# Patient Record
Sex: Male | Born: 1955
Health system: Southern US, Community
[De-identification: ages and names within clinical notes are randomized; demographics above are authoritative.]

## PROBLEM LIST (undated history)

## (undated) DIAGNOSIS — K219 Gastro-esophageal reflux disease without esophagitis: Secondary | ICD-10-CM

## (undated) DIAGNOSIS — I2510 Atherosclerotic heart disease of native coronary artery without angina pectoris: Secondary | ICD-10-CM

## (undated) DIAGNOSIS — E785 Hyperlipidemia, unspecified: Secondary | ICD-10-CM

## (undated) DIAGNOSIS — J329 Chronic sinusitis, unspecified: Secondary | ICD-10-CM

## (undated) DIAGNOSIS — B9689 Other specified bacterial agents as the cause of diseases classified elsewhere: Secondary | ICD-10-CM

## (undated) DIAGNOSIS — Z9889 Other specified postprocedural states: Secondary | ICD-10-CM

## (undated) DIAGNOSIS — T7840XA Allergy, unspecified, initial encounter: Secondary | ICD-10-CM

## (undated) HISTORY — DX: Allergy, unspecified, initial encounter: T78.40XA

## (undated) HISTORY — DX: Hyperlipidemia, unspecified: E78.5

## (undated) HISTORY — DX: Atherosclerotic heart disease of native coronary artery without angina pectoris: I25.10

## (undated) HISTORY — PX: SHOULDER SURGERY: SHX246

---

## 2001-07-13 ENCOUNTER — Encounter: Admission: RE | Admit: 2001-07-13 | Discharge: 2001-07-13 | Payer: Self-pay | Admitting: Internal Medicine

## 2001-07-13 ENCOUNTER — Encounter: Payer: Self-pay | Admitting: Internal Medicine

## 2001-11-09 ENCOUNTER — Ambulatory Visit (HOSPITAL_COMMUNITY): Admission: RE | Admit: 2001-11-09 | Discharge: 2001-11-09 | Payer: Self-pay | Admitting: Specialist

## 2006-10-04 ENCOUNTER — Ambulatory Visit: Payer: Self-pay | Admitting: Family Medicine

## 2006-10-04 LAB — CONVERTED CEMR LAB
ALT: 32 units/L (ref 0–40)
AST: 23 units/L (ref 0–37)
Chol/HDL Ratio, serum: 2.5
Cholesterol: 168 mg/dL (ref 0–200)
HDL: 68 mg/dL (ref >39.0)
LDL Cholesterol: 84 mg/dL (ref 0–99)
Triglyceride fasting, serum: 81 mg/dL (ref 0–149)
VLDL: 16 mg/dL (ref 0–40)

## 2007-12-17 ENCOUNTER — Ambulatory Visit: Payer: Self-pay | Admitting: Family Medicine

## 2007-12-17 DIAGNOSIS — J019 Acute sinusitis, unspecified: Secondary | ICD-10-CM | POA: Insufficient documentation

## 2007-12-21 ENCOUNTER — Ambulatory Visit: Payer: Self-pay | Admitting: Family Medicine

## 2007-12-24 ENCOUNTER — Ambulatory Visit: Payer: Self-pay | Admitting: Family Medicine

## 2007-12-24 ENCOUNTER — Encounter (INDEPENDENT_AMBULATORY_CARE_PROVIDER_SITE_OTHER): Payer: Self-pay | Admitting: *Deleted

## 2007-12-24 DIAGNOSIS — F528 Other sexual dysfunction not due to a substance or known physiological condition: Secondary | ICD-10-CM

## 2007-12-24 DIAGNOSIS — E785 Hyperlipidemia, unspecified: Secondary | ICD-10-CM | POA: Insufficient documentation

## 2007-12-24 LAB — CONVERTED CEMR LAB
ALT: 31 units/L (ref 0–53)
AST: 19 units/L (ref 0–37)
Cholesterol: 158 mg/dL (ref 0–200)
Glucose, Bld: 93 mg/dL (ref 70–99)
HDL: 52 mg/dL (ref >39.0)
LDL Cholesterol: 91 mg/dL (ref 0–99)
PSA: 0.84 ng/mL (ref 0.10–4.00)
Total CHOL/HDL Ratio: 3
Triglycerides: 77 mg/dL (ref 0–149)
VLDL: 15 mg/dL (ref 0–40)

## 2008-07-31 ENCOUNTER — Telehealth: Payer: Self-pay | Admitting: Family Medicine

## 2008-12-26 ENCOUNTER — Ambulatory Visit: Payer: Self-pay | Admitting: Family Medicine

## 2009-03-06 ENCOUNTER — Ambulatory Visit: Payer: Self-pay | Admitting: Family Medicine

## 2009-03-06 LAB — CONVERTED CEMR LAB
ALT: 41 units/L (ref 0–53)
AST: 25 units/L (ref 0–37)
Albumin: 4.1 g/dL (ref 3.5–5.2)
Alkaline Phosphatase: 61 units/L (ref 39–117)
BUN: 22 mg/dL (ref 6–23)
Basophils Absolute: 0 10*3/uL (ref 0.0–0.1)
Basophils Relative: 0.4 % (ref 0.0–3.0)
Bilirubin, Direct: 0 mg/dL (ref 0.0–0.3)
CO2: 30 meq/L (ref 19–32)
Calcium: 9.3 mg/dL (ref 8.4–10.5)
Chloride: 106 meq/L (ref 96–112)
Cholesterol: 253 mg/dL — ABNORMAL HIGH (ref 0–200)
Creatinine, Ser: 1.1 mg/dL (ref 0.4–1.5)
Direct LDL: 164.5 mg/dL
Eosinophils Absolute: 0.1 10*3/uL (ref 0.0–0.7)
Eosinophils Relative: 3.1 % (ref 0.0–5.0)
GFR calc non Af Amer: 75.12 mL/min (ref >60)
Glucose, Bld: 94 mg/dL (ref 70–99)
HCT: 43.1 % (ref 39.0–52.0)
HDL: 66 mg/dL (ref >39.00)
Hemoglobin: 14.7 g/dL (ref 13.0–17.0)
Lymphocytes Relative: 36.2 % (ref 12.0–46.0)
Lymphs Abs: 1.4 10*3/uL (ref 0.7–4.0)
MCHC: 34.1 g/dL (ref 30.0–36.0)
MCV: 90.7 fL (ref 78.0–100.0)
Monocytes Absolute: 0.4 10*3/uL (ref 0.1–1.0)
Monocytes Relative: 9.6 % (ref 3.0–12.0)
Neutro Abs: 1.9 10*3/uL (ref 1.4–7.7)
Neutrophils Relative %: 50.7 % (ref 43.0–77.0)
PSA: 0.72 ng/mL (ref 0.10–4.00)
Platelets: 231 10*3/uL (ref 150.0–400.0)
Potassium: 4.7 meq/L (ref 3.5–5.1)
RBC: 4.75 M/uL (ref 4.22–5.81)
RDW: 11.8 % (ref 11.5–14.6)
Sodium: 141 meq/L (ref 135–145)
TSH: 1.41 microintl units/mL (ref 0.35–5.50)
Total Bilirubin: 1 mg/dL (ref 0.3–1.2)
Total CHOL/HDL Ratio: 4
Total Protein: 6.9 g/dL (ref 6.0–8.3)
Triglycerides: 94 mg/dL (ref 0.0–149.0)
VLDL: 18.8 mg/dL (ref 0.0–40.0)
WBC: 3.8 10*3/uL — ABNORMAL LOW (ref 4.5–10.5)

## 2009-03-09 ENCOUNTER — Encounter (INDEPENDENT_AMBULATORY_CARE_PROVIDER_SITE_OTHER): Payer: Self-pay | Admitting: *Deleted

## 2009-03-09 ENCOUNTER — Telehealth (INDEPENDENT_AMBULATORY_CARE_PROVIDER_SITE_OTHER): Payer: Self-pay | Admitting: *Deleted

## 2009-03-23 ENCOUNTER — Ambulatory Visit: Payer: Self-pay | Admitting: Internal Medicine

## 2009-04-08 ENCOUNTER — Ambulatory Visit: Payer: Self-pay | Admitting: Internal Medicine

## 2009-04-08 HISTORY — PX: COLONOSCOPY: SHX174

## 2009-11-26 ENCOUNTER — Ambulatory Visit: Payer: Self-pay | Admitting: Family Medicine

## 2010-04-27 ENCOUNTER — Telehealth (INDEPENDENT_AMBULATORY_CARE_PROVIDER_SITE_OTHER): Payer: Self-pay | Admitting: *Deleted

## 2010-05-07 ENCOUNTER — Ambulatory Visit: Payer: Self-pay | Admitting: Family Medicine

## 2010-05-10 LAB — CONVERTED CEMR LAB
ALT: 41 units/L (ref 0–53)
AST: 25 units/L (ref 0–37)
Albumin: 4 g/dL (ref 3.5–5.2)
Alkaline Phosphatase: 58 units/L (ref 39–117)
BUN: 21 mg/dL (ref 6–23)
Basophils Absolute: 0 10*3/uL (ref 0.0–0.1)
Basophils Relative: 0.5 % (ref 0.0–3.0)
Bilirubin, Direct: 0.1 mg/dL (ref 0.0–0.3)
CO2: 32 meq/L (ref 19–32)
Calcium: 9.5 mg/dL (ref 8.4–10.5)
Chloride: 108 meq/L (ref 96–112)
Cholesterol: 225 mg/dL — ABNORMAL HIGH (ref 0–200)
Creatinine, Ser: 1.2 mg/dL (ref 0.4–1.5)
Direct LDL: 132.2 mg/dL
Eosinophils Absolute: 0.1 10*3/uL (ref 0.0–0.7)
Eosinophils Relative: 1.5 % (ref 0.0–5.0)
GFR calc non Af Amer: 71.04 mL/min (ref >60)
Glucose, Bld: 99 mg/dL (ref 70–99)
HCT: 41.9 % (ref 39.0–52.0)
HDL: 78.3 mg/dL (ref >39.00)
Hemoglobin: 14.6 g/dL (ref 13.0–17.0)
Lymphocytes Relative: 26.2 % (ref 12.0–46.0)
Lymphs Abs: 1.5 10*3/uL (ref 0.7–4.0)
MCHC: 34.8 g/dL (ref 30.0–36.0)
MCV: 90.6 fL (ref 78.0–100.0)
Monocytes Absolute: 0.3 10*3/uL (ref 0.1–1.0)
Monocytes Relative: 6.2 % (ref 3.0–12.0)
Neutro Abs: 3.7 10*3/uL (ref 1.4–7.7)
Neutrophils Relative %: 65.6 % (ref 43.0–77.0)
PSA: 0.81 ng/mL (ref 0.10–4.00)
Platelets: 245 10*3/uL (ref 150.0–400.0)
Potassium: 5.1 meq/L (ref 3.5–5.1)
RBC: 4.63 M/uL (ref 4.22–5.81)
RDW: 13.1 % (ref 11.5–14.6)
Sodium: 144 meq/L (ref 135–145)
TSH: 1.59 microintl units/mL (ref 0.35–5.50)
Total Bilirubin: 0.5 mg/dL (ref 0.3–1.2)
Total CHOL/HDL Ratio: 3
Total Protein: 6.6 g/dL (ref 6.0–8.3)
Triglycerides: 95 mg/dL (ref 0.0–149.0)
VLDL: 19 mg/dL (ref 0.0–40.0)
WBC: 5.6 10*3/uL (ref 4.5–10.5)

## 2010-05-17 ENCOUNTER — Telehealth (INDEPENDENT_AMBULATORY_CARE_PROVIDER_SITE_OTHER): Payer: Self-pay | Admitting: *Deleted

## 2010-11-30 ENCOUNTER — Telehealth (INDEPENDENT_AMBULATORY_CARE_PROVIDER_SITE_OTHER): Payer: Self-pay | Admitting: *Deleted

## 2010-12-03 ENCOUNTER — Other Ambulatory Visit: Payer: Self-pay | Admitting: Family Medicine

## 2010-12-03 ENCOUNTER — Ambulatory Visit
Admission: RE | Admit: 2010-12-03 | Discharge: 2010-12-03 | Payer: Self-pay | Source: Home / Self Care | Attending: Family Medicine | Admitting: Family Medicine

## 2010-12-03 LAB — LIPID PANEL
Cholesterol: 192 mg/dL (ref 0–200)
HDL: 69.5 mg/dL (ref >39.00)
LDL Cholesterol: 109 mg/dL — ABNORMAL HIGH (ref 0–99)
Total CHOL/HDL Ratio: 3
Triglycerides: 68 mg/dL (ref 0.0–149.0)
VLDL: 13.6 mg/dL (ref 0.0–40.0)

## 2010-12-28 NOTE — Progress Notes (Signed)
Summary: CALL TO EXPLAIN LAB NUMBERS FROM 6/10  Phone Note Call from Patient Call back at Work Phone [phone]   Caller: Patient Summary of Call: PLEASE CALL PATIENT TO GO OVER HIS LAB WORK DATED 6/10----HE DOESNT UNDERSTAND WHAT THE NUMBERS MEAN Initial call taken by: Jerolyn Shin,  May 17, 2010 10:08 AM  Follow-up for Phone Call        spoke w/ patient explained copy of labs.....Marland KitchenMarland KitchenDoristine Devoid  May 18, 2010 1:16 PM     Prescriptions: SIMVASTATIN 20 MG TABS (SIMVASTATIN) take one tablet at bedtime  #90 x 1   Entered by:   Doristine Devoid   Authorized by:   Neena Rhymes MD   Signed by:   Doristine Devoid on 05/18/2010   Method used:   Faxed to ...       [facility] (mail-order)       [address] [place], [place]  [address]       Ph: [phone]       Fax: [phone]   RxID:   [id number]

## 2010-12-28 NOTE — Assessment & Plan Note (Signed)
Summary: CHOLESTEROL RECHECK/CDJ   Vital Signs:  Patient profile:   55 year old male Height:      70.50 inches Weight:      188 pounds BMI:     26.69 Pulse rate:   78 / minute BP sitting:   130 / 80  (left arm)  Vitals Entered By: Doristine Devoid (May 07, 2010 9:24 AM) CC: roa and lab   History of Present Illness: 55 yo man here today for f/u on  1) cholesterol- taking Simvastatin w/out any N/V, abd pain, myalgias.  exercising, has lost 8 lbs since 12/10.  not following any particular diet.  overdue for CPE- will collect labs.  Current Medications (verified): 1)  Simvastatin 20 Mg Tabs (Simvastatin) .... Take One Tablet At Bedtime 2)  Adprin B 325 Mg  Tabs (Aspirin Buf(Cacarb-Mgcarb-Mgo)) .... Daily 3)  Fish Oil Concentrate 300 Mg  Caps (Omega-3 Fatty Acids) 4)  Flonase 50 Mcg/act Susp (Fluticasone Propionate) .... Use 2 Sprays in Each Nostril Daily  Allergies (verified): No Known Drug Allergies  Past History:  Social History: Last updated: 05/07/2010 Occupation: Airline pilot Married Never Smoked Alcohol use-yes Drug use-no Regular exercise-yes: plays tennis regularly  Social History: Occupation: Airline pilot Married Never Smoked Alcohol use-yes Drug use-no Regular exercise-yes: plays tennis regularly  Review of Systems      See HPI  Physical Exam  General:  Well-developed,well-nourished,in no acute distress; alert,appropriate and cooperative throughout examination Lungs:  Normal respiratory effort, chest expands symmetrically. Lungs are clear to auscultation, no crackles or wheezes. Heart:  Normal rate and regular rhythm. S1 and S2 normal without gallop, murmur, click, rub or other extra sounds. Extremities:  No clubbing, cyanosis, edema   Impression & Recommendations:  Problem # 1:  HYPERLIPIDEMIA (ICD-272.4) Assessment Unchanged due for labs.  has lost weight, exercising regularly.  adjust meds as needed. His updated medication list for this problem includes:   Simvastatin 20 Mg Tabs (Simvastatin) .Marland Kitchen... Take one tablet at bedtime  Orders: Venipuncture (16109) TLB-Lipid Panel (80061-LIPID) TLB-Hepatic/Liver Function Pnl (80076-HEPATIC)  Problem # 2:  PREVENTIVE HEALTH CARE (ICD-V70.0) Assessment: Unchanged overdue for CPE.  collect labs for upcoming appt Orders: TLB-BMP (Basic Metabolic Panel-BMET) (80048-METABOL) TLB-CBC Platelet - w/Differential (85025-CBCD) TLB-TSH (Thyroid Stimulating Hormone) (84443-TSH) TLB-PSA (Prostate Specific Antigen) (84153-PSA)  Complete Medication List: 1)  Simvastatin 20 Mg Tabs (Simvastatin) .... Take one tablet at bedtime 2)  Adprin B 325 Mg Tabs (Aspirin buf(cacarb-mgcarb-mgo)) .... Daily 3)  Fish Oil Concentrate 300 Mg Caps (Omega-3 fatty acids) 4)  Flonase 50 Mcg/act Susp (Fluticasone propionate) .... Use 2 sprays in each nostril daily  Patient Instructions: 1)  Please schedule your complete physical at your convenience- you can eat before this appt since we have your labs 2)  We'll notify you of your labs 3)  Call with any questions or concerns 4)  Enjoy your summer!

## 2010-12-28 NOTE — Progress Notes (Signed)
Summary: 2 PRESCRIPTIONS FOR CRESTOR--30 DAY AND 90 DAY  Phone Note Call from Patient Call back at CELL - [phone]   Caller: Patient Summary of Call: PATIENT BROUGHT IN [facility] FORM FOR HIS CRESTOR FOR A 90 DAY PRESCRIPTION PLUS REFILLS  PATIENT WILL TAKE HIS LAST PILL TODAY---CAN HE GET A 30 DAY PRESCRIPTION CALLED INTO THE [facility] ON [address]?  PLEASE CALL HIM ON CELL [phone] AND LEAVE MESSAGE WHEN 30 DAY LOCAL SUPPLY HAS BEEN CALLED IN  WILL TAKE FORM TO CHEMIRA IN PLASTIC SLEEVE  Initial call taken by: Jerolyn Shin,  Apr 27, 2010 10:39 AM  Follow-up for Phone Call        spoke w/ patient aware prescription to be called into pharmacy informed that he is due for f/u on cholesterol and also need fasting labs and cpx but patient would like to just schedule appt to recheck labs and come back at another date for cpx........Marland KitchenDoristine Devoid  Apr 27, 2010 2:01 PM      Appended Document: 2 PRESCRIPTIONS FOR CRESTOR--30 DAY AND 90 DAY    Clinical Lists Changes  Medications: Rx of SIMVASTATIN 20 MG TABS (SIMVASTATIN) take one tablet at bedtime;  #30 x 0;  Signed;  Entered by: Doristine Devoid;  Authorized by: Neena Rhymes MD;  Method used: Electronically to [facility] [phone]*, [address] [place], [place]  [address], Ph: [phone], Fax: [phone]    Prescriptions: SIMVASTATIN 20 MG TABS (SIMVASTATIN) take one tablet at bedtime  #30 x 0   Entered by:   Doristine Devoid   Authorized by:   Neena Rhymes MD   Signed by:   Doristine Devoid on 04/27/2010   Method used:   Electronically to        [facility] [phone]* (retail)       [address]       [place], [place]  [address]       Ph: [phone]       Fax: [phone]   RxID:   [id number]

## 2010-12-30 NOTE — Progress Notes (Signed)
Summary: Refill Request  Phone Note Refill Request Message from:  Patient on November 30, 2010 1:51 PM  Refills Requested: Medication #1:  SIMVASTATIN 20 MG TABS take one tablet at bedtime   Dosage confirmed as above?Dosage Confirmed   Supply Requested: 1 month [facility] on [address]   Next Appointment Scheduled: none Initial call taken by: Harold Barban,  November 30, 2010 1:51 PM    Prescriptions: SIMVASTATIN 20 MG TABS (SIMVASTATIN) take one tablet at bedtime  #90 x 1   Entered by:   Doristine Devoid CMA   Authorized by:   Neena Rhymes MD   Signed by:   Doristine Devoid CMA on 11/30/2010   Method used:   Electronically to        [facility] [phone]* (retail)       [address]       [place], [place]  [address]       Ph: [phone]       Fax: [phone]   RxID:   [id number]

## 2011-01-28 ENCOUNTER — Encounter (INDEPENDENT_AMBULATORY_CARE_PROVIDER_SITE_OTHER): Payer: BC Managed Care – PPO | Admitting: Family Medicine

## 2011-01-28 ENCOUNTER — Other Ambulatory Visit: Payer: Self-pay | Admitting: Family Medicine

## 2011-01-28 ENCOUNTER — Encounter: Payer: Self-pay | Admitting: Family Medicine

## 2011-01-28 DIAGNOSIS — Z0000 Encounter for general adult medical examination without abnormal findings: Secondary | ICD-10-CM

## 2011-01-28 LAB — BASIC METABOLIC PANEL
BUN: 21 mg/dL (ref 6–23)
GFR: 70.83 mL/min (ref >60.00)
Potassium: 4.9 mEq/L (ref 3.5–5.1)
Sodium: 141 mEq/L (ref 135–145)

## 2011-01-28 LAB — CBC WITH DIFFERENTIAL/PLATELET
Eosinophils Relative: 2.8 % (ref 0.0–5.0)
HCT: 41.9 % (ref 39.0–52.0)
Lymphs Abs: 1.4 10*3/uL (ref 0.7–4.0)
Monocytes Relative: 7.6 % (ref 3.0–12.0)
Platelets: 251 10*3/uL (ref 150.0–400.0)
RBC: 4.71 Mil/uL (ref 4.22–5.81)
WBC: 4.5 10*3/uL (ref 4.5–10.5)

## 2011-01-28 LAB — TSH: TSH: 1.67 u[IU]/mL (ref 0.35–5.50)

## 2011-01-28 LAB — PSA: PSA: 0.82 ng/mL (ref 0.10–4.00)

## 2011-02-01 ENCOUNTER — Encounter: Payer: Self-pay | Admitting: Family Medicine

## 2011-02-08 NOTE — Assessment & Plan Note (Signed)
Summary: cpe/kn   Vital Signs:  Patient profile:   55 year old male Height:      70.50 inches (179.07 cm) Weight:      197.50 pounds (89.77 kg) BMI:     28.04 Temp:     98.1 degrees F (36.72 degrees C) oral BP sitting:   112 / 78  (left arm) Cuff size:   regular  Vitals Entered By: Lucious Groves CMA (January 28, 2011 8:35 AM) CC: Fasting CPX./kb Is Patient Diabetic? No Pain Assessment Patient in pain? no      Comments Patient denies having any questions or concerns.   History of Present Illness: 55 yo man here today for CPE.  no concerns.  UTD on colonoscopy.  Preventive Screening-Counseling & Management  Alcohol-Tobacco     Alcohol drinks/day: <1     Smoking Status: never  Caffeine-Diet-Exercise     Does Patient Exercise: yes     Type of exercise: tennis, running      Sexual History:  currently monogamous.        Drug Use:  never.    Current Medications (verified): 1)  Simvastatin 20 Mg Tabs (Simvastatin) .... Take One Tablet At Bedtime 2)  Adprin B 325 Mg  Tabs (Aspirin Buf(Cacarb-Mgcarb-Mgo)) .... Daily 3)  Fish Oil Concentrate 300 Mg  Caps (Omega-3 Fatty Acids) 4)  Flonase 50 Mcg/act Susp (Fluticasone Propionate) .... Use 2 Sprays in Each Nostril Daily  Allergies (verified): No Known Drug Allergies  Past History:  Past medical, surgical, family and social histories (including risk factors) reviewed, and no changes noted (except as noted below).  Past Medical History: Reviewed history from 12/24/2007 and no changes required. Hyperlipidemia Urinary incontinence  Past Surgical History: Reviewed history from 12/24/2007 and no changes required. right shoulder surgery  Family History: Reviewed history from 12/24/2007 and no changes required. CAD: father  MI at 25: patient had a stress test at 37 alzheimer Colon Cancer- none Prostate Cancer- none DM- none  Social History: Reviewed history from 05/07/2010 and no changes required. 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  The patient denies anorexia, fever, weight loss, weight gain, vision loss, decreased hearing, hoarseness, chest pain, syncope, dyspnea on exertion, peripheral edema, prolonged cough, headaches, abdominal pain, melena, hematochezia, severe indigestion/heartburn, hematuria, suspicious skin lesions, depression, enlarged lymph nodes, and testicular masses.         frequent nose bleeds- has scab in L nostril  Physical Exam  General:  Well-developed,well-nourished,in no acute distress; alert,appropriate and cooperative throughout examination Head:  Normocephalic and atraumatic without obvious abnormalities. Eyes:  No corneal or conjunctival inflammation noted. EOMI. Perrla. Funduscopic exam benign, without hemorrhages, exudates or papilledema. Vision grossly normal. Ears:  External ear exam shows no significant lesions or deformities.  Otoscopic examination reveals clear canals, tympanic membranes are intact bilaterally without bulging, retraction, inflammation or discharge. Hearing is grossly normal bilaterally. Nose:  scab in anterior L nostril along medial wall Mouth:  Oral mucosa and oropharynx without lesions or exudates.  Teeth in good repair. Neck:  No deformities, masses, or tenderness noted. Lungs:  Normal respiratory effort, chest expands symmetrically. Lungs are clear to auscultation, no crackles or wheezes. Heart:  Normal rate and regular rhythm. S1 and S2 normal without gallop, murmur, click, rub or other extra sounds. Abdomen:  Bowel sounds positive,abdomen soft and non-tender without masses, organomegaly or  hernias noted. Rectal:  + external hemorrhoids. Normal sphincter tone. No rectal masses or tenderness. Genitalia:  Testes bilaterally descended without nodularity, tenderness or masses. No scrotal masses or  lesions. No penis lesions or urethral discharge. Prostate:  Prostate gland firm and smooth, no enlargement, nodularity, tenderness, mass, asymmetry or induration. Msk:  No deformity or scoliosis noted of thoracic or lumbar spine.   Pulses:  +2 carotid, radial, DP Extremities:  No clubbing, cyanosis, edema Neurologic:  No cranial nerve deficits noted. Station and gait are normal. Plantar reflexes are down-going bilaterally. DTRs are symmetrical throughout. Sensory, motor and coordinative functions appear intact. Skin:  Intact without suspicious lesions or rashes Cervical Nodes:  No lymphadenopathy noted Inguinal Nodes:  No significant adenopathy Psych:  Cognition and judgment appear intact. Alert and cooperative with normal attention span and concentration. No apparent delusions, illusions, hallucinations   Impression & Recommendations:  Problem # 1:  PREVENTIVE HEALTH CARE (ICD-V70.0) Assessment Unchanged pt's PE WNL.  UTD on health maintainence.  EKG done as baseline.  check labs.  anticipatory guidance provided. Orders: Venipuncture (52841) EKG w/ Interpretation (93000) Specimen Handling (32440) TLB-BMP (Basic Metabolic Panel-BMET) (80048-METABOL) TLB-CBC Platelet - w/Differential (85025-CBCD) TLB-TSH (Thyroid Stimulating Hormone) (84443-TSH) TLB-PSA (Prostate Specific Antigen) (84153-PSA)  Complete Medication List: 1)  Simvastatin 20 Mg Tabs (Simvastatin) .... Take one tablet at bedtime 2)  Adprin B 325 Mg Tabs (Aspirin buf(cacarb-mgcarb-mgo)) .... Daily 3)  Fish Oil Concentrate 300 Mg Caps (Omega-3 fatty acids) 4)  Flonase 50 Mcg/act Susp (Fluticasone propionate) .... Use 2 sprays in each nostril daily  Patient Instructions: 1)  Schedule a lab visit in 6 months to recheck cholesterol 2)  We'll notify you of your lab results 3)  Call with any questions or concerns 4)  Your exam looks great! 5)  Have a great weekend! Prescriptions: FLONASE 50 MCG/ACT SUSP (FLUTICASONE  PROPIONATE) Use 2 sprays in each nostril daily  #1 x 5   Entered by:   Lucious Groves CMA   Authorized by:   Neena Rhymes MD   Signed by:   Lucious Groves CMA on 01/28/2011   Method used:   Electronically to        [facility] [address] [phone]* (retail)       [address]       [place], [place]  [address]       Ph: [phone]       Fax: [phone]   RxID:   [id number]    Orders Added: 1)  Venipuncture [10932] 2)  EKG w/ Interpretation [93000] 3)  Specimen Handling [99000] 4)  TLB-BMP (Basic Metabolic Panel-BMET) [80048-METABOL] 5)  TLB-CBC Platelet - w/Differential [85025-CBCD] 6)  TLB-TSH (Thyroid Stimulating Hormone) [84443-TSH] 7)  TLB-PSA (Prostate Specific Antigen) [84153-PSA] 8)  Est. Patient 40-64 years [35573]

## 2011-04-15 NOTE — Op Note (Signed)
[facility]  Patient:    Dan Sanchez, Dan Sanchez Visit Number: [id number] MRN: [id number]          Service Type: DSU Location: DAY Attending Physician:  Erasmo Leventhal Dictated by:   Elvera Lennox Valma Cava, M.D. Proc. Date: 11/09/01 Admit Date:  11/09/2001 Discharge Date: 11/09/2001                             Operative Report  PREOPERATIVE DIAGNOSES:  Right shoulder impingement syndrome, possible rotator cuff tear, symptomatic acromioclavicular joint, possible labrum tear.  POSTOPERATIVE DIAGNOSES:  Right shoulder degenerative tear of glenoid labrum, congenital Buford complex, partial thickness rotator cuff tear with rotator cuff impingement syndrome, symptomatic degenerative acromioclavicular joint.  PROCEDURES:  Right shoulder arthroscopic debridement of labrum tear, debridement of partial rotator cuff tear, arthroscopic assisted subacromial decompression, arthroscopic distal clavicle resection Mumford procedure.  SURGEON:  R. Valma Cava, M.D.  ASSISTANT:  French Ana Shuford, P.A.-C  ANESTHESIA:  Preoperative scalene block and general.  ESTIMATED BLOOD LOSS:  Less than 10 cc.  DRAINS:  None.  COMPLICATIONS:  None.  DISPOSITION:  To the PACU stable.  OPERATIVE DETAILS:  The patient and his wife were counseled in the holding area.  An IV was started, antibiotics were given and block was administered. Patient taken to the OR and placed in the supine position under general anesthesia. Patient placed into a left lateral decubitus position, properly padded and bumped throughout. The right shoulder examined and full range of motion is stable.  Prepped with DuraPrep and draped in a sterile fashion. Standard overhead shoulder position was utilized at 30 degrees of abduction, 10 degrees of forward flexion, and 15 pounds of longitudinal traction.  A posterior portal was created and arthroscope was placed into the glenohumeral joint.  Immediately  identifiable was a Buford complex, which I felt was congenital.  There was a little bit of glenoid chondromalacia, but not bad. Shoulder was stable.  Rotator cuff was inspected and showed an undersurface tearing of the supraspinatus just posterior to the biceps tendon.  In addition, there was some degenerative labral tears superiorly and posteriorly, but the labrum was not detached and the biceps labral anchor was stable.  Anterior portal was created from the outside in technique to the rotator cuff interval.  Motorized shaver was introduced and the labral tear was debrided back to healthy tissue.  The rotator cuff was then debrided on the articular surface back to healthy tissue.  The remaining shoulder was reinspected and there were no other abnormalities noted and after copious irrigation all arthroscopic equipment was removed from the shoulder.  Arthroscope was placed in the subacromial region where very thickened, inflammed subacromial bursa was encountered. An anterolateral portal was created staying well proximal to the axillary nerve.  Motorized shaver was introduced and an extensive subacromial bursectomy was performed.  Rotator cuff was inspected on the bursal surface and found to have fraying of the supraspinatus, but felt that this was less than a 50% thickness tear. Utilizing the ArthroCare system the periosteum was released from the undersurface of the acromion.  The CA ligament was released and hemostasis was obtained.  A bur was then placed posteriorly then anterior-inferior acromioplasty was performed and removed an amount of bone based upon preoperative x-rays and calculations, converting acromion from a type 2 to a type 1 acromion morphology.  Attention was directed to the distal clavicle.  It was found to be markedly osteoarthritic with large  underlying subacromial spur formation.  A bur was then placed from anterior the [facility] joint and the lateral 1.5 cm of clavicle  was removed for the arthroscopic distal clavicle resection multiple procedures. The clavicle capsule was left intact for stability.  It was palpated and found to be stable.  A meticulous hemostasis was performed and all the debris was removed from the subacromial region.  The area was again reinspected.  There were no other abnormalities noted and after another copious irrigation, the arthroscopic equipment was removed.  Taken out of traction, normal pulses of the hand at the end of the case. Portals were closed with 4-0 nylon suture.  Another 15 cc of 1/2% Marcaine with epinephrine was injected into the portal sites of the subacromial region after getting approval from anesthesia.  He was also given another gram of Ancef intravenously at the end of the case.  A sterile dressing was applied to the shoulder and he was turned supine, awakened, and extubated.  He was taken from the operating room and taken in stable condition in a sling. There were no complications.  Sponge and needle counts were correct.  PLAN:  Stabilization in the PACU and then discharged to home when stable. Dictated by:   R. Valma Cava, M.D. Attending Physician:  Erasmo Leventhal DD:  11/09/01 TD:  11/10/01 Job: [id number]

## 2011-06-06 ENCOUNTER — Other Ambulatory Visit: Payer: Self-pay | Admitting: Family Medicine

## 2011-06-06 MED ORDER — SIMVASTATIN 40 MG PO TABS: 40.0000 mg | ORAL_TABLET | Freq: Every day | ORAL | Status: DC

## 2011-06-06 NOTE — Telephone Encounter (Signed)
Needs refill simvastatin - [facility] [address]

## 2011-06-06 NOTE — Telephone Encounter (Signed)
Patient says this prescription "started" two weeks ago--needs it to go through so he can pick it up when he gets back in town

## 2011-06-06 NOTE — Telephone Encounter (Signed)
Refill sent, pt aware. 

## 2011-10-17 ENCOUNTER — Other Ambulatory Visit: Payer: Self-pay | Admitting: Family Medicine

## 2011-10-17 DIAGNOSIS — E785 Hyperlipidemia, unspecified: Secondary | ICD-10-CM

## 2011-10-18 ENCOUNTER — Other Ambulatory Visit (INDEPENDENT_AMBULATORY_CARE_PROVIDER_SITE_OTHER): Payer: BC Managed Care – PPO

## 2011-10-18 DIAGNOSIS — E785 Hyperlipidemia, unspecified: Secondary | ICD-10-CM

## 2011-10-18 LAB — LIPID PANEL
HDL: 87.4 mg/dL (ref >39.00)
LDL Cholesterol: 88 mg/dL (ref 0–99)
Total CHOL/HDL Ratio: 2
VLDL: 24 mg/dL (ref 0.0–40.0)

## 2011-10-18 NOTE — Progress Notes (Signed)
12  

## 2011-10-24 ENCOUNTER — Encounter: Payer: Self-pay | Admitting: *Deleted

## 2011-10-28 ENCOUNTER — Encounter: Payer: Self-pay | Admitting: Internal Medicine

## 2011-10-28 ENCOUNTER — Ambulatory Visit (INDEPENDENT_AMBULATORY_CARE_PROVIDER_SITE_OTHER): Payer: BC Managed Care – PPO | Admitting: Internal Medicine

## 2011-10-28 VITALS — BP 124/80 | HR 91 | Temp 98.4°F | Ht 70.0 in | Wt 203.2 lb

## 2011-10-28 DIAGNOSIS — L732 Hidradenitis suppurativa: Secondary | ICD-10-CM

## 2011-10-28 MED ORDER — DOXYCYCLINE HYCLATE 100 MG PO TABS: 100.0000 mg | ORAL_TABLET | Freq: Two times a day (BID) | ORAL | Status: AC

## 2011-10-28 NOTE — Progress Notes (Signed)
  Subjective:    Patient ID: Dan Sanchez, male    DOB: 09-16-1956, 55 y.o.   MRN: [id number]  HPI Noticed a "knot" at R armpit 2 days ago. Area is sore, has not increase in size since  Past Medical History: Hyperlipidemia Urinary incontinence  Past Surgical History: right shoulder surgery  Review of Systems No f/c No d/c Son had a boil last week, eventually dx w/  MRSA    Objective:   Physical Exam  Constitutional: He appears well-developed and well-nourished.  HENT:  Head: Normocephalic and atraumatic.  Musculoskeletal: He exhibits no edema.  Lymphadenopathy:    He has no cervical adenopathy.  Skin:       L armpoit normal. R armpit: has a 1 cm soft , tender mass, not attached to deeper structures, no fluctuant. Skin over mass wnl except for mild redness.      Assessment & Plan:  Hydradenitis: See instructions. Instructed to call if area not completely well in 2-3 weeks

## 2011-10-28 NOTE — Patient Instructions (Signed)
Doxycycline Warm compress Call if area gets worse, more red or larger Call if area is not completely back to normal in 2 weeks

## 2011-12-12 ENCOUNTER — Other Ambulatory Visit: Payer: Self-pay | Admitting: Family Medicine

## 2011-12-12 MED ORDER — SIMVASTATIN 40 MG PO TABS: 40.0000 mg | ORAL_TABLET | Freq: Every day | ORAL | Status: DC

## 2011-12-12 NOTE — Telephone Encounter (Signed)
Called pt to advise the zocor has been filled at pharmacy noted in chart,left message

## 2011-12-12 NOTE — Telephone Encounter (Signed)
rx sent to pharmacy by e-script  

## 2011-12-23 ENCOUNTER — Ambulatory Visit (INDEPENDENT_AMBULATORY_CARE_PROVIDER_SITE_OTHER): Payer: BC Managed Care – PPO | Admitting: Family Medicine

## 2011-12-23 ENCOUNTER — Encounter: Payer: Self-pay | Admitting: Family Medicine

## 2011-12-23 VITALS — BP 125/80 | HR 82 | Temp 98.2°F | Ht 70.75 in | Wt 203.4 lb

## 2011-12-23 DIAGNOSIS — J329 Chronic sinusitis, unspecified: Secondary | ICD-10-CM

## 2011-12-23 MED ORDER — AMOXICILLIN 875 MG PO TABS: 875.0000 mg | ORAL_TABLET | Freq: Two times a day (BID) | ORAL | Status: DC

## 2011-12-23 NOTE — Patient Instructions (Signed)
This is a sinus infection Start the Amox twice daily- take w/ food to avoid upset stomach Drink plenty of fluids REST! Hang in there!!!

## 2011-12-23 NOTE — Assessment & Plan Note (Signed)
[name] sxs and PE consistent w/ infxn.  Start abx.  Reviewed supportive care and red flags that should prompt return.  Pt expressed understanding and is in agreement w/ plan.  

## 2011-12-23 NOTE — Progress Notes (Signed)
  Subjective:    Patient ID: Dan Sanchez, male    DOB: [birth year], 56 y.o.   MRN: [id number]  HPI ? Sinus infxn- sxs started 2 weeks ago.  + facial pain, L>R.  Difficulty breathing at night, increased snoring.  Minimal cough.  No fevers or ear pain.  + sick contacts.   Review of Systems For ROS see HPI     Objective:   Physical Exam  Vitals reviewed. Constitutional: He appears well-developed and well-nourished. No distress.  HENT:  Head: Normocephalic and atraumatic.  Right Ear: Tympanic membrane normal.  Left Ear: Tympanic membrane normal.  Nose: Mucosal edema and rhinorrhea present. Right sinus exhibits maxillary sinus tenderness and frontal sinus tenderness. Left sinus exhibits maxillary sinus tenderness and frontal sinus tenderness.  Mouth/Throat: Mucous membranes are normal. Oropharyngeal exudate and posterior oropharyngeal erythema present. No posterior oropharyngeal edema.       + PND  Eyes: Conjunctivae and EOM are normal. Pupils are equal, round, and reactive to light.  Neck: Normal range of motion. Neck supple.  Cardiovascular: Normal rate, regular rhythm and normal heart sounds.   Pulmonary/Chest: Effort normal and breath sounds normal. No respiratory distress. He has no wheezes.       + hacking cough  Lymphadenopathy:    He has no cervical adenopathy.  Skin: Skin is warm and dry.          Assessment & Plan:

## 2012-01-13 ENCOUNTER — Ambulatory Visit (INDEPENDENT_AMBULATORY_CARE_PROVIDER_SITE_OTHER): Payer: BC Managed Care – PPO | Admitting: Family Medicine

## 2012-01-13 ENCOUNTER — Encounter: Payer: Self-pay | Admitting: Family Medicine

## 2012-01-13 VITALS — BP 122/82 | HR 90 | Temp 98.4°F | Ht 71.0 in | Wt 201.0 lb

## 2012-01-13 DIAGNOSIS — J3489 Other specified disorders of nose and nasal sinuses: Secondary | ICD-10-CM

## 2012-01-13 DIAGNOSIS — J340 Abscess, furuncle and carbuncle of nose: Secondary | ICD-10-CM | POA: Insufficient documentation

## 2012-01-13 MED ORDER — DOXYCYCLINE HYCLATE 100 MG PO TABS: 100.0000 mg | ORAL_TABLET | Freq: Two times a day (BID) | ORAL | Status: AC

## 2012-01-13 NOTE — Progress Notes (Signed)
  Subjective:    Patient ID: Dan Sanchez, male    DOB: 1956/03/26, 56 y.o.   MRN: [id number]  HPI Nasal pain/swelling- started on Tuesday as a 'small pimple' inside R nostril.  Pressed on tip of nose and fluid expressed.  Now nose is red and sore to touch.  No fevers.   Review of Systems For ROS see HPI     Objective:   Physical Exam  Vitals reviewed. HENT:  Nose: Sinus tenderness (over tip of nose) present. Right sinus exhibits no maxillary sinus tenderness and no frontal sinus tenderness. Left sinus exhibits no maxillary sinus tenderness and no frontal sinus tenderness.    Skin: Skin is warm and dry. There is erythema (over tip of nose).          Assessment & Plan:

## 2012-01-13 NOTE — Assessment & Plan Note (Signed)
New.  Pt w/ recent MRSA exposure.  Start Doxy.  No evidence of shingles.  Reviewed supportive care and red flags that should prompt return. Pt expressed understanding and is in agreement w/ plan.

## 2012-01-13 NOTE — Patient Instructions (Signed)
This is an infection Start the Doxy twice daily- take w/ food to avoid upset stomach ICE! If the redness continues to spread- call me! Ibuprofen for pain and inflammation Hang in there!

## 2012-02-06 ENCOUNTER — Ambulatory Visit (INDEPENDENT_AMBULATORY_CARE_PROVIDER_SITE_OTHER): Payer: BC Managed Care – PPO | Admitting: Family Medicine

## 2012-02-06 ENCOUNTER — Encounter: Payer: Self-pay | Admitting: Family Medicine

## 2012-02-06 VITALS — BP 122/81 | HR 71 | Temp 98.7°F | Ht 70.75 in | Wt 203.2 lb

## 2012-02-06 DIAGNOSIS — J329 Chronic sinusitis, unspecified: Secondary | ICD-10-CM

## 2012-02-06 MED ORDER — CLARITHROMYCIN ER 500 MG PO TB24: 1000.0000 mg | ORAL_TABLET | Freq: Every day | ORAL | Status: DC

## 2012-02-06 MED ORDER — GUAIFENESIN-CODEINE 100-10 MG/5ML PO SYRP: 10.0000 mL | ORAL_SOLUTION | Freq: Three times a day (TID) | ORAL | Status: DC | PRN

## 2012-02-06 NOTE — Patient Instructions (Signed)
This is a sinus infection Start the Biaxin- 2 tabs daily- w/ food Drink plenty of fluids Mucinex to thin your congestion Use the cough syrup as needed- it may make you drowsy Add Claritin or Zyrtec daily for seasonal allergies REST! Hang in there!!!

## 2012-02-06 NOTE — Progress Notes (Signed)
  Subjective:    Patient ID: Dan Sanchez, male    DOB: Jan 29, 1956, 56 y.o.   MRN: [id number]  HPI Sinusitis- sxs started 8 days ago.  + cough- 'i've been kicked out of my bed'.  + snoring b/c 'i can't breathe'.  + facial pressure.  Bilateral ear fullness.  No fevers.  Cough was initially productive but now dry- mostly at night.  + sick contacts.   Review of Systems For ROS see HPI     Objective:   Physical Exam  Vitals reviewed. Constitutional: He appears well-developed and well-nourished. No distress.  HENT:  Head: Normocephalic and atraumatic.  Right Ear: Tympanic membrane normal.  Left Ear: Tympanic membrane normal.  Nose: Mucosal edema and rhinorrhea present. Right sinus exhibits maxillary sinus tenderness and frontal sinus tenderness. Left sinus exhibits maxillary sinus tenderness and frontal sinus tenderness.  Mouth/Throat: Mucous membranes are normal. Oropharyngeal exudate and posterior oropharyngeal erythema present. No posterior oropharyngeal edema.       + PND  Eyes: Conjunctivae and EOM are normal. Pupils are equal, round, and reactive to light.  Neck: Normal range of motion. Neck supple.  Cardiovascular: Normal rate, regular rhythm and normal heart sounds.   Pulmonary/Chest: Effort normal and breath sounds normal. No respiratory distress. He has no wheezes.       + hacking cough  Lymphadenopathy:    He has no cervical adenopathy.  Skin: Skin is warm and dry.          Assessment & Plan:

## 2012-02-06 NOTE — Assessment & Plan Note (Signed)
[name] sxs and PE consistent w/ infxn.  Start abx.  Cough meds prn.  Reviewed supportive care and red flags that should prompt return.  Pt expressed understanding and is in agreement w/ plan.  

## 2012-03-06 ENCOUNTER — Encounter: Payer: BC Managed Care – PPO | Admitting: Family Medicine

## 2012-03-09 ENCOUNTER — Other Ambulatory Visit: Payer: Self-pay | Admitting: Family Medicine

## 2012-03-09 MED ORDER — SIMVASTATIN 40 MG PO TABS: 40.0000 mg | ORAL_TABLET | Freq: Every day | ORAL | Status: DC

## 2012-03-09 NOTE — Telephone Encounter (Signed)
Refill for Simvastatin 40MG  tablets Qty 90 Last filled 1.14.13 Take 1-tablet by mouth every night at bedtime Last OV 3.11.13

## 2012-03-09 NOTE — Telephone Encounter (Signed)
rx sent to pharmacy by e-script Per pt noted upcoming apt

## 2012-03-14 ENCOUNTER — Encounter: Payer: BC Managed Care – PPO | Admitting: Family Medicine

## 2012-03-16 ENCOUNTER — Encounter: Payer: BC Managed Care – PPO | Admitting: Family Medicine

## 2012-03-16 ENCOUNTER — Ambulatory Visit (INDEPENDENT_AMBULATORY_CARE_PROVIDER_SITE_OTHER): Payer: BC Managed Care – PPO | Admitting: Family Medicine

## 2012-03-16 ENCOUNTER — Telehealth: Payer: Self-pay | Admitting: Family Medicine

## 2012-03-16 ENCOUNTER — Encounter: Payer: Self-pay | Admitting: Family Medicine

## 2012-03-16 DIAGNOSIS — Z0000 Encounter for general adult medical examination without abnormal findings: Secondary | ICD-10-CM | POA: Insufficient documentation

## 2012-03-16 DIAGNOSIS — Z8249 Family history of ischemic heart disease and other diseases of the circulatory system: Secondary | ICD-10-CM

## 2012-03-16 DIAGNOSIS — E785 Hyperlipidemia, unspecified: Secondary | ICD-10-CM

## 2012-03-16 LAB — LIPID PANEL
HDL: 69.2 mg/dL (ref >39.00)
Total CHOL/HDL Ratio: 3
VLDL: 22 mg/dL (ref 0.0–40.0)

## 2012-03-16 LAB — CBC WITH DIFFERENTIAL/PLATELET
Basophils Absolute: 0 10*3/uL (ref 0.0–0.1)
Eosinophils Relative: 3 % (ref 0.0–5.0)
HCT: 41.9 % (ref 39.0–52.0)
Lymphocytes Relative: 35.4 % (ref 12.0–46.0)
Monocytes Relative: 7.5 % (ref 3.0–12.0)
Neutrophils Relative %: 53.1 % (ref 43.0–77.0)
Platelets: 268 10*3/uL (ref 150.0–400.0)
RDW: 13.5 % (ref 11.5–14.6)
WBC: 4.7 10*3/uL (ref 4.5–10.5)

## 2012-03-16 LAB — HEPATIC FUNCTION PANEL
AST: 31 U/L (ref 0–37)
Alkaline Phosphatase: 58 U/L (ref 39–117)
Bilirubin, Direct: 0 mg/dL (ref 0.0–0.3)
Total Bilirubin: 0.6 mg/dL (ref 0.3–1.2)

## 2012-03-16 LAB — BASIC METABOLIC PANEL
CO2: 27 mEq/L (ref 19–32)
Chloride: 107 mEq/L (ref 96–112)
Glucose, Bld: 96 mg/dL (ref 70–99)
Potassium: 4.4 mEq/L (ref 3.5–5.1)
Sodium: 143 mEq/L (ref 135–145)

## 2012-03-16 LAB — PSA: PSA: 1.16 ng/mL (ref 0.10–4.00)

## 2012-03-16 LAB — LDL CHOLESTEROL, DIRECT: Direct LDL: 120.9 mg/dL

## 2012-03-16 LAB — TSH: TSH: 1.34 u[IU]/mL (ref 0.35–5.50)

## 2012-03-16 MED ORDER — FLUTICASONE PROPIONATE 50 MCG/ACT NA SUSP: 2.0000 | Freq: Every day | NASAL | Status: DC

## 2012-03-16 NOTE — Telephone Encounter (Signed)
Refill done.  

## 2012-03-16 NOTE — Telephone Encounter (Signed)
Refill: Fluticasone 50 mcg nasal sp (120inh). Use 2 sprays in each nostril daily. Last fill 3.14.12

## 2012-03-16 NOTE — Progress Notes (Signed)
  Subjective:    Patient ID: Dan Sanchez, male    DOB: [birth year], 56 y.o.   MRN: [id number]  HPI CPE- no concerns.  UTD on colonoscopy.   Review of Systems Patient reports no vision/hearing changes, anorexia, fever ,adenopathy, persistant/recurrent hoarseness, swallowing issues, chest pain, palpitations, edema, persistant/recurrent cough, hemoptysis, dyspnea (rest,exertional, paroxysmal nocturnal), gastrointestinal  bleeding (melena, rectal bleeding), abdominal pain, excessive heart burn, GU symptoms (dysuria, hematuria, voiding/incontinence issues) syncope, focal weakness, memory loss, numbness & tingling, skin/hair/nail changes, depression, anxiety, abnormal bruising/bleeding, musculoskeletal symptoms/signs.     Objective:   Physical Exam BP 118/78  Pulse 72  Temp(Src) 98.2 F (36.8 C) (Oral)  Ht 5' 10.75" (1.797 m)  Wt 199 lb 12.8 oz (90.629 kg)  BMI 28.06 kg/m2  SpO2 98%  General Appearance:    Alert, cooperative, no distress, appears stated age  Head:    Normocephalic, without obvious abnormality, atraumatic  Eyes:    PERRL, conjunctiva/corneas clear, EOM's intact, fundi    benign, both eyes       Ears:    Normal TM's and external ear canals, both ears  Nose:   Nares normal, septum midline, mucosa normal, no drainage   or sinus tenderness  Throat:   Lips, mucosa, and tongue normal; teeth and gums normal  Neck:   Supple, symmetrical, trachea midline, no adenopathy;       thyroid:  No enlargement/tenderness/nodules  Back:     Symmetric, no curvature, ROM normal, no CVA tenderness  Lungs:     Clear to auscultation bilaterally, respirations unlabored  Chest wall:    No tenderness or deformity  Heart:    Regular rate and rhythm, S1 and S2 normal, no murmur, rub   or gallop  Abdomen:     Soft, non-tender, bowel sounds active all four quadrants,    no masses, no organomegaly  Genitalia:    Normal male without lesion, discharge or tenderness  Rectal:    Normal tone, normal  prostate, no masses or tenderness  Extremities:   Extremities normal, atraumatic, no cyanosis or edema  Pulses:   2+ and symmetric all extremities  Skin:   Skin color, texture, turgor normal, no rashes or lesions  Lymph nodes:   Cervical, supraclavicular, and axillary nodes normal  Neurologic:   CNII-XII intact. Normal strength, sensation and reflexes      throughout          Assessment & Plan:

## 2012-03-16 NOTE — Patient Instructions (Signed)
Follow up in 6 months to recheck cholesterol You look great!  Keep up the good work! We'll notify you of your lab results Call with any questions or concerns Happy Spring!

## 2012-03-18 NOTE — Assessment & Plan Note (Signed)
[name] PE WNL.  UTD on health maintenance.  Check labs.  Anticipatory guidance provided.  Discussed family hx of CAD.  Will ask cards what they recommend in regards to screening/work up.

## 2012-03-18 NOTE — Assessment & Plan Note (Signed)
Tolerating statin w/out difficulty.  Check labs.  Adjust meds prn. 

## 2012-04-06 ENCOUNTER — Telehealth: Payer: Self-pay | Admitting: *Deleted

## 2012-04-06 DIAGNOSIS — E785 Hyperlipidemia, unspecified: Secondary | ICD-10-CM

## 2012-04-06 DIAGNOSIS — Z8249 Family history of ischemic heart disease and other diseases of the circulatory system: Secondary | ICD-10-CM

## 2012-04-06 NOTE — Progress Notes (Signed)
Addended by: Derry Lory A on: 04/06/2012 08:49 AM   Modules accepted: Orders

## 2012-04-06 NOTE — Telephone Encounter (Signed)
Placed a new order in for pt to have a exercise tolerance test per MD Tabori orders, pt aware someone will call him about this apt

## 2012-04-16 ENCOUNTER — Encounter: Payer: Self-pay | Admitting: Nurse Practitioner

## 2012-04-16 ENCOUNTER — Encounter: Payer: Self-pay | Admitting: *Deleted

## 2012-04-16 ENCOUNTER — Ambulatory Visit (INDEPENDENT_AMBULATORY_CARE_PROVIDER_SITE_OTHER): Payer: BC Managed Care – PPO | Admitting: Physician Assistant

## 2012-04-16 DIAGNOSIS — E785 Hyperlipidemia, unspecified: Secondary | ICD-10-CM

## 2012-04-16 DIAGNOSIS — Z8249 Family history of ischemic heart disease and other diseases of the circulatory system: Secondary | ICD-10-CM

## 2012-04-16 NOTE — Procedures (Signed)
Exercise Treadmill Test  Pre-Exercise Testing Evaluation Rhythm: normal sinus  Rate: 66   PR:  .15 QRS:  .09  QT:  .40 QTc: .42     Test  Exercise Tolerance Test Ordering MD: Neena Rhymes , MD  Interpreting MD: Ward Givens NP  Unique Test No: 1  Treadmill:  1  Indication for ETT: Family History  Contraindication to ETT: No   Stress Modality: exercise - treadmill  Cardiac Imaging Performed: non   Protocol: standard Bruce - maximal  Max BP:  184/74  Max MPHR (bpm):  167 85% MPR (bpm):  142  MPHR obtained (bpm):  164 % MPHR obtained:  97%  Reached 85% MPHR (min:sec): 9:40 Total Exercise Time (min-sec):  12:32  Workload in METS:  14.3 Borg Scale: 17  Reason ETT Terminated:  leg cramps    ST Segment Analysis At Rest: normal ST segments - no evidence of significant ST depression With Exercise: no evidence of significant ST depression  Other Information Arrhythmia:  No Angina during ETT:  absent (0) Quality of ETT:  diagnostic  ETT Interpretation:  normal - no evidence of ischemia by ST analysis  Comments: Very good exercise tolerance.  No chest pain.  No acute st/t changes.  Recommendations: Follow-up with primary care as previously scheduled.

## 2012-06-11 ENCOUNTER — Other Ambulatory Visit: Payer: Self-pay | Admitting: Family Medicine

## 2012-06-11 NOTE — Telephone Encounter (Signed)
refill Simvastatin (Tab) ZOCOR 40 MG Take 1 tablet (40 mg total) by mouth at bedtime. #90, last ill 4.12.13, last ov 4.19.13

## 2012-06-13 MED ORDER — SIMVASTATIN 40 MG PO TABS: 40.0000 mg | ORAL_TABLET | Freq: Every day | ORAL | Status: DC

## 2012-06-13 NOTE — Telephone Encounter (Signed)
rx sent to pharmacy by e-script  

## 2012-09-20 ENCOUNTER — Ambulatory Visit (INDEPENDENT_AMBULATORY_CARE_PROVIDER_SITE_OTHER): Payer: BC Managed Care – PPO | Admitting: Family Medicine

## 2012-09-20 ENCOUNTER — Ambulatory Visit: Payer: BC Managed Care – PPO | Admitting: Internal Medicine

## 2012-09-20 ENCOUNTER — Encounter: Payer: Self-pay | Admitting: Family Medicine

## 2012-09-20 VITALS — BP 108/80 | HR 87 | Temp 98.4°F | Ht 70.0 in | Wt 203.8 lb

## 2012-09-20 DIAGNOSIS — J329 Chronic sinusitis, unspecified: Secondary | ICD-10-CM

## 2012-09-20 MED ORDER — SIMVASTATIN 40 MG PO TABS: 40.0000 mg | ORAL_TABLET | Freq: Every day | ORAL | Status: DC

## 2012-09-20 MED ORDER — CLARITHROMYCIN ER 500 MG PO TB24: 1000.0000 mg | ORAL_TABLET | Freq: Every day | ORAL | Status: DC

## 2012-09-20 NOTE — Progress Notes (Signed)
  Subjective:    Patient ID: Dan Sanchez, male    DOB: [birth year], 56 y.o.   MRN: [id number]  HPI ? Sinus infxn- sxs started 3 weeks ago.  + frontal pain.  + nasal congestion, PND.  Minimal cough.  L ear pressure.  No fevers.  No N/V.  Taking OTC sudafed w/out relief.  Not currently on OTC antihistamine.  Hx of similar.   Review of Systems For ROS see HPI     Objective:   Physical Exam  Vitals reviewed. Constitutional: He appears well-developed and well-nourished. No distress.  HENT:  Head: Normocephalic and atraumatic.  Right Ear: Tympanic membrane normal.  Left Ear: Tympanic membrane normal.  Nose: Mucosal edema and rhinorrhea present. Right sinus exhibits maxillary sinus tenderness and frontal sinus tenderness. Left sinus exhibits maxillary sinus tenderness and frontal sinus tenderness.  Mouth/Throat: Mucous membranes are normal. Oropharyngeal exudate and posterior oropharyngeal erythema present. No posterior oropharyngeal edema.       + PND  Eyes: Conjunctivae normal and EOM are normal. Pupils are equal, round, and reactive to light.  Neck: Normal range of motion. Neck supple.  Cardiovascular: Normal rate, regular rhythm and normal heart sounds.   Pulmonary/Chest: Effort normal and breath sounds normal. No respiratory distress. He has no wheezes.       + hacking cough  Lymphadenopathy:    He has no cervical adenopathy.  Skin: Skin is warm and dry.          Assessment & Plan:

## 2012-09-20 NOTE — Patient Instructions (Addendum)
This is a sinus infection Start the Biaxin tonight w/ dinner- 2 tabs at the same time daily Add Claritin or Zyrtec daily for the allergy component HOLD the simvastatin (cholesterol med) while taking antibiotic Call with any questions or concerns Happy Fall!!!

## 2012-09-25 NOTE — Assessment & Plan Note (Signed)
[name] sxs and PE consistent w/ infxn.  Start abx.  Reviewed supportive care and red flags that should prompt return.  Pt expressed understanding and is in agreement w/ plan.  

## 2012-10-12 ENCOUNTER — Encounter: Payer: Self-pay | Admitting: Family Medicine

## 2012-10-12 ENCOUNTER — Ambulatory Visit (INDEPENDENT_AMBULATORY_CARE_PROVIDER_SITE_OTHER): Payer: 59 | Admitting: Family Medicine

## 2012-10-12 VITALS — BP 130/82 | HR 64 | Temp 98.1°F | Wt 202.0 lb

## 2012-10-12 DIAGNOSIS — J329 Chronic sinusitis, unspecified: Secondary | ICD-10-CM

## 2012-10-12 MED ORDER — PREDNISONE 10 MG PO TABS: ORAL_TABLET | ORAL | Status: DC

## 2012-10-12 NOTE — Patient Instructions (Addendum)
This is sinus inflammation- not currently infection Start the Prednisone as directed- take w/ food to avoid upset stomach Continue the vasoline or saline nasal spray to keep the scab moist Afrin if you again have a nose bleed Humidify the air at night Call with any questions or concerns Hang in there!!!

## 2012-10-12 NOTE — Progress Notes (Signed)
  Subjective:    Patient ID: Dan Sanchez, male    DOB: 13-Jan-1956, 56 y.o.   MRN: [id number]  HPI Sinusitis- last seen on 10/24 and dx'd w/ sinus infxn.  R sided sxs have improved but L side continues to have facial pain, ear pressure, nose bleeds.  No cough.  No fevers.  Will use vasoline to keep nostrils moist but really struggles at night.  Hx of broken nose.  Has seen ENT and they recommended surgery previously.  Pt not interested in surgery.   Review of Systems For ROS see HPI     Objective:   Physical Exam  Vitals reviewed. Constitutional: He appears well-developed and well-nourished. No distress.  HENT:  Head: Normocephalic and atraumatic.  Nose: Mucosal edema and rhinorrhea present. Epistaxis (scab and scant bleeding seen on medial septum of L nostril) is observed. Right sinus exhibits no maxillary sinus tenderness and no frontal sinus tenderness. Left sinus exhibits no maxillary sinus tenderness and no frontal sinus tenderness.       + PND TMs normal bilaterally  Eyes: Conjunctivae normal and EOM are normal. Pupils are equal, round, and reactive to light.  Neck: Normal range of motion. Neck supple.  Cardiovascular: Normal rate, regular rhythm and normal heart sounds.   Pulmonary/Chest: Effort normal and breath sounds normal. No respiratory distress. He has no wheezes.  Lymphadenopathy:    He has no cervical adenopathy.  Skin: Skin is warm and dry.          Assessment & Plan:

## 2012-10-12 NOTE — Assessment & Plan Note (Signed)
Pt w/ evidence of inflammation but not infection.  No need for abx.  Start steroids to decrease pressure and swelling.  Continue nasal saline and vasoline.  Humidify air while sleeping.  Continue OTC antihistamines.  Limit afrin use to times when nose is bleeding.  Reviewed supportive care and red flags that should prompt return.  Pt expressed understanding and is in agreement w/ plan.

## 2012-12-10 ENCOUNTER — Encounter: Payer: Self-pay | Admitting: Family Medicine

## 2012-12-10 ENCOUNTER — Ambulatory Visit (INDEPENDENT_AMBULATORY_CARE_PROVIDER_SITE_OTHER): Payer: 59 | Admitting: Family Medicine

## 2012-12-10 VITALS — BP 130/90 | HR 87 | Temp 98.3°F | Ht 70.5 in | Wt 204.0 lb

## 2012-12-10 DIAGNOSIS — Z23 Encounter for immunization: Secondary | ICD-10-CM

## 2012-12-10 DIAGNOSIS — H10029 Other mucopurulent conjunctivitis, unspecified eye: Secondary | ICD-10-CM | POA: Insufficient documentation

## 2012-12-10 MED ORDER — POLYMYXIN B-TRIMETHOPRIM 10000-0.1 UNIT/ML-% OP SOLN: OPHTHALMIC | Status: DC

## 2012-12-10 NOTE — Addendum Note (Signed)
Addended by: Verdie Shire on: 12/10/2012 11:58 AM   Modules accepted: Orders

## 2012-12-10 NOTE — Patient Instructions (Addendum)
Start the eye drops- 2 drops 3x/day for no more than 1 week Try and avoid rubbing/scratching eyes- it can spread Call with any questions or concerns Happy New Year!

## 2012-12-10 NOTE — Progress Notes (Signed)
  Subjective:    Patient ID: Dan Sanchez, male    DOB: Oct 13, 1956, 57 y.o.   MRN: [id number]  HPI ? Pink eye- L eye became red and irritate 4 days ago.  Some yellow drainage, some watery drainage.  AM crusting.  Eye feels gritty.  R eye has started to develop redness.  No visual changes.  No fevers.  No sick contacts.  No significant nasal drainage or sinus pressure.  No known foreign body or scratch.  Using OTC eye drops w/out relief.  No light sensitivity.   Review of Systems For ROS see HPI     Objective:   Physical Exam  Vitals reviewed. Constitutional: He appears well-developed and well-nourished. No distress.  HENT:  Head: Normocephalic and atraumatic.  Nose: Nose normal.  Mouth/Throat: Oropharynx is clear and moist. No oropharyngeal exudate.       TMs normal bilaterally  Eyes: EOM are normal. Pupils are equal, round, and reactive to light. Right eye exhibits no discharge. Left eye exhibits no discharge.       L conjunctival injxn w/ limbic sparing Mild R conjunctival injxn No drainage seen No corneal abrasion on fluorescein staining  Neck: Normal range of motion. Neck supple.          Assessment & Plan:

## 2012-12-10 NOTE — Assessment & Plan Note (Signed)
New.  No evidence of systemic illness, no corneal abrasion.  Due to duration of sxs will start abx eye drops.  Reviewed supportive care and red flags that should prompt return.  Pt expressed understanding and is in agreement w/ plan.

## 2012-12-28 ENCOUNTER — Ambulatory Visit (INDEPENDENT_AMBULATORY_CARE_PROVIDER_SITE_OTHER): Payer: 59 | Admitting: Family Medicine

## 2012-12-28 ENCOUNTER — Encounter: Payer: Self-pay | Admitting: Family Medicine

## 2012-12-28 VITALS — BP 140/80 | HR 80 | Temp 98.6°F | Ht 70.5 in | Wt 204.6 lb

## 2012-12-28 DIAGNOSIS — J329 Chronic sinusitis, unspecified: Secondary | ICD-10-CM

## 2012-12-28 MED ORDER — CLARITHROMYCIN ER 500 MG PO TB24: 1000.0000 mg | ORAL_TABLET | Freq: Every day | ORAL | Status: AC

## 2012-12-28 MED ORDER — GUAIFENESIN-CODEINE 100-10 MG/5ML PO SYRP: 10.0000 mL | ORAL_SOLUTION | Freq: Three times a day (TID) | ORAL | Status: AC | PRN

## 2012-12-28 NOTE — Patient Instructions (Addendum)
Start the Biaxin- 2 tabs at the same time daily- take w/ food Cough syrup as needed- will cause drowsiness Drink plenty fluids REST! Hang in there!!!

## 2012-12-28 NOTE — Progress Notes (Signed)
  Subjective:    Patient ID: Dan Sanchez, male    DOB: 1956-08-12, 57 y.o.   MRN: [id number]  HPI URI- sxs started 5 days ago.  Yesterday developed severe facial pressure, green nasal drainage, 'hacking cough'- productive.  Bilateral ear fullness w/ decreased hearing.  No fevers.  + sick contacts.  No N/V/D.   Review of Systems For ROS see HPI     Objective:   Physical Exam  Vitals reviewed. Constitutional: He appears well-developed and well-nourished. No distress.  HENT:  Head: Normocephalic and atraumatic.  Right Ear: Tympanic membrane normal.  Left Ear: Tympanic membrane normal.  Nose: Mucosal edema and rhinorrhea present. Right sinus exhibits maxillary sinus tenderness and frontal sinus tenderness. Left sinus exhibits maxillary sinus tenderness and frontal sinus tenderness.  Mouth/Throat: Mucous membranes are normal. Oropharyngeal exudate and posterior oropharyngeal erythema present. No posterior oropharyngeal edema.       + PND  Eyes: Conjunctivae normal and EOM are normal. Pupils are equal, round, and reactive to light.  Neck: Normal range of motion. Neck supple.  Cardiovascular: Normal rate, regular rhythm and normal heart sounds.   Pulmonary/Chest: Effort normal and breath sounds normal. No respiratory distress. He has no wheezes.       + hacking cough  Lymphadenopathy:    He has no cervical adenopathy.  Skin: Skin is warm and dry.          Assessment & Plan:

## 2012-12-30 NOTE — Assessment & Plan Note (Signed)
This is recurrent problem for pt.  Abx.  Will refer to ENT for evaluation at pt's request.  Reviewed supportive care and red flags that should prompt return.  Pt expressed understanding and is in agreement w/ plan.

## 2013-05-10 ENCOUNTER — Ambulatory Visit (INDEPENDENT_AMBULATORY_CARE_PROVIDER_SITE_OTHER): Payer: 59 | Admitting: Family Medicine

## 2013-05-10 ENCOUNTER — Encounter: Payer: Self-pay | Admitting: Family Medicine

## 2013-05-10 VITALS — BP 130/88 | HR 70 | Temp 98.4°F | Ht 70.5 in | Wt 202.8 lb

## 2013-05-10 DIAGNOSIS — H1132 Conjunctival hemorrhage, left eye: Secondary | ICD-10-CM

## 2013-05-10 DIAGNOSIS — H1130 Conjunctival hemorrhage, unspecified eye: Secondary | ICD-10-CM

## 2013-05-10 NOTE — Patient Instructions (Addendum)
Go to the eye doctor I suspect this is a subconjunctival hemorrhage- a broken blood vessel- and will resolve w/ time but we need to make sure there's nothing more serious going on Call with any questions or concerns Hang in there!

## 2013-05-10 NOTE — Assessment & Plan Note (Signed)
New.  Due to extensiveness of bleed, will refer to ophtho for complete evaluation.  Pt to leave now for appt.  Pt expressed understanding and is in agreement w/ plan.

## 2013-05-10 NOTE — Progress Notes (Signed)
  Subjective:    Patient ID: Dan Sanchez, male    DOB: 1956/02/23, 57 y.o.   MRN: [id number]  HPI Red eye- L eye, noticed Monday.  Not painful, no visual changes.  Wife reports slight worsening.  Pt gagged while brushing teeth Monday morning.  No injury from eye, no drainage.  No itching or gritty feeling, no foreign body sensation.   Review of Systems For ROS see HPI     Objective:   Physical Exam  Vitals reviewed. Constitutional: He appears well-developed and well-nourished. No distress.  HENT:  Head: Normocephalic and atraumatic.  Eyes: EOM are normal. Pupils are equal, round, and reactive to light. Right eye exhibits no discharge. Left eye exhibits no discharge.  L eye w/ diffuse subconjunctival hemorrhage.  No TTP over globe.  EOM intact.  Vision 20/20 w/out correction.          Assessment & Plan:

## 2013-05-19 ENCOUNTER — Ambulatory Visit (INDEPENDENT_AMBULATORY_CARE_PROVIDER_SITE_OTHER): Payer: 59 | Admitting: Physician Assistant

## 2013-05-19 DIAGNOSIS — T6391XA Toxic effect of contact with unspecified venomous animal, accidental (unintentional), initial encounter: Secondary | ICD-10-CM

## 2013-05-19 DIAGNOSIS — M7989 Other specified soft tissue disorders: Secondary | ICD-10-CM

## 2013-05-19 MED ORDER — PREDNISONE 20 MG PO TABS: ORAL_TABLET | ORAL | Status: DC

## 2013-05-19 NOTE — Patient Instructions (Signed)
Keep your hand elevated when possible, and move your fingers and wrist. Staying cool and dry will keep it more comfortable.

## 2013-05-19 NOTE — Progress Notes (Signed)
  Subjective:    Patient ID: Dan Sanchez, male    DOB: May 13, 1956, 57 y.o.   MRN: [id number]  HPI This 57 y.o. male presents for evaluation of swelling and itching of the RIGHT hand after he was stung by a flying insect [place].  The sting was at the lateral aspect of the base of the 4th finger. The swelling has spread just past the wrist this morning.  No pain, just itching.  No facial or oral symptoms.  No SOB. No drainage from the wound.  Past medical history, surgical history, family history, social history and problem list reviewed.  Review of Systems As above.    Objective:   Physical Exam  Vitals reviewed. Constitutional: He is oriented to person, place, and time. Vital signs are normal. He appears well-developed and well-nourished. He is active. No distress.  Eyes: Conjunctivae are normal.  Cardiovascular: Normal rate.   Pulmonary/Chest: Effort normal.  Musculoskeletal:       Right hand: He exhibits decreased range of motion and swelling. He exhibits no tenderness (due to swelling), no bony tenderness, normal two-point discrimination, normal capillary refill, no deformity and no laceration. Normal sensation noted. Normal strength noted.  Neurological: He is alert and oriented to person, place, and time.  Skin: Skin is warm and dry. There is erythema.     Psychiatric: He has a normal mood and affect. His behavior is normal.       Assessment & Plan:  Insect sting, initial encounter /Swelling of limb- Plan: predniSONE (DELTASONE) 20 MG tablet    Fernande Bras, PA-C Physician Assistant-Certified [facility]

## 2013-05-20 ENCOUNTER — Other Ambulatory Visit: Payer: Self-pay | Admitting: Radiology

## 2013-05-20 MED ORDER — PREDNISONE 20 MG PO TABS: ORAL_TABLET | ORAL | Status: DC

## 2013-05-20 NOTE — Telephone Encounter (Signed)
Patient advised Rx sent to [facility], he left his Rx at home he is in [place]

## 2013-06-06 ENCOUNTER — Encounter: Payer: Self-pay | Admitting: Family Medicine

## 2013-09-27 ENCOUNTER — Encounter: Payer: Self-pay | Admitting: Family Medicine

## 2013-09-27 ENCOUNTER — Ambulatory Visit (INDEPENDENT_AMBULATORY_CARE_PROVIDER_SITE_OTHER): Payer: 59 | Admitting: Family Medicine

## 2013-09-27 VITALS — BP 120/80 | HR 79 | Temp 98.1°F | Resp 16 | Wt 201.2 lb

## 2013-09-27 DIAGNOSIS — J329 Chronic sinusitis, unspecified: Secondary | ICD-10-CM

## 2013-09-27 MED ORDER — AMOXICILLIN 875 MG PO TABS: 875.0000 mg | ORAL_TABLET | Freq: Two times a day (BID) | ORAL | Status: DC

## 2013-09-27 NOTE — Progress Notes (Signed)
  Subjective:    Patient ID: Dan Sanchez, male    DOB: [birth year], 57 y.o.   MRN: [id number]  HPI URI- sxs started 2 weeks ago.  + nasal congestion.  Started using Afrin to improve breathing.  + facial pain.  + PND.  Bilateral ear fullness.  + pain w/ leaning forward.  No tooth pain.  No fever.  No cough.  No nausea.  No known sick contacts.   Review of Systems For ROS see HPI     Objective:   Physical Exam  Vitals reviewed. Constitutional: He appears well-developed and well-nourished. No distress.  HENT:  Head: Normocephalic and atraumatic.  Right Ear: Tympanic membrane normal.  Left Ear: Tympanic membrane normal.  Nose: Mucosal edema and rhinorrhea present. Right sinus exhibits maxillary sinus tenderness. Right sinus exhibits no frontal sinus tenderness. Left sinus exhibits maxillary sinus tenderness. Left sinus exhibits no frontal sinus tenderness.  Mouth/Throat: Mucous membranes are normal. Oropharyngeal exudate and posterior oropharyngeal erythema present. No posterior oropharyngeal edema.  + PND  Eyes: Conjunctivae and EOM are normal. Pupils are equal, round, and reactive to light.  Neck: Normal range of motion. Neck supple.  Cardiovascular: Normal rate, regular rhythm and normal heart sounds.   Pulmonary/Chest: Effort normal and breath sounds normal. No respiratory distress. He has no wheezes.  Lymphadenopathy:    He has no cervical adenopathy.  Skin: Skin is warm and dry.          Assessment & Plan:

## 2013-09-27 NOTE — Patient Instructions (Signed)
Follow up as needed Start the Amoxicillin twice daily- take w/ food Drink plenty of fluids Continue your allergy meds Call with any questions or concerns Hang in there!!!

## 2013-09-27 NOTE — Assessment & Plan Note (Signed)
[name] sxs and PE consistent w/ infxn.  Start abx.  Reviewed supportive care and red flags that should prompt return.  Pt expressed understanding and is in agreement w/ plan.  

## 2013-10-18 ENCOUNTER — Other Ambulatory Visit: Payer: Self-pay | Admitting: Family Medicine

## 2013-10-18 NOTE — Telephone Encounter (Signed)
Med filled.  

## 2014-01-13 ENCOUNTER — Encounter: Payer: Self-pay | Admitting: Internal Medicine

## 2014-01-13 ENCOUNTER — Ambulatory Visit (INDEPENDENT_AMBULATORY_CARE_PROVIDER_SITE_OTHER): Payer: 59 | Admitting: Internal Medicine

## 2014-01-13 VITALS — BP 137/76 | HR 83 | Temp 98.3°F | Wt 203.0 lb

## 2014-01-13 DIAGNOSIS — J329 Chronic sinusitis, unspecified: Secondary | ICD-10-CM

## 2014-01-13 MED ORDER — AMOXICILLIN 500 MG PO CAPS: 1000.0000 mg | ORAL_CAPSULE | Freq: Two times a day (BID) | ORAL | Status: DC

## 2014-01-13 MED ORDER — FLUTICASONE PROPIONATE 50 MCG/ACT NA SUSP: 2.0000 | Freq: Every day | NASAL | Status: DC

## 2014-01-13 NOTE — Progress Notes (Signed)
Pre visit review using our clinic review tool, if applicable. No additional management support is needed unless otherwise documented below in the visit note. 

## 2014-01-13 NOTE — Patient Instructions (Signed)
Rest, fluids , tylenol If  cough, take Mucinex DM twice a day as needed  For congestion use FLONASE as prescribed or  OTC Nasocort: 2 nasal sprays on each side of the nose daily until you feel better  Take the antibiotic as prescribed  (Amoxicillin) Call if no better in few days Call anytime if the symptoms are severe

## 2014-01-13 NOTE — Progress Notes (Signed)
   Subjective:    Patient ID: Dan Sanchez, male    DOB: October 14, 1956, 58 y.o.   MRN: [id number]  DOS:  01/13/2014 Acute visit, we discussed the following issues " I have a cold", symptoms during the weekend, left facial discomfort, ear ache, blowing yellow nasal discharge.  Past Medical History  Diagnosis Date  . Hyperlipidemia   . Urinary incontinence   . Allergy     Past Surgical History  Procedure Laterality Date  . Shoulder surgery      Right shoulder     History   Social History  . Marital Status: Married    Spouse Name: N/A    Number of Children: N/A  . 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 Smoking status: Never Smoker   . Smokeless tobacco: Not on file  . Alcohol Use: No  . Drug Use: Not on file  . Sexual Activity: Not on file   Other Topics Concern  . Not on file   Social History Narrative  . No narrative on file    ROS  No fever or chills Mild cough today  he thinks due to nasal congestion. Denies any facial rash.     Objective:   Physical Exam BP 137/76  Pulse 83  Temp(Src) 98.3 F (36.8 C)  Wt 203 lb (92.08 kg)  SpO2 97% General -- alert, well-developed, NAD.  HEENT-- Not pale. TMs normal, throat symmetric, no redness or discharge. Face symmetric, sinuses : tender L max area, other sinuses no TTP. Nose moderately  congested.  Lungs -- normal respiratory effort, no intercostal retractions, no accessory muscle use, and normal breath sounds.  Heart-- normal rate, regular rhythm, no murmur.  Neurologic--  alert & oriented X3. Speech normal, gait normal, strength normal in all extremities.  EOMI, PERLA   Psych-- Cognition and judgment appear intact. Cooperative with normal attention span and concentration. No anxious or depressed appearing.      Assessment & Plan:   Symptoms consistent with acute sinusitis, see instructions. Needs a refill on Flonase, cost may be an issue,  use nasocort if needed

## 2014-01-14 ENCOUNTER — Encounter: Payer: Self-pay | Admitting: Internal Medicine

## 2014-01-30 ENCOUNTER — Telehealth: Payer: Self-pay

## 2014-01-30 NOTE — Telephone Encounter (Signed)
Medication List and allergies:  Updated and Reviewed  90 day supply/mail order: n/a Local prescriptions:  [facility] [place]  Immunization due:  Influenza- declined  A/P: No changes to personal, family or [place] Flu- declined Tdap-12/24/07 CCS- 04/08/09- negative PSA- 03/16/12- 1.16   To discuss with provider: Not at this time.

## 2014-02-03 ENCOUNTER — Encounter: Payer: Self-pay | Admitting: Family Medicine

## 2014-02-03 ENCOUNTER — Ambulatory Visit (INDEPENDENT_AMBULATORY_CARE_PROVIDER_SITE_OTHER): Payer: 59 | Admitting: Family Medicine

## 2014-02-03 VITALS — BP 120/78 | HR 74 | Temp 98.2°F | Resp 16 | Ht 71.0 in | Wt 203.2 lb

## 2014-02-03 DIAGNOSIS — Z0000 Encounter for general adult medical examination without abnormal findings: Secondary | ICD-10-CM

## 2014-02-03 DIAGNOSIS — F528 Other sexual dysfunction not due to a substance or known physiological condition: Secondary | ICD-10-CM

## 2014-02-03 DIAGNOSIS — K219 Gastro-esophageal reflux disease without esophagitis: Secondary | ICD-10-CM

## 2014-02-03 DIAGNOSIS — G4700 Insomnia, unspecified: Secondary | ICD-10-CM | POA: Insufficient documentation

## 2014-02-03 LAB — CBC WITH DIFFERENTIAL/PLATELET
BASOS PCT: 0.6 % (ref 0.0–3.0)
Basophils Absolute: 0 10*3/uL (ref 0.0–0.1)
EOS PCT: 6.7 % — AB (ref 0.0–5.0)
Eosinophils Absolute: 0.2 10*3/uL (ref 0.0–0.7)
HEMATOCRIT: 41 % (ref 39.0–52.0)
Hemoglobin: 13.8 g/dL (ref 13.0–17.0)
LYMPHS ABS: 1.4 10*3/uL (ref 0.7–4.0)
Lymphocytes Relative: 36.8 % (ref 12.0–46.0)
MCHC: 33.7 g/dL (ref 30.0–36.0)
MCV: 88.9 fl (ref 78.0–100.0)
MONO ABS: 0.3 10*3/uL (ref 0.1–1.0)
Monocytes Relative: 7.9 % (ref 3.0–12.0)
NEUTROS PCT: 48 % (ref 43.0–77.0)
Neutro Abs: 1.8 10*3/uL (ref 1.4–7.7)
Platelets: 252 10*3/uL (ref 150.0–400.0)
RBC: 4.6 Mil/uL (ref 4.22–5.81)
RDW: 13.2 % (ref 11.5–14.6)
WBC: 3.8 10*3/uL — AB (ref 4.5–10.5)

## 2014-02-03 LAB — HEPATIC FUNCTION PANEL
ALBUMIN: 4.1 g/dL (ref 3.5–5.2)
ALK PHOS: 54 U/L (ref 39–117)
ALT: 38 U/L (ref 0–53)
AST: 26 U/L (ref 0–37)
Bilirubin, Direct: 0.1 mg/dL (ref 0.0–0.3)
TOTAL PROTEIN: 6.7 g/dL (ref 6.0–8.3)
Total Bilirubin: 0.6 mg/dL (ref 0.3–1.2)

## 2014-02-03 LAB — BASIC METABOLIC PANEL
BUN: 22 mg/dL (ref 6–23)
CHLORIDE: 107 meq/L (ref 96–112)
CO2: 26 mEq/L (ref 19–32)
Calcium: 9.3 mg/dL (ref 8.4–10.5)
Creatinine, Ser: 1.2 mg/dL (ref 0.4–1.5)
GFR: 64.2 mL/min (ref >60.00)
GLUCOSE: 104 mg/dL — AB (ref 70–99)
POTASSIUM: 4.4 meq/L (ref 3.5–5.1)
Sodium: 141 mEq/L (ref 135–145)

## 2014-02-03 LAB — LIPID PANEL
Cholesterol: 189 mg/dL (ref 0–200)
HDL: 57.1 mg/dL (ref >39.00)
LDL Cholesterol: 113 mg/dL — ABNORMAL HIGH (ref 0–99)
Total CHOL/HDL Ratio: 3
Triglycerides: 94 mg/dL (ref 0.0–149.0)
VLDL: 18.8 mg/dL (ref 0.0–40.0)

## 2014-02-03 LAB — TSH: TSH: 1.96 u[IU]/mL (ref 0.35–5.50)

## 2014-02-03 LAB — PSA: PSA: 0.93 ng/mL (ref 0.10–4.00)

## 2014-02-03 MED ORDER — OMEPRAZOLE 20 MG PO CPDR: 20.0000 mg | DELAYED_RELEASE_CAPSULE | Freq: Every day | ORAL | Status: DC

## 2014-02-03 MED ORDER — SILDENAFIL CITRATE 100 MG PO TABS: 100.0000 mg | ORAL_TABLET | Freq: Every day | ORAL | Status: DC | PRN

## 2014-02-03 NOTE — Assessment & Plan Note (Signed)
Refill provided on Viagra along w/ samples and coupon card.

## 2014-02-03 NOTE — Assessment & Plan Note (Signed)
[name] PE WNL.  UTD on colonoscopy.  Check labs.  Anticipatory guidance provided.

## 2014-02-03 NOTE — Progress Notes (Signed)
Pre visit review using our clinic review tool, if applicable. No additional management support is needed unless otherwise documented below in the visit note. 

## 2014-02-03 NOTE — Assessment & Plan Note (Signed)
New.  Pt now using Tums as needed but having increased sxs recently.  Reviewed lifestyle and dietary modifications.  Start low dose PPI.  Reviewed supportive care and red flags that should prompt return.  Pt expressed understanding and is in agreement w/ plan.

## 2014-02-03 NOTE — Patient Instructions (Signed)
Follow up in 6 months to recheck cholesterol We'll notify you of your lab results and make any changes if needed Start the Omeprazole daily for increased heartburn Drink plenty of fluids Keep up the good work!  You look great! Call with any questions or concerns Happy Spring!

## 2014-02-03 NOTE — Progress Notes (Signed)
   Subjective:    Patient ID: Dan Sanchez, male    DOB: Feb 02, 1956, 58 y.o.   MRN: [id number]  HPI CPE- UTD on colonoscopy.  Insomnia- pt reports this is ongoing issue and he has been taking OTC sleep aid w/ only intermittent relief.  Pt reports he also finds he has very little patience.  +GERD- never used to have trouble w/ spicy food but now having frequent indigestion.  Not currently taking anything.  Review of Systems Patient reports no vision/hearing changes, anorexia, fever ,adenopathy, persistant/recurrent hoarseness, swallowing issues, chest pain, palpitations, edema, persistant/recurrent cough, hemoptysis, dyspnea (rest,exertional, paroxysmal nocturnal), gastrointestinal  bleeding (melena, rectal bleeding), abdominal pain, GU symptoms (dysuria, hematuria, voiding/incontinence issues) syncope, focal weakness, memory loss, numbness & tingling, skin/hair/nail changes, depression, anxiety, abnormal bruising/bleeding, musculoskeletal symptoms/signs.     Objective:   Physical Exam BP 120/78  Pulse 74  Temp(Src) 98.2 F (36.8 C) (Oral)  Resp 16  Ht 5\' 11"  (1.803 m)  Wt 203 lb 4 oz (92.194 kg)  BMI 28.36 kg/m2  SpO2 99%  General Appearance:    Alert, cooperative, no distress, appears stated age  Head:    Normocephalic, without obvious abnormality, atraumatic  Eyes:    PERRL, conjunctiva/corneas clear, EOM's intact, fundi    benign, both eyes       Ears:    Normal TM's and external ear canals, both ears  Nose:   Nares normal, septum midline, mucosa normal, no drainage   or sinus tenderness  Throat:   Lips, mucosa, and tongue normal; teeth and gums normal  Neck:   Supple, symmetrical, trachea midline, no adenopathy;       thyroid:  No enlargement/tenderness/nodules  Back:     Symmetric, no curvature, ROM normal, no CVA tenderness  Lungs:     Clear to auscultation bilaterally, respirations unlabored  Chest wall:    No tenderness or deformity  Heart:    Regular rate and rhythm,  S1 and S2 normal, no murmur, rub   or gallop  Abdomen:     Soft, non-tender, bowel sounds active all four quadrants,    no masses, no organomegaly  Genitalia:    Normal male without lesion, discharge or tenderness  Rectal:    Normal tone, normal prostate, no masses or tenderness  Extremities:   Extremities normal, atraumatic, no cyanosis or edema  Pulses:   2+ and symmetric all extremities  Skin:   Skin color, texture, turgor normal, no rashes or lesions  Lymph nodes:   Cervical, supraclavicular, and axillary nodes normal  Neurologic:   CNII-XII intact. Normal strength, sensation and reflexes      throughout          Assessment & Plan:

## 2014-02-03 NOTE — Assessment & Plan Note (Signed)
New.  This may be the cause of pt's short temper and easy frustration or it could be a symptom of anxiety/depression in combination w/ the easy frustration.  Offered Trazodone but pt declined at this time.  Will continue to follow.

## 2014-02-04 ENCOUNTER — Encounter: Payer: Self-pay | Admitting: General Practice

## 2014-02-20 ENCOUNTER — Ambulatory Visit (INDEPENDENT_AMBULATORY_CARE_PROVIDER_SITE_OTHER): Payer: 59 | Admitting: Family Medicine

## 2014-02-20 ENCOUNTER — Encounter: Payer: Self-pay | Admitting: Family Medicine

## 2014-02-20 VITALS — BP 120/80 | HR 79 | Temp 98.2°F | Resp 16 | Wt 205.5 lb

## 2014-02-20 DIAGNOSIS — H1010 Acute atopic conjunctivitis, unspecified eye: Secondary | ICD-10-CM | POA: Insufficient documentation

## 2014-02-20 DIAGNOSIS — H1045 Other chronic allergic conjunctivitis: Secondary | ICD-10-CM

## 2014-02-20 DIAGNOSIS — K219 Gastro-esophageal reflux disease without esophagitis: Secondary | ICD-10-CM

## 2014-02-20 MED ORDER — OLOPATADINE HCL 0.2 % OP SOLN: 1.0000 [drp] | Freq: Every day | OPHTHALMIC | Status: DC

## 2014-02-20 MED ORDER — OMEPRAZOLE 20 MG PO CPDR: 20.0000 mg | DELAYED_RELEASE_CAPSULE | Freq: Every day | ORAL | Status: DC

## 2014-02-20 NOTE — Patient Instructions (Signed)
Schedule w/ derm- they have better toys! Your eye looks much better!  No meds at this time [place]! Happy Spring!

## 2014-02-20 NOTE — Assessment & Plan Note (Signed)
Refill provided on Omeprazole 

## 2014-02-20 NOTE — Assessment & Plan Note (Signed)
New.  Start Pat[place].  Sample given and script sent.  Pt expressed understanding and is in agreement w/ plan.

## 2014-02-20 NOTE — Progress Notes (Signed)
   Subjective:    Patient ID: Dan Sanchez, male    DOB: Apr 24, 1956, 58 y.o.   MRN: [id number]  HPI Eye allergies- pt thought he had pink eye and used neighbor's med but then realized it might be allergies b/c he had been cutting the grass.  Asking if there is anything better than OTC eye drops.  On Claritin daily.  GERD- needs refill on Omeprazole, never picked up prescription   Review of Systems For ROS see HPI     Objective:   Physical Exam  Vitals reviewed. Constitutional: He appears well-developed and well-nourished. No distress.  HENT:  Head: Normocephalic and atraumatic.  Eyes: Conjunctivae and EOM are normal. Pupils are equal, round, and reactive to light. Right eye exhibits no discharge. Left eye exhibits no discharge.          Assessment & Plan:

## 2014-02-20 NOTE — Progress Notes (Signed)
Pre visit review using our clinic review tool, if applicable. No additional management support is needed unless otherwise documented below in the visit note. 

## 2014-02-21 ENCOUNTER — Ambulatory Visit: Payer: 59 | Admitting: Family Medicine

## 2014-07-04 ENCOUNTER — Ambulatory Visit (INDEPENDENT_AMBULATORY_CARE_PROVIDER_SITE_OTHER): Payer: 59 | Admitting: Family Medicine

## 2014-07-04 ENCOUNTER — Encounter: Payer: Self-pay | Admitting: Family Medicine

## 2014-07-04 VITALS — BP 120/70 | HR 72 | Temp 98.2°F | Resp 16 | Wt 204.0 lb

## 2014-07-04 DIAGNOSIS — J019 Acute sinusitis, unspecified: Secondary | ICD-10-CM

## 2014-07-04 MED ORDER — SIMVASTATIN 40 MG PO TABS: ORAL_TABLET | ORAL | Status: DC

## 2014-07-04 MED ORDER — FLUTICASONE PROPIONATE 50 MCG/ACT NA SUSP: 2.0000 | Freq: Every day | NASAL | Status: DC

## 2014-07-04 MED ORDER — AMOXICILLIN 875 MG PO TABS: 875.0000 mg | ORAL_TABLET | Freq: Two times a day (BID) | ORAL | Status: DC

## 2014-07-04 NOTE — Assessment & Plan Note (Signed)
[name] sxs and PE consistent w/ infxn.  Recurrent issue for pt.  Stressed need to take allergy meds daily to avoid infection.  Start amox to treat current issue.  Refills provided.  Reviewed supportive care and red flags that should prompt return.  Pt expressed understanding and is in agreement w/ plan.

## 2014-07-04 NOTE — Progress Notes (Signed)
   Subjective:    Patient ID: Dan Sanchez, male    DOB: Mar 08, 1956, 58 y.o.   MRN: [id number]  Sinusitis Associated symptoms include headaches.  Headache    Sinusitis- sxs started ~2 weeks ago.  + facial pain/pressure, bilateral ear fullness.  No fever.  No N/V/D.  No known sick contacts.  + tooth pain.  Hx of similar.  Minimal cough.   Review of Systems  Neurological: Positive for headaches.   For ROS see HPI     Objective:   Physical Exam  Vitals reviewed. Constitutional: He appears well-developed and well-nourished. No distress.  HENT:  Head: Normocephalic and atraumatic.  Right Ear: Tympanic membrane normal.  Left Ear: A middle ear effusion is present.  Nose: Mucosal edema and rhinorrhea present. Right sinus exhibits maxillary sinus tenderness and frontal sinus tenderness. Left sinus exhibits maxillary sinus tenderness and frontal sinus tenderness.  Mouth/Throat: Mucous membranes are normal. Oropharyngeal exudate and posterior oropharyngeal erythema present. No posterior oropharyngeal edema.  + PND  Eyes: Conjunctivae and EOM are normal. Pupils are equal, round, and reactive to light.  Neck: Normal range of motion. Neck supple.  Cardiovascular: Normal rate, regular rhythm and normal heart sounds.   Pulmonary/Chest: Effort normal and breath sounds normal. No respiratory distress. He has no wheezes.  + hacking cough  Lymphadenopathy:    He has no cervical adenopathy.  Skin: Skin is warm and dry.          Assessment & Plan:

## 2014-07-04 NOTE — Progress Notes (Signed)
Pre visit review using our clinic review tool, if applicable. No additional management support is needed unless otherwise documented below in the visit note. 

## 2014-07-04 NOTE — Patient Instructions (Signed)
Follow up as needed Start the Amoxicillin twice daily- take w/ food Drink plenty of fluids Restart your allergy meds daily (Ragweed is blooming!) Call with any questions or concerns Happy Early Birthday!!!

## 2014-07-22 ENCOUNTER — Telehealth: Payer: Self-pay

## 2014-07-22 ENCOUNTER — Encounter: Payer: Self-pay | Admitting: Emergency Medicine

## 2014-07-22 ENCOUNTER — Emergency Department
Admission: EM | Admit: 2014-07-22 | Discharge: 2014-07-22 | Disposition: A | Discharge status: Home / Self Care | Payer: 59 | Source: Home / Self Care | Attending: Family Medicine | Admitting: Family Medicine

## 2014-07-22 ENCOUNTER — Emergency Department (INDEPENDENT_AMBULATORY_CARE_PROVIDER_SITE_OTHER): Payer: 59

## 2014-07-22 DIAGNOSIS — R0789 Other chest pain: Secondary | ICD-10-CM

## 2014-07-22 DIAGNOSIS — R079 Chest pain, unspecified: Secondary | ICD-10-CM

## 2014-07-22 NOTE — ED Notes (Signed)
Sitting at computer, stood up and experienced 5/10 chest pain with belching x 2 hours.  Pain now at 1/10.  EKG completed and shown to MD.

## 2014-07-22 NOTE — Telephone Encounter (Signed)
Patient called with chest pains per triage protocol. Onset approximately one hour prior to call. No dizziness, light headedness, back or arm pain. No reports of nausea or headaches at this. All other systems unremarkable. No available appts today. Patient encouraged to go to ED or UC. Patient states he will go to [facility].

## 2014-07-22 NOTE — Discharge Instructions (Signed)
Thank you for coming in today. I cannot fully guarantee that you are 100% okay. However I feel that if you do not want to go to the emergency room it is reasonable to followup with your doctor tomorrow and a heart doctor soon. Call or go to the emergency room if you get worse, have trouble breathing, have chest pains, or palpitations.    Chest Pain (Nonspecific) It is often hard to give a specific diagnosis for the cause of chest pain. There is always a chance that your pain could be related to something serious, such as a heart attack or a blood clot in the lungs. You need to follow up with your health care provider for further evaluation. CAUSES   Heartburn.  Pneumonia or bronchitis.  Anxiety or stress.  Inflammation around your heart (pericarditis) or lung (pleuritis or pleurisy).  A blood clot in the lung.  A collapsed lung (pneumothorax). It can develop suddenly on its own (spontaneous pneumothorax) or from trauma to the chest.  Shingles infection (herpes zoster virus). The chest wall is composed of bones, muscles, and cartilage. Any of these can be the source of the pain.  The bones can be bruised by injury.  The muscles or cartilage can be strained by coughing or overwork.  The cartilage can be affected by inflammation and become sore (costochondritis). DIAGNOSIS  Lab tests or other studies may be needed to find the cause of your pain. Your health care provider may have you take a test called an ambulatory electrocardiogram (ECG). An ECG records your heartbeat patterns over a 24-hour period. You may also have other tests, such as:  Transthoracic echocardiogram (TTE). During echocardiography, sound waves are used to evaluate how blood flows through your heart.  Transesophageal echocardiogram (TEE).  Cardiac monitoring. This allows your health care provider to monitor your heart rate and rhythm in real time.  Holter monitor. This is a portable device that records your  heartbeat and can help diagnose heart arrhythmias. It allows your health care provider to track your heart activity for several days, if needed.  Stress tests by exercise or by giving medicine that makes the heart beat faster. TREATMENT   Treatment depends on what may be causing your chest pain. Treatment may include:  Acid blockers for heartburn.  Anti-inflammatory medicine.  Pain medicine for inflammatory conditions.  Antibiotics if an infection is present.  You may be advised to change lifestyle habits. This includes stopping smoking and avoiding alcohol, caffeine, and chocolate.  You may be advised to keep your head raised (elevated) when sleeping. This reduces the chance of acid going backward from your stomach into your esophagus. Most of the time, nonspecific chest pain will improve within 2-3 days with rest and mild pain medicine.  HOME CARE INSTRUCTIONS   If antibiotics were prescribed, take them as directed. Finish them even if you start to feel better.  For the next few days, avoid physical activities that bring on chest pain. Continue physical activities as directed.  Do not use any tobacco products, including cigarettes, chewing tobacco, or electronic cigarettes.  Avoid drinking alcohol.  Only take medicine as directed by your health care provider.  Follow your health care provider's suggestions for further testing if your chest pain does not go away.  Keep any follow-up appointments you made. If you do not go to an appointment, you could develop lasting (chronic) problems with pain. If there is any problem keeping an appointment, call to reschedule. SEEK MEDICAL CARE IF:  Your chest pain does not go away, even after treatment.  You have a rash with blisters on your chest.  You have a fever. SEEK IMMEDIATE MEDICAL CARE IF:   You have increased chest pain or pain that spreads to your arm, neck, jaw, back, or abdomen.  You have shortness of breath.  You have  an increasing cough, or you cough up blood.  You have severe back or abdominal pain.  You feel nauseous or vomit.  You have severe weakness.  You faint.  You have chills. This is an emergency. Do not wait to see if the pain will go away. Get medical help at once. Call your local emergency services (911 in [place] Do not drive yourself to the hospital. MAKE SURE YOU:   Understand these instructions.  Will watch your condition.  Will get help right away if you are not doing well or get worse. Document Released: 08/24/2005 Document Revised: 11/19/2013 Document Reviewed: 06/19/2008 [facility] Patient Information 2015 [place], [place]. This information is not intended to replace advice given to you by your health care provider. Make sure you discuss any questions you have with your health care provider.

## 2014-07-22 NOTE — ED Provider Notes (Signed)
Dan Sanchez is a 58 y.o. male who presents to Urgent Care today for chest pain. Patient developed moderate central stabbing chest pain today. The pain lasted about one hour and occurred at rest. The pain was not exertional at all. He denies any shortness of breath or palpitations. He notes belching which seemed to help. Additionally he tried [place] which may have helped. He is completely asymptomatic currently. He denies any personal history of her disease and notes a normal stress exercise tolerance test 2 years ago. He has a personal history for hyperlipidemia currently taking statins. The pain was nonradiating.   Past Medical History  Diagnosis Date  . Hyperlipidemia   . Urinary incontinence   . Allergy    History  Substance Use Topics  . Smoking status: Never Smoker   . Smokeless tobacco: Not on file  . Alcohol Use: 2.0 oz/week    4 drink(s) per week   ROS as above Medications: No current facility-administered medications for this encounter.   Current Outpatient Prescriptions  Medication Sig Dispense Refill  . aspirin buffered (ADPRIN B) 325 MG TABS tablet Take 325 mg by mouth daily.        . fluticasone (FLONASE) 50 MCG/ACT nasal spray Place 2 sprays into both nostrils daily.  16 g  6  . Olopatadine HCl (PATADAY) 0.2 % SOLN Apply 1 drop to eye daily.  1 Bottle  6  . Omega-3 Fatty Acids (FISH OIL) 300 MG CAPS Take 2,400 mg by mouth daily.       Marland Kitchen omeprazole (PRILOSEC) 20 MG capsule Take 1 capsule (20 mg total) by mouth daily.  30 capsule  6  . sildenafil (VIAGRA) 100 MG tablet Take 1 tablet (100 mg total) by mouth daily as needed for erectile dysfunction.  10 tablet  6  . simvastatin (ZOCOR) 40 MG tablet TAKE 1 TABLET BY MOUTH AT BEDTIME  90 tablet  3    Exam:  BP 169/74  Pulse 73  Temp(Src) 98.7 F (37.1 C) (Oral)  Resp 16  Ht 5' 10.5" (1.791 m)  Wt 197 lb (89.359 kg)  BMI 27.86 kg/m2  SpO2 97% Gen: Well NAD HEENT: EOMI,  MMM Lungs: Normal work of breathing.  CTABL Heart: RRR no MRG Abd: NABS, Soft. Nondistended, Nontender Exts: Brisk capillary refill, warm and well perfused.   Twelve-lead EKG shows normal sinus rhythm at 68 beats per minute. No significant abnormalities. No ST segment elevation depression or Q-waves.  No results found for this or any previous visit (from the past 24 hour(s)). Dg Chest 2 View  07/22/2014   CLINICAL DATA:  Chest pain  EXAM: CHEST  2 VIEW  COMPARISON:  None.  FINDINGS: The heart size and mediastinal contours are within normal limits. Both lungs are clear. The visualized skeletal structures are unremarkable.  IMPRESSION: No active cardiopulmonary disease.   Electronically Signed   By: Lahoma Crocker M.D.   On: 07/22/2014 18:07    Assessment and Plan: 58 y.o. male with chest pain. Seem to be nonanginal type most likely related to indigestion. However we were unable to complete the chest pain evaluation at an urgent care location. We do not have access to troponin. His EKG is normal and he had a recent normal exercise stress test. I feel that it is probably safe for him to followup with his primary care provider and for referral to cardiology for further stress evaluation. However we had a lengthy discussion that I cannot completely guarantee that he has  no risk for acute coronary syndrome. He expresses understanding and agreement and will followup with PCP. He'll present to the emergency department if worsening.  Discussed warning signs or symptoms. Please see discharge instructions. Patient expresses understanding.   This note was created using Systems analyst. Any transcription errors are unintended.    Gregor Hams, MD 07/22/14 [phone]

## 2014-07-23 ENCOUNTER — Telehealth: Payer: Self-pay | Admitting: Family Medicine

## 2014-07-23 NOTE — Telephone Encounter (Signed)
Pt scheduled for 07/24/14 at 11am by beth williams. Thank you

## 2014-07-23 NOTE — Telephone Encounter (Signed)
Caller name: Dan Sanchez  Relation to pt: self  Call back number: [phone]   Reason for call:   pt was seen 07/22/14 [facility] for chest pain was advised to follow up with PCP today pt is requesting to see you only either today or tomorrow pt is requesting to be squeezed in. Please advise.

## 2014-07-23 NOTE — Telephone Encounter (Signed)
Ok to use same day for tomorrow (11 or 1)

## 2014-07-24 ENCOUNTER — Encounter: Payer: Self-pay | Admitting: Family Medicine

## 2014-07-24 ENCOUNTER — Other Ambulatory Visit: Payer: Self-pay | Admitting: Family Medicine

## 2014-07-24 ENCOUNTER — Ambulatory Visit (INDEPENDENT_AMBULATORY_CARE_PROVIDER_SITE_OTHER): Payer: 59 | Admitting: Family Medicine

## 2014-07-24 VITALS — BP 150/90 | HR 90 | Temp 98.0°F | Resp 16 | Wt 202.4 lb

## 2014-07-24 DIAGNOSIS — M5380 Other specified dorsopathies, site unspecified: Secondary | ICD-10-CM

## 2014-07-24 DIAGNOSIS — M62830 Muscle spasm of back: Secondary | ICD-10-CM | POA: Insufficient documentation

## 2014-07-24 DIAGNOSIS — R072 Precordial pain: Secondary | ICD-10-CM | POA: Insufficient documentation

## 2014-07-24 NOTE — Assessment & Plan Note (Signed)
New.  Reviewed EKG done at [facility]- unchanged today.  Pt has not had similar CP since UC visit.  His sxs are more consistent w/ GERD/esophageal spasm.  Reviewed normal ETT done 2 yrs ago- will repeat to assess for change.  Will get troponin to assess for elevation.  Provided reassurance that I feel this is highly unlikely to be cardiac.  Suspect pt's BP is elevated due to stress.  Will follow closely.  Reviewed supportive care and red flags that should prompt return.  Pt expressed understanding and is in agreement w/ plan.

## 2014-07-24 NOTE — Assessment & Plan Note (Signed)
New.  Suspect this is cause of pt's scapular pain as there is a palpable knot.  NSAIDs and heat prn.  Reviewed supportive care and red flags that should prompt return.  Pt expressed understanding and is in agreement w/ plan.

## 2014-07-24 NOTE — Patient Instructions (Addendum)
Return in 1-2 weeks to recheck BP Your shoulder pain is consistent w/ muscle spasm- heat, ibuprofen as needed We'll notify you of your lab results and make any changes if needed We'll call you to schedule your repeat exercise test I DON"T THINK THIS IS YOUR HEART!!  This is good news! Call with any questions or concerns Hang in there!

## 2014-07-24 NOTE — Progress Notes (Signed)
Pre visit review using our clinic review tool, if applicable. No additional management support is needed unless otherwise documented below in the visit note. 

## 2014-07-24 NOTE — Progress Notes (Signed)
   Subjective:    Patient ID: Dan Sanchez, male    DOB: June 12, 1956, 58 y.o.   MRN: [id number]  HPI UC- pt had ~2 hrs of substernal CP on 8/25 and went to UC.  EKG looked good.  Some improvement w/ tums and belching.  Has not had CP since.  Normal stress test 04/16/12.  No radiation of pain to jaw/shoulder.  Denies increased stress preceding pain.  No associated N/V, SOB.  Pt had club sandwich prior to pain starting.  Today woke w/ L shoulder blade tightness- did not have this previously.  'it feels like when you're stiff from working out'.   Review of Systems For ROS see HPI     Objective:   Physical Exam  Vitals reviewed. Constitutional: He is oriented to person, place, and time. He appears well-developed and well-nourished. No distress.  HENT:  Head: Normocephalic and atraumatic.  Eyes: Conjunctivae and EOM are normal. Pupils are equal, round, and reactive to light.  Neck: Normal range of motion. Neck supple. No thyromegaly present.  Cardiovascular: Normal rate, regular rhythm, normal heart sounds and intact distal pulses.   No murmur heard. Pulmonary/Chest: Effort normal and breath sounds normal. No respiratory distress.  Abdominal: Soft. Bowel sounds are normal. He exhibits no distension.  Musculoskeletal: He exhibits tenderness (muscle spasm on L overlying scapula). He exhibits no edema.  Lymphadenopathy:    He has no cervical adenopathy.  Neurological: He is alert and oriented to person, place, and time. No cranial nerve deficit.  Skin: Skin is warm and dry.  Psychiatric: He has a normal mood and affect. His behavior is normal.          Assessment & Plan:

## 2014-07-25 LAB — TROPONIN I: Troponin I: 0.01 ng/mL (ref <0.06)

## 2014-08-08 ENCOUNTER — Ambulatory Visit (INDEPENDENT_AMBULATORY_CARE_PROVIDER_SITE_OTHER): Payer: 59 | Admitting: Family Medicine

## 2014-08-08 ENCOUNTER — Encounter: Payer: Self-pay | Admitting: Family Medicine

## 2014-08-08 VITALS — BP 138/90 | HR 68 | Temp 98.2°F | Resp 16 | Wt 202.5 lb

## 2014-08-08 DIAGNOSIS — IMO0001 Reserved for inherently not codable concepts without codable children: Secondary | ICD-10-CM

## 2014-08-08 DIAGNOSIS — R030 Elevated blood-pressure reading, without diagnosis of hypertension: Secondary | ICD-10-CM

## 2014-08-08 DIAGNOSIS — J301 Allergic rhinitis due to pollen: Secondary | ICD-10-CM

## 2014-08-08 NOTE — Progress Notes (Signed)
Pre visit review using our clinic review tool, if applicable. No additional management support is needed unless otherwise documented below in the visit note. 

## 2014-08-08 NOTE — Progress Notes (Signed)
   Subjective:    Patient ID: Dan Sanchez, male    DOB: 13-Nov-1956, 58 y.o.   MRN: [id number]  HPI Elevated BP- noted at last visit when pt was very stressed about CP.  Has had no recurrent issues w/ CP.  Able to play tennis w/o difficulty.  No SOB, HAs, visual changes.  Pt has recently switched to Zyrtec D.  Has repeat stress test scheduled for Monday.   Review of Systems For ROS see HPI     Objective:   Physical Exam  Vitals reviewed. Constitutional: He appears well-developed and well-nourished. No distress.  HENT:  Head: Normocephalic and atraumatic.  No TTP over sinuses + turbinate edema + PND TMs normal bilaterally  Eyes: Conjunctivae and EOM are normal. Pupils are equal, round, and reactive to light.  Neck: Normal range of motion. Neck supple.  Cardiovascular: Normal rate, regular rhythm and normal heart sounds.   Pulmonary/Chest: Effort normal and breath sounds normal. No respiratory distress. He has no wheezes.  Lymphadenopathy:    He has no cervical adenopathy.  Skin: Skin is warm and dry.          Assessment & Plan:

## 2014-08-10 DIAGNOSIS — R030 Elevated blood-pressure reading, without diagnosis of hypertension: Principal | ICD-10-CM

## 2014-08-10 DIAGNOSIS — J309 Allergic rhinitis, unspecified: Secondary | ICD-10-CM | POA: Insufficient documentation

## 2014-08-10 DIAGNOSIS — IMO0001 Reserved for inherently not codable concepts without codable children: Secondary | ICD-10-CM | POA: Insufficient documentation

## 2014-08-10 NOTE — Assessment & Plan Note (Signed)
Ongoing issue for pt.  Encouraged him to stop using decongestants as this is likely elevating his BP.  Pt to use nasal steroid and OTC antihistamine.  Reviewed supportive care and red flags that should prompt return.  Pt expressed understanding and is in agreement w/ plan.

## 2014-08-10 NOTE — Assessment & Plan Note (Signed)
New.  Noted at last visit but pt was very anxious about his CP.  Today, BP is improved but still mildly elevated.  Suspect this is due to pt's decongestant use.  Encouraged him to stop using this.  BP will be rechecked on Monday during exercise stress test.

## 2014-08-11 ENCOUNTER — Other Ambulatory Visit: Payer: Self-pay | Admitting: Physician Assistant

## 2014-08-11 ENCOUNTER — Encounter: Payer: Self-pay | Admitting: *Deleted

## 2014-08-11 ENCOUNTER — Ambulatory Visit (INDEPENDENT_AMBULATORY_CARE_PROVIDER_SITE_OTHER): Payer: 59 | Admitting: Physician Assistant

## 2014-08-11 DIAGNOSIS — E785 Hyperlipidemia, unspecified: Secondary | ICD-10-CM

## 2014-08-11 DIAGNOSIS — R030 Elevated blood-pressure reading, without diagnosis of hypertension: Secondary | ICD-10-CM

## 2014-08-11 DIAGNOSIS — R072 Precordial pain: Secondary | ICD-10-CM

## 2014-08-11 DIAGNOSIS — R079 Chest pain, unspecified: Secondary | ICD-10-CM | POA: Insufficient documentation

## 2014-08-11 DIAGNOSIS — IMO0001 Reserved for inherently not codable concepts without codable children: Secondary | ICD-10-CM

## 2014-08-11 LAB — CBC WITH DIFFERENTIAL/PLATELET
BASOS ABS: 0 10*3/uL (ref 0.0–0.1)
Basophils Relative: 0.7 % (ref 0.0–3.0)
Eosinophils Absolute: 0.1 10*3/uL (ref 0.0–0.7)
Eosinophils Relative: 3.2 % (ref 0.0–5.0)
HEMATOCRIT: 40.1 % (ref 39.0–52.0)
Hemoglobin: 13.8 g/dL (ref 13.0–17.0)
LYMPHS ABS: 1.6 10*3/uL (ref 0.7–4.0)
Lymphocytes Relative: 36.2 % (ref 12.0–46.0)
MCHC: 34.4 g/dL (ref 30.0–36.0)
MCV: 88.2 fl (ref 78.0–100.0)
Monocytes Absolute: 0.4 10*3/uL (ref 0.1–1.0)
Monocytes Relative: 8.8 % (ref 3.0–12.0)
Neutro Abs: 2.3 10*3/uL (ref 1.4–7.7)
Neutrophils Relative %: 51.1 % (ref 43.0–77.0)
PLATELETS: 266 10*3/uL (ref 150.0–400.0)
RBC: 4.55 Mil/uL (ref 4.22–5.81)
RDW: 12.7 % (ref 11.5–15.5)
WBC: 4.5 10*3/uL (ref 4.0–10.5)

## 2014-08-11 LAB — BASIC METABOLIC PANEL
BUN: 20 mg/dL (ref 6–23)
CO2: 26 mEq/L (ref 19–32)
CREATININE: 1.4 mg/dL (ref 0.4–1.5)
Calcium: 9 mg/dL (ref 8.4–10.5)
Chloride: 104 mEq/L (ref 96–112)
GFR: 58.1 mL/min — ABNORMAL LOW (ref >60.00)
Glucose, Bld: 97 mg/dL (ref 70–99)
POTASSIUM: 4.1 meq/L (ref 3.5–5.1)
Sodium: 137 mEq/L (ref 135–145)

## 2014-08-11 LAB — PROTIME-INR
INR: 1 ratio (ref 0.8–1.0)
PROTHROMBIN TIME: 11.4 s (ref 9.6–13.1)

## 2014-08-11 NOTE — Assessment & Plan Note (Signed)
BP elevated with GXT.

## 2014-08-11 NOTE — Progress Notes (Signed)
HPI: This is a 58 year old male patient of Dr. Birdie Riddle who is referred to [place] for a stress test after an episode of chest pain. The patient had eaten a club sandwich that day and driven from [place]. He was working at his computer and developed chest pain and tightness in the center of his chest. The pain did not radiate he had no associated shortness of breath dizziness or presyncope. He took 3 TUMS without relief and was driving to urgent care when it eased spontaneously. EKG was unchanged. He saw Dr. Birdie Riddle 2 days later who ordered a Troponin that was negative.  The patient is extremely active and played 3 hours of tennis yesterday he was also using on a rate or at his [place] for several hours without chest pain. He has had no further chest pain since this episode. His father did have his first MI at age 42 and died at 73. Patient also has hyperlipidemia and recent trouble with blood pressure. He is not diabetic and has never smoked. He had a GXT in 2013 that was completely normal.  The patient exercised 10 minutes on a stress test today without chest pain. At peak exercise he did develop ST elevation in aVR and aVL and 2-3 mm ST depression inferolaterally. These EKG changes resolved quickly in recovery. He was asymptomatic. I reviewed the stress test with Dr. Meda Coffee who feels that  it's a high risk study and recommends cardiac catheterization.     No Known Allergies   Current Outpatient Prescriptions  Medication Sig Dispense Refill  . aspirin buffered (ADPRIN B) 325 MG TABS tablet Take 325 mg by mouth daily.        . fluticasone (FLONASE) 50 MCG/ACT nasal spray Place 2 sprays into both nostrils daily.  16 g  6  . Olopatadine HCl (PATADAY) 0.2 % SOLN Apply 1 drop to eye daily.  1 Bottle  6  . Omega-3 Fatty Acids (FISH OIL) 300 MG CAPS Take 2,400 mg by mouth daily.       Marland Kitchen omeprazole (PRILOSEC) 20 MG capsule Take 1 capsule (20 mg total) by mouth daily.  30 capsule  6  . sildenafil  (VIAGRA) 100 MG tablet Take 1 tablet (100 mg total) by mouth daily as needed for erectile dysfunction.  10 tablet  6  . simvastatin (ZOCOR) 40 MG tablet TAKE 1 TABLET BY MOUTH AT BEDTIME  90 tablet  3   No current facility-administered medications for this visit.    Past Medical History  Diagnosis Date  . Hyperlipidemia   . Urinary incontinence   . Allergy     Past Surgical History  Procedure Laterality Date  . Shoulder surgery      Right shoulder     Family History  Problem Relation Age of Onset  . Heart disease Father     MI age 30    History   Social History  . Marital Status: Married    Spouse Name: N/A    Number of Children: N/A  . 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 Smoking status: Never Smoker   . Smokeless tobacco: Not on file  . Alcohol Use: 2.0 oz/week    4 drink(s) per week  . Drug Use: No  . Sexual Activity: Not on file   Other Topics Concern  . Not on file   Social History Narrative  .  No narrative on file    ROS: 10 point review of systems negative. See history of present illness  See stress test for vitals.  PHYSICAL EXAM: Well-nournished, in no acute distress. Neck: No JVD, HJR, Bruit, or thyroid enlargement  Lungs: No tachypnea, clear without wheezing, rales, or rhonchi  Cardiovascular: RRR, PMI not displaced, heart sounds normal, no murmurs, gallops, bruit, thrill, or heave.  Abdomen: BS normal. Soft without organomegaly, masses, lesions or tenderness.  Extremities: without cyanosis, clubbing or edema. Good distal pulses bilateral  SKin: Warm, no lesions or rashes   Musculoskeletal: No deformities  Neuro: no focal signs   Wt Readings from Last 3 Encounters:  08/08/14 202 lb 8 oz (91.853 kg)  07/24/14 202 lb 6 oz (91.797 kg)  07/22/14 197 lb (89.359 kg)     EKG: Normal sinus rhythm normal EKG

## 2014-08-11 NOTE — Assessment & Plan Note (Signed)
Treated

## 2014-08-11 NOTE — Progress Notes (Signed)
Exercise Treadmill Test  Pre-Exercise Testing Evaluation Rhythm: normal sinus  Rate: 60     Test  Exercise Tolerance Test Ordering MD: Ena Dawley  Interpreting MD: Estella Husk, PA-C  Unique Test No: 1  Treadmill:  1  Indication for ETT: chest pain - rule out ischemia  Contraindication to ETT: No   Stress Modality: exercise - treadmill  Cardiac Imaging Performed: non   Protocol: standard Bruce - maximal  Max BP:  225/70  Max MPHR (bpm):  164 85% MPR (bpm):  139  MPHR obtained (bpm):  162 % MPHR obtained:  99  Reached 85% MPHR (min:sec):  7:09 Total Exercise Time (min-sec):  10:00  Workload in METS:  11.7 Borg Scale: 15  Reason ETT Terminated:  fatigue    ST Segment Analysis At Rest: normal ST segments - no evidence of significant ST depression With Exercise: significant ischemic ST depression and elevation  Other Information Arrhythmia:  No Angina during ETT:  absent (0) Quality of ETT:  diagnostic   ETT Interpretation:  abnormal - evidence of ST depression consistent with ischemia  Comments: Markedly positive stress test with ST elevation avr and avl with inferior lateral ST depression resolved quickly in recovery. No chest pain. Exercised 10 min. BP elevated.  Recommendations: Discussed in detail with Dr. Meda Coffee who recommends cardiac catherization. High risk study.

## 2014-08-11 NOTE — Progress Notes (Signed)
Your physician recommends that you have lab work today: [facility]  Your physician has requested that you have a cardiac catheterization. Cardiac catheterization is used to diagnose and/or treat various heart conditions. Doctors may recommend this procedure for a number of different reasons. The most common reason is to evaluate chest pain. Chest pain can be a symptom of coronary artery disease (CAD), and cardiac catheterization can show whether plaque is narrowing or blocking your heart's arteries. This procedure is also used to evaluate the valves, as well as measure the blood flow and oxygen levels in different parts of your heart. For further information please visit [email or web address].[email or web address]. Please follow instruction sheet, as given.

## 2014-08-11 NOTE — H&P (Signed)
HPI: This is a 58 year old male patient of Dr. Birdie Riddle who is referred to [place] for a stress test after an episode of chest pain. The patient had eaten a club sandwich that day and driven from [place]. He was working at his computer and developed chest pain and tightness in the center of his chest. The pain did not radiate he had no associated shortness of breath dizziness or presyncope. He took 3 TUMS without relief and was driving to urgent care when it eased spontaneously. EKG was unchanged. He saw Dr. Birdie Riddle 2 days later who ordered a Troponin that was negative.  The patient is extremely active and played 3 hours of tennis yesterday he was also using on a rate or at his [place] for several hours without chest pain. He has had no further chest pain since this episode. His father did have his first MI at age 21 and died at 3. Patient also has hyperlipidemia and recent trouble with blood pressure. He is not diabetic and has never smoked. He had a GXT in 2013 that was completely normal.  The patient exercised 10 minutes on a stress test today without chest pain. At peak exercise he did develop ST elevation in aVR and aVL and 2-3 mm ST depression inferolaterally. These EKG changes resolved quickly in recovery. He was asymptomatic. I reviewed the stress test with Dr. Meda Coffee who feels that  it's a high risk study and recommends cardiac catheterization.     No Known Allergies   Current Outpatient Prescriptions  Medication Sig Dispense Refill  . aspirin buffered (ADPRIN B) 325 MG TABS tablet Take 325 mg by mouth daily.        . fluticasone (FLONASE) 50 MCG/ACT nasal spray Place 2 sprays into both nostrils daily.  16 g  6  . Olopatadine HCl (PATADAY) 0.2 % SOLN Apply 1 drop to eye daily.  1 Bottle  6  . Omega-3 Fatty Acids (FISH OIL) 300 MG CAPS Take 2,400 mg by mouth daily.       Marland Kitchen omeprazole (PRILOSEC) 20 MG capsule Take 1 capsule (20 mg total) by mouth daily.  30 capsule  6  .  sildenafil (VIAGRA) 100 MG tablet Take 1 tablet (100 mg total) by mouth daily as needed for erectile dysfunction.  10 tablet  6  . simvastatin (ZOCOR) 40 MG tablet TAKE 1 TABLET BY MOUTH AT BEDTIME  90 tablet  3   No current facility-administered medications for this visit.    Past Medical History  Diagnosis Date  . Hyperlipidemia   . Urinary incontinence   . Allergy     Past Surgical History  Procedure Laterality Date  . Shoulder surgery      Right shoulder     Family History  Problem Relation Age of Onset  . Heart disease Father     MI age 67    History   Social History  . Marital Status: Married    Spouse Name: N/A    Number of Children: N/A  . 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 Smoking status: Never Smoker   . Smokeless tobacco: Not on file  . Alcohol Use: 2.0 oz/week    4 drink(s) per week  . Drug Use: No  . Sexual Activity: Not on file   Other Topics Concern  . Not on file  Social History Narrative  . No narrative on file    ROS: 10 point review of systems negative. See history of present illness  See stress test for vitals.  PHYSICAL EXAM: Well-nournished, in no acute distress. Neck: No JVD, HJR, Bruit, or thyroid enlargement  Lungs: No tachypnea, clear without wheezing, rales, or rhonchi  Cardiovascular: RRR, PMI not displaced, heart sounds normal, no murmurs, gallops, bruit, thrill, or heave.  Abdomen: BS normal. Soft without organomegaly, masses, lesions or tenderness.  Extremities: without cyanosis, clubbing or edema. Good distal pulses bilateral  SKin: Warm, no lesions or rashes   Musculoskeletal: No deformities  Neuro: no focal signs   Wt Readings from Last 3 Encounters:  08/08/14 202 lb 8 oz (91.853 kg)  07/24/14 202 lb 6 oz (91.797 kg)  07/22/14 197 lb (89.359 kg)     EKG: Normal sinus rhythm normal EKG

## 2014-08-11 NOTE — Patient Instructions (Signed)
Your physician has requested that you have a cardiac catheterization. Cardiac catheterization is used to diagnose and/or treat various heart conditions. Doctors may recommend this procedure for a number of different reasons. The most common reason is to evaluate chest pain. Chest pain can be a symptom of coronary artery disease (CAD), and cardiac catheterization can show whether plaque is narrowing or blocking your heart's arteries. This procedure is also used to evaluate the valves, as well as measure the blood flow and oxygen levels in different parts of your heart. For further information please visit [email or web address].[email or web address]. Please follow instruction sheet, as given.  Your physician recommends that you have  lab work today: cbc,bmet,inr

## 2014-08-11 NOTE — Assessment & Plan Note (Signed)
Patient had one episode of chest pain and was referred for stress test. Stress test was markedly abnormal and felt to be high-risk. Cardiac catheterization recommended by Dr. Meda Coffee. I discussed the risk and benefits of cardiac catheterization with the patient and his wife who are agreeable to proceed. This will be performed Wednesday 08/13/14. He is advised to go to the emergency room if he has any chest pain before then.

## 2014-08-12 ENCOUNTER — Encounter (HOSPITAL_COMMUNITY): Payer: Self-pay | Admitting: Pharmacy Technician

## 2014-08-13 ENCOUNTER — Encounter (HOSPITAL_COMMUNITY)
Admission: RE | Disposition: A | Discharge status: Home / Self Care | Payer: Self-pay | Source: Ambulatory Visit | Attending: Cardiovascular Disease

## 2014-08-13 ENCOUNTER — Ambulatory Visit (HOSPITAL_COMMUNITY)
Admission: RE | Admit: 2014-08-13 | Discharge: 2014-08-13 | Disposition: A | Discharge status: Home / Self Care | Payer: 59 | Source: Ambulatory Visit | Attending: Cardiovascular Disease | Admitting: Cardiovascular Disease

## 2014-08-13 DIAGNOSIS — Z7982 Long term (current) use of aspirin: Secondary | ICD-10-CM | POA: Diagnosis not present

## 2014-08-13 DIAGNOSIS — E785 Hyperlipidemia, unspecified: Secondary | ICD-10-CM | POA: Insufficient documentation

## 2014-08-13 DIAGNOSIS — R072 Precordial pain: Secondary | ICD-10-CM

## 2014-08-13 DIAGNOSIS — R0789 Other chest pain: Secondary | ICD-10-CM | POA: Insufficient documentation

## 2014-08-13 DIAGNOSIS — R079 Chest pain, unspecified: Secondary | ICD-10-CM | POA: Diagnosis present

## 2014-08-13 HISTORY — PX: LEFT HEART CATHETERIZATION WITH CORONARY ANGIOGRAM: SHX5451

## 2014-08-13 SURGERY — LEFT HEART CATHETERIZATION WITH CORONARY ANGIOGRAM
Anesthesia: LOCAL

## 2014-08-13 MED ORDER — HEPARIN SODIUM (PORCINE) 1000 UNIT/ML IJ SOLN
INTRAMUSCULAR | Status: AC
  Filled 2014-08-13: qty 1

## 2014-08-13 MED ORDER — SODIUM CHLORIDE 0.9 % IV SOLN
250.0000 mL | INTRAVENOUS | Status: DC | PRN
  Administered 2014-08-13: 13:00:00 via INTRAVENOUS

## 2014-08-13 MED ORDER — ASPIRIN 81 MG PO CHEW
CHEWABLE_TABLET | ORAL | Status: AC
  Filled 2014-08-13: qty 1

## 2014-08-13 MED ORDER — ASPIRIN 81 MG PO CHEW
81.0000 mg | CHEWABLE_TABLET | ORAL | Status: AC
  Administered 2014-08-13: 81 mg via ORAL

## 2014-08-13 MED ORDER — SODIUM CHLORIDE 0.9 % IJ SOLN: 3.0000 mL | Freq: Two times a day (BID) | INTRAMUSCULAR | Status: DC

## 2014-08-13 MED ORDER — VERAPAMIL HCL 2.5 MG/ML IV SOLN
INTRAVENOUS | Status: AC
  Filled 2014-08-13: qty 2

## 2014-08-13 MED ORDER — MIDAZOLAM HCL 2 MG/2ML IJ SOLN
INTRAMUSCULAR | Status: AC
  Filled 2014-08-13: qty 2

## 2014-08-13 MED ORDER — SODIUM CHLORIDE 0.9 % IV SOLN: INTRAVENOUS | Status: AC

## 2014-08-13 MED ORDER — FENTANYL CITRATE 0.05 MG/ML IJ SOLN
INTRAMUSCULAR | Status: AC
  Filled 2014-08-13: qty 2

## 2014-08-13 MED ORDER — HEPARIN (PORCINE) IN NACL 2-0.9 UNIT/ML-% IJ SOLN
INTRAMUSCULAR | Status: AC
  Filled 2014-08-13: qty 1000

## 2014-08-13 MED ORDER — SODIUM CHLORIDE 0.9 % IJ SOLN: 3.0000 mL | INTRAMUSCULAR | Status: DC | PRN

## 2014-08-13 MED ORDER — LIDOCAINE HCL (PF) 1 % IJ SOLN
INTRAMUSCULAR | Status: AC
  Filled 2014-08-13: qty 30

## 2014-08-13 NOTE — Interval H&P Note (Signed)
History and Physical Interval Note:  08/13/2014 12:51 PM  Dan Sanchez  has presented today for cardiac cath with the diagnosis of abnormal stress test, chest pain/possible angina.  The various methods of treatment have been discussed with the patient and family. After consideration of risks, benefits and other options for treatment, the patient has consented to  Procedure(s): LEFT HEART CATHETERIZATION WITH CORONARY ANGIOGRAM (N/A) as a surgical intervention .  The patient's history has been reviewed, patient examined, no change in status, stable for surgery.  I have reviewed the patient's chart and labs.  Questions were answered to the patient's satisfaction.    Cath Lab Visit (complete for each Cath Lab visit)  Clinical Evaluation Leading to the Procedure:   ACS: No.  Non-ACS:    Anginal Classification: CCS II  Anti-ischemic medical therapy: No Therapy  Non-Invasive Test Results: High-risk stress test findings: cardiac mortality >3%/year  Prior CABG: No previous CABG        Yolanda Huffstetler

## 2014-08-13 NOTE — CV Procedure (Signed)
      Cardiac Catheterization Operative Report  Dan Sanchez [id number] 9/16/20151:26 PM Annye Asa, MD  Procedure Performed:  1. Left Heart Catheterization 2. Selective Coronary Angiography 3. Left ventricular angiogram  Operator: Lauree Chandler, MD  Arterial access site:  Right radial artery.   Indication: 58 yo male with history of HLD with one episode of chest pain after eating several weeks ago. Exercise treadmill stress test 08/11/14 with ST changes with exercise but no chest pain.                                       Procedure Details: The risks, benefits, complications, treatment options, and expected outcomes were discussed with the patient. The patient and/or family concurred with the proposed plan, giving informed consent. The patient was brought to the cath lab after IV hydration was begun and oral premedication was given. The patient was further sedated with Versed and Fentanyl. The right wrist was assessed with an Allens test which was positive. The right wrist was prepped and draped in a sterile fashion. 1% lidocaine was used for local anesthesia. Using the modified Seldinger access technique, a 5 French sheath was placed in the right radial artery. 3 mg Verapamil was given through the sheath. 4500 units IV heparin was given. Standard diagnostic catheters were used to perform selective coronary angiography. A pigtail catheter was used to perform a left ventricular angiogram. The sheath was removed from the right radial artery and a Terumo hemostasis band was applied at the arteriotomy site on the right wrist.    There were no immediate complications. The patient was taken to the recovery area in stable condition.   Hemodynamic Findings: Central aortic pressure: 137/82 Left ventricular pressure: 144/8/14  Angiographic Findings:  Left main: No obstructive disease.   Left Anterior Descending Artery: Large caliber vessel that courses to the apex. There is a  20% stenosis in the mid vessel. There are mild luminal irregularities in the distal vessel. There are several small caliber diagonal branches.   Circumflex Artery: Large caliber vessel with termination into an obtuse marginal branch. No obstructive disease.   Right Coronary Artery: Large dominant vessel with 20% proximal stenosis.   Left Ventricular Angiogram: LVEF=65-70%.   Impression: 1. Mild non-obstructive CAD 2. Normal LV function 3. Non-cardiac chest pain  Recommendations: Will continue ASA and statin. I will see him in follow up in the office in 3-4 weeks for post cath follow up.        Complications:  None. The patient tolerated the procedure well.

## 2014-08-13 NOTE — H&P (Signed)
Patient ID: Dan Sanchez MRN: [id number] DOB/AGE: 1955/12/03 58 y.o. Admit date: 08/13/2014  HPI: 58 yo male with history of HLD, FH of CAD with recent chest pain, cannot exclude unstable angina. Exercise stress test with ST changes c/w ischemia. Cath arranged by Estella Husk, PA-C and Dr. Ena Dawley.   Review of systems complete and found to be negative unless listed above   Past Medical History  Diagnosis Date  . Hyperlipidemia   . Urinary incontinence   . Allergy     Family History  Problem Relation Age of Onset  . Heart disease Father     MI age 52    History   Social History  . Marital Status: Married    Spouse Name: N/A    Number of Children: N/A  . 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 Smoking status: Never Smoker   . Smokeless tobacco: Not on file  . Alcohol Use: 2.0 oz/week    4 drink(s) per week  . Drug Use: No  . Sexual Activity: Not on file   Other Topics Concern  . Not on file   Social History Narrative  . No narrative on file    Past Surgical History  Procedure Laterality Date  . Shoulder surgery      Right shoulder     No Known Allergies  Prior to Admission Meds:  Prescriptions prior to admission  Medication Sig Dispense Refill  . Ascorbic Acid (VITAMIN C) 1000 MG tablet Take 1,000 mg by mouth daily.      Marland Kitchen aspirin EC 325 MG tablet Take 325 mg by mouth at bedtime.      . fluticasone (FLONASE) 50 MCG/ACT nasal spray Place 2 sprays into both nostrils daily as needed for allergies or rhinitis.      . Multiple Vitamin (MULTIVITAMIN WITH MINERALS) TABS tablet Take 1 tablet by mouth daily.      . Omega-3 Fatty Acids (FISH OIL) 1200 MG CAPS Take 1,200 mg by mouth 2 (two) times daily.      Marland Kitchen omeprazole (PRILOSEC) 20 MG capsule Take 1 capsule (20 mg total) by mouth daily.  30 capsule  6  . simvastatin (ZOCOR) 40 MG tablet Take 40 mg by mouth at bedtime.      .  sildenafil (VIAGRA) 100 MG tablet Take 1 tablet (100 mg total) by mouth daily as needed for erectile dysfunction.  10 tablet  6    Physical Exam: Blood pressure 143/86, pulse 65, temperature 98.6 F (37 C), temperature source Oral, resp. rate 20, height 5\' 11"  (1.803 m), weight 195 lb (88.451 kg), SpO2 100.00%.    General: Well developed, well nourished, NAD  HEENT: OP clear, mucus membranes moist  SKIN: warm, dry. No rashes.  Neuro: No focal deficits  Musculoskeletal: Muscle strength 5/5 all ext  Psychiatric: Mood and affect normal  Neck: No JVD, no carotid bruits, no thyromegaly, no lymphadenopathy.  Lungs:Clear bilaterally, no wheezes, rhonci, crackles  Cardiovascular: Regular rate and rhythm. No murmurs, gallops or rubs.  Abdomen:Soft. Bowel sounds present. Non-tender.  Extremities: No lower extremity edema. Pulses are 2 + in the bilateral DP/PT.  Labs:   Lab Results  Component Value Date   WBC 4.5 08/11/2014   HGB 13.8 08/11/2014   HCT 40.1 08/11/2014   MCV 88.2 08/11/2014   PLT 266.0 08/11/2014    Recent Labs Lab 08/11/14 1529  NA 137  K 4.1  CL 104  CO2 26  BUN 20  CREATININE 1.4  CALCIUM 9.0  GLUCOSE 97     ASSESSMENT AND PLAN:   1. Chest pain: Cannot exclude angina. High risk exercise treadmill stress test. Plans for cardiac cath with possible PCI today.   Darlina Guys, MD 08/13/2014, 12:46 PM

## 2014-08-13 NOTE — Discharge Instructions (Signed)
Radial Site Care °Refer to this sheet in the next few weeks. These instructions provide you with information on caring for yourself after your procedure. Your caregiver may also give you more specific instructions. Your treatment has been planned according to current medical practices, but problems sometimes occur. Call your caregiver if you have any problems or questions after your procedure. °HOME CARE INSTRUCTIONS °· You may shower the day after the procedure. Remove the bandage (dressing) and gently wash the site with plain soap and water. Gently pat the site dry. °· Do not apply powder or lotion to the site. °· Do not submerge the affected site in water for 3 to 5 days. °· Inspect the site at least twice daily. °· Do not flex or bend the affected arm for 24 hours. °· No lifting over 5 pounds (2.3 kg) for 5 days after your procedure. °· Do not drive home if you are discharged the same day of the procedure. Have someone else drive you. °· You may drive 24 hours after the procedure unless otherwise instructed by your caregiver. °· Do not operate machinery or power tools for 24 hours. °· A responsible adult should be with you for the first 24 hours after you arrive home. °What to expect: °· Any bruising will usually fade within 1 to 2 weeks. °· Blood that collects in the tissue (hematoma) may be painful to the touch. It should usually decrease in size and tenderness within 1 to 2 weeks. °SEEK IMMEDIATE MEDICAL CARE IF: °· You have unusual pain at the radial site. °· You have redness, warmth, swelling, or pain at the radial site. °· You have drainage (other than a small amount of blood on the dressing). °· You have chills. °· You have a fever or persistent symptoms for more than 72 hours. °· You have a fever and your symptoms suddenly get worse. °· Your arm becomes pale, cool, tingly, or numb. °· You have heavy bleeding from the site. Hold pressure on the site. CALL 911 °Document Released: 12/17/2010 Document  Revised: 02/06/2012 Document Reviewed: 12/17/2010 °Exit[employer]® Patient Information ©2015 [employer]. This information is not intended to replace advice given to you by your health care provider. Make sure you discuss any questions you have with your health care provider. ° °

## 2014-08-14 ENCOUNTER — Telehealth: Payer: Self-pay | Admitting: *Deleted

## 2014-08-14 NOTE — Telephone Encounter (Signed)
I placed call to Dan Sanchez to schedule post cath follow up with Dr. Angelena Form. Dr. Angelena Form can see Dan Sanchez on September 17, 2014 at 4:30. I left message for Dan Sanchez to call back to schedule appt.

## 2014-08-15 NOTE — Telephone Encounter (Signed)
appt has been scheduled.

## 2014-09-04 ENCOUNTER — Ambulatory Visit: Payer: 59 | Admitting: Medical

## 2014-09-08 ENCOUNTER — Emergency Department
Admission: EM | Admit: 2014-09-08 | Discharge: 2014-09-08 | Disposition: A | Discharge status: Home / Self Care | Payer: 59 | Source: Home / Self Care | Attending: Emergency Medicine | Admitting: Emergency Medicine

## 2014-09-08 ENCOUNTER — Encounter: Payer: Self-pay | Admitting: Emergency Medicine

## 2014-09-08 DIAGNOSIS — T63443A Toxic effect of venom of bees, assault, initial encounter: Secondary | ICD-10-CM

## 2014-09-08 MED ORDER — METHYLPREDNISOLONE SODIUM SUCC 40 MG IJ SOLR
125.0000 mg | Freq: Once | INTRAMUSCULAR | Status: AC
  Administered 2014-09-08: 125 mg via INTRAMUSCULAR

## 2014-09-08 MED ORDER — PREDNISONE 10 MG PO TABS: 10.0000 mg | ORAL_TABLET | Freq: Every day | ORAL | Status: DC

## 2014-09-08 NOTE — ED Notes (Signed)
Pt c/o bee sting to his RT foot with swelling x 2 hours ago. Denies hx of bee allergy, until bee sting on RT hand last year with swellig.

## 2014-09-08 NOTE — Discharge Instructions (Signed)

## 2014-09-08 NOTE — ED Provider Notes (Signed)
CSN: [id number]     Arrival date & time 09/08/14  1649 History   First MD Initiated Contact with Patient 09/08/14 1720     Chief Complaint  Patient presents with  . Insect Bite   (Consider location/radiation/quality/duration/timing/severity/associated sxs/prior Treatment) Patient is a 58 y.o. male presenting with foot injury. The history is provided by the patient. No language interpreter was used.  Foot Injury Location:  Foot Injury: no   Foot location:  R foot Pain details:    Quality:  Aching   Radiates to:  Does not radiate   Severity:  Moderate   Onset quality:  Gradual   Duration:  2 hours   Timing:  Constant   Progression:  Worsening Chronicity:  New Foreign body present:  No foreign bodies Relieved by:  Nothing Worsened by:  Activity Ineffective treatments:  None tried Pt was stung by a bee.  Pt complains of swelling to his foot,  Past Medical History  Diagnosis Date  . Hyperlipidemia   . Urinary incontinence   . Allergy    Past Surgical History  Procedure Laterality Date  . Shoulder surgery      Right shoulder    Family History  Problem Relation Age of Onset  . Heart disease Father     MI age 83   History  Substance Use Topics  . Smoking status: Never Smoker   . Smokeless tobacco: Not on file  . Alcohol Use: 2.0 oz/week    4 drink(s) per week    Review of Systems  All other systems reviewed and are negative.   Allergies  Review of patient's allergies indicates no known allergies.  Home Medications   Prior to Admission medications   Medication Sig Start Date End Date Taking? Authorizing Provider  Ascorbic Acid (VITAMIN C) 1000 MG tablet Take 1,000 mg by mouth daily.    Historical Provider, MD  aspirin EC 325 MG tablet Take 325 mg by mouth at bedtime.    Historical Provider, MD  fluticasone (FLONASE) 50 MCG/ACT nasal spray Place 2 sprays into both nostrils daily as needed for allergies or rhinitis.    Historical Provider, MD  Multiple Vitamin  (MULTIVITAMIN WITH MINERALS) TABS tablet Take 1 tablet by mouth daily.    Historical Provider, MD  Omega-3 Fatty Acids (FISH OIL) 1200 MG CAPS Take 1,200 mg by mouth 2 (two) times daily.    Historical Provider, MD  omeprazole (PRILOSEC) 20 MG capsule Take 1 capsule (20 mg total) by mouth daily. 02/20/14   Midge Minium, MD  predniSONE (DELTASONE) 10 MG tablet Take 1 tablet (10 mg total) by mouth daily. 09/08/14   Fransico Meadow, PA-C  sildenafil (VIAGRA) 100 MG tablet Take 1 tablet (100 mg total) by mouth daily as needed for erectile dysfunction. 02/03/14   Midge Minium, MD  simvastatin (ZOCOR) 40 MG tablet Take 40 mg by mouth at bedtime.    Historical Provider, MD   BP 156/93  Pulse 71  Temp(Src) 98 F (36.7 C) (Oral)  Resp 18  Ht 5\' 11"  (1.803 m)  Wt 202 lb (91.627 kg)  BMI 28.19 kg/m2  SpO2 98% Physical Exam  Nursing note reviewed. Constitutional: He is oriented to person, place, and time. He appears well-developed and well-nourished.  HENT:  Head: Normocephalic.  Cardiovascular: Normal rate.   Pulmonary/Chest: Effort normal.  Musculoskeletal: He exhibits tenderness.  Swollen foot, erythematous  Neurological: He is alert and oriented to person, place, and time.  Skin: Skin is warm.  Psychiatric: He has a normal mood and affect.    ED Course  Procedures (including critical care time) Labs Review Labs Reviewed - No data to display  Imaging Review No results found.   MDM   1. Bee sting, assault, initial encounter    Solumedrol Benadryl Prednisone taper AVS    Fransico Meadow, PA-C 09/08/14 [place], [place] 09/08/14 1903

## 2014-09-10 ENCOUNTER — Ambulatory Visit (INDEPENDENT_AMBULATORY_CARE_PROVIDER_SITE_OTHER): Payer: 59 | Admitting: Family Medicine

## 2014-09-10 ENCOUNTER — Encounter: Payer: Self-pay | Admitting: Family Medicine

## 2014-09-10 VITALS — BP 130/80 | HR 72 | Temp 98.2°F | Resp 16 | Wt 203.4 lb

## 2014-09-10 DIAGNOSIS — J0101 Acute recurrent maxillary sinusitis: Secondary | ICD-10-CM

## 2014-09-10 DIAGNOSIS — Z91030 Bee allergy status: Secondary | ICD-10-CM | POA: Insufficient documentation

## 2014-09-10 DIAGNOSIS — Z91038 Other insect allergy status: Secondary | ICD-10-CM

## 2014-09-10 MED ORDER — AMOXICILLIN 875 MG PO TABS: 875.0000 mg | ORAL_TABLET | Freq: Two times a day (BID) | ORAL | Status: AC

## 2014-09-10 MED ORDER — EPINEPHRINE 0.3 MG/0.3ML IJ SOAJ: 0.3000 mg | Freq: Once | INTRAMUSCULAR | Status: DC

## 2014-09-10 NOTE — Progress Notes (Signed)
   Subjective:    Patient ID: Dan Sanchez, male    DOB: 01-02-1956, 58 y.o.   MRN: [id number]  Sinusitis Associated symptoms include headaches.  Headache    URI- sxs started ~2 weeks ago.  Nasal congestion, facial pain, tooth pain.  No N/V/D.  No fever.  + ear pain.  No cough.  Bee allergy- pt stung on foot and it swelled excessively.  Pt concerned about worsening reaction w/ next sting.  Review of Systems  Neurological: Positive for headaches.   For ROS see HPI     Objective:   Physical Exam  Vitals reviewed. Constitutional: He appears well-developed and well-nourished. No distress.  HENT:  Head: Normocephalic and atraumatic.  Right Ear: Tympanic membrane normal.  Left Ear: Tympanic membrane normal.  Nose: Mucosal edema and rhinorrhea present. Right sinus exhibits maxillary sinus tenderness and frontal sinus tenderness. Left sinus exhibits maxillary sinus tenderness and frontal sinus tenderness.  Mouth/Throat: Mucous membranes are normal. Oropharyngeal exudate and posterior oropharyngeal erythema present. No posterior oropharyngeal edema.  + PND  Eyes: Conjunctivae and EOM are normal. Pupils are equal, round, and reactive to light.  Neck: Normal range of motion. Neck supple.  Cardiovascular: Normal rate, regular rhythm and normal heart sounds.   Pulmonary/Chest: Effort normal and breath sounds normal. No respiratory distress. He has no wheezes.  Lymphadenopathy:    He has no cervical adenopathy.  Skin: Skin is warm and dry.          Assessment & Plan:

## 2014-09-10 NOTE — Progress Notes (Signed)
Pre visit review using our clinic review tool, if applicable. No additional management support is needed unless otherwise documented below in the visit note. 

## 2014-09-10 NOTE — Patient Instructions (Signed)
Follow up as needed Start the Amoxicillin twice daily- take w/ food Drink plenty of fluids Claritin or Zyrtec daily Wear a filter mask while doing yard work Use the Epipen only for facial swelling, throat swelling, or difficulty breathing Call with any questions or concerns Hang in there!

## 2014-09-11 NOTE — ED Provider Notes (Signed)
Patient improved in urgent care after Solu-Medrol and Benadryl given. Vital signs remained stable Red flags discussed, and was felt to be stable at time of discharge but precautions discussed. An After Visit Summary was printed and given to the patient.    Jacqulyn Cane, MD 09/11/14 [phone]

## 2014-09-14 NOTE — Assessment & Plan Note (Signed)
Recurrent problem for pt.  Hx and PE consistent w/ infxn.  Start abx.  Reviewed supportive care and red flags that should prompt return.  Pt expressed understanding and is in agreement w/ plan.

## 2014-09-14 NOTE — Assessment & Plan Note (Signed)
New.  Pt reports his last 2 exposures have resulted in immediate swelling not improved by benadryl and required ER/UC visits.  Pt fears that this reaction will continue to progress.  Reviewed that Epipens are only to be used for airway issues or facial swelling that is compromising his ability to breathe.  Pt expressed understanding and is in agreement w/ plan.

## 2014-09-17 ENCOUNTER — Ambulatory Visit (INDEPENDENT_AMBULATORY_CARE_PROVIDER_SITE_OTHER): Payer: 59 | Admitting: Cardiovascular Disease

## 2014-09-17 ENCOUNTER — Encounter: Payer: Self-pay | Admitting: Cardiovascular Disease

## 2014-09-17 VITALS — BP 138/80 | HR 72 | Ht 71.0 in | Wt 206.0 lb

## 2014-09-17 DIAGNOSIS — E785 Hyperlipidemia, unspecified: Secondary | ICD-10-CM

## 2014-09-17 DIAGNOSIS — I2510 Atherosclerotic heart disease of native coronary artery without angina pectoris: Secondary | ICD-10-CM

## 2014-09-17 MED ORDER — ROSUVASTATIN CALCIUM 20 MG PO TABS
20.0000 mg | ORAL_TABLET | Freq: Every day | ORAL | Status: DC
Start: 2014-09-17 — End: 2015-03-27

## 2014-09-17 NOTE — Progress Notes (Signed)
History of Present Illness: 58 yo male with history of CAD, HLD here today for cardiac follow up. I met him in the cath lab in September 2015. He had an abnormal outpatient stress test arranged by primary care. During the GXT he had ST elevation in AVR, AVL. No chest pain. Cardiac cath 08/13/14 with mild disease in the LAD (20%) and RCA (20%). LVEF normal. No chest pain or SOB. Very active. No complaints today.   Primary Care Physician: Birdie Riddle  Last Lipid Profile:Lipid Panel     Component Value Date/Time   CHOL 189 02/03/2014 0843   TRIG 94.0 02/03/2014 0843   HDL 57.10 02/03/2014 0843   CHOLHDL 3 02/03/2014 0843   VLDL 18.8 02/03/2014 0843   LDLCALC 113* 02/03/2014 0843    Past Medical History  Diagnosis Date  . Hyperlipidemia   . Urinary incontinence   . Allergy     Past Surgical History  Procedure Laterality Date  . Shoulder surgery      Right shoulder     Current Outpatient Prescriptions  Medication Sig Dispense Refill  . amoxicillin (AMOXIL) 875 MG tablet Take 1 tablet (875 mg total) by mouth 2 (two) times daily.  20 tablet  0  . Ascorbic Acid (VITAMIN C) 1000 MG tablet Take 1,000 mg by mouth daily.      Marland Kitchen aspirin EC 325 MG tablet Take 325 mg by mouth at bedtime.      Marland Kitchen EPINEPHrine (EPIPEN 2-PAK) 0.3 mg/0.3 mL IJ SOAJ injection Inject 0.3 mLs (0.3 mg total) into the muscle once.  4 Device  1  . fluticasone (FLONASE) 50 MCG/ACT nasal spray Place 2 sprays into both nostrils daily as needed for allergies or rhinitis.      . Multiple Vitamin (MULTIVITAMIN WITH MINERALS) TABS tablet Take 1 tablet by mouth daily.      . Omega-3 Fatty Acids (FISH OIL) 1200 MG CAPS Take 1,200 mg by mouth 2 (two) times daily.      Marland Kitchen omeprazole (PRILOSEC) 20 MG capsule Take 1 capsule (20 mg total) by mouth daily.  30 capsule  6  . predniSONE (DELTASONE) 10 MG tablet Take 1 tablet (10 mg total) by mouth daily.  21 tablet  0  . sildenafil (VIAGRA) 100 MG tablet Take 1 tablet (100 mg total) by mouth daily  as needed for erectile dysfunction.  10 tablet  6  . simvastatin (ZOCOR) 40 MG tablet Take 40 mg by mouth at bedtime.       No current facility-administered medications for this visit.    No Known Allergies  History   Social History  . Marital Status: Married    Spouse Name: N/A    Number of Children: N/A  . 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 Smoking status: Never Smoker   . Smokeless tobacco: Not on file  . Alcohol Use: 2.0 oz/week    4 drink(s) per week  . Drug Use: No  . Sexual Activity: Not on file   Other Topics Concern  . Not on file   Social History Narrative  . No narrative on file    Family History  Problem Relation Age of Onset  . Heart disease Father     MI age 23    Review of Systems:  As stated in the HPI and otherwise negative.   BP 138/80  Pulse 72  Ht $R'5\' 11"'xv$  (  1.803 m)  Wt 206 lb (93.441 kg)  BMI 28.74 kg/m2  Physical Examination: General: Well developed, well nourished, NAD HEENT: OP clear, mucus membranes moist SKIN: warm, dry. No rashes. Neuro: No focal deficits Musculoskeletal: Muscle strength 5/5 all ext Psychiatric: Mood and affect normal Neck: No JVD, no carotid bruits, no thyromegaly, no lymphadenopathy. Lungs:Clear bilaterally, no wheezes, rhonci, crackles Cardiovascular: Regular rate and rhythm. No murmurs, gallops or rubs. Abdomen:Soft. Bowel sounds present. Non-tender.  Extremities: No lower extremity edema. Pulses are 2 + in the bilateral DP/PT.  Cardiac cath 08/13/14: Hemodynamic Findings:  Central aortic pressure: 137/82  Left ventricular pressure: 144/8/14  Angiographic Findings:  Left main: No obstructive disease.  Left Anterior Descending Artery: Large caliber vessel that courses to the apex. There is a 20% stenosis in the mid vessel. There are mild luminal irregularities in the distal vessel. There are several small caliber diagonal branches.    Circumflex Artery: Large caliber vessel with termination into an obtuse marginal branch. No obstructive disease.  Right Coronary Artery: Large dominant vessel with 20% proximal stenosis.  Left Ventricular Angiogram: LVEF=65-70%.   Assessment and Plan:   1. CAD: Recent cath with mild disease in the LAD and RCA. Will change simvastatin to Crestor 20 mg daily with goal LDL of 70. Will give samples today and repeat lipids and LFTs in 12 weeks.   2. HLD: Change to Crestor as above.

## 2014-09-17 NOTE — Patient Instructions (Signed)
Your physician wants you to follow-up in:  6 months.  You will receive a reminder letter in the mail two months in advance. If you don't receive a letter, please call our office to schedule the follow-up appointment.  Your physician recommends that you return for fasting lab work on January 15,2016. The lab opens at 7:30 AM.  Your physician has recommended you make the following change in your medication: Stop Simvastatin. Start Crestor 20 mg by mouth daily.

## 2014-11-06 ENCOUNTER — Encounter (HOSPITAL_COMMUNITY): Payer: Self-pay | Admitting: Cardiovascular Disease

## 2014-12-12 ENCOUNTER — Other Ambulatory Visit (INDEPENDENT_AMBULATORY_CARE_PROVIDER_SITE_OTHER): Payer: 59 | Admitting: *Deleted

## 2014-12-12 ENCOUNTER — Ambulatory Visit (INDEPENDENT_AMBULATORY_CARE_PROVIDER_SITE_OTHER): Payer: 59 | Admitting: Family Medicine

## 2014-12-12 ENCOUNTER — Encounter: Payer: Self-pay | Admitting: Family Medicine

## 2014-12-12 VITALS — BP 130/80 | HR 91 | Temp 98.2°F | Resp 16 | Wt 202.5 lb

## 2014-12-12 DIAGNOSIS — E785 Hyperlipidemia, unspecified: Secondary | ICD-10-CM

## 2014-12-12 DIAGNOSIS — J0101 Acute recurrent maxillary sinusitis: Secondary | ICD-10-CM

## 2014-12-12 LAB — HEPATIC FUNCTION PANEL
ALK PHOS: 62 U/L (ref 39–117)
ALT: 31 U/L (ref 0–53)
AST: 22 U/L (ref 0–37)
Albumin: 4 g/dL (ref 3.5–5.2)
BILIRUBIN DIRECT: 0.1 mg/dL (ref 0.0–0.3)
BILIRUBIN TOTAL: 0.5 mg/dL (ref 0.2–1.2)
Total Protein: 7 g/dL (ref 6.0–8.3)

## 2014-12-12 LAB — LIPID PANEL
Cholesterol: 130 mg/dL (ref 0–200)
HDL: 52.5 mg/dL (ref >39.00)
LDL Cholesterol: 61 mg/dL (ref 0–99)
NonHDL: 77.5
TRIGLYCERIDES: 81 mg/dL (ref 0.0–149.0)
Total CHOL/HDL Ratio: 2
VLDL: 16.2 mg/dL (ref 0.0–40.0)

## 2014-12-12 MED ORDER — AMOXICILLIN 875 MG PO TABS: 875.0000 mg | ORAL_TABLET | Freq: Two times a day (BID) | ORAL | Status: DC

## 2014-12-12 MED ORDER — PROMETHAZINE-DM 6.25-15 MG/5ML PO SYRP: 5.0000 mL | ORAL_SOLUTION | Freq: Four times a day (QID) | ORAL | Status: DC | PRN

## 2014-12-12 NOTE — Progress Notes (Signed)
Pre visit review using our clinic review tool, if applicable. No additional management support is needed unless otherwise documented below in the visit note. 

## 2014-12-12 NOTE — Patient Instructions (Signed)
Follow up as needed Start the Amoxicillin twice daily- take w/ food Drink plenty of fluids Use the cough syrup as needed Mucinex DM for daytime cough REST! Call with any questions or concerns Hang in there!!

## 2014-12-12 NOTE — Progress Notes (Signed)
   Subjective:    Patient ID: Dan Sanchez, male    DOB: 1956/07/12, 59 y.o.   MRN: [id number]  HPI URI- 'i can't breathe'.  Chronic problem for pt but 'it got worse' as of Tuesday.  + hacking cough, sinus pressure.  No fevers.  No ear pain.  No N/V, tooth pain.  Hx of recurrent sinus infxns.   Review of Systems For ROS see HPI     Objective:   Physical Exam  Constitutional: He appears well-developed and well-nourished. No distress.  HENT:  Head: Normocephalic and atraumatic.  Right Ear: Tympanic membrane normal.  Left Ear: Tympanic membrane normal.  Nose: Mucosal edema and rhinorrhea present. Right sinus exhibits maxillary sinus tenderness and frontal sinus tenderness. Left sinus exhibits maxillary sinus tenderness and frontal sinus tenderness.  Mouth/Throat: Mucous membranes are normal. Oropharyngeal exudate and posterior oropharyngeal erythema present. No posterior oropharyngeal edema.  + PND  Eyes: Conjunctivae and EOM are normal. Pupils are equal, round, and reactive to light.  Neck: Normal range of motion. Neck supple.  Cardiovascular: Normal rate, regular rhythm and normal heart sounds.   Pulmonary/Chest: Effort normal and breath sounds normal. No respiratory distress. He has no wheezes.  + hacking cough  Lymphadenopathy:    He has no cervical adenopathy.  Skin: Skin is warm and dry.  Vitals reviewed.         Assessment & Plan:

## 2014-12-12 NOTE — Assessment & Plan Note (Signed)
[name] sxs and PE consistent w/ infxn.  Pt has hx of similar.  Start abx.  Cough meds prn.  Reviewed supportive care and red flags that should prompt return.  Pt expressed understanding and is in agreement w/ plan.

## 2015-03-13 ENCOUNTER — Ambulatory Visit (INDEPENDENT_AMBULATORY_CARE_PROVIDER_SITE_OTHER): Payer: 59 | Admitting: Family Medicine

## 2015-03-13 ENCOUNTER — Encounter: Payer: Self-pay | Admitting: Family Medicine

## 2015-03-13 VITALS — BP 126/80 | HR 77 | Temp 98.3°F | Resp 16 | Wt 200.2 lb

## 2015-03-13 DIAGNOSIS — S46912A Strain of unspecified muscle, fascia and tendon at shoulder and upper arm level, left arm, initial encounter: Secondary | ICD-10-CM

## 2015-03-13 DIAGNOSIS — G4700 Insomnia, unspecified: Secondary | ICD-10-CM

## 2015-03-13 MED ORDER — MELOXICAM 15 MG PO TABS: 15.0000 mg | ORAL_TABLET | Freq: Every day | ORAL | Status: DC

## 2015-03-13 MED ORDER — TRAZODONE HCL 50 MG PO TABS: 25.0000 mg | ORAL_TABLET | Freq: Every evening | ORAL | Status: DC | PRN

## 2015-03-13 NOTE — Patient Instructions (Signed)
Follow up as needed Your pain is consistent w/ muscle strain/tendonitis Start the Mobic once daily- take w/ food- for inflammation Ice after activity If no improvement after 10-14 days, let me know so we can proceed with the workup Start the Trazodone as needed for sleep- start w/ 1/2 tab and increase to 1 tab as needed Call with any questions or concerns Hang in there!

## 2015-03-13 NOTE — Assessment & Plan Note (Addendum)
New.  [name] pain in L axilla is consistent w/ muscle strain or tendinitis of either long head of biceps or long head of triceps.  No lymphadenopathy present.  Start scheduled NSAID.  Reviewed supportive care and red flags that should prompt return.  Pt expressed understanding and is in agreement w/ plan.

## 2015-03-13 NOTE — Progress Notes (Signed)
Pre visit review using our clinic review tool, if applicable. No additional management support is needed unless otherwise documented below in the visit note. 

## 2015-03-13 NOTE — Progress Notes (Signed)
   Subjective:    Patient ID: Dan Sanchez, male    DOB: 1955-12-21, 59 y.o.   MRN: [id number]  HPI Pain in L arm pit- sxs started ~1 month ago.  Pt has hx of previous injury years ago- torn bicep.  Having pain w/ heavy lifting.  Played tennis last night- no pain while playing but has pain afterwards.  No radiation of pain into chest.  Pain is intermittent- 'no rhyme or reason'.  No lump present.  Pt reports prior to pain starting, he was doing push-up challenge and had built up to quite a lot of pushups daily.  Insomnia- ongoing issue for pt.  Doesn't want to take Ambien.  Travels frequently and has difficulty both falling asleep and staying asleep.   Review of Systems For ROS see HPI     Objective:   Physical Exam  Constitutional: He is oriented to person, place, and time. He appears well-developed and well-nourished. No distress.  HENT:  Head: Normocephalic and atraumatic.  Cardiovascular: Intact distal pulses.   Musculoskeletal: He exhibits tenderness (over L mid-axillary over tendinous structure (long head of biceps vs long head of triceps) ). He exhibits no edema.  Full ROM of L shoulder No TTP over pec major or minor  Lymphadenopathy:       Left axillary: No pectoral and no lateral adenopathy present. Neurological: He is alert and oriented to person, place, and time. He has normal reflexes. No cranial nerve deficit. Coordination normal.  Skin: Skin is warm and dry. No erythema.  Psychiatric: He has a normal mood and affect. His behavior is normal. Thought content normal.  Vitals reviewed.         Assessment & Plan:

## 2015-03-13 NOTE — Assessment & Plan Note (Signed)
Ongoing issue for pt.  Not willing to take Ambien due to possible side effects.  Willing to try Trazodone and see if this improves his sleep- particularly during travel.  Will follow.

## 2015-03-26 NOTE — Progress Notes (Signed)
Chief Complaint  Patient presents with  . Chest Pain    History of Present Illness: 59 yo male with history of CAD, HLD here today for cardiac follow up. I met him in the cath lab in September 2015. He had an abnormal outpatient stress test arranged by primary care. During the GXT he had ST elevation in AVR, AVL. No chest pain. Cardiac cath 08/13/14 with mild disease in the LAD (20%) and RCA (20%). LVEF normal.   He is here today for follow up. No chest pain or SOB. Very active. No complaints today. He is very active.   Primary Care Physician: Birdie Riddle  Last Lipid Profile:Lipid Panel     Component Value Date/Time   CHOL 130 12/12/2014 0807   TRIG 81.0 12/12/2014 0807   TRIG 81 10/04/2006 1227   HDL 52.50 12/12/2014 0807   CHOLHDL 2 12/12/2014 0807   CHOLHDL 2.5 CALC 10/04/2006 1227   VLDL 16.2 12/12/2014 0807   LDLCALC 61 12/12/2014 0807    Past Medical History  Diagnosis Date  . Hyperlipidemia   . Urinary incontinence   . Allergy   . CAD (coronary artery disease)     Past Surgical History  Procedure Laterality Date  . Shoulder surgery      Right shoulder   . Left heart catheterization with coronary angiogram N/A 08/13/2014    Procedure: LEFT HEART CATHETERIZATION WITH CORONARY ANGIOGRAM;  Surgeon: Burnell Blanks, MD;  Location: [facility] CATH LAB;  Service: Cardiovascular;  Laterality: N/A;    Current Outpatient Prescriptions  Medication Sig Dispense Refill  . Ascorbic Acid (VITAMIN C) 1000 MG tablet Take 1,000 mg by mouth daily.    Marland Kitchen aspirin EC 325 MG tablet Take 325 mg by mouth at bedtime.    . cetirizine (ZYRTEC) 10 MG tablet Take 10 mg by mouth daily.    Marland Kitchen EPINEPHrine (EPIPEN 2-PAK) 0.3 mg/0.3 mL IJ SOAJ injection Inject 0.3 mLs (0.3 mg total) into the muscle once. 4 Device 1  . fluticasone (FLONASE) 50 MCG/ACT nasal spray Place 2 sprays into both nostrils daily as needed for allergies or rhinitis.    . meloxicam (MOBIC) 15 MG tablet Take 1 tablet (15 mg total)  by mouth daily. 30 tablet 1  . Multiple Vitamin (MULTIVITAMIN WITH MINERALS) TABS tablet Take 1 tablet by mouth daily.    . Omega-3 Fatty Acids (FISH OIL) 1200 MG CAPS Take 1,200 mg by mouth 2 (two) times daily.    Marland Kitchen omeprazole (PRILOSEC) 20 MG capsule Take 1 capsule (20 mg total) by mouth daily. 30 capsule 6  . rosuvastatin (CRESTOR) 20 MG tablet Take 1 tablet (20 mg total) by mouth daily. 90 tablet 3  . sildenafil (VIAGRA) 100 MG tablet Take 1 tablet (100 mg total) by mouth daily as needed for erectile dysfunction. 10 tablet 6  . traZODone (DESYREL) 50 MG tablet Take 0.5-1 tablets (25-50 mg total) by mouth at bedtime as needed for sleep. 30 tablet 3   No current facility-administered medications for this visit.    No Known Allergies  History   Social History  . Marital Status: Married    Spouse Name: N/A  . Number of Children: N/A  . 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 Smoking status: Never Smoker   . Smokeless tobacco: Not on file  . Alcohol Use: 2.0 oz/week    4 drink(s) per week  . Drug  Use: No  . Sexual Activity: Not on file   Other Topics Concern  . Not on file   Social History Narrative    Family History  Problem Relation Age of Onset  . Heart disease Father     MI age 35    Review of Systems:  As stated in the HPI and otherwise negative.   BP 144/82 mmHg  Pulse 67  Ht _0  (1.803 m)  Wt 201 lb 1.9 oz (91.227 kg)  BMI 28.06 kg/m2  Physical Examination: General: Well developed, well nourished, NAD HEENT: OP clear, mucus membranes moist SKIN: warm, dry. No rashes. Neuro: No focal deficits Musculoskeletal: Muscle strength 5/5 all ext Psychiatric: Mood and affect normal Neck: No JVD, no carotid bruits, no thyromegaly, no lymphadenopathy. Lungs:Clear bilaterally, no wheezes, rhonci, crackles Cardiovascular: Regular rate and rhythm. No murmurs, gallops or rubs. Abdomen:Soft. Bowel  sounds present. Non-tender.  Extremities: No lower extremity edema. Pulses are 2 + in the bilateral DP/PT.  Cardiac cath 08/13/14: Hemodynamic Findings:  Central aortic pressure: 137/82  Left ventricular pressure: 144/8/14  Angiographic Findings:  Left main: No obstructive disease.  Left Anterior Descending Artery: Large caliber vessel that courses to the apex. There is a 20% stenosis in the mid vessel. There are mild luminal irregularities in the distal vessel. There are several small caliber diagonal branches.  Circumflex Artery: Large caliber vessel with termination into an obtuse marginal branch. No obstructive disease.  Right Coronary Artery: Large dominant vessel with 20% proximal stenosis.  Left Ventricular Angiogram: LVEF=65-70%.   EKG:  EKG is not ordered today. The ekg ordered today demonstrates   Recent Labs: 08/11/2014: BUN 20; Creatinine 1.4; Hemoglobin 13.8; Platelets 266.0; Potassium 4.1; Sodium 137 12/12/2014: ALT 31   Lipid Panel    Component Value Date/Time   CHOL 130 12/12/2014 0807   TRIG 81.0 12/12/2014 0807   TRIG 81 10/04/2006 1227   HDL 52.50 12/12/2014 0807   CHOLHDL 2 12/12/2014 0807   CHOLHDL 2.5 CALC 10/04/2006 1227   VLDL 16.2 12/12/2014 0807   LDLCALC 61 12/12/2014 0807   LDLDIRECT 120.9 03/16/2012 1054     Wt Readings from Last 3 Encounters:  03/27/15 201 lb 1.9 oz (91.227 kg)  03/13/15 200 lb 4 oz (90.833 kg)  12/12/14 202 lb 8 oz (91.853 kg)     Other studies Reviewed: Additional studies/ records that were reviewed today include: . Review of the above records demonstrates:    Assessment and Plan:   1. CAD: Recent cath with mild disease in the LAD and RCA. Will continue statin. Lower ASA to 81 mg.    2. HLD: Continue Crestor as above.   Current medicines are reviewed at length with the patient today.  The patient does not have concerns regarding medicines.  The following changes have been made:  no change  Labs/ tests ordered today  include:  No orders of the defined types were placed in this encounter.    Disposition:   FU with me in 12  months  Signed, Lauree Chandler, MD 03/27/2015 8:38 AM    [facility] [address], [place], [place]  [address] Phone: [phone]; Fax: [phone]

## 2015-03-27 ENCOUNTER — Encounter: Payer: Self-pay | Admitting: Cardiovascular Disease

## 2015-03-27 ENCOUNTER — Ambulatory Visit (INDEPENDENT_AMBULATORY_CARE_PROVIDER_SITE_OTHER): Payer: 59 | Admitting: Cardiovascular Disease

## 2015-03-27 VITALS — BP 144/82 | HR 67 | Ht 71.0 in | Wt 201.1 lb

## 2015-03-27 DIAGNOSIS — I2510 Atherosclerotic heart disease of native coronary artery without angina pectoris: Secondary | ICD-10-CM

## 2015-03-27 DIAGNOSIS — E785 Hyperlipidemia, unspecified: Secondary | ICD-10-CM

## 2015-03-27 MED ORDER — ROSUVASTATIN CALCIUM 20 MG PO TABS: 20.0000 mg | ORAL_TABLET | Freq: Every day | ORAL | Status: DC

## 2015-03-27 NOTE — Patient Instructions (Signed)
Medication Instructions:  Your physician recommends that you continue on your current medications as directed. Please refer to the Current Medication list given to you today.   Labwork: none  Testing/Procedures: none  Follow-Up: Your physician wants you to follow-up in:  12 months.  You will receive a reminder letter in the mail two months in advance. If you don't receive a letter, please call our office to schedule the follow-up appointment.        

## 2015-04-05 ENCOUNTER — Encounter: Payer: Self-pay | Admitting: Emergency Medicine

## 2015-04-05 ENCOUNTER — Emergency Department (INDEPENDENT_AMBULATORY_CARE_PROVIDER_SITE_OTHER)
Admission: EM | Admit: 2015-04-05 | Discharge: 2015-04-05 | Disposition: A | Discharge status: Home / Self Care | Payer: 59 | Source: Home / Self Care | Attending: Family Medicine | Admitting: Family Medicine

## 2015-04-05 DIAGNOSIS — H15102 Unspecified episcleritis, left eye: Secondary | ICD-10-CM

## 2015-04-05 MED ORDER — DICLOFENAC SODIUM 0.1 % OP SOLN: OPHTHALMIC | Status: DC

## 2015-04-05 NOTE — ED Notes (Signed)
Reports itching and watering of left eye x 2 days; started after doing yard work but does not believe foreign body got into eye. Denies pain.

## 2015-04-05 NOTE — Discharge Instructions (Signed)
Try using refrigerated lubricating eye drops two or three times daily.   Scleritis and Episcleritis The outer part of the eyeball is covered with a tough fibrous covering called the sclera. It is the white part of the eye. This tough covering also has a thin membrane lying on top of it called the episclera.   When the sclera becomes red and sore (inflamed), it is called scleritis.  When the episclera becomes inflamed, it is called episcleritis. CAUSES   Scleritis is usually more severe and is associated with autoimmune diseases such as:  Rheumatoid arthritis.  Inflammations of the bowel such as Crohn's Disease (regional enteritis).  Ulcerative colitis.  Episcleritis usually has no known cause. SYMPTOMS  Both scleritis and episcleritis cause red patches or a nodule on the eye. DIAGNOSIS  This condition should be examined by an ophthalmologist. This is because very strong medications that have side effects to the body and eye may be required to treat severe attacks. Further investigations into the patient's general health may be necessary. TREATMENT   Episcleritis tends to get better without treatment within a week or two.  Scleritis is more severe. Often, your caregiver will prescribe steroids by mouth (orally) or as drops in the eye. This treatment helps lessen the redness and soreness (inflammation). HOME CARE INSTRUCTIONS   Take all medications as directed.  Keep your follow-up appointments as directed.  Avoid irritation of the involved eye(s).  Stop using hard or soft contact lenses until your caregiver tells you that it is safe to use them. SEEK MEDICAL CARE IF:   Redness or irritation gets worse.  You develop pain or sensitivity to light.  You develop any change in vision in the involved eye(s). Document Released: 11/08/2001 Document Revised: 02/06/2012 Document Reviewed: 03/12/2009 [facility] Patient Information 2015 [place], [place]. This information is not intended  to replace advice given to you by your health care provider. Make sure you discuss any questions you have with your health care provider.

## 2015-04-05 NOTE — ED Provider Notes (Signed)
CSN: [id number]     Arrival date & time 04/05/15  1554 History   First MD Initiated Contact with Patient 04/05/15 1658     Chief Complaint  Patient presents with  . Eye Drainage      HPI Comments: Two days ago after doing yard work, patient developed itching and increased lacrimation in his left eye that has persisted.  He recalls no foreign body to the left eye, and does not have a foreign body sensation.  He denies eye pain, swelling, changes in vision, or light sensitivity.  He has tried lubricating eye drops without improvement.  Patient is a 59 y.o. male presenting with eye problem. The history is provided by the patient.  Eye Problem Quality: itching and increased lacrimation. Severity:  Mild Onset quality:  Gradual Duration:  2 days Timing:  Constant Progression:  Unchanged Context: not burn, not chemical exposure, not contact lens problem, not direct trauma, not foreign body and not scratch   Relieved by:  Nothing Worsened by:  Nothing tried Ineffective treatments: lubricating eye drops. Associated symptoms: discharge, itching, redness and tearing   Associated symptoms: no blurred vision, no crusting, no decreased vision, no double vision, no facial rash, no foreign body sensation, no headaches, no photophobia, no scotomas and no swelling   Risk factors: not exposed to pinkeye, no previous injury to eye and no recent URI     Past Medical History  Diagnosis Date  . Hyperlipidemia   . Urinary incontinence   . Allergy   . CAD (coronary artery disease)    Past Surgical History  Procedure Laterality Date  . Shoulder surgery      Right shoulder   . Left heart catheterization with coronary angiogram N/A 08/13/2014    Procedure: LEFT HEART CATHETERIZATION WITH CORONARY ANGIOGRAM;  Surgeon: Burnell Blanks, MD;  Location: [facility] CATH LAB;  Service: Cardiovascular;  Laterality: N/A;   Family History  Problem Relation Age of Onset  . Heart disease Father     MI age 36    History  Substance Use Topics  . Smoking status: Never Smoker   . Smokeless tobacco: Not on file  . Alcohol Use: 2.0 oz/week    4 drink(s) per week    Review of Systems  Eyes: Positive for discharge, redness and itching. Negative for blurred vision, double vision and photophobia.  Neurological: Negative for headaches.  All other systems reviewed and are negative.   Allergies  Review of patient's allergies indicates no known allergies.  Home Medications   Prior to Admission medications   Medication Sig Start Date End Date Taking? Authorizing Provider  Ascorbic Acid (VITAMIN C) 1000 MG tablet Take 1,000 mg by mouth daily.    Historical Provider, MD  aspirin EC 325 MG tablet Take 325 mg by mouth at bedtime.    Historical Provider, MD  cetirizine (ZYRTEC) 10 MG tablet Take 10 mg by mouth daily.    Historical Provider, MD  diclofenac (VOLTAREN) 0.1 % ophthalmic solution Apply one drop in left eye 2 to 3 times daily for approximately two weeks until improved 04/05/15   Kandra Nicolas, MD  EPINEPHrine (EPIPEN 2-PAK) 0.3 mg/0.3 mL IJ SOAJ injection Inject 0.3 mLs (0.3 mg total) into the muscle once. 09/10/14   Midge Minium, MD  fluticasone (FLONASE) 50 MCG/ACT nasal spray Place 2 sprays into both nostrils daily as needed for allergies or rhinitis.    Historical Provider, MD  meloxicam (MOBIC) 15 MG tablet Take 1 tablet (15  mg total) by mouth daily. 03/13/15   Midge Minium, MD  Multiple Vitamin (MULTIVITAMIN WITH MINERALS) TABS tablet Take 1 tablet by mouth daily.    Historical Provider, MD  Omega-3 Fatty Acids (FISH OIL) 1200 MG CAPS Take 1,200 mg by mouth 2 (two) times daily.    Historical Provider, MD  omeprazole (PRILOSEC) 20 MG capsule Take 1 capsule (20 mg total) by mouth daily. 02/20/14   Midge Minium, MD  rosuvastatin (CRESTOR) 20 MG tablet Take 1 tablet (20 mg total) by mouth daily. 03/27/15   Burnell Blanks, MD  sildenafil (VIAGRA) 100 MG tablet Take 1  tablet (100 mg total) by mouth daily as needed for erectile dysfunction. 02/03/14   Midge Minium, MD  traZODone (DESYREL) 50 MG tablet Take 0.5-1 tablets (25-50 mg total) by mouth at bedtime as needed for sleep. 03/13/15   Midge Minium, MD   BP 128/75 mmHg  Pulse 80  Temp(Src) 97.8 F (36.6 C) (Oral)  Resp 16  Ht 5\' 11"  (1.803 m)  Wt 199 lb (90.266 kg)  BMI 27.77 kg/m2  SpO2 97% Physical Exam  Constitutional: He appears well-developed and well-nourished. No distress.  HENT:  Head: Normocephalic.  Nose: Nose normal.  Mouth/Throat: Oropharynx is clear and moist.  Eyes: EOM and lids are normal. Pupils are equal, round, and reactive to light. Lids are everted and swept, no foreign bodies found. Right eye exhibits no chemosis, no discharge, no exudate and no hordeolum. Left eye exhibits no chemosis, no discharge, no exudate and no hordeolum. No foreign body present in the left eye. Right conjunctiva is not injected. Right conjunctiva has no hemorrhage. Left conjunctiva is injected. Left conjunctiva has no hemorrhage.    Left eye reveals medial and lateral episcleral hyperemia as noted on diagram.  No photophobia.  No lid tenderness, erythema, or swelling.    Neck: Neck supple.  Lymphadenopathy:    He has no cervical adenopathy.  Vitals reviewed.   ED Course  Procedures  none   MDM   1. Episcleritis of left eye    Begin diclofenac ophthalmic solution Try using refrigerated lubricating eye drops two or three times daily. Followup with ophthalmologist.    Kandra Nicolas, MD 04/12/15 [phone]

## 2015-04-22 ENCOUNTER — Other Ambulatory Visit: Payer: Self-pay | Admitting: Family Medicine

## 2015-04-22 NOTE — Telephone Encounter (Signed)
Med filled.  

## 2015-05-04 ENCOUNTER — Telehealth: Payer: Self-pay | Admitting: Family Medicine

## 2015-05-04 NOTE — Telephone Encounter (Signed)
Pre Visit letter sent  °

## 2015-05-13 ENCOUNTER — Encounter: Payer: Self-pay | Admitting: Medical

## 2015-05-13 ENCOUNTER — Ambulatory Visit (INDEPENDENT_AMBULATORY_CARE_PROVIDER_SITE_OTHER): Payer: 59 | Admitting: Medical

## 2015-05-13 VITALS — BP 140/78 | HR 82 | Temp 98.5°F | Ht 71.0 in | Wt 200.0 lb

## 2015-05-13 DIAGNOSIS — J0100 Acute maxillary sinusitis, unspecified: Secondary | ICD-10-CM

## 2015-05-13 DIAGNOSIS — H6983 Other specified disorders of Eustachian tube, bilateral: Secondary | ICD-10-CM | POA: Diagnosis not present

## 2015-05-13 MED ORDER — METHYLPREDNISOLONE ACETATE 40 MG/ML IJ SUSP
40.0000 mg | Freq: Once | INTRAMUSCULAR | Status: AC
  Administered 2015-05-13: 40 mg via INTRAMUSCULAR

## 2015-05-13 MED ORDER — AMOXICILLIN-POT CLAVULANATE 875-125 MG PO TABS: 1.0000 | ORAL_TABLET | Freq: Two times a day (BID) | ORAL | Status: DC

## 2015-05-13 NOTE — Progress Notes (Signed)
Subjective:    Patient ID: Dan Sanchez, male    DOB: 1956/05/05, 59 y.o.   MRN: [id number]  HPI  Both ears have pressure. Also some sinus pressure. More on his left side ear can't hear well. And his sinus on left side tender. No fever and no chills. Symptoms came on within last 2 days. Describes more severe symptom onset last 24 hours. Describes worst sensation is left side maxillary sinus pressure and ear pressure left side.  Pt is on flonase and zyrtec.   Pt states no sinus infection past year.   Pt was traveling out of townwhen this occurred.   Review of Systems  Constitutional: Negative for fever, chills and fatigue.  HENT: Positive for congestion, ear pain and sinus pressure. Negative for facial swelling, nosebleeds, postnasal drip and rhinorrhea.        Decreased hearing left side since sinus pressure and left ear pain.  Eyes: Negative for redness and visual disturbance.  Respiratory: Negative for cough, chest tightness, shortness of breath and wheezing.   Cardiovascular: Negative for chest pain and palpitations.  Gastrointestinal: Negative for abdominal pain.  Neurological: Negative for headaches.  Hematological: Negative for adenopathy. Does not bruise/bleed easily.  Psychiatric/Behavioral: Negative for behavioral problems and confusion.     Past Medical History  Diagnosis Date  . Hyperlipidemia   . Urinary incontinence   . Allergy   . CAD (coronary artery disease)     History   Social History  . Marital Status: Married    Spouse Name: N/A  . Number of Children: N/A  . 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 Smoking status: Never Smoker   . Smokeless tobacco: Not on file  . Alcohol Use: 2.0 oz/week    4 drink(s) per week  . Drug Use: No  . Sexual Activity: Not on file   Other Topics Concern  . Not on file   Social History Narrative    Past Surgical History  Procedure  Laterality Date  . Shoulder surgery      Right shoulder   . Left heart catheterization with coronary angiogram N/A 08/13/2014    Procedure: LEFT HEART CATHETERIZATION WITH CORONARY ANGIOGRAM;  Surgeon: Burnell Blanks, MD;  Location: [facility] CATH LAB;  Service: Cardiovascular;  Laterality: N/A;    Family History  Problem Relation Age of Onset  . Heart disease Father     MI age 20    No Known Allergies  Current Outpatient Prescriptions on File Prior to Visit  Medication Sig Dispense Refill  . Ascorbic Acid (VITAMIN C) 1000 MG tablet Take 1,000 mg by mouth daily.    Marland Kitchen aspirin EC 325 MG tablet Take 325 mg by mouth at bedtime.    . cetirizine (ZYRTEC) 10 MG tablet Take 10 mg by mouth daily.    . diclofenac (VOLTAREN) 0.1 % ophthalmic solution Apply one drop in left eye 2 to 3 times daily for approximately two weeks until improved 5 mL 0  . EPINEPHrine (EPIPEN 2-PAK) 0.3 mg/0.3 mL IJ SOAJ injection Inject 0.3 mLs (0.3 mg total) into the muscle once. 4 Device 1  . fluticasone (FLONASE) 50 MCG/ACT nasal spray Place 2 sprays into both nostrils daily as needed for allergies or rhinitis.    . meloxicam (MOBIC) 15 MG tablet Take 1 tablet (15 mg total) by mouth daily. 30 tablet 1  . Multiple Vitamin (MULTIVITAMIN WITH MINERALS) TABS  tablet Take 1 tablet by mouth daily.    . Omega-3 Fatty Acids (FISH OIL) 1200 MG CAPS Take 1,200 mg by mouth 2 (two) times daily.    Marland Kitchen omeprazole (PRILOSEC) 20 MG capsule TAKE 1 CAPSULE (20 MG TOTAL) BY MOUTH DAILY. 30 capsule 6  . rosuvastatin (CRESTOR) 20 MG tablet Take 1 tablet (20 mg total) by mouth daily. 90 tablet 3  . sildenafil (VIAGRA) 100 MG tablet Take 1 tablet (100 mg total) by mouth daily as needed for erectile dysfunction. 10 tablet 6  . traZODone (DESYREL) 50 MG tablet Take 0.5-1 tablets (25-50 mg total) by mouth at bedtime as needed for sleep. 30 tablet 3   No current facility-administered medications on file prior to visit.    BP 140/78 mmHg   Pulse 82  Temp(Src) 98.5 F (36.9 C) (Oral)  Ht 5\' 11"  (1.803 m)  Wt 200 lb (90.719 kg)  BMI 27.91 kg/m2  SpO2 97%       Objective:   Physical Exam  General  Mental Status - Alert. General Appearance - Well groomed. Not in acute distress.  Skin Rashes- No Rashes.   HEENT Head- Normal. Ear Auditory Canal - Left- Normal. Right - Normal.Tympanic Membrane- Left- Normal. Right- Normal. Eye Sclera/Conjunctiva- Left- Normal. Right- Normal. Nose & Sinuses Nasal Mucosa- Left-  Boggy and Congested. Right-  Boggy and  Congested. Lt side  Maxillary pressure  But no  frontal sinus pressure. Mouth & Throat Lips: Upper Lip- Normal: no dryness, cracking, pallor, cyanosis, or vesicular eruption. Lower Lip-Normal: no dryness, cracking, pallor, cyanosis or vesicular eruption. Buccal Mucosa- Bilateral- No Aphthous ulcers. Oropharynx- No Discharge or Erythema. Tonsils: Characteristics- Bilateral- No Erythema or Congestion. Size/Enlargement- Bilateral- No enlargement. Discharge- bilateral-None.  Neck Neck- Supple. No Masses.   Chest and Lung Exam Auscultation: Breath Sounds:-Clear even and unlabored.  Cardiovascular Auscultation:Rythm- Regular, rate and rhythm. Murmurs & Other Heart Sounds:Ausculatation of the heart reveal- No Murmurs.  Lymphatic Head & Neck General Head & Neck Lymphatics: Bilateral: Description- No Localized lymphadenopathy.      Assessment & Plan:

## 2015-05-13 NOTE — Progress Notes (Signed)
Pre visit review using our clinic review tool, if applicable. No additional management support is needed unless otherwise documented below in the visit note. 

## 2015-05-13 NOTE — Assessment & Plan Note (Signed)
Recent severe nasal congestion. Related to possible flare of your allergies with then the  subsequent sinus infection in left maxillary sinus region. Also some eustachian tube dysfunction.  Since already on zytec and flonase will give depomedrol 40mg  im  Rx augmentin antibiotic  Folllow up in 7-10 days or as needed

## 2015-05-13 NOTE — Patient Instructions (Addendum)
Recent severe nasal congestion. Related t possible flare of your allergies with subsequent sinus infection in left maxillary sinus region. Also some eustachian tube dysfunction.  Since already on zytec and flonase will give depomedrol 40mg  im to relieve his congestion(Given since pt appeared very bothered by pressure sensation. Therefor opted for more aggressive tx)  Rx augmentin antibiotic  Folllow up in 7-10 days or as needed

## 2015-05-22 ENCOUNTER — Encounter: Payer: 59 | Admitting: Family Medicine

## 2015-06-03 ENCOUNTER — Telehealth: Payer: Self-pay | Admitting: Family Medicine

## 2015-06-03 NOTE — Telephone Encounter (Signed)
Scheduled pt for 07/16/15 11:00am, ok per Robynn Pane, [place] to use hosp follow up for those two.       Previous Messages     ----- Message -----   From: Margot Ables   Sent: 06/03/2015  1:03 PM    To: Karena Addison, CMA  Subject: RE: Birdie Riddle schedule                2 of the patients from 8/18 are CPE's. Please advise when I can fit pts in. Next available is end of November.    ----- Message -----   From: Kris Hartmann, CMA   Sent: 05/29/2015  4:51 PM    To: Laren Everts, Margot Ables  Subject: Birdie Riddle schedule                  FYI all even months on the 3rd Thursday Dr. Birdie Riddle has peer review meetings and cannot start seeing pts until 9:15.   August 18th is the first of these and the schedule was not blocked, Can we please move these patients.   Jessica.

## 2015-07-15 ENCOUNTER — Telehealth: Payer: Self-pay | Admitting: Behavioral Health

## 2015-07-15 ENCOUNTER — Encounter: Payer: Self-pay | Admitting: Behavioral Health

## 2015-07-15 NOTE — Telephone Encounter (Signed)
Pre-Visit Call completed with patient and chart updated.   Pre-Visit Info documented in Specialty Comments under [employer].    

## 2015-07-16 ENCOUNTER — Encounter: Payer: Self-pay | Admitting: Family Medicine

## 2015-07-16 ENCOUNTER — Encounter: Payer: Self-pay | Admitting: General Practice

## 2015-07-16 ENCOUNTER — Encounter: Payer: 59 | Admitting: Family Medicine

## 2015-07-16 ENCOUNTER — Ambulatory Visit (INDEPENDENT_AMBULATORY_CARE_PROVIDER_SITE_OTHER): Payer: 59 | Admitting: Family Medicine

## 2015-07-16 VITALS — BP 130/78 | HR 70 | Temp 98.0°F | Resp 16 | Ht 71.0 in | Wt 200.1 lb

## 2015-07-16 DIAGNOSIS — Z0000 Encounter for general adult medical examination without abnormal findings: Secondary | ICD-10-CM | POA: Diagnosis not present

## 2015-07-16 LAB — CBC WITH DIFFERENTIAL/PLATELET
Basophils Absolute: 0 10*3/uL (ref 0.0–0.1)
Basophils Relative: 0.3 % (ref 0.0–3.0)
Eosinophils Absolute: 0.1 10*3/uL (ref 0.0–0.7)
Eosinophils Relative: 2 % (ref 0.0–5.0)
HCT: 42.5 % (ref 39.0–52.0)
HEMOGLOBIN: 14.2 g/dL (ref 13.0–17.0)
Lymphocytes Relative: 27.8 % (ref 12.0–46.0)
Lymphs Abs: 1.5 10*3/uL (ref 0.7–4.0)
MCHC: 33.5 g/dL (ref 30.0–36.0)
MCV: 88.9 fl (ref 78.0–100.0)
MONO ABS: 0.4 10*3/uL (ref 0.1–1.0)
MONOS PCT: 7.7 % (ref 3.0–12.0)
Neutro Abs: 3.3 10*3/uL (ref 1.4–7.7)
Neutrophils Relative %: 62.2 % (ref 43.0–77.0)
Platelets: 253 10*3/uL (ref 150.0–400.0)
RBC: 4.79 Mil/uL (ref 4.22–5.81)
RDW: 13.8 % (ref 11.5–15.5)
WBC: 5.4 10*3/uL (ref 4.0–10.5)

## 2015-07-16 LAB — HEPATIC FUNCTION PANEL
ALBUMIN: 4.4 g/dL (ref 3.5–5.2)
ALT: 30 U/L (ref 0–53)
AST: 19 U/L (ref 0–37)
Alkaline Phosphatase: 61 U/L (ref 39–117)
Bilirubin, Direct: 0.1 mg/dL (ref 0.0–0.3)
Total Bilirubin: 0.4 mg/dL (ref 0.2–1.2)
Total Protein: 6.8 g/dL (ref 6.0–8.3)

## 2015-07-16 LAB — LIPID PANEL
CHOL/HDL RATIO: 3
Cholesterol: 174 mg/dL (ref 0–200)
HDL: 61.4 mg/dL (ref >39.00)
LDL CALC: 83 mg/dL (ref 0–99)
NonHDL: 112.67
TRIGLYCERIDES: 148 mg/dL (ref 0.0–149.0)
VLDL: 29.6 mg/dL (ref 0.0–40.0)

## 2015-07-16 LAB — TSH: TSH: 2.38 u[IU]/mL (ref 0.35–4.50)

## 2015-07-16 LAB — BASIC METABOLIC PANEL
BUN: 23 mg/dL (ref 6–23)
CALCIUM: 9.4 mg/dL (ref 8.4–10.5)
CO2: 28 mEq/L (ref 19–32)
Chloride: 104 mEq/L (ref 96–112)
Creatinine, Ser: 1.01 mg/dL (ref 0.40–1.50)
GFR: 80.93 mL/min (ref >60.00)
Glucose, Bld: 89 mg/dL (ref 70–99)
Potassium: 4.3 mEq/L (ref 3.5–5.1)
Sodium: 139 mEq/L (ref 135–145)

## 2015-07-16 LAB — PSA: PSA: 1.09 ng/mL (ref 0.10–4.00)

## 2015-07-16 NOTE — Assessment & Plan Note (Signed)
[name] PE WNL.  UTD on colonoscopy.  Encouraged pt to work on [employer] and regular exercise.  Check labs.  Anticipatory guidance provided.

## 2015-07-16 NOTE — Progress Notes (Signed)
   Subjective:    Patient ID: Dan Sanchez, male    DOB: 03/28/1956, 59 y.o.   MRN: [id number]  HPI CPE- UTD on colonoscopy (due 2020).  No concerns today.   Review of Systems Patient reports no vision/hearing changes, anorexia, fever ,adenopathy, persistant/recurrent hoarseness, swallowing issues, chest pain, palpitations, edema, persistant/recurrent cough, hemoptysis, dyspnea (rest,exertional, paroxysmal nocturnal), gastrointestinal  bleeding (melena, rectal bleeding), abdominal pain, excessive heart burn, GU symptoms (dysuria, hematuria, voiding/incontinence issues) syncope, focal weakness, memory loss, numbness & tingling, skin/hair/nail changes, depression, anxiety, abnormal bruising/bleeding, musculoskeletal symptoms/signs.      Objective:   Physical Exam BP 130/78 mmHg  Pulse 70  Temp(Src) 98 F (36.7 C) (Oral)  Resp 16  Ht 5\' 11"  (1.803 m)  Wt 200 lb 2 oz (90.776 kg)  BMI 27.92 kg/m2  SpO2 98%  General Appearance:    Alert, cooperative, no distress, appears stated age  Head:    Normocephalic, without obvious abnormality, atraumatic  Eyes:    PERRL, conjunctiva/corneas clear, EOM's intact, fundi    benign, both eyes       Ears:    Normal TM's and external ear canals, both ears  Nose:   Nares normal, septum midline, mucosa normal, no drainage   or sinus tenderness  Throat:   Lips, mucosa, and tongue normal; teeth and gums normal  Neck:   Supple, symmetrical, trachea midline, no adenopathy;       thyroid:  No enlargement/tenderness/nodules  Back:     Symmetric, no curvature, ROM normal, no CVA tenderness  Lungs:     Clear to auscultation bilaterally, respirations unlabored  Chest wall:    No tenderness or deformity  Heart:    Regular rate and rhythm, S1 and S2 normal, no murmur, rub   or gallop  Abdomen:     Soft, non-tender, bowel sounds active all four quadrants,    no masses, no organomegaly  Genitalia:    Normal male without lesion, discharge or tenderness  Rectal:     Normal tone, normal prostate, no masses or tenderness  Extremities:   Extremities normal, atraumatic, no cyanosis or edema  Pulses:   2+ and symmetric all extremities  Skin:   Skin color, texture, turgor normal, no rashes or lesions  Lymph nodes:   Cervical, supraclavicular, and axillary nodes normal  Neurologic:   CNII-XII intact. Normal strength, sensation and reflexes      throughout          Assessment & Plan:

## 2015-07-16 NOTE — Progress Notes (Signed)
Pre visit review using our clinic review tool, if applicable. No additional management support is needed unless otherwise documented below in the visit note. 

## 2015-07-16 NOTE — Patient Instructions (Signed)
Follow up in 6 months to recheck your cholesterol We'll notify you of your lab results and make any changes Keep up the good work on healthy diet and regular exercise- you look great! Call with any questions or concerns [place]!!!!

## 2015-08-17 ENCOUNTER — Other Ambulatory Visit: Payer: Self-pay | Admitting: Family Medicine

## 2015-08-17 NOTE — Telephone Encounter (Signed)
Medication filled to pharmacy as requested.   

## 2015-08-27 ENCOUNTER — Encounter: Payer: Self-pay | Admitting: Family Medicine

## 2015-08-27 ENCOUNTER — Ambulatory Visit (INDEPENDENT_AMBULATORY_CARE_PROVIDER_SITE_OTHER): Payer: 59 | Admitting: Family Medicine

## 2015-08-27 VITALS — BP 122/82 | HR 77 | Temp 98.2°F | Resp 16 | Wt 201.4 lb

## 2015-08-27 DIAGNOSIS — L259 Unspecified contact dermatitis, unspecified cause: Secondary | ICD-10-CM | POA: Insufficient documentation

## 2015-08-27 DIAGNOSIS — B009 Herpesviral infection, unspecified: Secondary | ICD-10-CM | POA: Insufficient documentation

## 2015-08-27 MED ORDER — TRIAMCINOLONE ACETONIDE 0.1 % EX OINT: 1.0000 "application " | TOPICAL_OINTMENT | Freq: Two times a day (BID) | CUTANEOUS | Status: DC

## 2015-08-27 MED ORDER — PREDNISONE 10 MG PO TABS: ORAL_TABLET | ORAL | Status: DC

## 2015-08-27 MED ORDER — VALACYCLOVIR HCL 1 G PO TABS: ORAL_TABLET | ORAL | Status: DC

## 2015-08-27 NOTE — Progress Notes (Signed)
   Subjective:    Patient ID: Dan Sanchez, male    DOB: [birth year], 59 y.o.   MRN: [id number]  HPI Rash- R flank.  Appeared 2 weeks ago after trimming bushes.  Very itchy.  Spreading.  Using OTC hydrocortisone w/o relief.  Hx of shingles and this feels different.  No pain, no burning.  No drainage or signs of infection   Review of Systems For ROS see HPI     Objective:   Physical Exam  Constitutional: He is oriented to person, place, and time. He appears well-developed and well-nourished. No distress.  Neurological: He is alert and oriented to person, place, and time.  Skin: Skin is warm and dry. Rash (vesicular rash in linear formation along R flank, no evidence of infxn, no TTP) noted. No erythema.  Psychiatric: He has a normal mood and affect. His behavior is normal. Thought content normal.  Vitals reviewed.         Assessment & Plan:

## 2015-08-27 NOTE — Patient Instructions (Signed)
Follow up as needed Start the Prednisone as directed- take w/ food Apply the ointment twice daily for itching At first sign of cold sore, take 2 tabs of Valtrex and repeat in 12 hrs Call with any questions or concerns Hang in there!!

## 2015-08-27 NOTE — Progress Notes (Signed)
Pre visit review using our clinic review tool, if applicable. No additional management support is needed unless otherwise documented below in the visit note. 

## 2015-08-28 NOTE — Assessment & Plan Note (Signed)
New.  Rash appeared after trimming bushes.  No pain or burning that would raise concern for shingles.  Start prednisone taper and topical steroid ointment.  Reviewed supportive care and red flags that should prompt return.  Pt expressed understanding and is in agreement w/ plan.

## 2015-08-28 NOTE — Assessment & Plan Note (Signed)
New.  Pt has hx of cold sores and does not find relief w/ OTC Abreva.  Asking for 'the pill that speeds up healing'.  Will start Valtrex to use prn at sign of outbreak.  Pt expressed understanding and is in agreement w/ plan.

## 2015-10-02 ENCOUNTER — Ambulatory Visit (INDEPENDENT_AMBULATORY_CARE_PROVIDER_SITE_OTHER): Payer: 59 | Admitting: Behavioral Health

## 2015-10-02 DIAGNOSIS — Z23 Encounter for immunization: Secondary | ICD-10-CM

## 2015-10-02 NOTE — Progress Notes (Signed)
Pre visit review using our clinic review tool, if applicable. No additional management support is needed unless otherwise documented below in the visit note. 

## 2015-10-07 ENCOUNTER — Encounter: Payer: 59 | Admitting: Family Medicine

## 2015-11-19 ENCOUNTER — Other Ambulatory Visit: Payer: Self-pay | Admitting: Family Medicine

## 2015-11-19 NOTE — Telephone Encounter (Signed)
Medication filled to pharmacy as requested.   

## 2015-11-26 ENCOUNTER — Encounter: Payer: Self-pay | Admitting: Family Medicine

## 2015-11-26 ENCOUNTER — Ambulatory Visit (INDEPENDENT_AMBULATORY_CARE_PROVIDER_SITE_OTHER): Payer: 59 | Admitting: Family Medicine

## 2015-11-26 VITALS — BP 124/80 | HR 86 | Temp 98.5°F | Resp 18 | Ht 71.0 in | Wt 206.5 lb

## 2015-11-26 DIAGNOSIS — J0101 Acute recurrent maxillary sinusitis: Secondary | ICD-10-CM | POA: Diagnosis not present

## 2015-11-26 MED ORDER — BENZONATATE 200 MG PO CAPS: 200.0000 mg | ORAL_CAPSULE | Freq: Three times a day (TID) | ORAL | Status: DC | PRN

## 2015-11-26 MED ORDER — AMOXICILLIN 875 MG PO TABS: 875.0000 mg | ORAL_TABLET | Freq: Two times a day (BID) | ORAL | Status: DC

## 2015-11-26 NOTE — Patient Instructions (Signed)
Follow up as needed Start the Amoxicillin twice daily- take w/ food Drink plenty of fluids Use the Tessalon as needed for daytime cough (can combine w/ Mucinex DM) REST! Call with any questions or concerns Happy New Year!!!!

## 2015-11-26 NOTE — Progress Notes (Signed)
Pre visit review using our clinic review tool, if applicable. No additional management support is needed unless otherwise documented below in the visit note. 

## 2015-11-26 NOTE — Assessment & Plan Note (Signed)
[name] sxs and PE consistent w/ infxn.  Start abx.  Cough meds prn.  Reviewed supportive care and red flags that should prompt return.  Pt expressed understanding and is in agreement w/ plan.  

## 2015-11-26 NOTE — Progress Notes (Signed)
   Subjective:    Patient ID: Dan Sanchez, male    DOB: [birth year], 59 y.o.   MRN: [id number]  HPI URI- sxs started 2.5 weeks ago after working outside.  Developed nasal congestion.  Now sinus pressure, worse when leaning forward.  No tooth pain.  No fevers.  No ear pain.  + cough- intermittently productive.  No N/V.  + sick contacts.   Review of Systems For ROS see HPI     Objective:   Physical Exam  Constitutional: He appears well-developed and well-nourished. No distress.  HENT:  Head: Normocephalic and atraumatic.  Right Ear: Tympanic membrane normal.  Left Ear: Tympanic membrane normal.  Nose: Mucosal edema and rhinorrhea present. Right sinus exhibits maxillary sinus tenderness and frontal sinus tenderness. Left sinus exhibits maxillary sinus tenderness and frontal sinus tenderness.  Mouth/Throat: Mucous membranes are normal. Oropharyngeal exudate and posterior oropharyngeal erythema present. No posterior oropharyngeal edema.  + PND  Eyes: Conjunctivae and EOM are normal. Pupils are equal, round, and reactive to light.  Neck: Normal range of motion. Neck supple.  Cardiovascular: Normal rate, regular rhythm and normal heart sounds.   Pulmonary/Chest: Effort normal and breath sounds normal. No respiratory distress. He has no wheezes.  + hacking cough  Lymphadenopathy:    He has no cervical adenopathy.  Skin: Skin is warm and dry.  Vitals reviewed.         Assessment & Plan:

## 2015-12-14 MED FILL — FLUTICASONE PROP 50 MCG SPR: 50 | 30 days supply | Qty: 16 | Fill #3

## 2016-01-11 MED FILL — ROSUVASTATIN CALCIUM 20 MG: 20 | 90 days supply | Qty: 90 | Fill #1

## 2016-01-28 MED FILL — FLUTICASONE PROP 50 MCG SPR: 50 | 30 days supply | Qty: 16 | Fill #4

## 2016-02-26 MED FILL — FLUTICASONE PROP 50 MCG SPR: 50 | 30 days supply | Qty: 16 | Fill #5

## 2016-02-26 MED FILL — OMEPRAZOLE DR 20 MG CAPSULE: 20 | 90 days supply | Qty: 90 | Fill #1

## 2016-03-03 ENCOUNTER — Ambulatory Visit: Payer: 59 | Admitting: Family Medicine

## 2016-03-18 DIAGNOSIS — L821 Other seborrheic keratosis: Secondary | ICD-10-CM | POA: Diagnosis not present

## 2016-03-18 DIAGNOSIS — L814 Other melanin hyperpigmentation: Secondary | ICD-10-CM | POA: Diagnosis not present

## 2016-03-18 DIAGNOSIS — D225 Melanocytic nevi of trunk: Secondary | ICD-10-CM | POA: Diagnosis not present

## 2016-03-18 DIAGNOSIS — L570 Actinic keratosis: Secondary | ICD-10-CM | POA: Diagnosis not present

## 2016-03-18 MED FILL — PICATO 0.015% GEL: 0.015 | 20 days supply | Qty: 3 | Fill #0

## 2016-04-04 ENCOUNTER — Other Ambulatory Visit: Payer: Self-pay | Admitting: Cardiovascular Disease

## 2016-04-04 MED FILL — FLUTICASONE PROP 50 MCG SPR: 50 | 30 days supply | Qty: 16 | Fill #6

## 2016-04-11 MED FILL — ROSUVASTATIN CALCIUM 20 MG: 20 | 90 days supply | Qty: 90 | Fill #0

## 2016-05-18 NOTE — Progress Notes (Signed)
Chief Complaint  Patient presents with  . Follow-up    History of Present Illness: 60 yo male with history of CAD, HLD here today for cardiac follow up. I met him in the cath lab in September 2015. He had an abnormal outpatient stress test arranged by primary care. During the GXT he had ST elevation in AVR, AVL. No chest pain. Cardiac cath 08/13/14 with mild disease in the LAD (20%) and RCA (20%). LVEF normal.   He is here today for follow up. No chest pain or SOB. Very active. No complaints today.   Primary Care Physician: Annye Asa, MD  Past Medical History  Diagnosis Date  . Hyperlipidemia   . Urinary incontinence   . Allergy   . CAD (coronary artery disease)     Past Surgical History  Procedure Laterality Date  . Shoulder surgery      Right shoulder   . Left heart catheterization with coronary angiogram N/A 08/13/2014    Procedure: LEFT HEART CATHETERIZATION WITH CORONARY ANGIOGRAM;  Surgeon: Burnell Blanks, MD;  Location: [facility] CATH LAB;  Service: Cardiovascular;  Laterality: N/A;    Current Outpatient Prescriptions  Medication Sig Dispense Refill  . Ascorbic Acid (VITAMIN C) 1000 MG tablet Take 1,000 mg by mouth daily.    Marland Kitchen aspirin EC 325 MG tablet Take 325 mg by mouth at bedtime.    . cetirizine (ZYRTEC) 10 MG tablet Take 10 mg by mouth daily.    Marland Kitchen EPINEPHrine (EPIPEN 2-PAK) 0.3 mg/0.3 mL IJ SOAJ injection Inject 0.3 mLs (0.3 mg total) into the muscle once. 4 Device 1  . fluticasone (FLONASE) 50 MCG/ACT nasal spray PLACE 2 SPRAYS INTO BOTH NOSTRILS DAILY. 16 g 6  . Multiple Vitamin (MULTIVITAMIN WITH MINERALS) TABS tablet Take 1 tablet by mouth daily.    . Omega-3 Fatty Acids (FISH OIL) 1200 MG CAPS Take 1,200 mg by mouth 2 (two) times daily.    Marland Kitchen omeprazole (PRILOSEC) 20 MG capsule TAKE 1 CAPSULE (20 MG TOTAL) BY MOUTH DAILY. 30 capsule 6  . rosuvastatin (CRESTOR) 20 MG tablet TAKE 1 TABLET (20 MG TOTAL) BY MOUTH DAILY. 90 tablet 0  . sildenafil (VIAGRA)  100 MG tablet Take 1 tablet (100 mg total) by mouth daily as needed for erectile dysfunction. 10 tablet 6  . valACYclovir (VALTREX) 1000 MG tablet Take 1,000 mg by mouth 2 (two) times daily. At the sight of a cold sore     No current facility-administered medications for this visit.    No Known Allergies  Social History   Social History  . Marital Status: Married    Spouse Name: N/A  . Number of Children: N/A  . 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 Smoking status: Never Smoker   . Smokeless tobacco: Not on file  . Alcohol Use: 2.0 oz/week    4 drink(s) per week  . Drug Use: No  . Sexual Activity: Not on file   Other Topics Concern  . Not on file   Social History Narrative    Family History  Problem Relation Age of Onset  . Heart disease Father     MI age 69    Review of Systems:  As stated in the HPI and otherwise negative.   BP 142/90 mmHg  Pulse 74  Ht '5\' 11"'$  (1.803 m)  Wt 201 lb 9.6 oz (91.445 kg)  BMI 28.13 kg/m2  SpO2 98%  Physical Examination: General: Well developed, well nourished, NAD HEENT: OP clear, mucus membranes moist SKIN: warm, dry. No rashes. Neuro: No focal deficits Musculoskeletal: Muscle strength 5/5 all ext Psychiatric: Mood and affect normal Neck: No JVD, no carotid bruits, no thyromegaly, no lymphadenopathy. Lungs:Clear bilaterally, no wheezes, rhonci, crackles Cardiovascular: Regular rate and rhythm. No murmurs, gallops or rubs. Abdomen:Soft. Bowel sounds present. Non-tender.  Extremities: No lower extremity edema. Pulses are 2 + in the bilateral DP/PT.  Cardiac cath 08/13/14: Hemodynamic Findings:  Central aortic pressure: 137/82  Left ventricular pressure: 144/8/14  Angiographic Findings:  Left main: No obstructive disease.  Left Anterior Descending Artery: Large caliber vessel that courses to the apex. There is a 20% stenosis in the mid vessel. There are  mild luminal irregularities in the distal vessel. There are several small caliber diagonal branches.  Circumflex Artery: Large caliber vessel with termination into an obtuse marginal branch. No obstructive disease.  Right Coronary Artery: Large dominant vessel with 20% proximal stenosis.  Left Ventricular Angiogram: LVEF=65-70%.   EKG:  EKG is ordered today. The ekg ordered today demonstrates NSR, rate 67 bpm.   Recent Labs: 07/16/2015: ALT 30; BUN 23; Creatinine, Ser 1.01; Hemoglobin 14.2; Platelets 253.0; Potassium 4.3; Sodium 139; TSH 2.38   Lipid Panel    Component Value Date/Time   CHOL 174 07/16/2015 1158   TRIG 148.0 07/16/2015 1158   TRIG 81 10/04/2006 1227   HDL 61.40 07/16/2015 1158   CHOLHDL 3 07/16/2015 1158   CHOLHDL 2.5 CALC 10/04/2006 1227   VLDL 29.6 07/16/2015 1158   LDLCALC 83 07/16/2015 1158   LDLDIRECT 120.9 03/16/2012 1054     Wt Readings from Last 3 Encounters:  05/20/16 201 lb 9.6 oz (91.445 kg)  11/26/15 206 lb 8 oz (93.668 kg)  08/27/15 201 lb 6 oz (91.343 kg)     Other studies Reviewed: Additional studies/ records that were reviewed today include: . Review of the above records demonstrates:    Assessment and Plan:   1. CAD: Cath September 2015 with mild disease in the LAD and RCA. Will continue statin. Lower ASA to 81 mg.    2. HLD: Continue Crestor as above. Will check lipids and LFTs now.   Current medicines are reviewed at length with the patient today.  The patient does not have concerns regarding medicines.  The following changes have been made:  no change  Labs/ tests ordered today include:   Orders Placed This Encounter  Procedures  . Lipid Profile  . Hepatic function panel  . EKG 12-Lead    Disposition:   FU with me in 12  months  Signed, Lauree Chandler, MD 05/20/2016 9:36 AM    [facility] [address], [place], [place]  [address] Phone: [phone]; Fax: [phone]

## 2016-05-20 ENCOUNTER — Encounter: Payer: Self-pay | Admitting: Cardiovascular Disease

## 2016-05-20 ENCOUNTER — Ambulatory Visit (INDEPENDENT_AMBULATORY_CARE_PROVIDER_SITE_OTHER): Payer: 59 | Admitting: Cardiovascular Disease

## 2016-05-20 VITALS — BP 142/90 | HR 74 | Ht 71.0 in | Wt 201.6 lb

## 2016-05-20 DIAGNOSIS — I2510 Atherosclerotic heart disease of native coronary artery without angina pectoris: Secondary | ICD-10-CM

## 2016-05-20 DIAGNOSIS — E785 Hyperlipidemia, unspecified: Secondary | ICD-10-CM | POA: Diagnosis not present

## 2016-05-20 LAB — LIPID PANEL
CHOL/HDL RATIO: 2.4 ratio (ref <=5.0)
Cholesterol: 169 mg/dL (ref 125–200)
HDL: 71 mg/dL (ref >=40)
LDL Cholesterol: 73 mg/dL (ref <130)
Triglycerides: 126 mg/dL (ref <150)
VLDL: 25 mg/dL (ref <30)

## 2016-05-20 LAB — HEPATIC FUNCTION PANEL
ALK PHOS: 64 U/L (ref 40–115)
ALT: 36 U/L (ref 9–46)
AST: 23 U/L (ref 10–35)
Albumin: 4.2 g/dL (ref 3.6–5.1)
BILIRUBIN INDIRECT: 0.3 mg/dL (ref 0.2–1.2)
BILIRUBIN TOTAL: 0.4 mg/dL (ref 0.2–1.2)
Bilirubin, Direct: 0.1 mg/dL (ref <=0.2)
Total Protein: 6.6 g/dL (ref 6.1–8.1)

## 2016-05-20 NOTE — Patient Instructions (Signed)
Medication Instructions:  Your physician recommends that you continue on your current medications as directed. Please refer to the Current Medication list given to you today.   Labwork: Lab work to be done today--lipid and liver profiles.   Testing/Procedures: none  Follow-Up: Your physician wants you to follow-up in: 12 months.  You will receive a reminder letter in the mail two months in advance. If you don't receive a letter, please call our office to schedule the follow-up appointment.   Any Other Special Instructions Will Be Listed Below (If Applicable).     If you need a refill on your cardiac medications before your next appointment, please call your pharmacy.

## 2016-05-26 ENCOUNTER — Other Ambulatory Visit: Payer: Self-pay | Admitting: Family Medicine

## 2016-05-26 MED FILL — FLUTICASONE PROP 50 MCG SPR: 50 | 30 days supply | Qty: 16 | Fill #0

## 2016-05-26 MED FILL — OMEPRAZOLE DR 20 MG CAPSULE: 20 | 30 days supply | Qty: 30 | Fill #2

## 2016-05-26 NOTE — Telephone Encounter (Signed)
Medication filled to pharmacy as requested.   

## 2016-07-01 ENCOUNTER — Other Ambulatory Visit: Payer: Self-pay | Admitting: Family Medicine

## 2016-07-01 ENCOUNTER — Other Ambulatory Visit: Payer: Self-pay | Admitting: Cardiovascular Disease

## 2016-07-01 MED FILL — ROSUVASTATIN CALCIUM 20 MG: 20 | 90 days supply | Qty: 90 | Fill #0

## 2016-07-04 MED FILL — OMEPRAZOLE DR 20 MG CAPSULE: 20 | 30 days supply | Qty: 30 | Fill #0

## 2016-07-26 MED FILL — FLUTICASONE PROP 50 MCG SPR: 50 | 30 days supply | Qty: 16 | Fill #1

## 2016-08-09 MED FILL — OMEPRAZOLE DR 20 MG CAPSULE: 20 | 30 days supply | Qty: 30 | Fill #1

## 2016-08-23 ENCOUNTER — Ambulatory Visit (INDEPENDENT_AMBULATORY_CARE_PROVIDER_SITE_OTHER): Payer: 59 | Admitting: Family Medicine

## 2016-08-23 ENCOUNTER — Encounter: Payer: Self-pay | Admitting: Family Medicine

## 2016-08-23 VITALS — BP 130/80 | HR 72 | Temp 98.2°F | Resp 17 | Ht 71.0 in | Wt 204.5 lb

## 2016-08-23 DIAGNOSIS — J0101 Acute recurrent maxillary sinusitis: Secondary | ICD-10-CM

## 2016-08-23 MED ORDER — OMEPRAZOLE 20 MG PO CPDR: DELAYED_RELEASE_CAPSULE | ORAL | 3 refills | Status: DC

## 2016-08-23 MED ORDER — AMOXICILLIN 875 MG PO TABS: 875.0000 mg | ORAL_TABLET | Freq: Two times a day (BID) | ORAL | 0 refills | Status: DC

## 2016-08-23 MED FILL — AMOXICILLIN 875 MG TABLET: 875 | 10 days supply | Qty: 20 | Fill #0

## 2016-08-23 NOTE — Progress Notes (Signed)
Pre visit review using our clinic review tool, if applicable. No additional management support is needed unless otherwise documented below in the visit note. 

## 2016-08-23 NOTE — Assessment & Plan Note (Signed)
[name] sxs and PE consistent w/ infxn.  Start amox as this has worked well for pt in past.  Reviewed supportive care and red flags that should prompt return.  Pt expressed understanding and is in agreement w/ plan.

## 2016-08-23 NOTE — Progress Notes (Signed)
   Subjective:    Patient ID: Dan Sanchez, male    DOB: 1955/12/14, 60 y.o.   MRN: [id number]  HPI URI- sxs started ~1 month ago.  + sinus pressure, congestion.  + tooth pain.  No fevers.  Intermittent ear fullness/pain.  No N/V.  Minimal cough.  Currently on Allegra, using Flonase.   Review of Systems For ROS see HPI     Objective:   Physical Exam  Constitutional: He appears well-developed and well-nourished. No distress.  HENT:  Head: Normocephalic and atraumatic.  Right Ear: Tympanic membrane normal.  Left Ear: Tympanic membrane normal.  Nose: Mucosal edema and rhinorrhea present. Right sinus exhibits maxillary sinus tenderness and frontal sinus tenderness. Left sinus exhibits maxillary sinus tenderness and frontal sinus tenderness.  Mouth/Throat: Mucous membranes are normal. Oropharyngeal exudate and posterior oropharyngeal erythema present. No posterior oropharyngeal edema.  + PND  Eyes: Conjunctivae and EOM are normal. Pupils are equal, round, and reactive to light.  Neck: Normal range of motion. Neck supple.  Cardiovascular: Normal rate, regular rhythm and normal heart sounds.   Pulmonary/Chest: Effort normal and breath sounds normal. No respiratory distress. He has no wheezes.  Lymphadenopathy:    He has no cervical adenopathy.  Skin: Skin is warm and dry.  Vitals reviewed.         Assessment & Plan:

## 2016-08-23 NOTE — Patient Instructions (Signed)
Follow up as scheduled Start the Amoxicillin twice daily- take w/ food Drink plenty of fluids Continue your allergy meds and the nasal spray Call with any questions or concerns Hang in there!!!

## 2016-09-01 MED FILL — FLUTICASONE PROP 50 MCG SPR: 50 | 30 days supply | Qty: 16 | Fill #2

## 2016-09-05 MED FILL — OMEPRAZOLE DR 20 MG CAPSULE: 20 | 90 days supply | Qty: 90 | Fill #0

## 2016-09-15 DIAGNOSIS — L918 Other hypertrophic disorders of the skin: Secondary | ICD-10-CM | POA: Diagnosis not present

## 2016-09-15 DIAGNOSIS — L570 Actinic keratosis: Secondary | ICD-10-CM | POA: Diagnosis not present

## 2016-09-15 DIAGNOSIS — L821 Other seborrheic keratosis: Secondary | ICD-10-CM | POA: Diagnosis not present

## 2016-09-16 ENCOUNTER — Ambulatory Visit (INDEPENDENT_AMBULATORY_CARE_PROVIDER_SITE_OTHER): Payer: 59 | Admitting: Family Medicine

## 2016-09-16 ENCOUNTER — Encounter: Payer: Self-pay | Admitting: Family Medicine

## 2016-09-16 VITALS — BP 138/86 | HR 86 | Temp 98.3°F | Resp 16 | Ht 71.0 in | Wt 202.1 lb

## 2016-09-16 DIAGNOSIS — Z23 Encounter for immunization: Secondary | ICD-10-CM | POA: Diagnosis not present

## 2016-09-16 DIAGNOSIS — M5442 Lumbago with sciatica, left side: Secondary | ICD-10-CM | POA: Diagnosis not present

## 2016-09-16 DIAGNOSIS — Z0000 Encounter for general adult medical examination without abnormal findings: Secondary | ICD-10-CM

## 2016-09-16 DIAGNOSIS — G8929 Other chronic pain: Secondary | ICD-10-CM

## 2016-09-16 LAB — CBC WITH DIFFERENTIAL/PLATELET
Basophils Absolute: 0 K/uL (ref 0.0–0.1)
Basophils Relative: 0.7 % (ref 0.0–3.0)
Eosinophils Absolute: 0.2 K/uL (ref 0.0–0.7)
Eosinophils Relative: 3.5 % (ref 0.0–5.0)
HCT: 44.1 % (ref 39.0–52.0)
Hemoglobin: 14.8 g/dL (ref 13.0–17.0)
Lymphocytes Relative: 32.2 % (ref 12.0–46.0)
Lymphs Abs: 1.6 K/uL (ref 0.7–4.0)
MCHC: 33.5 g/dL (ref 30.0–36.0)
MCV: 87.7 fl (ref 78.0–100.0)
Monocytes Absolute: 0.4 K/uL (ref 0.1–1.0)
Monocytes Relative: 7.7 % (ref 3.0–12.0)
Neutro Abs: 2.7 K/uL (ref 1.4–7.7)
Neutrophils Relative %: 55.9 % (ref 43.0–77.0)
Platelets: 258 K/uL (ref 150.0–400.0)
RBC: 5.03 Mil/uL (ref 4.22–5.81)
RDW: 13.5 % (ref 11.5–15.5)
WBC: 4.9 K/uL (ref 4.0–10.5)

## 2016-09-16 LAB — HEPATIC FUNCTION PANEL
ALT: 38 U/L (ref 0–53)
AST: 23 U/L (ref 0–37)
Albumin: 4.5 g/dL (ref 3.5–5.2)
Alkaline Phosphatase: 63 U/L (ref 39–117)
Bilirubin, Direct: 0.1 mg/dL (ref 0.0–0.3)
Total Bilirubin: 0.6 mg/dL (ref 0.2–1.2)
Total Protein: 7 g/dL (ref 6.0–8.3)

## 2016-09-16 LAB — BASIC METABOLIC PANEL WITH GFR
BUN: 25 mg/dL — ABNORMAL HIGH (ref 6–23)
CO2: 31 meq/L (ref 19–32)
Calcium: 9.5 mg/dL (ref 8.4–10.5)
Chloride: 103 meq/L (ref 96–112)
Creatinine, Ser: 1.07 mg/dL (ref 0.40–1.50)
GFR: 75.4 mL/min
Glucose, Bld: 91 mg/dL (ref 70–99)
Potassium: 4.2 meq/L (ref 3.5–5.1)
Sodium: 141 meq/L (ref 135–145)

## 2016-09-16 LAB — PSA: PSA: 1.44 ng/mL (ref 0.10–4.00)

## 2016-09-16 LAB — LIPID PANEL
CHOLESTEROL: 186 mg/dL (ref 0–200)
HDL: 66.6 mg/dL (ref >39.00)
LDL CALC: 94 mg/dL (ref 0–99)
NonHDL: 119.21
TRIGLYCERIDES: 125 mg/dL (ref 0.0–149.0)
Total CHOL/HDL Ratio: 3
VLDL: 25 mg/dL (ref 0.0–40.0)

## 2016-09-16 LAB — TSH: TSH: 1.73 u[IU]/mL (ref 0.35–4.50)

## 2016-09-16 NOTE — Progress Notes (Signed)
   Subjective:    Patient ID: Dan Sanchez, male    DOB: [birth year], 58 y.o.   MRN: [id number]  HPI CPE- UTD on Tdap, due for flu today.  UTD on colonoscopy (due 2020)   Review of Systems Patient reports no vision/hearing changes, anorexia, fever ,adenopathy, persistant/recurrent hoarseness, swallowing issues, chest pain, palpitations, edema, persistant/recurrent cough, hemoptysis, dyspnea (rest,exertional, paroxysmal nocturnal), gastrointestinal  bleeding (melena, rectal bleeding), abdominal pain, excessive heart burn, GU symptoms (dysuria, hematuria, voiding/incontinence issues) syncope, focal weakness, memory loss, numbness & tingling, skin/hair/nail changes, depression, anxiety, abnormal bruising/bleeding  + LBP- sxs intermittently x4-5 months, able to play tennis.  Will tighten w/ prolonged sitting.  No difficulty sleeping at night.     Objective:   Physical Exam BP 138/86   Pulse 86   Temp 98.3 F (36.8 C) (Oral)   Resp 16   Ht 5\' 11"  (1.803 m)   Wt 202 lb 2 oz (91.7 kg)   SpO2 98%   BMI 28.19 kg/m   General Appearance:    Alert, cooperative, no distress, appears stated age  Head:    Normocephalic, without obvious abnormality, atraumatic  Eyes:    PERRL, conjunctiva/corneas clear, EOM's intact, fundi    benign, both eyes       Ears:    Normal TM's and external ear canals, both ears  Nose:   Nares normal, septum midline, mucosa normal, no drainage   or sinus tenderness  Throat:   Lips, mucosa, and tongue normal; teeth and gums normal  Neck:   Supple, symmetrical, trachea midline, no adenopathy;       thyroid:  No enlargement/tenderness/nodules  Back:     Symmetric, no curvature, ROM normal, no CVA tenderness  Lungs:     Clear to auscultation bilaterally, respirations unlabored  Chest wall:    No tenderness or deformity  Heart:    Regular rate and rhythm, S1 and S2 normal, no murmur, rub   or gallop  Abdomen:     Soft, non-tender, bowel sounds active all four quadrants,      no masses, no organomegaly  Genitalia:    Normal male without lesion, discharge or tenderness  Rectal:    Normal tone, normal prostate, no masses or tenderness  Extremities:   Extremities normal, atraumatic, no cyanosis or edema  Pulses:   2+ and symmetric all extremities  Skin:   Skin color, texture, turgor normal, no rashes or lesions  Lymph nodes:   Cervical, supraclavicular, and axillary nodes normal  Neurologic:   CNII-XII intact. Normal strength, sensation and reflexes      throughout          Assessment & Plan:

## 2016-09-16 NOTE — Progress Notes (Signed)
Pre visit review using our clinic review tool, if applicable. No additional management support is needed unless otherwise documented below in the visit note. 

## 2016-09-16 NOTE — Assessment & Plan Note (Signed)
[name] PE WNL.  UTD on colonoscopy, Tdap.  Due for flu- given today.  Check labs. Anticipatory guidance provided.

## 2016-09-16 NOTE — Patient Instructions (Addendum)
Follow up in 6 months to recheck cholesterol We'll notify you of your lab results and make any changes if needed Continue to work on healthy diet and regular exercise- you look good! We'll call you with your Sports Medicine Appt for the low back pain Call with any questions or concerns Happy Anniversary!!!

## 2016-09-19 ENCOUNTER — Encounter: Payer: Self-pay | Admitting: General Practice

## 2016-09-26 ENCOUNTER — Encounter: Payer: Self-pay | Admitting: Family Medicine

## 2016-09-26 ENCOUNTER — Ambulatory Visit (INDEPENDENT_AMBULATORY_CARE_PROVIDER_SITE_OTHER): Payer: 59 | Admitting: Family Medicine

## 2016-09-26 ENCOUNTER — Ambulatory Visit (HOSPITAL_BASED_OUTPATIENT_CLINIC_OR_DEPARTMENT_OTHER)
Admission: RE | Admit: 2016-09-26 | Discharge: 2016-09-26 | Disposition: A | Discharge status: Home / Self Care | Payer: 59 | Source: Ambulatory Visit | Attending: Family Medicine | Admitting: Family Medicine

## 2016-09-26 VITALS — BP 154/91 | HR 73 | Ht 71.0 in | Wt 198.0 lb

## 2016-09-26 DIAGNOSIS — M545 Low back pain, unspecified: Secondary | ICD-10-CM

## 2016-09-26 DIAGNOSIS — M5441 Lumbago with sciatica, right side: Secondary | ICD-10-CM

## 2016-09-26 DIAGNOSIS — M79605 Pain in left leg: Secondary | ICD-10-CM

## 2016-09-26 DIAGNOSIS — M47816 Spondylosis without myelopathy or radiculopathy, lumbar region: Secondary | ICD-10-CM | POA: Insufficient documentation

## 2016-09-26 DIAGNOSIS — M5442 Lumbago with sciatica, left side: Secondary | ICD-10-CM

## 2016-09-26 DIAGNOSIS — G8929 Other chronic pain: Secondary | ICD-10-CM | POA: Diagnosis not present

## 2016-09-26 MED ORDER — PREDNISONE 10 MG PO TABS: ORAL_TABLET | ORAL | 0 refills | Status: DC

## 2016-09-26 MED FILL — predniSONE 10 MG TABS: 10 | 6 days supply | Qty: 21 | Fill #0

## 2016-09-26 MED FILL — FLUTICASONE PROP 50 MCG SPR: 50 | 30 days supply | Qty: 16 | Fill #3

## 2016-09-26 NOTE — Patient Instructions (Signed)
Your back pain is due to arthritis. These are the different classes of medicine you can take for this: Prednisone dose pack - take as directed. Do not take aleve or ibuprofen while taking the prednisone. Tylenol 500mg  1-2 tabs three times a day for pain. Glucosamine sulfate 750mg  twice a day is a supplement that may help. Capsaicin, aspercreme, or biofreeze topically up to four times a day may also help with pain. It's important that you continue to stay active. Consider physical therapy - you have done this already. Heat or ice 15 minutes at a time 3-4 times a day as needed to help with pain. Call me in a week to let me know how you're doing. If not improving will go ahead with MRI.

## 2016-09-26 NOTE — Assessment & Plan Note (Signed)
independently reviewed radiographs.  His history, exam, radiographs all consistent with arthritis.  He has tried physical therapy and modalities already without much benefit.  He will try prednisone dose pack, continue home exercises.  Discussed tylenol, topical medications, glucosamine as well.  Heat or ice.  Call us in a week for an update on his status.  If not improving consider MRI of lumbar spine.

## 2016-09-26 NOTE — Progress Notes (Addendum)
PCP and consultation requested by: Annye Asa, MD  Subjective:   HPI: Patient is a 60 y.o. male here for low back pain.  Patient reports he has had about 5 months of low back pain. Feels this bilaterally. Pain is 5/10, soreness, and a heat sensation locally. Feels stiff after playing tennis and golf. No acute injury or trauma. Can go down back of his legs. Tried physical therapy (wife is a PT-A), TENS unit, ionto, stretches, massage therapy, aleve. No numbness or tingling. No skin changes. No bowel/bladder dysfunction.  Past Medical History:  Diagnosis Date  . Allergy   . CAD (coronary artery disease)   . Hyperlipidemia   . Urinary incontinence     Current Outpatient Prescriptions on File Prior to Visit  Medication Sig Dispense Refill  . Ascorbic Acid (VITAMIN C) 1000 MG tablet Take 1,000 mg by mouth daily.    Marland Kitchen aspirin EC 325 MG tablet Take 325 mg by mouth at bedtime.    . cetirizine (ZYRTEC) 10 MG tablet Take 10 mg by mouth daily.    Marland Kitchen EPINEPHrine (EPIPEN 2-PAK) 0.3 mg/0.3 mL IJ SOAJ injection Inject 0.3 mLs (0.3 mg total) into the muscle once. 4 Device 1  . fluticasone (FLONASE) 50 MCG/ACT nasal spray PLACE 2 SPRAYS INTO BOTH NOSTRILS DAILY. 16 g 6  . Multiple Vitamin (MULTIVITAMIN WITH MINERALS) TABS tablet Take 1 tablet by mouth daily.    . Omega-3 Fatty Acids (FISH OIL) 1200 MG CAPS Take 1,200 mg by mouth 2 (two) times daily.    Marland Kitchen omeprazole (PRILOSEC) 20 MG capsule TAKE 1 CAPSULE (20 MG TOTAL) BY MOUTH DAILY. 90 capsule 3  . rosuvastatin (CRESTOR) 20 MG tablet TAKE 1 TABLET (20 MG TOTAL) BY MOUTH DAILY. 90 tablet 0  . sildenafil (VIAGRA) 100 MG tablet Take 1 tablet (100 mg total) by mouth daily as needed for erectile dysfunction. 10 tablet 6  . valACYclovir (VALTREX) 1000 MG tablet Take 1,000 mg by mouth 2 (two) times daily. At the sight of a cold sore     No current facility-administered medications on file prior to visit.     Past Surgical History:   Procedure Laterality Date  . LEFT HEART CATHETERIZATION WITH CORONARY ANGIOGRAM N/A 08/13/2014   Procedure: LEFT HEART CATHETERIZATION WITH CORONARY ANGIOGRAM;  Surgeon: Burnell Blanks, MD;  Location: [facility] CATH LAB;  Service: Cardiovascular;  Laterality: N/A;  . SHOULDER SURGERY     Right shoulder     No Known Allergies  Social History   Social History  . Marital status: Married    Spouse name: N/A  . Number of children: N/A  . Years of education: N/A   Occupational History  . [employer]   Social History Main Topics  . Smoking status: Never Smoker  . Smokeless tobacco: Never Used  . Alcohol use 2.0 oz/week    4 Standard drinks or equivalent per week  . Drug use: No  . Sexual activity: Not on file   Other Topics Concern  . Not on file   Social History Narrative  . No narrative on file    Family History  Problem Relation Age of Onset  . Heart disease Father     MI age 41    BP (!) 154/91   Pulse 73   Ht 5\' 11"  (1.803 m)   Wt 198 lb (89.8 kg)   BMI 27.62 kg/m   Review of Systems: See HPI above.    Objective:  Physical  Exam:  Gen: NAD, comfortable in exam room  Back: No gross deformity, scoliosis. No TTP.  No midline or bony TTP. FROM with pain on flexion. Strength LEs 5/5 all muscle groups.   2+ MSRs in patellar and achilles tendons, equal bilaterally. Negative SLRs. Sensation intact to light touch bilaterally. Negative logroll bilateral hips with mild limitation IR. Negative fabers and piriformis stretches.    Assessment & Plan:  1. Low back pain - independently reviewed radiographs.  His history, exam, radiographs all consistent with arthritis.  He has tried physical therapy and modalities already without much benefit.  He will try prednisone dose pack, continue home exercises.  Discussed tylenol, topical medications, glucosamine as well.  Heat or ice.  Call us in a week for an update on his status.  If not improving  consider MRI of lumbar spine.  Addendum:  MRI reviewed and discussed with patient.  He has facet arthropathy and ligamentous hypertrophy at L4-5 and severe spinal stenosis at this level.  I advised him to let his wife know about this diagnosis to focus on flexion based rehab, avoid extension.  He would like to proceed with bilateral facet injections as well so will set these up - advised to let us know how he's doing a week after these injections.

## 2016-10-03 ENCOUNTER — Telehealth: Payer: Self-pay | Admitting: Family Medicine

## 2016-10-03 NOTE — Telephone Encounter (Signed)
Order placed for MRI

## 2016-10-03 NOTE — Telephone Encounter (Signed)
Ok to go ahead with lumbar spine MRI.  Thanks!

## 2016-10-03 NOTE — Addendum Note (Signed)
Addended by: Sherrie George F on: 10/03/2016 10:11 AM   Modules accepted: Orders

## 2016-10-07 DIAGNOSIS — L570 Actinic keratosis: Secondary | ICD-10-CM | POA: Diagnosis not present

## 2016-10-08 ENCOUNTER — Ambulatory Visit (HOSPITAL_BASED_OUTPATIENT_CLINIC_OR_DEPARTMENT_OTHER)
Admission: RE | Admit: 2016-10-08 | Discharge: 2016-10-08 | Disposition: A | Discharge status: Home / Self Care | Payer: 59 | Source: Ambulatory Visit | Attending: Family Medicine | Admitting: Family Medicine

## 2016-10-08 DIAGNOSIS — M79604 Pain in right leg: Secondary | ICD-10-CM

## 2016-10-08 DIAGNOSIS — M545 Low back pain: Secondary | ICD-10-CM

## 2016-10-08 DIAGNOSIS — M48061 Spinal stenosis, lumbar region without neurogenic claudication: Secondary | ICD-10-CM | POA: Diagnosis not present

## 2016-10-08 DIAGNOSIS — M4316 Spondylolisthesis, lumbar region: Secondary | ICD-10-CM | POA: Insufficient documentation

## 2016-10-10 ENCOUNTER — Other Ambulatory Visit: Payer: Self-pay | Admitting: Cardiovascular Disease

## 2016-10-11 ENCOUNTER — Telehealth: Payer: Self-pay | Admitting: Family Medicine

## 2016-10-11 ENCOUNTER — Other Ambulatory Visit: Payer: Self-pay | Admitting: Family Medicine

## 2016-10-11 DIAGNOSIS — M545 Low back pain: Secondary | ICD-10-CM

## 2016-10-11 MED FILL — ROSUVASTATIN CALCIUM 20 MG: 20 | 90 days supply | Qty: 90 | Fill #0

## 2016-10-11 NOTE — Telephone Encounter (Signed)
Spoke to patient and gave him information provided by the physician. Told him that the imaging center would contact him to set up the appointment.

## 2016-10-11 NOTE — Telephone Encounter (Signed)
When we discussed his MRI yesterday I let him know they will have access to the MRI and we are faxing the order to [place] - they should be calling him soon to schedule the shots.  Please give him a call and tell him the above (we talked about a lot so he may have forgotten).

## 2016-10-17 ENCOUNTER — Other Ambulatory Visit: Payer: 59

## 2016-10-25 ENCOUNTER — Ambulatory Visit
Admission: RE | Admit: 2016-10-25 | Discharge: 2016-10-25 | Disposition: A | Discharge status: Home / Self Care | Payer: 59 | Source: Ambulatory Visit | Attending: Family Medicine | Admitting: Family Medicine

## 2016-10-25 DIAGNOSIS — M545 Low back pain: Secondary | ICD-10-CM

## 2016-10-25 DIAGNOSIS — M5126 Other intervertebral disc displacement, lumbar region: Secondary | ICD-10-CM | POA: Diagnosis not present

## 2016-10-25 MED ORDER — IOPAMIDOL (ISOVUE-M 200) INJECTION 41%
1.0000 mL | Freq: Once | INTRAMUSCULAR | Status: AC
  Administered 2016-10-25: 1 mL via INTRA_ARTICULAR

## 2016-10-25 MED ORDER — METHYLPREDNISOLONE ACETATE 40 MG/ML INJ SUSP (RADIOLOG
120.0000 mg | Freq: Once | INTRAMUSCULAR | Status: AC
  Administered 2016-10-25: 120 mg via INTRA_ARTICULAR

## 2016-10-25 MED FILL — FLUTICASONE PROP 50 MCG SPR: 50 | 30 days supply | Qty: 16 | Fill #4

## 2016-10-25 NOTE — Discharge Instructions (Signed)

## 2016-11-02 ENCOUNTER — Telehealth: Payer: Self-pay | Admitting: Family Medicine

## 2016-11-02 NOTE — Telephone Encounter (Signed)
I would check with patient first to see if he's only having issues on the left side.  If he is, would only order facet injection on the left at L4-5.  If having problems with both sides would do bilateral facet injections at this level.  Thanks!

## 2016-11-03 ENCOUNTER — Other Ambulatory Visit: Payer: Self-pay | Admitting: Family Medicine

## 2016-11-03 DIAGNOSIS — G8929 Other chronic pain: Secondary | ICD-10-CM

## 2016-11-03 DIAGNOSIS — M545 Low back pain: Principal | ICD-10-CM

## 2016-11-03 NOTE — Telephone Encounter (Signed)
Order placed

## 2016-11-07 ENCOUNTER — Telehealth: Payer: Self-pay | Admitting: Family Medicine

## 2016-11-07 NOTE — Telephone Encounter (Signed)
I'd have to know what the radiologist was talking about - I'm guessing he was talking about trying ESI instead of facet injection but I don't see that in the notes.

## 2016-11-07 NOTE — Telephone Encounter (Signed)
Spoke to patient and patient said that he called [facility] and the doctor that gave him the injection would be back on Thursday. Patient stated that they would call him on Thursday to let him know what kind of injection and then patient will call our office.

## 2016-11-10 ENCOUNTER — Telehealth: Payer: Self-pay | Admitting: Family Medicine

## 2016-11-10 NOTE — Telephone Encounter (Signed)
Ok to order bilateral L4-5 ESIs for him.  Thanks!

## 2016-11-10 NOTE — Telephone Encounter (Signed)
Spoke to patient and told him that the order has been sent to [place].

## 2016-11-11 ENCOUNTER — Other Ambulatory Visit: Payer: Self-pay | Admitting: Family Medicine

## 2016-11-11 DIAGNOSIS — G8929 Other chronic pain: Secondary | ICD-10-CM

## 2016-11-11 DIAGNOSIS — M545 Low back pain: Principal | ICD-10-CM

## 2016-11-22 ENCOUNTER — Other Ambulatory Visit: Payer: Self-pay | Admitting: Family Medicine

## 2016-11-22 ENCOUNTER — Ambulatory Visit
Admission: RE | Admit: 2016-11-22 | Discharge: 2016-11-22 | Disposition: A | Discharge status: Home / Self Care | Payer: 59 | Source: Ambulatory Visit | Attending: Family Medicine | Admitting: Family Medicine

## 2016-11-22 DIAGNOSIS — M545 Low back pain, unspecified: Secondary | ICD-10-CM

## 2016-11-22 DIAGNOSIS — G8929 Other chronic pain: Secondary | ICD-10-CM

## 2016-11-22 DIAGNOSIS — M48061 Spinal stenosis, lumbar region without neurogenic claudication: Secondary | ICD-10-CM | POA: Diagnosis not present

## 2016-11-22 MED ORDER — METHYLPREDNISOLONE ACETATE 40 MG/ML INJ SUSP (RADIOLOG
120.0000 mg | Freq: Once | INTRAMUSCULAR | Status: AC
  Administered 2016-11-22: 120 mg via EPIDURAL

## 2016-11-22 MED ORDER — IOPAMIDOL (ISOVUE-M 200) INJECTION 41%
1.0000 mL | Freq: Once | INTRAMUSCULAR | Status: AC
  Administered 2016-11-22: 1 mL via EPIDURAL

## 2016-11-22 NOTE — Discharge Instructions (Signed)

## 2016-12-02 MED FILL — FLUTICASONE PROP 50 MCG SPR: 50 | 30 days supply | Qty: 16 | Fill #5

## 2016-12-02 MED FILL — OMEPRAZOLE DR 20 MG CAPSULE: 20 | 90 days supply | Qty: 90 | Fill #1

## 2017-01-06 MED FILL — ROSUVASTATIN CALCIUM 20 MG: 20 | 90 days supply | Qty: 90 | Fill #1

## 2017-01-06 MED FILL — FLUTICASONE PROP 50 MCG SPR: 50 | 30 days supply | Qty: 16 | Fill #6

## 2017-03-10 ENCOUNTER — Other Ambulatory Visit: Payer: Self-pay | Admitting: Family Medicine

## 2017-03-10 MED FILL — OMEPRAZOLE DR 20 MG CAPSULE: 20 | 90 days supply | Qty: 90 | Fill #2

## 2017-03-10 MED FILL — FLUTICASONE PROP 50 MCG SPR: 50 | 30 days supply | Qty: 16 | Fill #0

## 2017-03-22 ENCOUNTER — Ambulatory Visit (INDEPENDENT_AMBULATORY_CARE_PROVIDER_SITE_OTHER): Payer: 59 | Admitting: Family Medicine

## 2017-03-22 ENCOUNTER — Encounter: Payer: Self-pay | Admitting: Family Medicine

## 2017-03-22 VITALS — BP 132/90 | HR 75 | Temp 98.9°F | Resp 18 | Ht 71.0 in | Wt 200.4 lb

## 2017-03-22 DIAGNOSIS — J0100 Acute maxillary sinusitis, unspecified: Secondary | ICD-10-CM

## 2017-03-22 MED ORDER — GUAIFENESIN-CODEINE 100-10 MG/5ML PO SYRP: 10.0000 mL | ORAL_SOLUTION | Freq: Three times a day (TID) | ORAL | 0 refills | Status: DC | PRN

## 2017-03-22 MED ORDER — AMOXICILLIN 875 MG PO TABS: 875.0000 mg | ORAL_TABLET | Freq: Two times a day (BID) | ORAL | 0 refills | Status: DC

## 2017-03-22 MED ORDER — PROMETHAZINE-DM 6.25-15 MG/5ML PO SYRP: 5.0000 mL | ORAL_SOLUTION | Freq: Four times a day (QID) | ORAL | 0 refills | Status: DC | PRN

## 2017-03-22 MED FILL — PROMETHAZINE-DM SYRUP: 6.25-15 | 12 days supply | Qty: 240 | Fill #0

## 2017-03-22 MED FILL — AMOXICILLIN 875 MG TABLET: 875 | 10 days supply | Qty: 20 | Fill #0

## 2017-03-22 NOTE — Addendum Note (Signed)
Addended by: Midge Minium on: 03/22/2017 10:16 AM   Modules accepted: Orders

## 2017-03-22 NOTE — Progress Notes (Signed)
Pre visit review using our clinic review tool, if applicable. No additional management support is needed unless otherwise documented below in the visit note. 

## 2017-03-22 NOTE — Patient Instructions (Signed)
Follow up as needed/scheduled Start the Amoxicillin twice daily- take w/ food Drink plenty of fluids Mucinex DM for daytime cough Cough syrup for nights/weekends- will cause drowsiness Call with any questions or concerns Hang in there!!!

## 2017-03-22 NOTE — Assessment & Plan Note (Signed)
[name] sxs and PE consistent w/ infxn.  Start abx.  Cough meds prn.  Reviewed supportive care and red flags that should prompt return.  Pt expressed understanding and is in agreement w/ plan.  

## 2017-03-22 NOTE — Progress Notes (Signed)
   Subjective:    Patient ID: Dan Sanchez, male    DOB: [birth year], 61 y.o.   MRN: [id number]  HPI URI- sxs started Saturday w/ increased nasal congestion and drainage.  Subjective fever Friday.  + sinus pain/pressure.  No tooth pain.  No N/V.  + cough- productive of green sputum.  Bilateral ear pain.  + sick contacts.  Mild body aches.   Review of Systems For ROS see HPI     Objective:   Physical Exam  Constitutional: He is oriented to person, place, and time. He appears well-developed and well-nourished. No distress.  HENT:  Head: Normocephalic and atraumatic.  Right Ear: Tympanic membrane normal.  Left Ear: Tympanic membrane normal.  Nose: Mucosal edema and rhinorrhea present. Right sinus exhibits maxillary sinus tenderness and frontal sinus tenderness. Left sinus exhibits maxillary sinus tenderness and frontal sinus tenderness.  Mouth/Throat: Mucous membranes are normal. Oropharyngeal exudate and posterior oropharyngeal erythema present. No posterior oropharyngeal edema.  + PND  Eyes: Conjunctivae and EOM are normal. Pupils are equal, round, and reactive to light.  Neck: Normal range of motion. Neck supple.  Cardiovascular: Normal rate, regular rhythm and normal heart sounds.   Pulmonary/Chest: Effort normal and breath sounds normal. No respiratory distress. He has no wheezes.  + hacking cough  Lymphadenopathy:    He has no cervical adenopathy.  Neurological: He is alert and oriented to person, place, and time.  Skin: Skin is warm and dry.  Psychiatric: He has a normal mood and affect. His behavior is normal. Thought content normal.  Vitals reviewed.         Assessment & Plan:

## 2017-04-06 ENCOUNTER — Other Ambulatory Visit: Payer: Self-pay | Admitting: Cardiovascular Disease

## 2017-04-06 MED FILL — ROSUVASTATIN CALCIUM 20 MG: 20 | 60 days supply | Qty: 60 | Fill #0

## 2017-04-14 ENCOUNTER — Telehealth: Payer: Self-pay | Admitting: Family Medicine

## 2017-04-14 ENCOUNTER — Other Ambulatory Visit: Payer: Self-pay | Admitting: Family Medicine

## 2017-04-14 DIAGNOSIS — G8929 Other chronic pain: Secondary | ICD-10-CM

## 2017-04-14 DIAGNOSIS — M545 Low back pain: Principal | ICD-10-CM

## 2017-04-14 NOTE — Telephone Encounter (Signed)
Patient requesting an order be placed to [facility] for him to receive another shot in his back.  Patient is leaving the country in 3 weeks and would like shot prior to trip

## 2017-04-14 NOTE — Telephone Encounter (Signed)
The shots would be bilateral L4 nerve root blocks and epidurals.  Ok to order these.  Thanks!

## 2017-04-14 NOTE — Telephone Encounter (Signed)
Order sent.

## 2017-04-17 ENCOUNTER — Telehealth: Payer: Self-pay | Admitting: Cardiovascular Disease

## 2017-04-17 NOTE — Telephone Encounter (Signed)
Left message to call back  

## 2017-04-17 NOTE — Telephone Encounter (Signed)
New message     Please call pt with lab appointment , for his yearly blood work

## 2017-04-19 ENCOUNTER — Other Ambulatory Visit: Payer: Self-pay | Admitting: Family Medicine

## 2017-04-19 ENCOUNTER — Ambulatory Visit
Admission: RE | Admit: 2017-04-19 | Discharge: 2017-04-19 | Disposition: A | Discharge status: Home / Self Care | Payer: 59 | Source: Ambulatory Visit | Attending: Family Medicine | Admitting: Family Medicine

## 2017-04-19 DIAGNOSIS — M545 Low back pain: Secondary | ICD-10-CM | POA: Diagnosis not present

## 2017-04-19 DIAGNOSIS — G8929 Other chronic pain: Secondary | ICD-10-CM

## 2017-04-19 MED ORDER — IOPAMIDOL (ISOVUE-M 200) INJECTION 41%
1.0000 mL | Freq: Once | INTRAMUSCULAR | Status: AC
  Administered 2017-04-19: 1 mL via EPIDURAL

## 2017-04-19 MED ORDER — METHYLPREDNISOLONE ACETATE 40 MG/ML INJ SUSP (RADIOLOG
120.0000 mg | Freq: Once | INTRAMUSCULAR | Status: AC
  Administered 2017-04-19: 120 mg via EPIDURAL

## 2017-04-19 NOTE — Discharge Instructions (Signed)

## 2017-04-26 NOTE — Telephone Encounter (Signed)
Left message to call back  

## 2017-04-26 NOTE — Telephone Encounter (Signed)
Lipid and Liver profiles were last checked by primary care in 10/17.  I spoke with pt and told him he did not need lab work checked prior to appt with Dr. Angelena Form in July.

## 2017-05-10 MED FILL — FLUTICASONE PROP 50 MCG SPR: 50 | 30 days supply | Qty: 16 | Fill #1

## 2017-05-19 ENCOUNTER — Encounter: Payer: Self-pay | Admitting: *Deleted

## 2017-06-08 ENCOUNTER — Encounter: Payer: Self-pay | Admitting: Cardiovascular Disease

## 2017-06-08 ENCOUNTER — Ambulatory Visit (INDEPENDENT_AMBULATORY_CARE_PROVIDER_SITE_OTHER): Payer: 59 | Admitting: Cardiovascular Disease

## 2017-06-08 VITALS — BP 126/80 | HR 70 | Ht 71.0 in | Wt 205.4 lb

## 2017-06-08 DIAGNOSIS — I2510 Atherosclerotic heart disease of native coronary artery without angina pectoris: Secondary | ICD-10-CM | POA: Diagnosis not present

## 2017-06-08 DIAGNOSIS — E7800 Pure hypercholesterolemia, unspecified: Secondary | ICD-10-CM

## 2017-06-08 MED ORDER — ROSUVASTATIN CALCIUM 20 MG PO TABS: 20.0000 mg | ORAL_TABLET | Freq: Every day | ORAL | 3 refills | Status: DC

## 2017-06-08 MED FILL — ROSUVASTATIN CALCIUM 20 MG: 20 | 90 days supply | Qty: 90 | Fill #0

## 2017-06-08 NOTE — Progress Notes (Signed)
Chief Complaint  Patient presents with  . Follow-up    CAD    History of Present Illness: 61 yo male with history of CAD, HLD here today for cardiac follow up. I met him in the cath lab in September 2015. He had an abnormal outpatient stress test arranged by primary care. During the GXT he had ST elevation in AVR, AVL. No chest pain. Cardiac cath 08/13/14 with mild disease in the LAD (20%) and RCA (20%). LVEF normal.   He is here today for follow up. The patient denies any chest pain, dyspnea, palpitations, lower extremity edema, orthopnea, PND, dizziness, near syncope or syncope.    Primary Care Physician: Midge Minium, MD  Past Medical History:  Diagnosis Date  . Allergy   . CAD (coronary artery disease)   . Hyperlipidemia   . Urinary incontinence     Past Surgical History:  Procedure Laterality Date  . LEFT HEART CATHETERIZATION WITH CORONARY ANGIOGRAM N/A 08/13/2014   Procedure: LEFT HEART CATHETERIZATION WITH CORONARY ANGIOGRAM;  Surgeon: Burnell Blanks, MD;  Location: [facility] CATH LAB;  Service: Cardiovascular;  Laterality: N/A;  . SHOULDER SURGERY     Right shoulder     Current Outpatient Prescriptions  Medication Sig Dispense Refill  . Ascorbic Acid (VITAMIN C) 1000 MG tablet Take 1,000 mg by mouth daily.    Marland Kitchen aspirin EC 325 MG tablet Take 325 mg by mouth at bedtime.    . cetirizine (ZYRTEC) 10 MG tablet Take 10 mg by mouth daily.    Marland Kitchen EPINEPHrine (EPIPEN 2-PAK) 0.3 mg/0.3 mL IJ SOAJ injection Inject 0.3 mLs (0.3 mg total) into the muscle once. 4 Device 1  . fluticasone (FLONASE) 50 MCG/ACT nasal spray PLACE 2 SPRAYS INTO BOTH NOSTRILS DAILY. 16 g 6  . Multiple Vitamin (MULTIVITAMIN WITH MINERALS) TABS tablet Take 1 tablet by mouth daily.    . Omega-3 Fatty Acids (FISH OIL) 1200 MG CAPS Take 1,200 mg by mouth 2 (two) times daily.    Marland Kitchen omeprazole (PRILOSEC) 20 MG capsule TAKE 1 CAPSULE (20 MG TOTAL) BY MOUTH DAILY. 90 capsule 3  . valACYclovir (VALTREX)  1000 MG tablet Take 1,000 mg by mouth 2 (two) times daily. At the sight of a cold sore    . rosuvastatin (CRESTOR) 20 MG tablet Take 1 tablet (20 mg total) by mouth daily. 90 tablet 3   No current facility-administered medications for this visit.     No Known Allergies  Social History   Social History  . Marital status: Married    Spouse name: N/A  . Number of children: N/A  . Years of education: N/A   Occupational History  . [employer]   Social History Main Topics  . Smoking status: Never Smoker  . Smokeless tobacco: Never Used  . Alcohol use 2.0 oz/week    4 Standard drinks or equivalent per week  . Drug use: No  . Sexual activity: Not on file   Other Topics Concern  . Not on file   Social History Narrative  . No narrative on file    Family History  Problem Relation Age of Onset  . Heart disease Father        MI age 90    Review of Systems:  As stated in the HPI and otherwise negative.   BP 126/80   Pulse 70   Ht '5\' 11"'$  (1.803 m)   Wt 205 lb 6.4 oz (93.2 kg)  SpO2 97%   BMI 28.65 kg/m   Physical Examination: General: Well developed, well nourished, NAD  HEENT: OP clear, mucus membranes moist  SKIN: warm, dry. No rashes. Neuro: No focal deficits  Musculoskeletal: Muscle strength 5/5 all ext  Psychiatric: Mood and affect normal  Neck: No JVD, no carotid bruits, no thyromegaly, no lymphadenopathy.  Lungs:Clear bilaterally, no wheezes, rhonci, crackles Cardiovascular: Regular rate and rhythm. No murmurs, gallops or rubs. Abdomen:Soft. Bowel sounds present. Non-tender.  Extremities: No lower extremity edema. Pulses are 2 + in the bilateral DP/PT.  Cardiac cath 08/13/14: Hemodynamic Findings:  Central aortic pressure: 137/82  Left ventricular pressure: 144/8/14  Angiographic Findings:  Left main: No obstructive disease.  Left Anterior Descending Artery: Large caliber vessel that courses to the apex. There is a 20% stenosis in  the mid vessel. There are mild luminal irregularities in the distal vessel. There are several small caliber diagonal branches.  Circumflex Artery: Large caliber vessel with termination into an obtuse marginal branch. No obstructive disease.  Right Coronary Artery: Large dominant vessel with 20% proximal stenosis.  Left Ventricular Angiogram: LVEF=65-70%.   EKG:  EKG is ordered today. The ekg ordered today demonstrates NSR, rate 63 bpm. Non-specific T wave abnormality.   Recent Labs: 09/16/2016: ALT 38; BUN 25; Creatinine, Ser 1.07; Hemoglobin 14.8; Platelets 258.0; Potassium 4.2; Sodium 141; TSH 1.73   Lipid Panel    Component Value Date/Time   CHOL 186 09/16/2016 0844   TRIG 125.0 09/16/2016 0844   TRIG 81 10/04/2006 1227   HDL 66.60 09/16/2016 0844   CHOLHDL 3 09/16/2016 0844   VLDL 25.0 09/16/2016 0844   LDLCALC 94 09/16/2016 0844   LDLDIRECT 120.9 03/16/2012 1054     Wt Readings from Last 3 Encounters:  06/08/17 205 lb 6.4 oz (93.2 kg)  03/22/17 200 lb 6 oz (90.9 kg)  09/26/16 198 lb (89.8 kg)     Other studies Reviewed: Additional studies/ records that were reviewed today include: . Review of the above records demonstrates:    Assessment and Plan:   1. CAD without angina: Mild CAD by cath in September 2015. No chest pain suggestive of angina. Will continue ASA and Crestor. I have asked him to reduce his ASA to 81 mg daily but he wishes to take 325 mg daily.      2. HLD: Lipids followed in primary care. Continue statin.   Current medicines are reviewed at length with the patient today.  The patient does not have concerns regarding medicines.  The following changes have been made:  no change  Labs/ tests ordered today include:   Orders Placed This Encounter  Procedures  . EKG 12-Lead    Disposition:   FU with me in 12  months  Signed, Lauree Chandler, MD 06/08/2017 4:53 PM    [facility] [address], [place], [place]   [address] Phone: [phone]; Fax: [phone]

## 2017-06-08 NOTE — Patient Instructions (Signed)

## 2017-06-09 MED FILL — OMEPRAZOLE DR 20 MG CAPSULE: 20 | 90 days supply | Qty: 90 | Fill #3

## 2017-06-30 DIAGNOSIS — M25511 Pain in right shoulder: Secondary | ICD-10-CM | POA: Diagnosis not present

## 2017-06-30 DIAGNOSIS — M75111 Incomplete rotator cuff tear or rupture of right shoulder, not specified as traumatic: Secondary | ICD-10-CM | POA: Diagnosis not present

## 2017-07-21 MED FILL — FLUTICASONE PROP 50 MCG SPR: 50 | 30 days supply | Qty: 16 | Fill #2

## 2017-07-26 ENCOUNTER — Encounter: Payer: Self-pay | Admitting: Family Medicine

## 2017-07-26 ENCOUNTER — Ambulatory Visit (INDEPENDENT_AMBULATORY_CARE_PROVIDER_SITE_OTHER): Payer: 59 | Admitting: Family Medicine

## 2017-07-26 VITALS — BP 124/81 | HR 75 | Temp 98.8°F | Resp 16 | Ht 71.0 in | Wt 195.1 lb

## 2017-07-26 DIAGNOSIS — J019 Acute sinusitis, unspecified: Secondary | ICD-10-CM | POA: Diagnosis not present

## 2017-07-26 DIAGNOSIS — J340 Abscess, furuncle and carbuncle of nose: Secondary | ICD-10-CM | POA: Diagnosis not present

## 2017-07-26 DIAGNOSIS — B9689 Other specified bacterial agents as the cause of diseases classified elsewhere: Secondary | ICD-10-CM

## 2017-07-26 MED ORDER — DOXYCYCLINE HYCLATE 100 MG PO TABS
100.0000 mg | ORAL_TABLET | Freq: Two times a day (BID) | ORAL | 0 refills | Status: DC
Start: 2017-07-26 — End: 2017-09-18

## 2017-07-26 MED FILL — DOXYCYCLINE HYCLATE 100 MG: 100 | 10 days supply | Qty: 20 | Fill #0

## 2017-07-26 NOTE — Patient Instructions (Signed)
Follow up as needed/scheduled Start the Doxycycline twice daily- take w/ food Avoid direct and prolonged sun exposure while on antibiotics Drink plenty of fluids Warm compresses to the nose Call with any questions or concerns Hang in there! Happy Labor Day!!!

## 2017-07-26 NOTE — Progress Notes (Signed)
Pre visit review using our clinic review tool, if applicable. No additional management support is needed unless otherwise documented below in the visit note. 

## 2017-07-26 NOTE — Progress Notes (Signed)
   Subjective:    Patient ID: Dan Sanchez, male    DOB: [birth year], 61 y.o.   MRN: [id number]  HPI URI- 'i have a sinus infection and I have a big bump in my nose that's been killing me'.  Pt reports facial pain/pressure- both frontal and maxillary.  Bilateral ear fullness.  + tooth pain.  No fevers.  No known sick contacts.  Nasal sore developed 4 days ago.  Attempted to pop area but this did not help and now nose is red and swollen.  Sore is in L nostril.   Review of Systems For ROS see HPI     Objective:   Physical Exam  Constitutional: He appears well-developed and well-nourished. No distress.  HENT:  Head: Normocephalic and atraumatic.  Right Ear: Tympanic membrane normal.  Left Ear: Tympanic membrane normal.  Nose: Mucosal edema and rhinorrhea present. Right sinus exhibits maxillary sinus tenderness and frontal sinus tenderness. Left sinus exhibits maxillary sinus tenderness and frontal sinus tenderness.  Mouth/Throat: Mucous membranes are normal. Oropharyngeal exudate and posterior oropharyngeal erythema present. No posterior oropharyngeal edema.  + PND + redness and swelling of nasal tip w/ small white head in L nostril w/o abscess or area to drain  Eyes: Pupils are equal, round, and reactive to light. Conjunctivae and EOM are normal.  Neck: Normal range of motion. Neck supple.  Cardiovascular: Normal rate, regular rhythm and normal heart sounds.   Pulmonary/Chest: Effort normal and breath sounds normal. No respiratory distress. He has no wheezes.  + hacking cough  Lymphadenopathy:    He has no cervical adenopathy.  Skin: Skin is warm and dry. There is erythema (pt w/ redness and swelling of nasal tip, very TTP).          Assessment & Plan:  Bacterial sinusitis- recurrent issue for pt.  Given his nasal cellulitis, will start Doxycycline rather than Amox.  Reviewed supportive care and red flags that should prompt return.  Pt expressed understanding and is in agreement w/  plan.

## 2017-08-17 ENCOUNTER — Telehealth: Payer: Self-pay | Admitting: Family Medicine

## 2017-08-17 NOTE — Telephone Encounter (Signed)
Pt called in asking why on his EOB with insurance his Aug appt cost more than his April appt when he was seen for the same thing. I advise pt that his April appt was billed as a 15 min appt and in Aug it was billed as a 25 min appt due to time spent discuss issues at time of appt. Pt stated "So if she (Dr. Birdie Riddle) comes in and asked how my son at [facility] is doing or my family is doing, I should just keep my mouth shut so that I won't be bill for the small talk."  I said no Sir that is not how your visits are billed. That you are billed for the issues discussed and not for the time spent , that I do not have access of what office visits cost and offered to get a message to someone who could assist him. Pt was very rude and I knew anything I tried to say was not going to please him. Pt stated that he tried to get a refill on an abx and was denied, needing to be seen first. He said he did want a call back regarding cost.

## 2017-08-17 NOTE — Telephone Encounter (Signed)
Pt was billed a level 4 visit in August b/c we covered his sinus infection and cellulitis of his nose whereas in April, it was just a sinus infection.  Visits are billed based on medical complexity and not 'small talk' as pt put it.  His suggestion that I would do something like this is offensive and shows a lack of trust in our doctor/patient relationship.  Also, abx are not refillable.  If someone has an infection that is new or different from one that was seen more than 2 weeks ago, an appt is required.  Again, if that is not suitable for him, I am sorry.  Regardless, rudeness to staff is not tolerated in this office and if this happens again, he will be dismissed from the practice.  Estill Bamberg- please make this known to him when you call him back regarding the cost issue.

## 2017-08-24 NOTE — Telephone Encounter (Signed)
Called patient and LMOM to call back if he wanted to discuss cost.

## 2017-09-04 DIAGNOSIS — M25511 Pain in right shoulder: Secondary | ICD-10-CM | POA: Diagnosis not present

## 2017-09-08 ENCOUNTER — Other Ambulatory Visit: Payer: Self-pay | Admitting: Family Medicine

## 2017-09-08 MED FILL — ROSUVASTATIN CALCIUM 20 MG: 20 | 90 days supply | Qty: 90 | Fill #1

## 2017-09-08 MED FILL — OMEPRAZOLE 20 MG CAP: 20 | 90 days supply | Qty: 90 | Fill #0

## 2017-09-11 ENCOUNTER — Telehealth: Payer: Self-pay | Admitting: Family Medicine

## 2017-09-11 DIAGNOSIS — S46011D Strain of muscle(s) and tendon(s) of the rotator cuff of right shoulder, subsequent encounter: Secondary | ICD-10-CM | POA: Diagnosis not present

## 2017-09-11 MED FILL — FLUTICASONE PROP 50 MCG SPR: 50 | 30 days supply | Qty: 16 | Fill #3

## 2017-09-11 NOTE — Telephone Encounter (Signed)
noted 

## 2017-09-11 NOTE — Telephone Encounter (Signed)
Dan Sanchez, CMA at [facility], received a call from someone identifying themselves as [place] customer service. They stated that the patient never received a callback on his complaint from September around billing. Bethany notified the person that the patient was called back on September 29 and a message was left for the patient. They asked that I call the patients wife, Dan Sanchez back to discuss billing concerns.  I reached out to [place] shortly there after. Dan Sanchez stated that she accessed her husbands MyChart account and saw the phone note entered by Levada Dy when her husband contacted [place] initially about the billing concerns. She stated she also read Dr Rande Lawman response that was in the chart. Dan Sanchez wants to make sure that there is no tension between the provider an office with her self and her husband.   They want to keep their care with Dr. Birdie Riddle and wife said she was dismayed to see the message that was in the chart about the potential for her husband to be dismissed. We discussed and resolved the billing issue at hand. I explained that part of the delay in calling them back was that I had asked coding to review for accuracy and had been out of the office for vacation. I also expressed understanding at how reading that phone note mustve landed on them.  Dan Sanchez also said that her husband may mention it during his physical as well in order to help clear the air.

## 2017-09-13 ENCOUNTER — Ambulatory Visit: Payer: Self-pay | Admitting: Orthopedic Surgery

## 2017-09-18 ENCOUNTER — Encounter: Payer: Self-pay | Admitting: Family Medicine

## 2017-09-18 ENCOUNTER — Ambulatory Visit (INDEPENDENT_AMBULATORY_CARE_PROVIDER_SITE_OTHER): Payer: 59 | Admitting: Family Medicine

## 2017-09-18 VITALS — BP 135/81 | HR 75 | Temp 98.1°F | Resp 16 | Ht 71.0 in | Wt 194.0 lb

## 2017-09-18 DIAGNOSIS — Z0000 Encounter for general adult medical examination without abnormal findings: Secondary | ICD-10-CM | POA: Diagnosis not present

## 2017-09-18 DIAGNOSIS — Z23 Encounter for immunization: Secondary | ICD-10-CM

## 2017-09-18 DIAGNOSIS — E785 Hyperlipidemia, unspecified: Secondary | ICD-10-CM

## 2017-09-18 LAB — CBC WITH DIFFERENTIAL/PLATELET
Basophils Absolute: 0 10*3/uL (ref 0.0–0.1)
Basophils Relative: 1.1 % (ref 0.0–3.0)
EOS ABS: 0.1 10*3/uL (ref 0.0–0.7)
Eosinophils Relative: 3.4 % (ref 0.0–5.0)
HCT: 44.7 % (ref 39.0–52.0)
HEMOGLOBIN: 15.2 g/dL (ref 13.0–17.0)
Lymphocytes Relative: 33.3 % (ref 12.0–46.0)
Lymphs Abs: 1.4 10*3/uL (ref 0.7–4.0)
MCHC: 34 g/dL (ref 30.0–36.0)
MCV: 89.8 fl (ref 78.0–100.0)
MONO ABS: 0.3 10*3/uL (ref 0.1–1.0)
Monocytes Relative: 7.9 % (ref 3.0–12.0)
Neutro Abs: 2.3 10*3/uL (ref 1.4–7.7)
Neutrophils Relative %: 54.3 % (ref 43.0–77.0)
Platelets: 274 10*3/uL (ref 150.0–400.0)
RBC: 4.97 Mil/uL (ref 4.22–5.81)
RDW: 13.1 % (ref 11.5–15.5)
WBC: 4.2 10*3/uL (ref 4.0–10.5)

## 2017-09-18 LAB — HEPATIC FUNCTION PANEL
ALBUMIN: 4.6 g/dL (ref 3.5–5.2)
ALT: 29 U/L (ref 0–53)
AST: 19 U/L (ref 0–37)
Alkaline Phosphatase: 55 U/L (ref 39–117)
Bilirubin, Direct: 0.1 mg/dL (ref 0.0–0.3)
Total Bilirubin: 0.6 mg/dL (ref 0.2–1.2)
Total Protein: 6.9 g/dL (ref 6.0–8.3)

## 2017-09-18 LAB — BASIC METABOLIC PANEL
BUN: 19 mg/dL (ref 6–23)
CHLORIDE: 105 meq/L (ref 96–112)
CO2: 30 mEq/L (ref 19–32)
CREATININE: 1.02 mg/dL (ref 0.40–1.50)
Calcium: 9.7 mg/dL (ref 8.4–10.5)
GFR: 79.41 mL/min (ref >60.00)
Glucose, Bld: 99 mg/dL (ref 70–99)
Potassium: 4.2 mEq/L (ref 3.5–5.1)
Sodium: 143 mEq/L (ref 135–145)

## 2017-09-18 LAB — LIPID PANEL
CHOL/HDL RATIO: 2
Cholesterol: 177 mg/dL (ref 0–200)
HDL: 70.9 mg/dL (ref >39.00)
LDL CALC: 84 mg/dL (ref 0–99)
NonHDL: 106.04
TRIGLYCERIDES: 109 mg/dL (ref 0.0–149.0)
VLDL: 21.8 mg/dL (ref 0.0–40.0)

## 2017-09-18 LAB — TSH: TSH: 1.49 u[IU]/mL (ref 0.35–4.50)

## 2017-09-18 LAB — PSA: PSA: 1.42 ng/mL (ref 0.10–4.00)

## 2017-09-18 NOTE — Progress Notes (Signed)
   Subjective:    Patient ID: Dan Sanchez, male    DOB: Oct 25, 1956, 61 y.o.   MRN: [id number]  HPI CPE- UTD on colonoscopy.  Due for flu today.   Review of Systems Patient reports no vision/hearing changes, anorexia, fever ,adenopathy, persistant/recurrent hoarseness, swallowing issues, chest pain, palpitations, edema, persistant/recurrent cough, hemoptysis, dyspnea (rest,exertional, paroxysmal nocturnal), gastrointestinal  bleeding (melena, rectal bleeding), abdominal pain, excessive heart burn, GU symptoms (dysuria, hematuria, voiding/incontinence issues) syncope, focal weakness, memory loss, numbness & tingling, skin/hair/nail changes, depression, anxiety, abnormal bruising/bleeding, musculoskeletal symptoms/signs.     Objective:   Physical Exam BP 135/81   Pulse 75   Temp 98.1 F (36.7 C) (Oral)   Resp 16   Ht 5\' 11"  (1.803 m)   Wt 194 lb (88 kg)   SpO2 98%   BMI 27.06 kg/m   General Appearance:    Alert, cooperative, no distress, appears stated age  Head:    Normocephalic, without obvious abnormality, atraumatic  Eyes:    PERRL, conjunctiva/corneas clear, EOM's intact, fundi    benign, both eyes       Ears:    Normal TM's and external ear canals, both ears  Nose:   Nares normal, septum midline, mucosa normal, no drainage   or sinus tenderness  Throat:   Lips, mucosa, and tongue normal; teeth and gums normal  Neck:   Supple, symmetrical, trachea midline, no adenopathy;       thyroid:  No enlargement/tenderness/nodules  Back:     Symmetric, no curvature, ROM normal, no CVA tenderness  Lungs:     Clear to auscultation bilaterally, respirations unlabored  Chest wall:    No tenderness or deformity  Heart:    Regular rate and rhythm, S1 and S2 normal, no murmur, rub   or gallop  Abdomen:     Soft, non-tender, bowel sounds active all four quadrants,    no masses, no organomegaly  Genitalia:    Normal male without lesion, discharge or tenderness  Rectal:    Normal tone,  normal prostate, no masses or tenderness  Extremities:   Extremities normal, atraumatic, no cyanosis or edema  Pulses:   2+ and symmetric all extremities  Skin:   Skin color, texture, turgor normal, no rashes or lesions  Lymph nodes:   Cervical, supraclavicular, and axillary nodes normal  Neurologic:   CNII-XII intact. Normal strength, sensation and reflexes      throughout          Assessment & Plan:

## 2017-09-18 NOTE — Patient Instructions (Signed)
Follow up in 6 months to recheck cholesterol We'll notify you of your lab results and make any changes if needed Continue to work on healthy diet and regular exercise Call with any questions or concerns Good Luck with surgery!!!

## 2017-09-18 NOTE — Assessment & Plan Note (Signed)
[name] PE WNL.  UTD on colonoscopy.  Flu shot given.  Check labs.  Anticipatory guidance provided.

## 2017-09-18 NOTE — Assessment & Plan Note (Signed)
Tolerating statin w/o difficulty.  Check labs.  Adjust meds prn  

## 2017-09-19 ENCOUNTER — Encounter: Payer: Self-pay | Admitting: General Practice

## 2017-10-18 ENCOUNTER — Encounter (HOSPITAL_BASED_OUTPATIENT_CLINIC_OR_DEPARTMENT_OTHER): Payer: Self-pay

## 2017-10-18 ENCOUNTER — Other Ambulatory Visit: Payer: Self-pay

## 2017-10-18 NOTE — Progress Notes (Signed)
Spoke with Dan Sanchez and Angie informed to not eat food after midnight 10/25/17, may have clear liquids until 8:00 AM DOS, no creamers, milk, gum, candy, or mints.Arrival time 12noon.  Will need Istat 4 DOS.  Patient instructed to take the following medications with sip of water Omeprazole and Zyrtec.  Pre op orders are in epic. Angie Cutright will be his ride home [phone].

## 2017-10-25 DIAGNOSIS — L821 Other seborrheic keratosis: Secondary | ICD-10-CM | POA: Diagnosis not present

## 2017-10-25 DIAGNOSIS — D485 Neoplasm of uncertain behavior of skin: Secondary | ICD-10-CM | POA: Diagnosis not present

## 2017-10-25 DIAGNOSIS — L570 Actinic keratosis: Secondary | ICD-10-CM | POA: Diagnosis not present

## 2017-10-25 DIAGNOSIS — D0422 Carcinoma in situ of skin of left ear and external auricular canal: Secondary | ICD-10-CM | POA: Diagnosis not present

## 2017-10-26 ENCOUNTER — Ambulatory Visit (HOSPITAL_BASED_OUTPATIENT_CLINIC_OR_DEPARTMENT_OTHER)
Admission: RE | Admit: 2017-10-26 | Discharge: 2017-10-26 | Disposition: A | Discharge status: Home / Self Care | Payer: 59 | Source: Ambulatory Visit | Attending: Specialist | Admitting: Specialist

## 2017-10-26 ENCOUNTER — Ambulatory Visit (HOSPITAL_BASED_OUTPATIENT_CLINIC_OR_DEPARTMENT_OTHER): Payer: 59 | Admitting: Anesthesiology

## 2017-10-26 ENCOUNTER — Encounter (HOSPITAL_BASED_OUTPATIENT_CLINIC_OR_DEPARTMENT_OTHER): Payer: Self-pay | Admitting: Anesthesiology

## 2017-10-26 ENCOUNTER — Other Ambulatory Visit: Payer: Self-pay

## 2017-10-26 ENCOUNTER — Encounter (HOSPITAL_BASED_OUTPATIENT_CLINIC_OR_DEPARTMENT_OTHER)
Admission: RE | Disposition: A | Discharge status: Home / Self Care | Payer: Self-pay | Source: Ambulatory Visit | Attending: Specialist

## 2017-10-26 DIAGNOSIS — K219 Gastro-esophageal reflux disease without esophagitis: Secondary | ICD-10-CM | POA: Insufficient documentation

## 2017-10-26 DIAGNOSIS — Z7982 Long term (current) use of aspirin: Secondary | ICD-10-CM | POA: Insufficient documentation

## 2017-10-26 DIAGNOSIS — G8918 Other acute postprocedural pain: Secondary | ICD-10-CM | POA: Diagnosis not present

## 2017-10-26 DIAGNOSIS — S46091A Other injury of muscle(s) and tendon(s) of the rotator cuff of right shoulder, initial encounter: Secondary | ICD-10-CM | POA: Diagnosis not present

## 2017-10-26 DIAGNOSIS — I2510 Atherosclerotic heart disease of native coronary artery without angina pectoris: Secondary | ICD-10-CM | POA: Diagnosis not present

## 2017-10-26 DIAGNOSIS — M75121 Complete rotator cuff tear or rupture of right shoulder, not specified as traumatic: Secondary | ICD-10-CM | POA: Insufficient documentation

## 2017-10-26 DIAGNOSIS — Z79899 Other long term (current) drug therapy: Secondary | ICD-10-CM | POA: Insufficient documentation

## 2017-10-26 DIAGNOSIS — M75101 Unspecified rotator cuff tear or rupture of right shoulder, not specified as traumatic: Secondary | ICD-10-CM | POA: Diagnosis not present

## 2017-10-26 DIAGNOSIS — M1990 Unspecified osteoarthritis, unspecified site: Secondary | ICD-10-CM | POA: Insufficient documentation

## 2017-10-26 DIAGNOSIS — Z9889 Other specified postprocedural states: Secondary | ICD-10-CM

## 2017-10-26 DIAGNOSIS — E785 Hyperlipidemia, unspecified: Secondary | ICD-10-CM | POA: Insufficient documentation

## 2017-10-26 HISTORY — DX: Gastro-esophageal reflux disease without esophagitis: K21.9

## 2017-10-26 HISTORY — DX: Other specified bacterial agents as the cause of diseases classified elsewhere: B96.89

## 2017-10-26 HISTORY — DX: Other specified bacterial agents as the cause of diseases classified elsewhere: J32.9

## 2017-10-26 HISTORY — PX: SHOULDER ARTHROSCOPY WITH ROTATOR CUFF REPAIR: SHX5685

## 2017-10-26 HISTORY — DX: Other specified postprocedural states: Z98.890

## 2017-10-26 LAB — POCT I-STAT 4, (NA,K, GLUC, HGB,HCT)
GLUCOSE: 96 mg/dL (ref 65–99)
HCT: 39 % (ref 39.0–52.0)
Hemoglobin: 13.3 g/dL (ref 13.0–17.0)
POTASSIUM: 3.9 mmol/L (ref 3.5–5.1)
SODIUM: 141 mmol/L (ref 135–145)

## 2017-10-26 SURGERY — ARTHROSCOPY, SHOULDER, WITH ROTATOR CUFF REPAIR
Anesthesia: General | Laterality: Right

## 2017-10-26 MED ORDER — OXYCODONE HCL 5 MG PO TABS: 5.0000 mg | ORAL_TABLET | ORAL | 0 refills | Status: DC | PRN

## 2017-10-26 MED ORDER — LIDOCAINE 2% (20 MG/ML) 5 ML SYRINGE
INTRAMUSCULAR | Status: AC
  Filled 2017-10-26: qty 10

## 2017-10-26 MED ORDER — ONDANSETRON HCL 4 MG PO TABS: 4.0000 mg | ORAL_TABLET | Freq: Three times a day (TID) | ORAL | 1 refills | Status: DC | PRN

## 2017-10-26 MED ORDER — SUCCINYLCHOLINE CHLORIDE 200 MG/10ML IV SOSY
PREFILLED_SYRINGE | INTRAVENOUS | Status: AC
  Filled 2017-10-26: qty 10

## 2017-10-26 MED ORDER — MIDAZOLAM HCL 2 MG/2ML IJ SOLN
INTRAMUSCULAR | Status: DC | PRN
  Administered 2017-10-26: 2 mg via INTRAVENOUS

## 2017-10-26 MED ORDER — CEFAZOLIN SODIUM-DEXTROSE 2-4 GM/100ML-% IV SOLN
INTRAVENOUS | Status: AC
  Filled 2017-10-26: qty 100

## 2017-10-26 MED ORDER — LIDOCAINE HCL 4 % MT SOLN
OROMUCOSAL | Status: DC | PRN
  Administered 2017-10-26: 3 mL via TOPICAL

## 2017-10-26 MED ORDER — EPHEDRINE SULFATE-NACL 50-0.9 MG/10ML-% IV SOSY
PREFILLED_SYRINGE | INTRAVENOUS | Status: DC | PRN
  Administered 2017-10-26 (×2): 10 mg via INTRAVENOUS

## 2017-10-26 MED ORDER — DEXAMETHASONE SODIUM PHOSPHATE 10 MG/ML IJ SOLN
INTRAMUSCULAR | Status: DC | PRN
  Administered 2017-10-26: 10 mg via INTRAVENOUS

## 2017-10-26 MED ORDER — PROMETHAZINE HCL 25 MG/ML IJ SOLN
6.2500 mg | INTRAMUSCULAR | Status: DC | PRN
  Filled 2017-10-26: qty 1

## 2017-10-26 MED ORDER — SODIUM CHLORIDE 0.9 % IJ SOLN
INTRAMUSCULAR | Status: DC | PRN
  Administered 2017-10-26: 13:00:00

## 2017-10-26 MED ORDER — BUPIVACAINE HCL (PF) 0.25 % IJ SOLN
INTRAMUSCULAR | Status: DC | PRN
  Administered 2017-10-26: 30 mL

## 2017-10-26 MED ORDER — ONDANSETRON HCL 4 MG/2ML IJ SOLN
INTRAMUSCULAR | Status: DC | PRN
  Administered 2017-10-26: 4 mg via INTRAVENOUS

## 2017-10-26 MED ORDER — ROCURONIUM BROMIDE 10 MG/ML (PF) SYRINGE
PREFILLED_SYRINGE | INTRAVENOUS | Status: DC | PRN
  Administered 2017-10-26: 5 mg via INTRAVENOUS

## 2017-10-26 MED ORDER — FENTANYL CITRATE (PF) 100 MCG/2ML IJ SOLN
INTRAMUSCULAR | Status: DC | PRN
  Administered 2017-10-26 (×2): 50 ug via INTRAVENOUS

## 2017-10-26 MED ORDER — SUCCINYLCHOLINE CHLORIDE 200 MG/10ML IV SOSY
PREFILLED_SYRINGE | INTRAVENOUS | Status: DC | PRN
  Administered 2017-10-26: 120 mg via INTRAVENOUS

## 2017-10-26 MED ORDER — PHENYLEPHRINE 40 MCG/ML (10ML) SYRINGE FOR IV PUSH (FOR BLOOD PRESSURE SUPPORT)
PREFILLED_SYRINGE | INTRAVENOUS | Status: DC | PRN
  Administered 2017-10-26 (×2): 80 ug via INTRAVENOUS

## 2017-10-26 MED ORDER — FENTANYL CITRATE (PF) 100 MCG/2ML IJ SOLN
INTRAMUSCULAR | Status: AC
  Filled 2017-10-26: qty 2

## 2017-10-26 MED ORDER — PROPOFOL 10 MG/ML IV BOLUS
INTRAVENOUS | Status: DC | PRN
  Administered 2017-10-26: 200 mg via INTRAVENOUS

## 2017-10-26 MED ORDER — PHENYLEPHRINE 40 MCG/ML (10ML) SYRINGE FOR IV PUSH (FOR BLOOD PRESSURE SUPPORT)
PREFILLED_SYRINGE | INTRAVENOUS | Status: AC
  Filled 2017-10-26: qty 10

## 2017-10-26 MED ORDER — ROCURONIUM BROMIDE 50 MG/5ML IV SOSY
PREFILLED_SYRINGE | INTRAVENOUS | Status: AC
  Filled 2017-10-26: qty 5

## 2017-10-26 MED ORDER — ARTIFICIAL TEARS OPHTHALMIC OINT
TOPICAL_OINTMENT | OPHTHALMIC | Status: AC
  Filled 2017-10-26: qty 3.5

## 2017-10-26 MED ORDER — CHLORHEXIDINE GLUCONATE 4 % EX LIQD
60.0000 mL | Freq: Once | CUTANEOUS | Status: DC
  Filled 2017-10-26: qty 118

## 2017-10-26 MED ORDER — LACTATED RINGERS IV SOLN
INTRAVENOUS | Status: DC
  Administered 2017-10-26 (×2): via INTRAVENOUS
  Filled 2017-10-26: qty 1000

## 2017-10-26 MED ORDER — LIDOCAINE 2% (20 MG/ML) 5 ML SYRINGE
INTRAMUSCULAR | Status: DC | PRN
  Administered 2017-10-26: 100 mg via INTRAVENOUS

## 2017-10-26 MED ORDER — BACLOFEN 10 MG PO TABS: 10.0000 mg | ORAL_TABLET | Freq: Three times a day (TID) | ORAL | 1 refills | Status: DC | PRN

## 2017-10-26 MED ORDER — FENTANYL CITRATE (PF) 100 MCG/2ML IJ SOLN
25.0000 ug | INTRAMUSCULAR | Status: DC | PRN
  Filled 2017-10-26: qty 1

## 2017-10-26 MED ORDER — MIDAZOLAM HCL 2 MG/2ML IJ SOLN
INTRAMUSCULAR | Status: AC
  Filled 2017-10-26: qty 2

## 2017-10-26 MED ORDER — ONDANSETRON HCL 4 MG/2ML IJ SOLN
INTRAMUSCULAR | Status: AC
  Filled 2017-10-26: qty 2

## 2017-10-26 MED ORDER — CEPHALEXIN 500 MG PO CAPS: 500.0000 mg | ORAL_CAPSULE | Freq: Three times a day (TID) | ORAL | 0 refills | Status: DC

## 2017-10-26 MED ORDER — PROPOFOL 10 MG/ML IV BOLUS
INTRAVENOUS | Status: AC
  Filled 2017-10-26: qty 40

## 2017-10-26 MED ORDER — BUPIVACAINE-EPINEPHRINE (PF) 0.5% -1:200000 IJ SOLN
INTRAMUSCULAR | Status: DC | PRN
  Administered 2017-10-26: 30 mL via PERINEURAL

## 2017-10-26 MED ORDER — EPHEDRINE 5 MG/ML INJ
INTRAVENOUS | Status: AC
  Filled 2017-10-26: qty 10

## 2017-10-26 MED ORDER — DEXAMETHASONE SODIUM PHOSPHATE 10 MG/ML IJ SOLN
INTRAMUSCULAR | Status: AC
  Filled 2017-10-26: qty 1

## 2017-10-26 MED ORDER — CEFAZOLIN SODIUM-DEXTROSE 2-4 GM/100ML-% IV SOLN
2.0000 g | INTRAVENOUS | Status: AC
  Administered 2017-10-26: 2 g via INTRAVENOUS
  Filled 2017-10-26: qty 100

## 2017-10-26 MED ORDER — METOCLOPRAMIDE HCL 5 MG/ML IJ SOLN
INTRAMUSCULAR | Status: DC | PRN
  Administered 2017-10-26: 10 mg via INTRAVENOUS

## 2017-10-26 MED ORDER — METOCLOPRAMIDE HCL 5 MG/ML IJ SOLN
INTRAMUSCULAR | Status: AC
  Filled 2017-10-26: qty 2

## 2017-10-26 MED FILL — BACLOFEN 10 MG TABS: 10 | 16 days supply | Qty: 50 | Fill #0

## 2017-10-26 MED FILL — ONDANSETRON HCL 4 MG TABLET: 4 | 6 days supply | Qty: 20 | Fill #0

## 2017-10-26 MED FILL — oxyCODONE HCL 5 MG TABS: 5 | 6 days supply | Qty: 40 | Fill #0

## 2017-10-26 MED FILL — CEPHALEXIN 500 MG CAPSULE: 500 | 4 days supply | Qty: 12 | Fill #0

## 2017-10-26 SURGICAL SUPPLY — 76 items
ANCH SUT SWLK 19.1X4.75 (Anchor) ×2 IMPLANT
ANCHOR SUT BIO SW 4.75X19.1 (Anchor) ×4 IMPLANT
BLADE CUDA GRT WHITE 3.5 (BLADE) ×3 IMPLANT
BLADE CUTTER GATOR 3.5 (BLADE) IMPLANT
BLADE GREAT WHITE 4.2 (BLADE) ×2 IMPLANT
BLADE GREAT WHITE 4.2MM (BLADE) ×1
BLADE SURG 11 STRL SS (BLADE) ×3 IMPLANT
BLADE SURG 15 STRL LF DISP TIS (BLADE) ×1 IMPLANT
BLADE SURG 15 STRL SS (BLADE) ×3
BUR 3.5 LG SPHERICAL (BURR) IMPLANT
BUR OVAL 6.0 (BURR) ×3 IMPLANT
BURR 3.5 LG SPHERICAL (BURR)
BURR 3.5MM LG SPHERICAL (BURR)
CANNULA 5.75X7 CRYSTAL CLEAR (CANNULA) ×3 IMPLANT
CANNULA 5.75X71 LONG (CANNULA) IMPLANT
CANNULA TWIST IN 8.25X7CM (CANNULA) ×2 IMPLANT
DRAPE LG THREE QUARTER DISP (DRAPES) ×3 IMPLANT
DRAPE ORTHO SPLIT 77X108 STRL (DRAPES) ×6
DRAPE POUCH INSTRU U-SHP 10X18 (DRAPES) ×3 IMPLANT
DRAPE STERI 35X30 U-POUCH (DRAPES) ×3 IMPLANT
DRAPE SURG 17X23 STRL (DRAPES) ×3 IMPLANT
DRAPE SURG ORHT 6 SPLT 77X108 (DRAPES) ×2 IMPLANT
DRAPE U-SHAPE 47X51 STRL (DRAPES) ×3 IMPLANT
DURAPREP 26ML APPLICATOR (WOUND CARE) ×3 IMPLANT
ELECT MENISCUS 165MM 90D (ELECTRODE) IMPLANT
ELECT REM PT RETURN 9FT ADLT (ELECTROSURGICAL) ×3
ELECTRODE REM PT RTRN 9FT ADLT (ELECTROSURGICAL) ×1 IMPLANT
FIBERSTICK 2 (SUTURE) IMPLANT
GAUZE SPONGE 4X4 12PLY STRL (GAUZE/BANDAGES/DRESSINGS) ×3 IMPLANT
GAUZE SPONGE 4X4 12PLY STRL LF (GAUZE/BANDAGES/DRESSINGS) ×2 IMPLANT
GAUZE XEROFORM 1X8 LF (GAUZE/BANDAGES/DRESSINGS) ×3 IMPLANT
GLOVE BIO SURGEON STRL SZ7.5 (GLOVE) ×3 IMPLANT
GLOVE BIO SURGEON STRL SZ8 (GLOVE) ×3 IMPLANT
GLOVE INDICATOR 8.0 STRL GRN (GLOVE) ×6 IMPLANT
GOWN STRL REUS W/TWL LRG LVL3 (GOWN DISPOSABLE) IMPLANT
GOWN STRL REUS W/TWL XL LVL3 (GOWN DISPOSABLE) ×6 IMPLANT
KIT RM TURNOVER CYSTO AR (KITS) ×3 IMPLANT
LASSO SUT 90 DEGREE (SUTURE) IMPLANT
MANIFOLD NEPTUNE II (INSTRUMENTS) IMPLANT
NDL 1/2 CIR CATGUT .05X1.09 (NEEDLE) IMPLANT
NDL SCORPION MULTI FIRE (NEEDLE) IMPLANT
NEEDLE 1/2 CIR CATGUT .05X1.09 (NEEDLE) IMPLANT
NEEDLE HYPO 22GX1.5 SAFETY (NEEDLE) ×3 IMPLANT
NEEDLE SCORPION MULTI FIRE (NEEDLE) ×3 IMPLANT
NS IRRIG 500ML POUR BTL (IV SOLUTION) IMPLANT
PACK ARTHROSCOPY DSU (CUSTOM PROCEDURE TRAY) ×3 IMPLANT
PACK BASIN DAY SURGERY FS (CUSTOM PROCEDURE TRAY) ×3 IMPLANT
PAD ABD 8X10 STRL (GAUZE/BANDAGES/DRESSINGS) ×3 IMPLANT
PAD ARMBOARD 7.5X6 YLW CONV (MISCELLANEOUS) IMPLANT
PENCIL BUTTON HOLSTER BLD 10FT (ELECTRODE) IMPLANT
PROBE BIPOLAR ATHRO 135MM 90D (MISCELLANEOUS) ×3 IMPLANT
SET ARTHROSCOPY TUBING (MISCELLANEOUS) ×3
SET ARTHROSCOPY TUBING PVC (MISCELLANEOUS) ×1 IMPLANT
SLEEVE ARM SUSPENSION SYSTEM (MISCELLANEOUS) ×3 IMPLANT
SLING S3 LATERAL DISP (MISCELLANEOUS) IMPLANT
SLING ULTRA II L (ORTHOPEDIC SUPPLIES) ×3 IMPLANT
SLING ULTRA II S (ORTHOPEDIC SUPPLIES) ×3 IMPLANT
SPONGE LAP 4X18 X RAY DECT (DISPOSABLE) IMPLANT
SUT 2 FIBERLOOP 20 STRT BLUE (SUTURE)
SUT ETHILON 3 0 PS 1 (SUTURE) ×3 IMPLANT
SUT FIBERWIRE #2 38 T-5 BLUE (SUTURE)
SUT LASSO 45 DEGREE LEFT (SUTURE) IMPLANT
SUT LASSO 45D RIGHT (SUTURE) IMPLANT
SUT PDS AB 0 CT1 36 (SUTURE) IMPLANT
SUT TIGER TAPE 7 IN WHITE (SUTURE) IMPLANT
SUT VIC AB 0 CT1 36 (SUTURE) IMPLANT
SUT VIC AB 2-0 CT1 27 (SUTURE)
SUT VIC AB 2-0 CT1 TAPERPNT 27 (SUTURE) IMPLANT
SUTURE 2 FIBERLOOP 20 STRT BLU (SUTURE) IMPLANT
SUTURE FIBERWR #2 38 T-5 BLUE (SUTURE) IMPLANT
SYR CONTROL 10ML LL (SYRINGE) ×3 IMPLANT
TAPE CLOTH SURG 4X10 WHT LF (GAUZE/BANDAGES/DRESSINGS) ×2 IMPLANT
TOWEL OR 17X24 6PK STRL BLUE (TOWEL DISPOSABLE) ×6 IMPLANT
TUBE CONNECTING 12'X1/4 (SUCTIONS) ×1
TUBE CONNECTING 12X1/4 (SUCTIONS) ×2 IMPLANT
WATER STERILE IRR 500ML POUR (IV SOLUTION) ×3 IMPLANT

## 2017-10-26 NOTE — Transfer of Care (Signed)
  Last Vitals:  Vitals:   10/26/17 1203 10/26/17 1536  BP: (!) 149/91 (!) (P) 154/89  Pulse: 79   Resp: 20   Temp: 36.9 C (P) 36.8 C  SpO2: 99%     Last Pain:  Vitals:   10/26/17 1203  TempSrc: Oral      Patients Stated Pain Goal: 8 (10/26/17 1230)  Immediate Anesthesia Transfer of Care Note  Patient: Dan Sanchez  Procedure(s) Performed: Procedure(s) (LRB): RIGHT SHOULDER ARTHROSCOPY WITH ROTATOR CUFF REPAIR AND DEBRIDEMENT (Right)  Patient Location: PACU  Anesthesia Type: General  Level of Consciousness: awake, alert  and oriented  Airway & Oxygen Therapy: Patient Spontanous Breathing and Patient connected to nasal cannula oxygen  Post-op Assessment: Report given to PACU RN and Post -op Vital signs reviewed and stable  Post vital signs: Reviewed and stable  Complications: No apparent anesthesia complications

## 2017-10-26 NOTE — Anesthesia Preprocedure Evaluation (Addendum)
Anesthesia Evaluation  Patient identified by MRN, date of birth, ID band Patient awake    Reviewed: Allergy & Precautions, NPO status , Patient's Chart, lab work & pertinent test results  Airway Mallampati: II  TM Distance: >3 FB Neck ROM: Full    Dental  (+) Dental Advisory Given   Pulmonary neg pulmonary ROS,    Pulmonary exam normal breath sounds clear to auscultation       Cardiovascular + CAD  Normal cardiovascular exam Rhythm:Regular Rate:Normal  HLD  EKG - SR, Right axis, Anterolateral TWI   Neuro/Psych Insomnianegative neurological ROS     GI/Hepatic Neg liver ROS, GERD  Medicated and Controlled,  Endo/Other  negative endocrine ROS  Renal/GU negative Renal ROS     Musculoskeletal negative musculoskeletal ROS (+) Arthritis ,   Abdominal   Peds  Hematology negative hematology ROS (+)   Anesthesia Other Findings HSV-1  Reproductive/Obstetrics                            Anesthesia Physical Anesthesia Plan  ASA: II  Anesthesia Plan: General   Post-op Pain Management:  Regional for Post-op pain   Induction: Intravenous  PONV Risk Score and Plan: 3 and Treatment may vary due to age or medical condition, Ondansetron, Dexamethasone and Midazolam  Airway Management Planned: Oral ETT  Additional Equipment: None  Intra-op Plan:   Post-operative Plan: Extubation in OR  Informed Consent: I have reviewed the patients History and Physical, chart, labs and discussed the procedure including the risks, benefits and alternatives for the proposed anesthesia with the patient or authorized representative who has indicated his/her understanding and acceptance.   Dental advisory given  Plan Discussed with: CRNA  Anesthesia Plan Comments:         Anesthesia Quick Evaluation

## 2017-10-26 NOTE — H&P (Signed)
Dan Sanchez is an 61 y.o. male.   Chief Complaint: right shoulder pain TDD:UKGURKY presents with joint discomfort that had been persistent for several weeks now. History of right shoulder arthroscopy in the past.  Despite conservative treatments, the patients discomfort has not improved. Imaging was obtained. Other conservative and surgical treatments were discussed in detail. Patient wishes to proceed with surgery as consented. Denies SOB, CP, or calf pain. No Fever, chills, or nausea/ vomiting.   Past Medical History:  Diagnosis Date  . Allergy   . CAD (coronary artery disease)    mild non-obstructive  . GERD (gastroesophageal reflux disease)   . Hyperlipidemia   . Sinusitis, bacterial   . Urinary incontinence     Past Surgical History:  Procedure Laterality Date  . COLONOSCOPY    . LEFT HEART CATHETERIZATION WITH CORONARY ANGIOGRAM N/A 08/13/2014   Procedure: LEFT HEART CATHETERIZATION WITH CORONARY ANGIOGRAM;  Surgeon: Burnell Blanks, MD;  Location: [facility] CATH LAB;  Service: Cardiovascular;  Laterality: N/A;  . SHOULDER SURGERY     Right shoulder     Family History  Problem Relation Age of Onset  . Heart disease Father        MI age 68   Social History:  reports that  has never smoked. he has never used smokeless tobacco. He reports that he drinks about 2.0 oz of alcohol per week. He reports that he does not use drugs.  Allergies: No Known Allergies  Medications Prior to Admission  Medication Sig Dispense Refill  . Ascorbic Acid (VITAMIN C) 1000 MG tablet Take 1,000 mg by mouth daily.    Marland Kitchen aspirin EC 325 MG tablet Take 325 mg by mouth at bedtime.    . cetirizine (ZYRTEC) 10 MG tablet Take 10 mg by mouth daily.    . fluticasone (FLONASE) 50 MCG/ACT nasal spray PLACE 2 SPRAYS INTO BOTH NOSTRILS DAILY. (Patient taking differently: PLACE 2 SPRAYS INTO BOTH NOSTRILS DAILY. prn) 16 g 6  . Multiple Vitamin (MULTIVITAMIN WITH MINERALS) TABS tablet Take 1 tablet by mouth  daily.    . Omega-3 Fatty Acids (FISH OIL) 1200 MG CAPS Take 1,200 mg by mouth 2 (two) times daily.    Marland Kitchen omeprazole (PRILOSEC) 20 MG capsule TAKE ONE CAPSULE BY MOUTH DAILY 90 capsule 3  . rosuvastatin (CRESTOR) 20 MG tablet Take 1 tablet (20 mg total) by mouth daily. 90 tablet 3  . EPINEPHrine (EPIPEN 2-PAK) 0.3 mg/0.3 mL IJ SOAJ injection Inject 0.3 mLs (0.3 mg total) into the muscle once. (Patient not taking: Reported on 09/18/2017) 4 Device 1  . valACYclovir (VALTREX) 1000 MG tablet Take 1,000 mg by mouth 2 (two) times daily. At the sight of a cold sore      Results for orders placed or performed during the hospital encounter of 10/26/17 (from the past 48 hour(s))  I-STAT 4, (NA,K, GLUC, HGB,HCT)     Status: None   Collection Time: 10/26/17 12:59 PM  Result Value Ref Range   Sodium 141 135 - 145 mmol/L   Potassium 3.9 3.5 - 5.1 mmol/L   Glucose, Bld 96 65 - 99 mg/dL   HCT 39.0 39.0 - 52.0 %   Hemoglobin 13.3 13.0 - 17.0 g/dL   No results found.  Review of Systems  Constitutional: Negative.   HENT: Negative.   Eyes: Negative.   Respiratory: Negative.   Cardiovascular: Negative.   Gastrointestinal: Negative.   Genitourinary: Negative.   Musculoskeletal: Positive for joint pain.  Skin: Negative.  Neurological: Negative.   Endo/Heme/Allergies: Negative.   Psychiatric/Behavioral: Negative.     Blood pressure (!) 149/91, pulse 79, temperature 98.4 F (36.9 C), temperature source Oral, resp. rate 20, height 5' 10.5" (1.791 m), weight 90 kg (198 lb 8 oz), SpO2 99 %. Physical Exam  Constitutional: He is oriented to person, place, and time. He appears well-developed.  HENT:  Head: Normocephalic.  Eyes: EOM are normal.  Neck: Normal range of motion.  Cardiovascular: Normal rate.  Respiratory: Effort normal.  GI: Soft.  Genitourinary:  Genitourinary Comments: Deferred  Musculoskeletal:  Right shoulder weakness and limited rom. RUE grossly n/v intact.  Neurological: He is  alert and oriented to person, place, and time.  Skin: Skin is warm and dry.  Psychiatric: His behavior is normal.     Assessment/Plan Right shoulder RCT: Right shoulder RCR as consented D/c home today with family F/u in office Follow instructions  Lajean Manes, PA-C 10/26/2017, 1:33 PM

## 2017-10-26 NOTE — Op Note (Signed)
[phone]

## 2017-10-26 NOTE — Discharge Instructions (Signed)

## 2017-10-26 NOTE — Anesthesia Procedure Notes (Signed)
Procedure Name: Intubation Date/Time: 10/26/2017 2:12 PM Performed by: Myrtie Soman, MD Pre-anesthesia Checklist: Patient identified, Emergency Drugs available, Suction available and Patient being monitored Patient Re-evaluated:Patient Re-evaluated prior to induction Oxygen Delivery Method: Circle system utilized Preoxygenation: Pre-oxygenation with 100% oxygen Induction Type: IV induction and Cricoid Pressure applied Ventilation: Mask ventilation without difficulty Laryngoscope Size: Mac and 4 Grade View: Grade II Tube type: Oral Tube size: 7.5 mm Number of attempts: 1 Airway Equipment and Method: Stylet and LTA kit utilized Placement Confirmation: ETT inserted through vocal cords under direct vision,  positive ETCO2 and breath sounds checked- equal and bilateral Secured at: 23 cm Tube secured with: Tape Dental Injury: Teeth and Oropharynx as per pre-operative assessment

## 2017-10-26 NOTE — Anesthesia Procedure Notes (Addendum)
Anesthesia Regional Block: Interscalene brachial plexus block   Pre-Anesthetic Checklist: ,, timeout performed, Correct Patient, Correct Site, Correct Laterality, Correct Procedure, Correct Position, site marked, Risks and benefits discussed,  Surgical consent,  Pre-op evaluation,  At surgeon's request and post-op pain management  Laterality: Right  Prep: chloraprep       Needles:  Injection technique: Single-shot  Needle Type: Echogenic Needle     Needle Length: 9cm  Needle Gauge: 21     Additional Needles:   Narrative:  Start time: 10/26/2017 1:20 PM End time: 10/26/2017 1:24 PM Injection made incrementally with aspirations every 5 mL.  Performed by: Personally  Anesthesiologist: Audry Pili, MD  Additional Notes: No pain on injection. No increased resistance to injection. Injection made in 5cc increments. Good needle visualization. Patient tolerated the procedure well.

## 2017-10-27 NOTE — Anesthesia Postprocedure Evaluation (Signed)
Anesthesia Post Note  Patient: [name]  Procedure(s) Performed: RIGHT SHOULDER ARTHROSCOPY WITH ROTATOR CUFF REPAIR AND DEBRIDEMENT (Right )     Patient location during evaluation: PACU Anesthesia Type: General Level of consciousness: awake and alert Pain management: pain level controlled Vital Signs Assessment: post-procedure vital signs reviewed and stable Respiratory status: spontaneous breathing, nonlabored ventilation, respiratory function stable and patient connected to nasal cannula oxygen Cardiovascular status: blood pressure returned to baseline and stable Postop Assessment: no apparent nausea or vomiting Anesthetic complications: no    Last Vitals:  Vitals:   10/26/17 1615 10/26/17 1705  BP: (!) 151/84 (!) 157/88  Pulse: 79 82  Resp: 17 17  Temp:  37.1 C  SpO2: 97% 100%    Last Pain:  Vitals:   10/26/17 1203  TempSrc: Oral                 Felton Buczynski S

## 2017-10-27 NOTE — Op Note (Signed)
Dan Sanchez, Dan Sanchez NO.:  [id number]  MEDICAL RECORD NO.:  [id number]  LOCATION:                                 FACILITY:  PHYSICIAN:  Cynda Familia, M.D. DATE OF BIRTH:  DATE OF PROCEDURE:  10/26/2017 DATE OF DISCHARGE:                              OPERATIVE REPORT   PREOPERATIVE DIAGNOSIS:  Right shoulder rotator cuff tear, full thickness.  POSTOPERATIVE DIAGNOSES: 1. Right shoulder rotator cuff tear, full thickness. 2. Rotator cuff tendinopathy.  PROCEDURE:  Right shoulder arthroscopic rotator cuff repair.  SURGEON:  Cynda Familia, M.D.  ASSISTANT:  Wyatt Portela, PA-C.  ANESTHESIA:  Interscalene block with general.  ESTIMATED BLOOD LOSS:  Minimal.  DRAINS:  None.  COMPLICATIONS:  None.  DISPOSITION:  PACU, stable.  OPERATIVE DETAILS:  The patient and family were counseled in the holding area.  Correct site was identified, marked, and signed appropriately. IV was started, sedation given, block was administered.  Taken to the operating room, placed in supine position under general anesthesia. Placed into a left lateral decubitus position, properly padded and bumped.  Right shoulder was examined, full range of motion and stable. Prepped with DuraPrep and draped in usual sterile fashion.  Utilized the [employer] shoulder holder, 30 degrees of abduction, 10 degrees of forward flexion, and 15 pounds of longitudinal traction.  Posterior portal was created, and the arthroscope was placed to the glenohumeral joint, immediately found was an intact articular cartilage, biceps, labrum, and stability, but there was a full-thickness rotator cuff tear present.  Subacromial region, there was thick subacromial bursa.  Lateral portal was established.  Neurovascular structures were protected including the axillary nerve, and subacromial subdeltoid bursectomy was performed. Rotator cuff was found to have a U-shaped tear at the  supraspinatus, going back to the infraspinous.  Head was mobilized nicely.  It did appear to be rather compromised, just basically intrinsic cuff tendinopathy.  There was no extrinsic compression, it had already been decompressed with subacromial decompression and distal clavicle resection in the past.  There were no compromising features of the cuff in the subacromial space.  The greater tuberosity repaired down to bleeding bone, removing of the soft tissue and then down to the bleeding bone with a burr.  To begin, a small puncture was made superiorly and [employer] Bio[employer] anchor placed in greater tuberosity.  Four limbs of suture placed, FiberWire and FiberTape.  Arm was then abducted.  This was placed in another SwiveLock.  Also, to help remove the posterior sutures, placed an anterior anchor.  In addition, I noticed that there was some pull- through and we grasped the best tissue as possible.  Arm was abducted with no SwiveLock, punched and screwed in position.  At this point in time, there were no dog ears, tissues lying nicely down on bleeding bone without excessive tension.  Irrigated and arthroscopic equipment was removed.  Portals closed with 4-0 nylon.  Another 10 mL of Sensorcaine placed on the skin in the subacromial region of the shoulder.  Sterile dressing applied.  Shoulder has shoulder abduction sling.  Awakened and he was taken from the operating room to the PACU in stable condition.  He will be stabilized in PACU and discharged home.  To help with the patient positioning, prepping, draping, technical and surgical assistance throughout the entire case, wound closure, application of dressing and sling, Mr. Wyatt Portela, PA-C's assistance was needed.    ______________________________ Cynda Familia, M.D.   ______________________________ Cynda Familia, M.D.    RAC/MEDQ  D:  10/26/2017  T:  10/26/2017  Job:  [id number]

## 2017-10-30 ENCOUNTER — Encounter (HOSPITAL_BASED_OUTPATIENT_CLINIC_OR_DEPARTMENT_OTHER): Payer: Self-pay | Admitting: Specialist

## 2017-11-03 DIAGNOSIS — S46011D Strain of muscle(s) and tendon(s) of the rotator cuff of right shoulder, subsequent encounter: Secondary | ICD-10-CM | POA: Diagnosis not present

## 2017-11-22 MED FILL — FLUTICASONE PROP 50 MCG SPR: 50 | 30 days supply | Qty: 16 | Fill #4

## 2017-11-24 MED FILL — traMADol HCL 50 MG TABS: 50 | 7 days supply | Qty: 40 | Fill #0

## 2017-12-15 MED FILL — OMEPRAZOLE 20 MG CAP: 20 | 90 days supply | Qty: 90 | Fill #1

## 2017-12-15 MED FILL — ROSUVASTATIN CALCIUM 20 MG: 20 | 90 days supply | Qty: 90 | Fill #2

## 2018-01-05 IMAGING — DX DG LUMBAR SPINE COMPLETE 4+V
5 series · 5 of 5 positions shown · non-contrast
Comparison: 07/22/2014.

CLINICAL DATA: Back pain.  No known injury.

EXAM:
LUMBAR SPINE - COMPLETE 4+ VIEW

[l-spine ap]
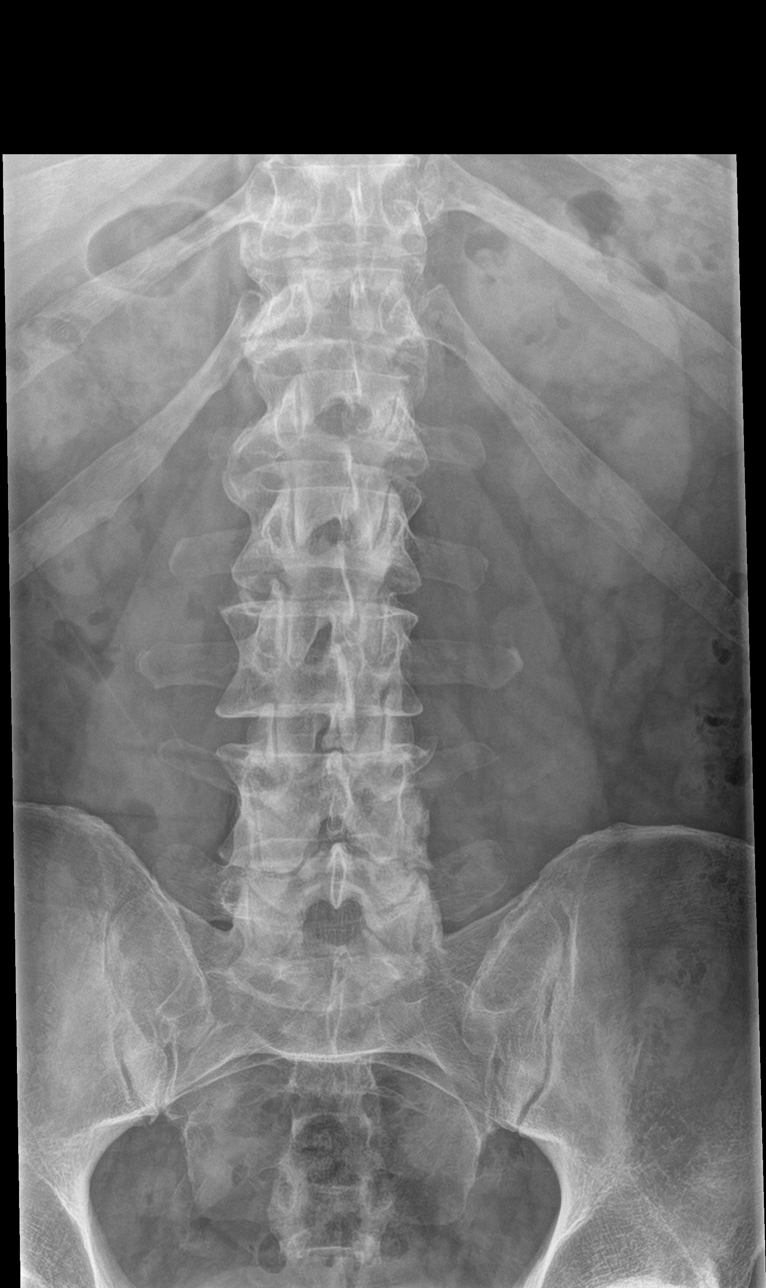

[l-spine obl (1 of 2)]
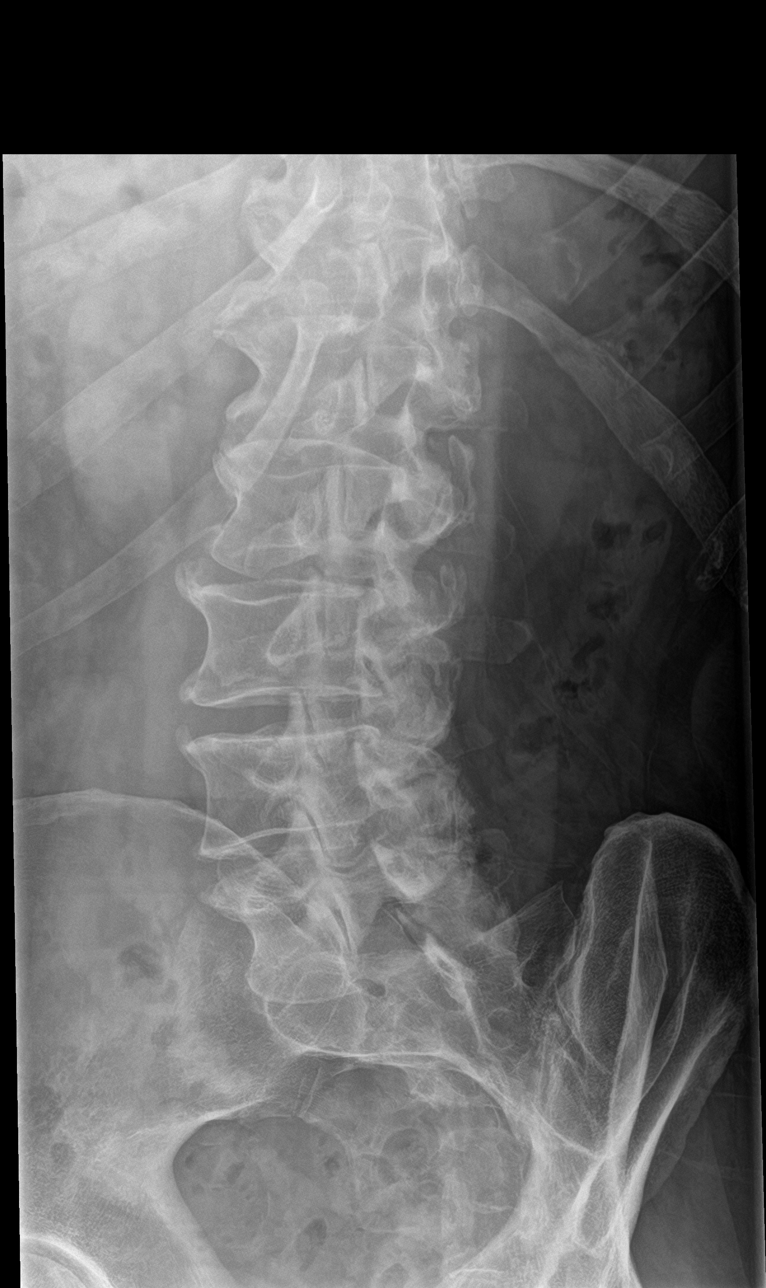

[l-spine obl (2 of 2)]
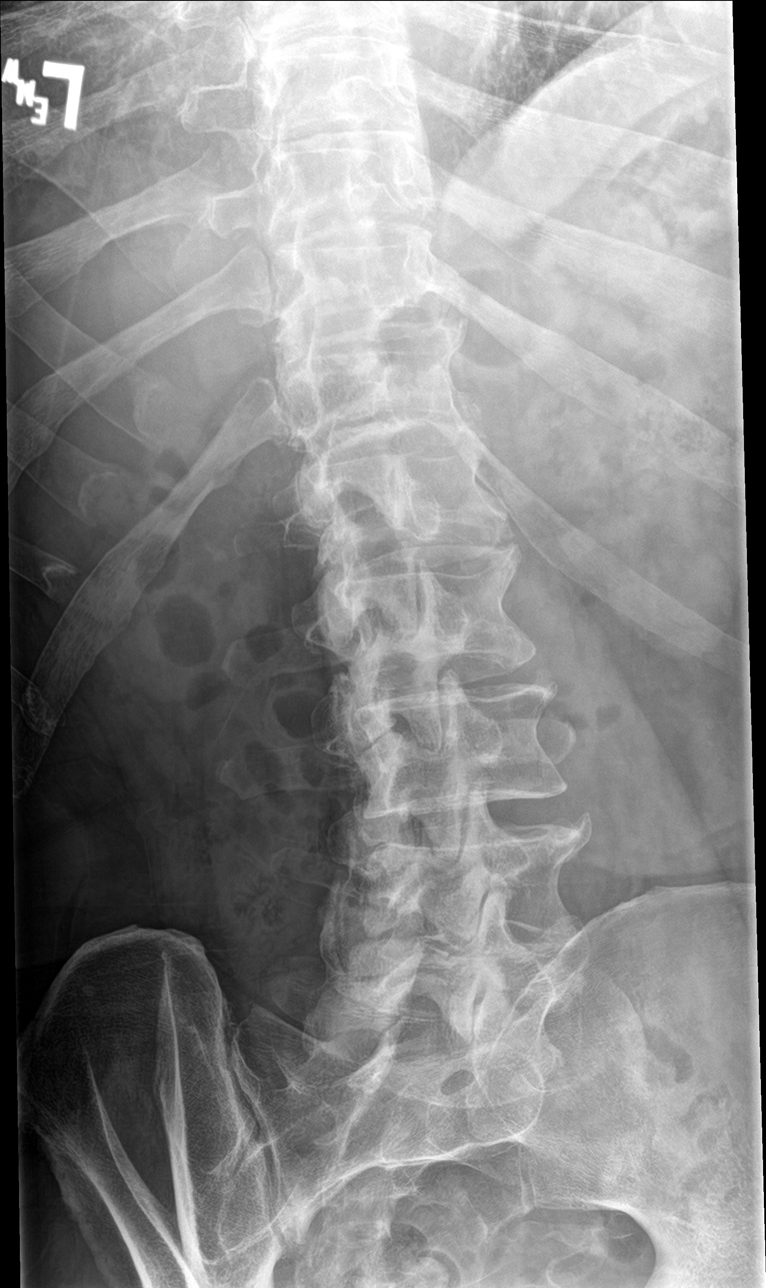

[l-spine lat]
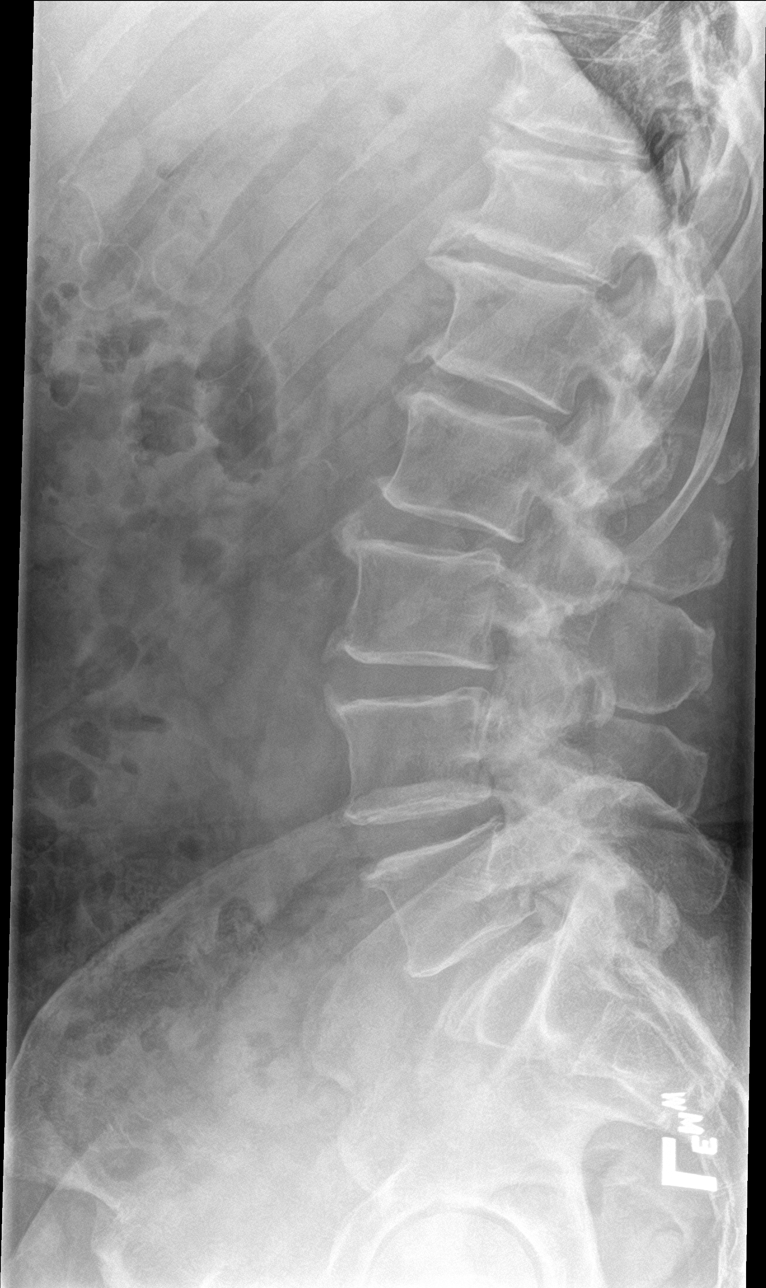

[l-spine spot]
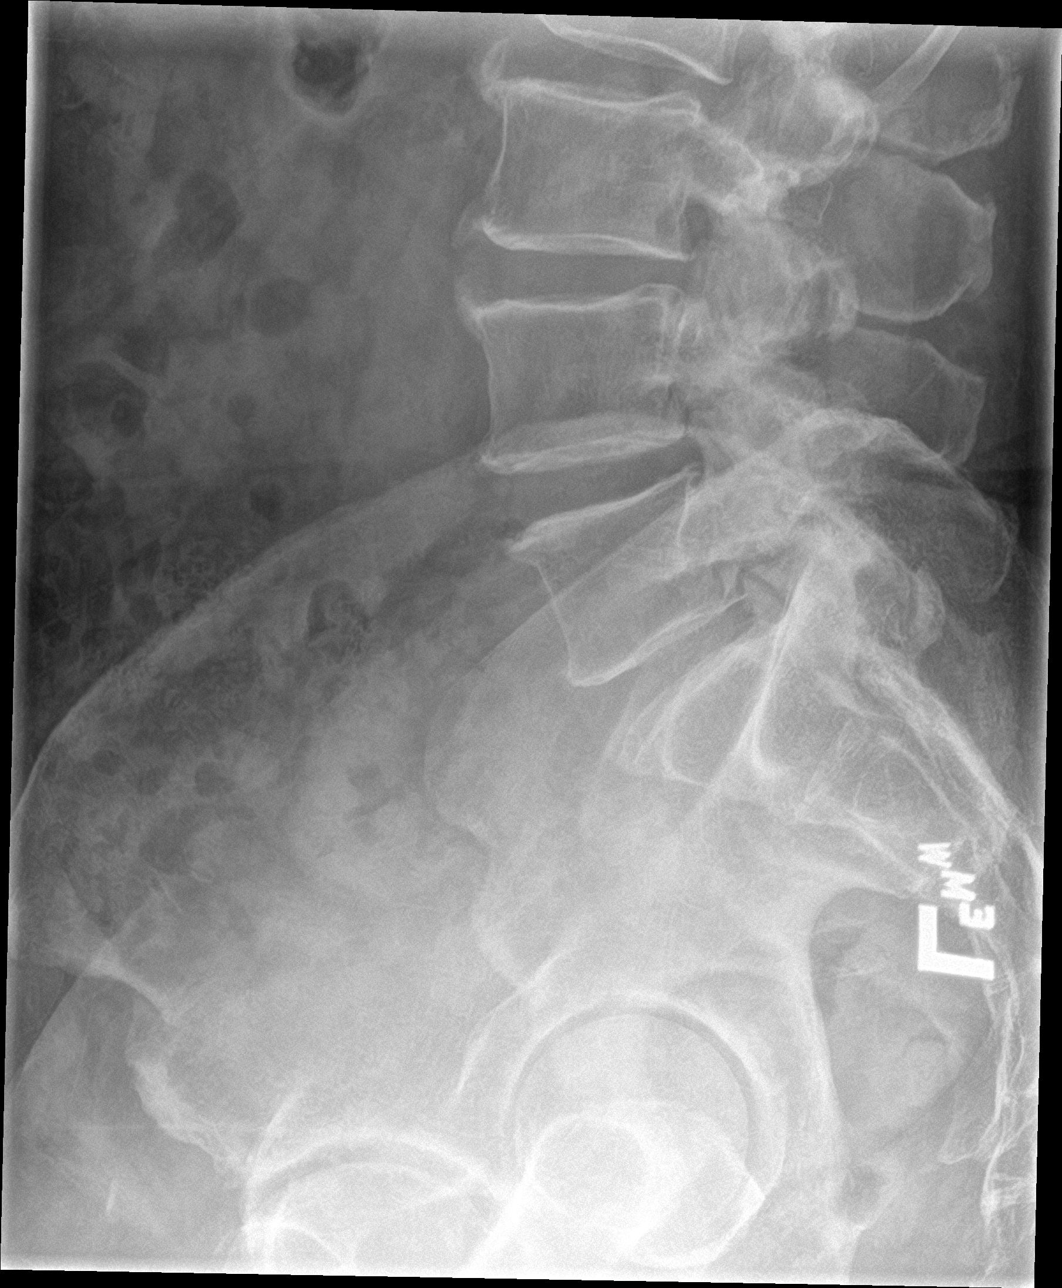

[5 of 5 positions shown; findings below may reference images not displayed]

FINDINGS: Paraspinal soft tissues are normal. Minimal anterior wedging T12,
unchanged. No acute abnormality P Diffuse multilevel degenerative
change. Normal alignment.
IMPRESSION: Diffuse multilevel degenerative change.  No acute abnormality.

## 2018-02-09 DIAGNOSIS — Z85828 Personal history of other malignant neoplasm of skin: Secondary | ICD-10-CM | POA: Diagnosis not present

## 2018-02-09 DIAGNOSIS — L578 Other skin changes due to chronic exposure to nonionizing radiation: Secondary | ICD-10-CM | POA: Diagnosis not present

## 2018-02-09 DIAGNOSIS — L570 Actinic keratosis: Secondary | ICD-10-CM | POA: Diagnosis not present

## 2018-02-09 DIAGNOSIS — L821 Other seborrheic keratosis: Secondary | ICD-10-CM | POA: Diagnosis not present

## 2018-02-23 MED FILL — FLUOROURACIL 5% CREAM: 5 | 14 days supply | Qty: 40 | Fill #0

## 2018-02-23 MED FILL — FLUTICASONE PROP 50 MCG SPR: 50 | 30 days supply | Qty: 16 | Fill #5

## 2018-03-12 MED FILL — ROSUVASTATIN CALCIUM 20 MG: 20 | 90 days supply | Qty: 90 | Fill #3

## 2018-03-30 MED FILL — OMEPRAZOLE 20 MG CAP: 20 | 90 days supply | Qty: 90 | Fill #2

## 2018-04-09 DIAGNOSIS — M25511 Pain in right shoulder: Secondary | ICD-10-CM | POA: Diagnosis not present

## 2018-04-10 ENCOUNTER — Other Ambulatory Visit: Payer: Self-pay | Admitting: Family Medicine

## 2018-04-10 MED FILL — FLUTICASONE PROP 50 MCG SPR: 50 | 30 days supply | Qty: 16 | Fill #0

## 2018-04-12 MED FILL — AMOXICILLIN 875 MG TABS: 875 | 10 days supply | Qty: 20 | Fill #0

## 2018-05-06 ENCOUNTER — Other Ambulatory Visit: Payer: Self-pay

## 2018-05-06 ENCOUNTER — Emergency Department (INDEPENDENT_AMBULATORY_CARE_PROVIDER_SITE_OTHER)
Admission: EM | Admit: 2018-05-06 | Discharge: 2018-05-06 | Disposition: A | Discharge status: Home / Self Care | Payer: 59 | Source: Home / Self Care | Attending: Family Medicine | Admitting: Family Medicine

## 2018-05-06 ENCOUNTER — Encounter: Payer: Self-pay | Admitting: Emergency Medicine

## 2018-05-06 DIAGNOSIS — L02818 Cutaneous abscess of other sites: Secondary | ICD-10-CM

## 2018-05-06 DIAGNOSIS — H02843 Edema of right eye, unspecified eyelid: Secondary | ICD-10-CM

## 2018-05-06 MED ORDER — PREDNISONE 20 MG PO TABS: ORAL_TABLET | ORAL | 0 refills | Status: DC

## 2018-05-06 MED ORDER — DOXYCYCLINE HYCLATE 100 MG PO CAPS: 100.0000 mg | ORAL_CAPSULE | Freq: Two times a day (BID) | ORAL | 0 refills | Status: DC

## 2018-05-06 NOTE — ED Provider Notes (Signed)
[facility]    CSN: [id number] Arrival date & time: 05/06/18  1103     History   Chief Complaint Chief Complaint  Patient presents with  . Eye Problem    right    HPI MATEUSZ Sanchez is a 62 y.o. male.   HPI  Patient presents today with a complaint of a small "bump" type mass localized on right lateral side of upper eye lid. This mass has been present for 4 days. It is tender to touch and hardened. Affected are doesn't itch. He became alarmed today, when he noticed that the redness and swelling has now extended into his right upper eye lid. He has only attempted relief with warm compresses. He is unaware of contact with a insect. Denies any pain or burning inside his eye or affecting the right orbit. No crusting or draining from right eye since problem occurred. Past Medical History:  Diagnosis Date  . Allergy   . CAD (coronary artery disease)    mild non-obstructive  . GERD (gastroesophageal reflux disease)   . Hyperlipidemia   . S/P right rotator cuff repair   . Sinusitis, bacterial   . Urinary incontinence     Patient Active Problem List   Diagnosis Date Noted  . S/P right rotator cuff repair 10/26/2017  . Low back pain 09/26/2016  . Contact dermatitis 08/27/2015  . HSV-1 (herpes simplex virus 1) infection 08/27/2015  . Sinusitis, acute maxillary 05/13/2015  . Muscle strain of left upper arm 03/13/2015  . Recurrent maxillary sinusitis 12/12/2014  . Bee sting allergy 09/10/2014  . Chest pain 08/11/2014  . Elevated BP 08/10/2014  . Allergic rhinitis 08/10/2014  . Substernal chest pain 07/24/2014  . Muscle spasm of back 07/24/2014  . Allergic conjunctivitis 02/20/2014  . Insomnia 02/03/2014  . GERD (gastroesophageal reflux disease) 02/03/2014  . General medical examination 03/16/2012  . Hyperlipidemia 12/24/2007  . ERECTILE DYSFUNCTION 12/24/2007    Past Surgical History:  Procedure Laterality Date  . COLONOSCOPY    . LEFT HEART CATHETERIZATION  WITH CORONARY ANGIOGRAM N/A 08/13/2014   Procedure: LEFT HEART CATHETERIZATION WITH CORONARY ANGIOGRAM;  Surgeon: Burnell Blanks, MD;  Location: [facility] CATH LAB;  Service: Cardiovascular;  Laterality: N/A;  . SHOULDER ARTHROSCOPY WITH ROTATOR CUFF REPAIR Right 10/26/2017   Procedure: RIGHT SHOULDER ARTHROSCOPY WITH ROTATOR CUFF REPAIR AND DEBRIDEMENT;  Surgeon: Sydnee Cabal, MD;  Location: [facility];  Service: Orthopedics;  Laterality: Right;  . SHOULDER SURGERY     Right shoulder       Home Medications    Prior to Admission medications   Medication Sig Start Date End Date Taking? Authorizing Provider  Ascorbic Acid (VITAMIN C) 1000 MG tablet Take 1,000 mg by mouth daily.    [provider]  aspirin EC 325 MG tablet Take 325 mg by mouth at bedtime.    [provider]  baclofen (LIORESAL) 10 MG tablet Take 1 tablet (10 mg total) by mouth 3 (three) times daily as needed for muscle spasms. 10/26/17 10/26/18  Stilwell, Sueanne Margarita, PA-C  cephALEXin (KEFLEX) 500 MG capsule Take 1 capsule (500 mg total) by mouth 3 (three) times daily. 10/26/17   Stilwell, Bryson L, PA-C  cetirizine (ZYRTEC) 10 MG tablet Take 10 mg by mouth daily.    [provider]  doxycycline (VIBRAMYCIN) 100 MG capsule Take 1 capsule (100 mg total) by mouth 2 (two) times daily. 05/06/18   Scot Jun, FNP  EPINEPHrine (EPIPEN 2-PAK) 0.3 mg/0.3  mL IJ SOAJ injection Inject 0.3 mLs (0.3 mg total) into the muscle once. Patient not taking: Reported on 09/18/2017 09/10/14   Midge Minium, MD  fluticasone (FLONASE) 50 MCG/ACT nasal spray PLACE 2 SPRAYS INTO BOTH NOSTRILS DAILY. prn 04/10/18   Midge Minium, MD  Multiple Vitamin (MULTIVITAMIN WITH MINERALS) TABS tablet Take 1 tablet by mouth daily.    [provider]  Omega-3 Fatty Acids (FISH OIL) 1200 MG CAPS Take 1,200 mg by mouth 2 (two) times daily.    [provider]  omeprazole (PRILOSEC) 20 MG  capsule TAKE ONE CAPSULE BY MOUTH DAILY 09/08/17   Midge Minium, MD  ondansetron (ZOFRAN) 4 MG tablet Take 1 tablet (4 mg total) by mouth every 8 (eight) hours as needed for nausea or vomiting. 10/26/17 10/26/18  Stilwell, Bryson L, PA-C  oxyCODONE (ROXICODONE) 5 MG immediate release tablet Take 1 tablet (5 mg total) by mouth every 4 (four) hours as needed. 10/26/17 10/26/18  Stilwell, Sueanne Margarita, PA-C  predniSONE (DELTASONE) 20 MG tablet Take 1 tablet by mouth daily with breakfast x 3 days for treatment of eye inflammation. 05/06/18   Scot Jun, FNP  rosuvastatin (CRESTOR) 20 MG tablet Take 1 tablet (20 mg total) by mouth daily. 06/08/17   Burnell Blanks, MD  valACYclovir (VALTREX) 1000 MG tablet Take 1,000 mg by mouth 2 (two) times daily. At the sight of a cold sore    [provider]    Family History Family History  Problem Relation Age of Onset  . Heart disease Father        MI age 22    Social History Social History   Tobacco Use  . Smoking status: Never Smoker  . Smokeless tobacco: Never Used  Substance Use Topics  . Alcohol use: Yes    Alcohol/week: 2.0 oz    Types: 4 Standard drinks or equivalent per week    Comment: occ  . Drug use: No     Allergies   Patient has no known allergies.   Review of Systems Review of Systems Pertinent negatives included in HPI   Physical Exam No data found.  Updated Vital Signs BP (!) 162/88 (BP Location: Right Arm)   Pulse 66   Temp 98.3 F (36.8 C) (Oral)   Resp 18   SpO2 98%   Visual Acuity Right Eye Distance: 20/25 Left Eye Distance: 20/20 Bilateral Distance: without correction  Right Eye Near:   Left Eye Near:    Bilateral Near:     Physical Exam  Constitutional: He is oriented to person, place, and time. He appears well-developed and well-nourished.  HENT:  Head: Normocephalic and atraumatic.  Eyes: Pupils are equal, round, and reactive to light. Conjunctivae and EOM are normal.  Right eye exhibits no discharge. Left eye exhibits no discharge. No scleral icterus.    Eyelid is mobile, non-tender with palpation. Conjunctiva non-erythematous. No crusting noted or drainage noted.   Cardiovascular: Normal rate and regular rhythm.  Pulmonary/Chest: Effort normal and breath sounds normal.  Neurological: He is alert and oriented to person, place, and time.  Skin: Rash noted. Rash is nodular. There is erythema.  Right lateral face proximal to right upper eyelid  Nodular lesion present with surrounding erythema    UC Treatments / Results  Labs (all labs ordered are listed, but only abnormal results are displayed) Labs Reviewed - No data to display  EKG None  Radiology No results found.  Procedures Procedures (including critical  care time)  Medications Ordered in UC Medications - No data to display  Initial Impression / Assessment and Plan / UC Course  I have reviewed the triage vital signs and the nursing notes.  Pertinent labs & imaging results that were available during my care of the patient were reviewed by me and considered in my medical decision making (see chart for details).  Patient presents today with a lesion which appears to be consist with a suspected insect bite which has developed into a skin abscess. Edema and erythema which were initially localized to the right upper lateral region proximal to eye lid as extended into upper right eye lid drawing suspicion for early signs of cellulitis. Patient has remained afebrile and without chills. Visual acuity unchanged. Will treat empirically with a broad spectrum antibiotic and prednisone (short course) for eyelid inflammation and swelling. Strict follow-up precautions recommended if symptoms worsen. Patient educated on the early signs of periorbital cellulitis. See orders:  Final Clinical Impressions(s) / UC Diagnoses   Final diagnoses:  Swelling of eyelid, right  Cutaneous abscess of other site      Discharge Instructions     As discussed, if eye swelling worsens or visual acuity changes, follow-up immediately at the closest emergency department.     ED Prescriptions    Medication Sig Dispense Auth. Provider   doxycycline (VIBRAMYCIN) 100 MG capsule Take 1 capsule (100 mg total) by mouth 2 (two) times daily. 20 capsule Scot Jun, FNP   predniSONE (DELTASONE) 20 MG tablet Take 1 tablet by mouth daily with breakfast x 3 days for treatment of eye inflammation. 3 tablet Scot Jun, FNP     Controlled Substance Prescriptions [place] Controlled Substance Registry consulted? Not Applicable   Scot Jun, FNP 05/06/18 1352

## 2018-05-06 NOTE — ED Triage Notes (Signed)
Patient reports redness and edema at outer aspect right eyebrow area for past 3 days; no known insect bite, no itching or pain.

## 2018-05-06 NOTE — Discharge Instructions (Addendum)
As discussed, if eye swelling worsens or visual acuity changes, follow-up immediately at the closest emergency department.

## 2018-05-21 DIAGNOSIS — M25511 Pain in right shoulder: Secondary | ICD-10-CM | POA: Diagnosis not present

## 2018-06-12 ENCOUNTER — Other Ambulatory Visit: Payer: Self-pay | Admitting: Cardiovascular Disease

## 2018-06-12 MED FILL — ROSUVASTATIN CALCIUM 20 MG: 20 | 90 days supply | Qty: 90 | Fill #0

## 2018-06-12 MED FILL — OMEPRAZOLE 20 MG CPDR: 20 | 90 days supply | Qty: 90 | Fill #3

## 2018-06-28 ENCOUNTER — Other Ambulatory Visit: Payer: Self-pay

## 2018-06-28 ENCOUNTER — Encounter: Payer: Self-pay | Admitting: Physician Assistant

## 2018-06-28 ENCOUNTER — Ambulatory Visit: Payer: BLUE CROSS/BLUE SHIELD | Admitting: Physician Assistant

## 2018-06-28 VITALS — BP 150/90 | HR 75 | Temp 98.3°F | Resp 16 | Ht 70.5 in | Wt 202.6 lb

## 2018-06-28 DIAGNOSIS — J019 Acute sinusitis, unspecified: Secondary | ICD-10-CM | POA: Diagnosis not present

## 2018-06-28 DIAGNOSIS — R030 Elevated blood-pressure reading, without diagnosis of hypertension: Secondary | ICD-10-CM | POA: Diagnosis not present

## 2018-06-28 DIAGNOSIS — B9689 Other specified bacterial agents as the cause of diseases classified elsewhere: Secondary | ICD-10-CM | POA: Diagnosis not present

## 2018-06-28 MED ORDER — FLUTICASONE PROPIONATE 50 MCG/ACT NA SUSP: 2.0000 | Freq: Every day | NASAL | 6 refills | Status: AC

## 2018-06-28 MED ORDER — AZELASTINE HCL 0.1 % NA SOLN: 2.0000 | Freq: Two times a day (BID) | NASAL | 1 refills | Status: DC

## 2018-06-28 MED ORDER — AMOXICILLIN-POT CLAVULANATE 875-125 MG PO TABS
1.0000 | ORAL_TABLET | Freq: Two times a day (BID) | ORAL | 0 refills | Status: DC
Start: 2018-06-28 — End: 2018-07-12

## 2018-06-28 MED ORDER — EPINEPHRINE 0.3 MG/0.3ML IJ SOAJ: 0.3000 mg | Freq: Once | INTRAMUSCULAR | 0 refills | Status: AC

## 2018-06-28 MED ORDER — BENZONATATE 100 MG PO CAPS: 100.0000 mg | ORAL_CAPSULE | Freq: Two times a day (BID) | ORAL | 0 refills | Status: DC | PRN

## 2018-06-28 MED FILL — EPINEPHRINE 0.3 MG AUTO-INJ: 0.3 | 2 days supply | Qty: 2 | Fill #0

## 2018-06-28 MED FILL — FLUTICASONE PROP 50 MCG SPR: 50 | 30 days supply | Qty: 16 | Fill #0

## 2018-06-28 MED FILL — AZELASTINE HCL 137 MCG/SPRA: 137 | 30 days supply | Qty: 30 | Fill #0

## 2018-06-28 MED FILL — AMOX-CLAV 875-125 MG TABLET: 875-125 | 7 days supply | Qty: 14 | Fill #0

## 2018-06-28 NOTE — Patient Instructions (Addendum)
Please take antibiotic as directed.  Increase fluid intake.  Use Saline nasal spray.  Take a daily multivitamin. Add on the Astelin spray to the Flonase.  Place a humidifier in the bedroom.  Please call or return clinic if symptoms are not improving.  Please avoid any decongestants (nasal or oral) as they can raise BP  -- this includes the Afrin.  Follow recommendations below. Follow-up in 10-14 days for reassessment of BP. If still elevated after resolution of infection and with dietary changes we may have to start a medication  Sinusitis Sinusitis is redness, soreness, and swelling (inflammation) of the paranasal sinuses. Paranasal sinuses are air pockets within the bones of your face (beneath the eyes, the middle of the forehead, or above the eyes). In healthy paranasal sinuses, mucus is able to drain out, and air is able to circulate through them by way of your nose. However, when your paranasal sinuses are inflamed, mucus and air can become trapped. This can allow bacteria and other germs to grow and cause infection. Sinusitis can develop quickly and last only a short time (acute) or continue over a long period (chronic). Sinusitis that lasts for more than 12 weeks is considered chronic.  CAUSES  Causes of sinusitis include:  Allergies.  Structural abnormalities, such as displacement of the cartilage that separates your nostrils (deviated septum), which can decrease the air flow through your nose and sinuses and affect sinus drainage.  Functional abnormalities, such as when the small hairs (cilia) that line your sinuses and help remove mucus do not work properly or are not present. SYMPTOMS  Symptoms of acute and chronic sinusitis are the same. The primary symptoms are pain and pressure around the affected sinuses. Other symptoms include:  Upper toothache.  Earache.  Headache.  Bad breath.  Decreased sense of smell and taste.  A cough, which worsens when you are lying  flat.  Fatigue.  Fever.  Thick drainage from your nose, which often is green and may contain pus (purulent).  Swelling and warmth over the affected sinuses. DIAGNOSIS  Your caregiver will perform a physical exam. During the exam, your caregiver may:  Look in your nose for signs of abnormal growths in your nostrils (nasal polyps).  Tap over the affected sinus to check for signs of infection.  View the inside of your sinuses (endoscopy) with a special imaging device with a light attached (endoscope), which is inserted into your sinuses. If your caregiver suspects that you have chronic sinusitis, one or more of the following tests may be recommended:  Allergy tests.  Nasal culture A sample of mucus is taken from your nose and sent to a lab and screened for bacteria.  Nasal cytology A sample of mucus is taken from your nose and examined by your caregiver to determine if your sinusitis is related to an allergy. TREATMENT  Most cases of acute sinusitis are related to a viral infection and will resolve on their own within 10 days. Sometimes medicines are prescribed to help relieve symptoms (pain medicine, decongestants, nasal steroid sprays, or saline sprays).  However, for sinusitis related to a bacterial infection, your caregiver will prescribe antibiotic medicines. These are medicines that will help kill the bacteria causing the infection.  Rarely, sinusitis is caused by a fungal infection. In theses cases, your caregiver will prescribe antifungal medicine. For some cases of chronic sinusitis, surgery is needed. Generally, these are cases in which sinusitis recurs more than 3 times per year, despite other treatments. HOME CARE  INSTRUCTIONS   Drink plenty of water. Water helps thin the mucus so your sinuses can drain more easily.  Use a humidifier.  Inhale steam 3 to 4 times a day (for example, sit in the bathroom with the shower running).  Apply a warm, moist washcloth to your face 3  to 4 times a day, or as directed by your caregiver.  Use saline nasal sprays to help moisten and clean your sinuses.  Take over-the-counter or prescription medicines for pain, discomfort, or fever only as directed by your caregiver. SEEK IMMEDIATE MEDICAL CARE IF:  You have increasing pain or severe headaches.  You have nausea, vomiting, or drowsiness.  You have swelling around your face.  You have vision problems.  You have a stiff neck.  You have difficulty breathing. MAKE SURE YOU:   Understand these instructions.  Will watch your condition.  Will get help right away if you are not doing well or get worse. Document Released: 11/14/2005 Document Revised: 02/06/2012 Document Reviewed: 11/29/2011 [facility] Patient Information 2014 [place], [place].   DASH Eating Plan DASH stands for "Dietary Approaches to Stop Hypertension." The DASH eating plan is a healthy eating plan that has been shown to reduce high blood pressure (hypertension). It may also reduce your risk for type 2 diabetes, heart disease, and stroke. The DASH eating plan may also help with weight loss. What are tips for following this plan? General guidelines  Avoid eating more than 2,300 mg (milligrams) of salt (sodium) a day. If you have hypertension, you may need to reduce your sodium intake to 1,500 mg a day.  Limit alcohol intake to no more than 1 drink a day for nonpregnant women and 2 drinks a day for men. One drink equals 12 oz of beer, 5 oz of wine, or 1 oz of hard liquor.  Work with your health care provider to maintain a healthy body weight or to lose weight. Ask what an ideal weight is for you.  Get at least 30 minutes of exercise that causes your heart to beat faster (aerobic exercise) most days of the week. Activities may include walking, swimming, or biking.  Work with your health care provider or diet and nutrition specialist (dietitian) to adjust your eating plan to your individual calorie  needs. Reading food labels  Check food labels for the amount of sodium per serving. Choose foods with less than 5 percent of the Daily Value of sodium. Generally, foods with less than 300 mg of sodium per serving fit into this eating plan.  To find whole grains, look for the word "whole" as the first word in the ingredient list. Shopping  Buy products labeled as "low-sodium" or "no salt added."  Buy fresh foods. Avoid canned foods and premade or frozen meals. Cooking  Avoid adding salt when cooking. Use salt-free seasonings or herbs instead of table salt or sea salt. Check with your health care provider or pharmacist before using salt substitutes.  Do not fry foods. Cook foods using healthy methods such as baking, boiling, grilling, and broiling instead.  Cook with heart-healthy oils, such as olive, canola, soybean, or sunflower oil. Meal planning   Eat a balanced diet that includes: ? 5 or more servings of fruits and vegetables each day. At each meal, try to fill half of your plate with fruits and vegetables. ? Up to 6-8 servings of whole grains each day. ? Less than 6 oz of lean meat, poultry, or fish each day. A 3-oz serving of meat  is about the same size as a deck of cards. One egg equals 1 oz. ? 2 servings of low-fat dairy each day. ? A serving of nuts, seeds, or beans 5 times each week. ? Heart-healthy fats. Healthy fats called Omega-3 fatty acids are found in foods such as flaxseeds and coldwater fish, like sardines, salmon, and mackerel.  Limit how much you eat of the following: ? Canned or prepackaged foods. ? Food that is high in trans fat, such as fried foods. ? Food that is high in saturated fat, such as fatty meat. ? Sweets, desserts, sugary drinks, and other foods with added sugar. ? Full-fat dairy products.  Do not salt foods before eating.  Try to eat at least 2 vegetarian meals each week.  Eat more home-cooked food and less restaurant, buffet, and fast  food.  When eating at a restaurant, ask that your food be prepared with less salt or no salt, if possible. What foods are recommended? The items listed may not be a complete list. Talk with your dietitian about what dietary choices are best for you. Grains Whole-grain or whole-wheat bread. Whole-grain or whole-wheat pasta. Brown rice. [place]. Bulgur. Whole-grain and low-sodium cereals. Pita bread. Low-fat, low-sodium crackers. Whole-wheat flour tortillas. Vegetables Fresh or frozen vegetables (raw, steamed, roasted, or grilled). Low-sodium or reduced-sodium tomato and vegetable juice. Low-sodium or reduced-sodium tomato sauce and tomato paste. Low-sodium or reduced-sodium canned vegetables. Fruits All fresh, dried, or frozen fruit. Canned fruit in natural juice (without added sugar). Meat and other protein foods Skinless chicken or [place]. Ground chicken or [place]. Pork with fat trimmed off. Fish and seafood. Egg whites. Dried beans, peas, or lentils. Unsalted nuts, nut butters, and seeds. Unsalted canned beans. Lean cuts of beef with fat trimmed off. Low-sodium, lean deli meat. Dairy Low-fat (1%) or fat-free (skim) milk. Fat-free, low-fat, or reduced-fat cheeses. Nonfat, low-sodium ricotta or cottage cheese. Low-fat or nonfat yogurt. Low-fat, low-sodium cheese. Fats and oils Soft margarine without trans fats. Vegetable oil. Low-fat, reduced-fat, or light mayonnaise and salad dressings (reduced-sodium). Canola, safflower, olive, soybean, and sunflower oils. Avocado. Seasoning and other foods Herbs. Spices. Seasoning mixes without salt. Unsalted popcorn and pretzels. Fat-free sweets. What foods are not recommended? The items listed may not be a complete list. Talk with your dietitian about what dietary choices are best for you. Grains Baked goods made with fat, such as croissants, muffins, or some breads. Dry pasta or rice meal packs. Vegetables Creamed or fried vegetables. Vegetables  in a cheese sauce. Regular canned vegetables (not low-sodium or reduced-sodium). Regular canned tomato sauce and paste (not low-sodium or reduced-sodium). Regular tomato and vegetable juice (not low-sodium or reduced-sodium). Angie Fava. Olives. Fruits Canned fruit in a light or heavy syrup. Fried fruit. Fruit in cream or butter sauce. Meat and other protein foods Fatty cuts of meat. Ribs. Fried meat. Berniece Salines. Sausage. Bologna and other processed lunch meats. Salami. Fatback. Hotdogs. Bratwurst. Salted nuts and seeds. Canned beans with added salt. Canned or smoked fish. Whole eggs or egg yolks. Chicken or [place] with skin. Dairy Whole or 2% milk, cream, and half-and-half. Whole or full-fat cream cheese. Whole-fat or sweetened yogurt. Full-fat cheese. Nondairy creamers. Whipped toppings. Processed cheese and cheese spreads. Fats and oils Butter. Stick margarine. Lard. Shortening. Ghee. Bacon fat. Tropical oils, such as coconut, palm kernel, or palm oil. Seasoning and other foods Salted popcorn and pretzels. Onion salt, garlic salt, seasoned salt, table salt, and sea salt. Worcestershire sauce. Tartar sauce. Barbecue sauce. Teriyaki sauce.  Soy sauce, including reduced-sodium. Steak sauce. Canned and packaged gravies. Fish sauce. Oyster sauce. Cocktail sauce. Horseradish that you find on the shelf. Ketchup. Mustard. Meat flavorings and tenderizers. Bouillon cubes. Hot sauce and Tabasco sauce. Premade or packaged marinades. Premade or packaged taco seasonings. Relishes. Regular salad dressings. Where to find more information:  National Heart, Lung, and [place]: https://wilson-eaton.com/  American Heart Association: www.heart.org Summary  The DASH eating plan is a healthy eating plan that has been shown to reduce high blood pressure (hypertension). It may also reduce your risk for type 2 diabetes, heart disease, and stroke.  With the DASH eating plan, you should limit salt (sodium) intake to 2,300 mg a  day. If you have hypertension, you may need to reduce your sodium intake to 1,500 mg a day.  When on the DASH eating plan, aim to eat more fresh fruits and vegetables, whole grains, lean proteins, low-fat dairy, and heart-healthy fats.  Work with your health care provider or diet and nutrition specialist (dietitian) to adjust your eating plan to your individual calorie needs. This information is not intended to replace advice given to you by your health care provider. Make sure you discuss any questions you have with your health care provider. Document Released: 11/03/2011 Document Revised: 11/07/2016 Document Reviewed: 11/07/2016 Elsevier Interactive Patient Education  [employer].

## 2018-06-28 NOTE — Progress Notes (Signed)
Patient presents to clinic today c/o 3 weeks of sinus pressure, sinus pain, bilateral ear pressure. Denies fever, chills, dizziness. Denies chest congestion, cough, SOB. Has been taking OTC Sudafed and Afrin nasal spray.    Past Medical History:  Diagnosis Date  . Allergy   . CAD (coronary artery disease)    mild non-obstructive  . GERD (gastroesophageal reflux disease)   . Hyperlipidemia   . S/P right rotator cuff repair   . Sinusitis, bacterial   . Urinary incontinence     Current Outpatient Medications on File Prior to Visit  Medication Sig Dispense Refill  . Ascorbic Acid (VITAMIN C) 1000 MG tablet Take 1,000 mg by mouth daily.    Marland Kitchen aspirin EC 325 MG tablet Take 325 mg by mouth at bedtime.    . cetirizine (ZYRTEC) 10 MG tablet Take 10 mg by mouth daily.    Marland Kitchen EPINEPHrine (EPIPEN 2-PAK) 0.3 mg/0.3 mL IJ SOAJ injection Inject 0.3 mLs (0.3 mg total) into the muscle once. 4 Device 1  . fexofenadine (ALLEGRA ALLERGY) 180 MG tablet [facility]ra All[facility]    . fluorouracil (EFUDEX) 5 % cream fluorouracil 5 % topical cream    . fluticasone (FLONASE) 50 MCG/ACT nasal spray PLACE 2 SPRAYS INTO BOTH NOSTRILS DAILY. prn 16 g 0  . Multiple Vitamin (MULTIVITAMIN WITH MINERALS) TABS tablet Take 1 tablet by mouth daily.    . Omega-3 Fatty Acids (FISH OIL) 1200 MG CAPS Take 1,200 mg by mouth 2 (two) times daily.    Marland Kitchen omeprazole (PRILOSEC) 20 MG capsule TAKE ONE CAPSULE BY MOUTH DAILY 90 capsule 3  . rosuvastatin (CRESTOR) 20 MG tablet TAKE 1 TABLET (20 MG TOTAL) BY MOUTH DAILY. 90 tablet 0  . valACYclovir (VALTREX) 1000 MG tablet Take 1,000 mg by mouth 2 (two) times daily. At the sight of a cold sore    . baclofen (LIORESAL) 10 MG tablet Take 1 tablet (10 mg total) by mouth 3 (three) times daily as needed for muscle spasms. (Patient not taking: Reported on 06/28/2018) 50 tablet 1   No current facility-administered medications on file prior to visit.     No Known Allergies  Family History  Problem  Relation Age of Onset  . Heart disease Father        MI age 79    Social History   Socioeconomic History  . Marital status: Married    Spouse name: Not on file  . Number of children: Not on file  . Years of education: Not on file  . Highest education level: Not on file  Occupational History  . Occupation: Nurse, adult: [employer]  Social Needs  . Financial resource strain: Not on file  . Food insecurity:    Worry: Not on file    Inability: Not on file  . Transportation needs:    Medical: Not on file    Non-medical: Not on file  Tobacco Use  . Smoking status: Never Smoker  . Smokeless tobacco: Never Used  Substance and Sexual Activity  . Alcohol use: Yes    Alcohol/week: 2.4 oz    Types: 4 Standard drinks or equivalent per week    Comment: occ  . Drug use: No  . Sexual activity: Not on file  Lifestyle  . Physical activity:    Days per week: Not on file    Minutes per session: Not on file  . Stress: Not on file  Relationships  . Social connections:  Talks on phone: Not on file    Gets together: Not on file    Attends religious service: Not on file    Active member of club or organization: Not on file    Attends meetings of clubs or organizations: Not on file    Relationship status: Not on file  Other Topics Concern  . Not on file  Social History Narrative  . Not on file   Review of Systems - See HPI.  All other ROS are negative.  There were no vitals taken for this visit.  Physical Exam  Constitutional: He appears well-developed and well-nourished.  HENT:  Head: Normocephalic and atraumatic.  Right Ear: Tympanic membrane and external ear normal.  Left Ear: Tympanic membrane and external ear normal.  Nose: Mucosal edema and rhinorrhea present. Right sinus exhibits maxillary sinus tenderness and frontal sinus tenderness.  Mouth/Throat: Uvula is midline and oropharynx is clear and moist.  Eyes: Conjunctivae are normal.    Cardiovascular: Normal rate, regular rhythm, normal heart sounds and intact distal pulses.  Pulmonary/Chest: Effort normal.  Lymphadenopathy:    He has no cervical adenopathy.  Vitals reviewed.  Assessment/Plan: 1. Acute bacterial sinusitis Rx Augmentin.  Increase fluids.  Rest.  Saline nasal spray.  Probiotic.  Mucinex as directed.  Humidifier in bedroom. Will add on nasal astelin spray to his Flonase.  Call or return to clinic if symptoms are not improving.  - amoxicillin-clavulanate (AUGMENTIN) 875-125 MG tablet; Take 1 tablet by mouth 2 (two) times daily.  Dispense: 14 tablet; Refill: 0 - benzonatate (TESSALON) 100 MG capsule; Take 1 capsule (100 mg total) by mouth 2 (two) times daily as needed for cough.  Dispense: 20 capsule; Refill: 0  2. Elevated BP without diagnosis of hypertension Asymptomatic. Stop Sudafed and Afrin. Start DASH diet. Patient with recent elevated BP but on further review these were when he was having his rotator cuff surgery. Will have him start TLC and return in 2 weeks for reassessment. If still elevated will need to consider medication.    Leeanne Rio, PA-C

## 2018-07-12 ENCOUNTER — Telehealth: Payer: Self-pay | Admitting: Family Medicine

## 2018-07-12 DIAGNOSIS — J019 Acute sinusitis, unspecified: Principal | ICD-10-CM

## 2018-07-12 DIAGNOSIS — B9689 Other specified bacterial agents as the cause of diseases classified elsewhere: Secondary | ICD-10-CM

## 2018-07-12 MED ORDER — AMOXICILLIN-POT CLAVULANATE 875-125 MG PO TABS: 1.0000 | ORAL_TABLET | Freq: Two times a day (BID) | ORAL | 0 refills | Status: DC

## 2018-07-12 MED FILL — AMOX-CLAV 875-125 MG TABLET: 875-125 | 7 days supply | Qty: 14 | Fill #0

## 2018-07-12 NOTE — Telephone Encounter (Signed)
Last OV 06/28/2018, Next OV 09/24/18  Augmentin 875-125 mg last filled 06/28/18, # 14 with 0 refills

## 2018-07-12 NOTE — Telephone Encounter (Signed)
Copied from [place] [id number] [id number]. Topic: Quick Communication - See Telephone Encounter >> Jul 12, 2018  2:46 PM Bea Graff, NT wrote: CRM for notification. See Telephone encounter for: 07/12/18. Pt states he has completed his antibiotics and now his sinus infection is back wants to see if more can be called in? [place], [place] - [place] [phone] (Phone) [phone] (Fax)

## 2018-07-12 NOTE — Telephone Encounter (Signed)
Medication filled to pharmacy as requested.  Pt made aware.  

## 2018-07-12 NOTE — Telephone Encounter (Signed)
Dan Sanchez for another 7 days of augmentin as he only had 7 days to start with

## 2018-07-14 NOTE — Progress Notes (Addendum)
Chief Complaint  Patient presents with  . Follow-up    CAD    History of Present Illness: 62 yo male with history of CAD and hyperlipidemia here today for cardiac follow up. I met him in the cath lab in September 2015. He had an abnormal outpatient stress test arranged by primary care. During the GXT he had ST elevation in AVR, AVL but no chest pain with exercise. Cardiac cath 08/13/14 with mild disease in the LAD (20%) and RCA (20%). LV systolic function was normal. He has had no chest pain over the past 4 years.  He is here today for follow up. The patient denies any chest pain, dyspnea, palpitations, lower extremity edema, orthopnea, PND, dizziness, near syncope or syncope. BP has been elevated at home.   Primary Care Physician: Midge Minium, MD  Past Medical History:  Diagnosis Date  . Allergy   . CAD (coronary artery disease)    mild non-obstructive  . GERD (gastroesophageal reflux disease)   . Hyperlipidemia   . S/P right rotator cuff repair   . Sinusitis, bacterial   . Urinary incontinence     Past Surgical History:  Procedure Laterality Date  . COLONOSCOPY    . LEFT HEART CATHETERIZATION WITH CORONARY ANGIOGRAM N/A 08/13/2014   Procedure: LEFT HEART CATHETERIZATION WITH CORONARY ANGIOGRAM;  Surgeon: Burnell Blanks, MD;  Location: [facility] CATH LAB;  Service: Cardiovascular;  Laterality: N/A;  . SHOULDER ARTHROSCOPY WITH ROTATOR CUFF REPAIR Right 10/26/2017   Procedure: RIGHT SHOULDER ARTHROSCOPY WITH ROTATOR CUFF REPAIR AND DEBRIDEMENT;  Surgeon: Sydnee Cabal, MD;  Location: [place];  Service: Orthopedics;  Laterality: Right;  . SHOULDER SURGERY     Right shoulder     Current Outpatient Medications  Medication Sig Dispense Refill  . amoxicillin-clavulanate (AUGMENTIN) 875-125 MG tablet Take 1 tablet by mouth 2 (two) times daily. 14 tablet 0  . Ascorbic Acid (VITAMIN C) 1000 MG tablet Take 1,000 mg by mouth daily.    Marland Kitchen aspirin EC 325 MG  tablet Take 325 mg by mouth at bedtime.    Marland Kitchen azelastine (ASTELIN) 0.1 % nasal spray Place 2 sprays into both nostrils 2 (two) times daily. Use in each nostril as directed 30 mL 1  . baclofen (LIORESAL) 10 MG tablet Take 1 tablet (10 mg total) by mouth 3 (three) times daily as needed for muscle spasms. 50 tablet 1  . benzonatate (TESSALON) 100 MG capsule Take 1 capsule (100 mg total) by mouth 2 (two) times daily as needed for cough. 20 capsule 0  . cetirizine (ZYRTEC) 10 MG tablet Take 10 mg by mouth daily.    . fexofenadine (ALLEGRA ALLERGY) 180 MG tablet Allegra Allergy    . fluorouracil (EFUDEX) 5 % cream fluorouracil 5 % topical cream    . fluticasone (FLONASE) 50 MCG/ACT nasal spray Place 2 sprays into both nostrils daily. 16 g 6  . Multiple Vitamin (MULTIVITAMIN WITH MINERALS) TABS tablet Take 1 tablet by mouth daily.    . Omega-3 Fatty Acids (FISH OIL) 1200 MG CAPS Take 1,200 mg by mouth 2 (two) times daily.    Marland Kitchen omeprazole (PRILOSEC) 20 MG capsule TAKE ONE CAPSULE BY MOUTH DAILY 90 capsule 3  . rosuvastatin (CRESTOR) 20 MG tablet TAKE 1 TABLET (20 MG TOTAL) BY MOUTH DAILY. 90 tablet 0  . valACYclovir (VALTREX) 1000 MG tablet Take 1,000 mg by mouth 2 (two) times daily. At the sight of a cold sore    . amLODipine (  NORVASC) 5 MG tablet Take 1 tablet (5 mg total) by mouth daily. 30 tablet 11   No current facility-administered medications for this visit.     No Known Allergies  Social History   Socioeconomic History  . Marital status: Married    Spouse name: Not on file  . Number of children: Not on file  . Years of education: Not on file  . Highest education level: Not on file  Occupational History  . Occupation: Nurse, adult: [employer]  Social Needs  . Financial resource strain: Not on file  . Food insecurity:    Worry: Not on file    Inability: Not on file  . Transportation needs:    Medical: Not on file    Non-medical: Not on file  Tobacco Use    . Smoking status: Never Smoker  . Smokeless tobacco: Never Used  Substance and Sexual Activity  . Alcohol use: Yes    Alcohol/week: 4.0 standard drinks    Types: 4 Standard drinks or equivalent per week    Comment: occ  . Drug use: No  . Sexual activity: Not on file  Lifestyle  . Physical activity:    Days per week: Not on file    Minutes per session: Not on file  . Stress: Not on file  Relationships  . Social connections:    Talks on phone: Not on file    Gets together: Not on file    Attends religious service: Not on file    Active member of club or organization: Not on file    Attends meetings of clubs or organizations: Not on file    Relationship status: Not on file  . Intimate partner violence:    Fear of current or ex partner: Not on file    Emotionally abused: Not on file    Physically abused: Not on file    Forced sexual activity: Not on file  Other Topics Concern  . Not on file  Social History Narrative  . Not on file    Family History  Problem Relation Age of Onset  . Heart disease Father        MI age 63    Review of Systems:  As stated in the HPI and otherwise negative.   BP (!) 144/82   Pulse 67   Ht 5' 10.5" (1.791 m)   Wt 203 lb (92.1 kg)   SpO2 98%   BMI 28.72 kg/m   Physical Examination:  General: Well developed, well nourished, NAD  HEENT: OP clear, mucus membranes moist  SKIN: warm, dry. No rashes. Neuro: No focal deficits  Musculoskeletal: Muscle strength 5/5 all ext  Psychiatric: Mood and affect normal  Neck: No JVD, no carotid bruits, no thyromegaly, no lymphadenopathy.  Lungs:Clear bilaterally, no wheezes, rhonci, crackles Cardiovascular: Regular rate and rhythm. No murmurs, gallops or rubs. Abdomen:Soft. Bowel sounds present. Non-tender.  Extremities: No lower extremity edema. Pulses are 2 + in the bilateral DP/PT.  Cardiac cath 08/13/14: Hemodynamic Findings:  Central aortic pressure: 137/82  Left ventricular pressure:  144/8/14  Angiographic Findings:  Left main: No obstructive disease.  Left Anterior Descending Artery: Large caliber vessel that courses to the apex. There is a 20% stenosis in the mid vessel. There are mild luminal irregularities in the distal vessel. There are several small caliber diagonal branches.  Circumflex Artery: Large caliber vessel with termination into an obtuse marginal branch. No obstructive disease.  Right Coronary Artery: Large  dominant vessel with 20% proximal stenosis.  Left Ventricular Angiogram: LVEF=65-70%.   EKG:  EKG is ordered today. The ekg ordered today demonstrates NSR, rate 67 bpm  Recent Labs: 09/18/2017: ALT 29; BUN 19; Creatinine, Ser 1.02; Platelets 274.0; TSH 1.49 10/26/2017: Hemoglobin 13.3; Potassium 3.9; Sodium 141   Lipid Panel    Component Value Date/Time   CHOL 177 09/18/2017 0938   TRIG 109.0 09/18/2017 0938   TRIG 81 10/04/2006 1227   HDL 70.90 09/18/2017 0938   CHOLHDL 2 09/18/2017 0938   VLDL 21.8 09/18/2017 0938   LDLCALC 84 09/18/2017 0938   LDLDIRECT 120.9 03/16/2012 1054     Wt Readings from Last 3 Encounters:  07/16/18 203 lb (92.1 kg)  06/28/18 202 lb 9.6 oz (91.9 kg)  10/26/17 198 lb 8 oz (90 kg)     Other studies Reviewed: Additional studies/ records that were reviewed today include: . Review of the above records demonstrates:    Assessment and Plan:   1. CAD without angina: He is known to have mild CAD by cath in September 2015. He has no chest pain. Will continue ASA and statin. He will lower his ASA to 81 mg daily.  2. HLD: Lipids followed in primary care. He will continue taking a statin.   3. HTN: BP has been elevated at home and is 150 systolic here today. Will add Norvasc 5 mg daily. Follow BP at home  Current medicines are reviewed at length with the patient today.  The patient does not have concerns regarding medicines.  The following changes have been made:  no change  Labs/ tests ordered today include:    Orders Placed This Encounter  Procedures  . EKG 12-Lead    Disposition:   FU with me in 12  months  Signed, Lauree Chandler, MD 07/16/2018 8:34 AM    [facility] [address], [place], [place]  [address] Phone: [phone]; Fax: [phone]

## 2018-07-16 ENCOUNTER — Encounter

## 2018-07-16 ENCOUNTER — Ambulatory Visit: Payer: 59 | Admitting: Cardiovascular Disease

## 2018-07-16 ENCOUNTER — Encounter: Payer: Self-pay | Admitting: Cardiovascular Disease

## 2018-07-16 VITALS — BP 144/82 | HR 67 | Ht 70.5 in | Wt 203.0 lb

## 2018-07-16 DIAGNOSIS — I10 Essential (primary) hypertension: Secondary | ICD-10-CM | POA: Diagnosis not present

## 2018-07-16 DIAGNOSIS — E7800 Pure hypercholesterolemia, unspecified: Secondary | ICD-10-CM | POA: Diagnosis not present

## 2018-07-16 DIAGNOSIS — I2510 Atherosclerotic heart disease of native coronary artery without angina pectoris: Secondary | ICD-10-CM | POA: Diagnosis not present

## 2018-07-16 MED ORDER — AMLODIPINE BESYLATE 5 MG PO TABS: 5.0000 mg | ORAL_TABLET | Freq: Every day | ORAL | 11 refills | Status: DC

## 2018-07-16 MED FILL — AMLODIPINE BESYLATE 5 MG TA: 5 | 30 days supply | Qty: 30 | Fill #0

## 2018-07-16 NOTE — Patient Instructions (Signed)
Medication Instructions:  Your physician has recommended you make the following change in your medication:  Start amlodipine 5 mg by mouth daily    Labwork: none  Testing/Procedures: none  Follow-Up: Your physician recommends that you schedule a follow-up appointment in: 12 months. Please call our office in about 8 months to schedule this appointment   Any Other Special Instructions Will Be Listed Below (If Applicable).     If you need a refill on your cardiac medications before your next appointment, please call your pharmacy.

## 2018-08-13 MED FILL — AMLODIPINE BESYLATE 5 MG TA: 5 | 30 days supply | Qty: 30 | Fill #1

## 2018-08-13 MED FILL — DICLOFENAC SODIUM 1% GEL: 1 | 32 days supply | Qty: 500 | Fill #0

## 2018-09-07 ENCOUNTER — Other Ambulatory Visit: Payer: Self-pay | Admitting: Cardiovascular Disease

## 2018-09-07 MED FILL — AMLODIPINE BESYLATE 5 MG TA: 5 | 30 days supply | Qty: 30 | Fill #2

## 2018-09-07 MED FILL — ROSUVASTATIN CALCIUM 20 MG: 20 | 90 days supply | Qty: 90 | Fill #0

## 2018-09-07 NOTE — Telephone Encounter (Signed)
Okay to continue to refill or should this be deferred to pcp? Per last office visit, lipids followed in primary care, he will continue taking a statin. Please advise. Thanks, MI

## 2018-09-07 NOTE — Telephone Encounter (Signed)
OK to refill

## 2018-09-17 ENCOUNTER — Telehealth: Payer: Self-pay | Admitting: General Practice

## 2018-09-17 DIAGNOSIS — Z125 Encounter for screening for malignant neoplasm of prostate: Secondary | ICD-10-CM

## 2018-09-17 DIAGNOSIS — I10 Essential (primary) hypertension: Secondary | ICD-10-CM

## 2018-09-17 NOTE — Telephone Encounter (Signed)
Labs ordered, pt scheduled   Copied from [place] [id number]. Topic: Appointment Scheduling - Scheduling Inquiry for Clinic >> Sep 17, 2018 11:39 AM Lennox Solders wrote: reason for CRM: pt is calling and he is sch for  cpe on 09/24/18 at 230 pm. Pt would like to have his labs in advance. >> Sep 17, 2018 11:53 AM Katina Dung, CMA wrote: Routing to PCP to advise >> Sep 17, 2018 12:03 PM Midge Minium, MD wrote: Madaline Brilliant to schedule labs CBC w/ diff, BMP, LFTs, lipid panel, TSH- dx HTN PSA- prostate cancer screen

## 2018-09-20 ENCOUNTER — Other Ambulatory Visit (INDEPENDENT_AMBULATORY_CARE_PROVIDER_SITE_OTHER): Payer: BLUE CROSS/BLUE SHIELD

## 2018-09-20 DIAGNOSIS — I10 Essential (primary) hypertension: Secondary | ICD-10-CM

## 2018-09-20 DIAGNOSIS — Z125 Encounter for screening for malignant neoplasm of prostate: Secondary | ICD-10-CM

## 2018-09-20 LAB — BASIC METABOLIC PANEL
BUN: 21 mg/dL (ref 6–23)
CHLORIDE: 104 meq/L (ref 96–112)
CO2: 27 mEq/L (ref 19–32)
Calcium: 9.3 mg/dL (ref 8.4–10.5)
Creatinine, Ser: 1.12 mg/dL (ref 0.40–1.50)
GFR: 71.04 mL/min (ref >60.00)
Glucose, Bld: 101 mg/dL — ABNORMAL HIGH (ref 70–99)
POTASSIUM: 4 meq/L (ref 3.5–5.1)
SODIUM: 140 meq/L (ref 135–145)

## 2018-09-20 LAB — CBC WITH DIFFERENTIAL/PLATELET
BASOS ABS: 0 10*3/uL (ref 0.0–0.1)
Basophils Relative: 1 % (ref 0.0–3.0)
EOS ABS: 0.3 10*3/uL (ref 0.0–0.7)
Eosinophils Relative: 5.3 % — ABNORMAL HIGH (ref 0.0–5.0)
HCT: 42.4 % (ref 39.0–52.0)
HEMOGLOBIN: 14.6 g/dL (ref 13.0–17.0)
LYMPHS PCT: 25.6 % (ref 12.0–46.0)
Lymphs Abs: 1.3 10*3/uL (ref 0.7–4.0)
MCHC: 34.5 g/dL (ref 30.0–36.0)
MCV: 87.1 fl (ref 78.0–100.0)
MONO ABS: 0.4 10*3/uL (ref 0.1–1.0)
Monocytes Relative: 7.6 % (ref 3.0–12.0)
NEUTROS ABS: 3.1 10*3/uL (ref 1.4–7.7)
Neutrophils Relative %: 60.5 % (ref 43.0–77.0)
PLATELETS: 261 10*3/uL (ref 150.0–400.0)
RBC: 4.87 Mil/uL (ref 4.22–5.81)
RDW: 13.5 % (ref 11.5–15.5)
WBC: 5.1 10*3/uL (ref 4.0–10.5)

## 2018-09-20 LAB — PSA: PSA: 1.35 ng/mL (ref 0.10–4.00)

## 2018-09-20 LAB — HEPATIC FUNCTION PANEL
ALBUMIN: 4.4 g/dL (ref 3.5–5.2)
ALT: 36 U/L (ref 0–53)
AST: 21 U/L (ref 0–37)
Alkaline Phosphatase: 59 U/L (ref 39–117)
BILIRUBIN TOTAL: 0.6 mg/dL (ref 0.2–1.2)
Bilirubin, Direct: 0.1 mg/dL (ref 0.0–0.3)
TOTAL PROTEIN: 6.7 g/dL (ref 6.0–8.3)

## 2018-09-20 LAB — LIPID PANEL
CHOL/HDL RATIO: 3
CHOLESTEROL: 180 mg/dL (ref 0–200)
HDL: 63 mg/dL (ref >39.00)
LDL CALC: 89 mg/dL (ref 0–99)
NonHDL: 116.97
TRIGLYCERIDES: 141 mg/dL (ref 0.0–149.0)
VLDL: 28.2 mg/dL (ref 0.0–40.0)

## 2018-09-20 LAB — TSH: TSH: 1.54 u[IU]/mL (ref 0.35–4.50)

## 2018-09-24 ENCOUNTER — Ambulatory Visit (INDEPENDENT_AMBULATORY_CARE_PROVIDER_SITE_OTHER): Payer: BLUE CROSS/BLUE SHIELD | Admitting: Family Medicine

## 2018-09-24 ENCOUNTER — Encounter: Payer: Self-pay | Admitting: Family Medicine

## 2018-09-24 ENCOUNTER — Other Ambulatory Visit: Payer: Self-pay

## 2018-09-24 VITALS — BP 140/82 | HR 76 | Temp 98.5°F | Resp 16 | Ht 71.0 in | Wt 205.0 lb

## 2018-09-24 DIAGNOSIS — E785 Hyperlipidemia, unspecified: Secondary | ICD-10-CM | POA: Diagnosis not present

## 2018-09-24 DIAGNOSIS — Z125 Encounter for screening for malignant neoplasm of prostate: Secondary | ICD-10-CM

## 2018-09-24 DIAGNOSIS — Z0000 Encounter for general adult medical examination without abnormal findings: Secondary | ICD-10-CM | POA: Diagnosis not present

## 2018-09-24 DIAGNOSIS — Z23 Encounter for immunization: Secondary | ICD-10-CM | POA: Diagnosis not present

## 2018-09-24 MED ORDER — MOMETASONE FUROATE 50 MCG/ACT NA SUSP: 2.0000 | Freq: Every day | NASAL | 12 refills | Status: DC

## 2018-09-24 NOTE — Addendum Note (Signed)
Addended by: Davis Gourd on: 09/24/2018 03:24 PM   Modules accepted: Orders

## 2018-09-24 NOTE — Patient Instructions (Signed)
Follow up in 6 months to recheck BP and cholesterol We'll notify you of your lab results and make any changes if needed Continue to work on healthy diet and regular exercise- you can do it! Call with any questions or concerns Happy Fall!!! 

## 2018-09-24 NOTE — Assessment & Plan Note (Signed)
Chronic problem.  Tolerating statin w/o difficulty.  Check labs.  Adjust meds prn  

## 2018-09-24 NOTE — Assessment & Plan Note (Signed)
[name] PE WNL.  UTD on colonoscopy- due next year.  Tdap and flu given today.  Reviewed recent labs w/ pt- all WNL.  Anticipatory guidance provided.

## 2018-09-24 NOTE — Progress Notes (Signed)
   Subjective:    Patient ID: Dan Sanchez, male    DOB: 1956-01-08, 62 y.o.   MRN: [id number]  HPI CPE- UTD on colonoscopy, due for Tdap, flu.  No concerns today.   Review of Systems Patient reports no vision/hearing changes, anorexia, fever ,adenopathy, persistant/recurrent hoarseness, swallowing issues, chest pain, palpitations, edema, persistant/recurrent cough, hemoptysis, dyspnea (rest,exertional, paroxysmal nocturnal), gastrointestinal  bleeding (melena, rectal bleeding), abdominal pain, excessive heart burn, GU symptoms (dysuria, hematuria, voiding/incontinence issues) syncope, focal weakness, memory loss, numbness & tingling, skin/hair/nail changes, depression, anxiety, abnormal bruising/bleeding, musculoskeletal symptoms/signs.     Objective:   Physical Exam BP 140/82   Pulse 76   Temp 98.5 F (36.9 C) (Oral)   Resp 16   Ht 5\' 11"  (1.803 m)   Wt 205 lb (93 kg)   SpO2 97%   BMI 28.59 kg/m   General Appearance:    Alert, cooperative, no distress, appears stated age  Head:    Normocephalic, without obvious abnormality, atraumatic  Eyes:    PERRL, conjunctiva/corneas clear, EOM's intact, fundi    benign, both eyes       Ears:    Normal TM's and external ear canals, both ears  Nose:   Nares normal, septum midline, mucosa normal, no drainage   or sinus tenderness  Throat:   Lips, mucosa, and tongue normal; teeth and gums normal  Neck:   Supple, symmetrical, trachea midline, no adenopathy;       thyroid:  No enlargement/tenderness/nodules  Back:     Symmetric, no curvature, ROM normal, no CVA tenderness  Lungs:     Clear to auscultation bilaterally, respirations unlabored  Chest wall:    No tenderness or deformity  Heart:    Regular rate and rhythm, S1 and S2 normal, no murmur, rub   or gallop  Abdomen:     Soft, non-tender, bowel sounds active all four quadrants,    no masses, no organomegaly  Genitalia:    Normal male without lesion, discharge or tenderness  Rectal:     Normal tone, normal prostate, no masses or tenderness  Extremities:   Extremities normal, atraumatic, no cyanosis or edema  Pulses:   2+ and symmetric all extremities  Skin:   Skin color, texture, turgor normal, no rashes or lesions  Lymph nodes:   Cervical, supraclavicular, and axillary nodes normal  Neurologic:   CNII-XII intact. Normal strength, sensation and reflexes      throughout          Assessment & Plan:

## 2018-10-01 ENCOUNTER — Other Ambulatory Visit: Payer: Self-pay | Admitting: Family Medicine

## 2018-10-01 MED FILL — AMLODIPINE BESYLATE 5 MG TA: 5 | 30 days supply | Qty: 30 | Fill #3

## 2018-10-02 MED FILL — OMEPRAZOLE 20 MG CPDR: 20 | 90 days supply | Qty: 90 | Fill #0

## 2018-10-12 MED FILL — AZELASTINE HCL 137 MCG/SPRA: 137 | 30 days supply | Qty: 30 | Fill #1

## 2018-10-12 MED FILL — FLUTICASONE PROP 50 MCG SPR: 50 | 30 days supply | Qty: 16 | Fill #1

## 2018-11-12 MED FILL — AMLODIPINE BESYLATE 5 MG TA: 5 | 30 days supply | Qty: 30 | Fill #4

## 2018-11-27 ENCOUNTER — Encounter: Payer: Self-pay | Admitting: Family Medicine

## 2018-11-27 ENCOUNTER — Other Ambulatory Visit: Payer: Self-pay

## 2018-11-27 ENCOUNTER — Ambulatory Visit: Payer: BLUE CROSS/BLUE SHIELD | Admitting: Family Medicine

## 2018-11-27 VITALS — BP 130/80 | HR 77 | Temp 98.1°F | Resp 16 | Ht 71.0 in | Wt 209.4 lb

## 2018-11-27 DIAGNOSIS — J0140 Acute pansinusitis, unspecified: Secondary | ICD-10-CM

## 2018-11-27 DIAGNOSIS — R0683 Snoring: Secondary | ICD-10-CM | POA: Diagnosis not present

## 2018-11-27 MED ORDER — PREDNISONE 10 MG PO TABS: ORAL_TABLET | ORAL | 0 refills | Status: DC

## 2018-11-27 MED FILL — predniSONE 10 MG TABS: 10 | 9 days supply | Qty: 18 | Fill #0

## 2018-11-27 NOTE — Progress Notes (Signed)
   Subjective:    Patient ID: Dan Sanchez, male    DOB: Aug 31, 1956, 62 y.o.   MRN: [id number]  HPI Snoring- pt reports wife won't let him sleep in the bed at this point.  + drainage, sinus pressure.  Snoring is fairly recent.  Not using Flonase regularly, taking Allegra daily.  No fevers.  No tooth pain.  No noted breathing pauses.  Saw ENT 'years ago- at least 20 years ago'.   Review of Systems For ROS see HPI     Objective:   Physical Exam Vitals signs reviewed.  Constitutional:      General: He is not in acute distress.    Appearance: He is well-developed.  HENT:     Head: Normocephalic and atraumatic.     Right Ear: Tympanic membrane normal.     Left Ear: Tympanic membrane normal.     Nose: Congestion (mild) present. No rhinorrhea.     Comments: Deviated septum No TTP over sinuses    Mouth/Throat:     Comments: Crowded oropharynx w/ narrow palatine arch Eyes:     Conjunctiva/sclera: Conjunctivae normal.     Pupils: Pupils are equal, round, and reactive to light.  Neck:     Musculoskeletal: Normal range of motion and neck supple.  Cardiovascular:     Rate and Rhythm: Normal rate and regular rhythm.     Heart sounds: Normal heart sounds.  Pulmonary:     Effort: Pulmonary effort is normal. No respiratory distress.     Breath sounds: Normal breath sounds. No wheezing.  Lymphadenopathy:     Cervical: No cervical adenopathy.  Skin:    General: Skin is warm and dry.  Psychiatric:        Mood and Affect: Mood normal.        Behavior: Behavior normal.           Assessment & Plan:  Sinus inflammation- new.  No evidence of infxn so no abx needed.  Start prednisone.  Reviewed supportive care and red flags that should prompt return.  Pt expressed understanding and is in agreement w/ plan.   Snoring- worsening.  Wife reports this is louder than previously.  No noted breathing pauses.  Given crowded oropharynx concern for OSA.  Refer to ENT for complete evaluation as pt  also has deviated nasal septum.  Pt expressed understanding and is in agreement w/ plan.

## 2018-11-27 NOTE — Patient Instructions (Signed)
Follow up as needed or as scheduled We'll call you with your ENT appt Take your allergy pill daily and use the Flonase every day! Take the Prednisone as directed- take w/ food Call with any questions or concerns Happy New Year!

## 2018-12-07 MED FILL — FLUTICASONE PROP 50 MCG SPR: 50 | 30 days supply | Qty: 16 | Fill #2

## 2018-12-07 MED FILL — ROSUVASTATIN CALCIUM 20 MG: 20 | 90 days supply | Qty: 90 | Fill #1

## 2018-12-07 MED FILL — AMLODIPINE BESYLATE 5 MG TA: 5 | 30 days supply | Qty: 30 | Fill #5

## 2018-12-31 MED FILL — OMEPRAZOLE 20 MG CPDR: 20 | 90 days supply | Qty: 90 | Fill #1

## 2018-12-31 MED FILL — AMLODIPINE BESYLATE 5 MG TA: 5 | 30 days supply | Qty: 30 | Fill #6

## 2019-02-01 DIAGNOSIS — J3489 Other specified disorders of nose and nasal sinuses: Secondary | ICD-10-CM | POA: Insufficient documentation

## 2019-02-01 DIAGNOSIS — J342 Deviated nasal septum: Secondary | ICD-10-CM | POA: Insufficient documentation

## 2019-02-01 DIAGNOSIS — J343 Hypertrophy of nasal turbinates: Secondary | ICD-10-CM | POA: Insufficient documentation

## 2019-02-01 MED FILL — AMLODIPINE BESYLATE 5 MG TA: 5 | 30 days supply | Qty: 30 | Fill #7

## 2019-03-12 MED FILL — AMLODIPINE BESYLATE 5 MG TA: 5 | 30 days supply | Qty: 30 | Fill #8

## 2019-03-12 MED FILL — ROSUVASTATIN CALCIUM 20 MG: 20 | 90 days supply | Qty: 90 | Fill #2

## 2019-03-29 ENCOUNTER — Ambulatory Visit (INDEPENDENT_AMBULATORY_CARE_PROVIDER_SITE_OTHER): Payer: BLUE CROSS/BLUE SHIELD | Admitting: Family Medicine

## 2019-03-29 ENCOUNTER — Encounter: Payer: Self-pay | Admitting: Family Medicine

## 2019-03-29 VITALS — Ht 71.0 in | Wt 194.0 lb

## 2019-03-29 DIAGNOSIS — E785 Hyperlipidemia, unspecified: Secondary | ICD-10-CM

## 2019-03-29 DIAGNOSIS — I10 Essential (primary) hypertension: Secondary | ICD-10-CM | POA: Diagnosis not present

## 2019-03-29 DIAGNOSIS — E663 Overweight: Secondary | ICD-10-CM | POA: Diagnosis not present

## 2019-03-29 NOTE — Progress Notes (Signed)
Virtual Visit via Video   I connected with patient on 03/29/19 at  8:40 AM EDT by a video enabled telemedicine application and verified that I am speaking with the correct person using two identifiers.  Location patient: Home Location provider: [facility], Office Persons participating in the virtual visit: Patient, Provider, [place] ([place])  I discussed the limitations of evaluation and management by telemedicine and the availability of in person appointments. The patient expressed understanding and agreed to proceed.  Subjective:   HPI:   HTN- chronic problem, on Amlodipine 5mg  daily.  No way to check BP today.  Denies CP, SOB, HAs, visual changes, edema.  Hyperlipidemia- chronic problem, on Crestor 20mg  nightly.  Denies abd pain, N/V.  Overweight- pt has lost 15 lbs since last visit. BMI 27.  Doing yard work but no formal exercise  ROS:   See pertinent positives and negatives per HPI.  Patient Active Problem List   Diagnosis Date Noted  . S/P right rotator cuff repair 10/26/2017  . Contact dermatitis 08/27/2015  . HSV-1 (herpes simplex virus 1) infection 08/27/2015  . Bee sting allergy 09/10/2014  . Chest pain 08/11/2014  . Elevated BP 08/10/2014  . Allergic rhinitis 08/10/2014  . Allergic conjunctivitis 02/20/2014  . Insomnia 02/03/2014  . GERD (gastroesophageal reflux disease) 02/03/2014  . General medical examination 03/16/2012  . Hyperlipidemia 12/24/2007  . ERECTILE DYSFUNCTION 12/24/2007    Social History   Tobacco Use  . Smoking status: Never Smoker  . Smokeless tobacco: Never Used  Substance Use Topics  . Alcohol use: Yes    Alcohol/week: 4.0 standard drinks    Types: 4 Standard drinks or equivalent per week    Comment: occ    Current Outpatient Medications:  .  amLODipine (NORVASC) 5 MG tablet, Take 1 tablet (5 mg total) by mouth daily., Disp: 30 tablet, Rfl: 11 .  Ascorbic Acid (VITAMIN C) 1000 MG tablet, Take 1,000 mg by mouth  daily., Disp: , Rfl:  .  aspirin EC 325 MG tablet, Take 325 mg by mouth at bedtime., Disp: , Rfl:  .  fexofenadine (ALLEGRA ALLERGY) 180 MG tablet, Allegra Allergy, Disp: , Rfl:  .  fluticasone (FLONASE) 50 MCG/ACT nasal spray, Place 2 sprays into both nostrils daily., Disp: 16 g, Rfl: 6 .  Multiple Vitamin (MULTIVITAMIN WITH MINERALS) TABS tablet, Take 1 tablet by mouth daily., Disp: , Rfl:  .  Omega-3 Fatty Acids (FISH OIL) 1200 MG CAPS, Take 1,200 mg by mouth 2 (two) times daily., Disp: , Rfl:  .  omeprazole (PRILOSEC) 20 MG capsule, TAKE ONE CAPSULE BY MOUTH DAILY, Disp: 90 capsule, Rfl: 3 .  rosuvastatin (CRESTOR) 20 MG tablet, TAKE 1 TABLET (20 MG TOTAL) BY MOUTH DAILY., Disp: 90 tablet, Rfl: 2 .  valACYclovir (VALTREX) 1000 MG tablet, Take 1,000 mg by mouth 2 (two) times daily. At the sight of a cold sore, Disp: , Rfl:  .  cetirizine (ZYRTEC) 10 MG tablet, Take 10 mg by mouth daily., Disp: , Rfl:  .  mometasone (NASONEX) 50 MCG/ACT nasal spray, Place 2 sprays into the nose daily., Disp: 17 g, Rfl: 12 .  predniSONE (DELTASONE) 10 MG tablet, 3 tabs x3 days and then 2 tabs x3 days and then 1 tab x3 days.  Take w/ food., Disp: 18 tablet, Rfl: 0  No Known Allergies  Objective:   Ht 5\' 11"  (1.803 m)   Wt 194 lb (88 kg)   BMI 27.06 kg/m   AAOx3, NAD NCAT,  EOMI Hard of hearing No obvious CN deficits Coloring WNL Pt is able to speak clearly, coherently without shortness of breath or increased work of breathing.  Thought process is linear.  Mood is appropriate.   Assessment and Plan:   HTN- no way to check home BP but will check this when he comes for labs.  Encouraged healthy diet and regular exercise.  He is currently asymptomatic.  Will follow.  Hyperlipidemia- chronic problem.  Tolerating statin w/o difficulty.  Check labs.  Adjust meds prn   Overweight- pt has lost weight by increasing his activity level w/ the warmer weather.  Encouraged him to continue.  Will follow.    Annye Asa, MD 03/29/2019

## 2019-03-29 NOTE — Progress Notes (Signed)
I have discussed the procedure for the virtual visit with the patient who has given consent to proceed with assessment and treatment.   Jacqualin Shirkey, CMA     

## 2019-04-01 ENCOUNTER — Other Ambulatory Visit (INDEPENDENT_AMBULATORY_CARE_PROVIDER_SITE_OTHER): Payer: BLUE CROSS/BLUE SHIELD

## 2019-04-01 DIAGNOSIS — E785 Hyperlipidemia, unspecified: Secondary | ICD-10-CM | POA: Diagnosis not present

## 2019-04-01 DIAGNOSIS — I10 Essential (primary) hypertension: Secondary | ICD-10-CM

## 2019-04-01 LAB — CBC WITH DIFFERENTIAL/PLATELET
Basophils Absolute: 0 10*3/uL (ref 0.0–0.1)
Basophils Relative: 1.1 % (ref 0.0–3.0)
Eosinophils Absolute: 0.2 10*3/uL (ref 0.0–0.7)
Eosinophils Relative: 4.4 % (ref 0.0–5.0)
HCT: 41.7 % (ref 39.0–52.0)
Hemoglobin: 14.4 g/dL (ref 13.0–17.0)
Lymphocytes Relative: 29.7 % (ref 12.0–46.0)
Lymphs Abs: 1.1 10*3/uL (ref 0.7–4.0)
MCHC: 34.4 g/dL (ref 30.0–36.0)
MCV: 88.8 fl (ref 78.0–100.0)
Monocytes Absolute: 0.4 10*3/uL (ref 0.1–1.0)
Monocytes Relative: 9.8 % (ref 3.0–12.0)
Neutro Abs: 2.1 10*3/uL (ref 1.4–7.7)
Neutrophils Relative %: 55 % (ref 43.0–77.0)
Platelets: 247 10*3/uL (ref 150.0–400.0)
RBC: 4.7 Mil/uL (ref 4.22–5.81)
RDW: 13.1 % (ref 11.5–15.5)
WBC: 3.8 10*3/uL — ABNORMAL LOW (ref 4.0–10.5)

## 2019-04-01 LAB — LIPID PANEL
Cholesterol: 158 mg/dL (ref 0–200)
HDL: 63.2 mg/dL (ref >39.00)
LDL Cholesterol: 72 mg/dL (ref 0–99)
NonHDL: 94.69
Total CHOL/HDL Ratio: 2
Triglycerides: 111 mg/dL (ref 0.0–149.0)
VLDL: 22.2 mg/dL (ref 0.0–40.0)

## 2019-04-01 LAB — BASIC METABOLIC PANEL
BUN: 16 mg/dL (ref 6–23)
CO2: 29 mEq/L (ref 19–32)
Calcium: 9.1 mg/dL (ref 8.4–10.5)
Chloride: 104 mEq/L (ref 96–112)
Creatinine, Ser: 1.08 mg/dL (ref 0.40–1.50)
GFR: 69.58 mL/min (ref >60.00)
Glucose, Bld: 99 mg/dL (ref 70–99)
Potassium: 4 mEq/L (ref 3.5–5.1)
Sodium: 141 mEq/L (ref 135–145)

## 2019-04-01 LAB — HEPATIC FUNCTION PANEL
ALT: 31 U/L (ref 0–53)
AST: 22 U/L (ref 0–37)
Albumin: 4.4 g/dL (ref 3.5–5.2)
Alkaline Phosphatase: 63 U/L (ref 39–117)
Bilirubin, Direct: 0.1 mg/dL (ref 0.0–0.3)
Total Bilirubin: 0.6 mg/dL (ref 0.2–1.2)
Total Protein: 6.7 g/dL (ref 6.0–8.3)

## 2019-04-01 LAB — TSH: TSH: 1.84 u[IU]/mL (ref 0.35–4.50)

## 2019-04-02 MED FILL — OMEPRAZOLE 20 MG CPDR: 20 | 90 days supply | Qty: 90 | Fill #2

## 2019-04-09 MED FILL — AMLODIPINE BESYLATE 5 MG TA: 5 | 30 days supply | Qty: 30 | Fill #9

## 2019-04-10 ENCOUNTER — Encounter: Payer: Self-pay | Admitting: Internal Medicine

## 2019-04-10 ENCOUNTER — Telehealth: Payer: Self-pay | Admitting: Family Medicine

## 2019-04-10 DIAGNOSIS — Z1211 Encounter for screening for malignant neoplasm of colon: Secondary | ICD-10-CM

## 2019-04-10 NOTE — Telephone Encounter (Signed)
Referral placed.

## 2019-04-10 NOTE — Telephone Encounter (Signed)
Pt states that he is due for colonoscopy and asking for a referral, his last one was with [facility] and would like to go there again.

## 2019-04-10 NOTE — Telephone Encounter (Signed)
[facility] for referral? Per HM pt is due for this.

## 2019-04-10 NOTE — Telephone Encounter (Signed)
Ok to refer.

## 2019-04-10 NOTE — Addendum Note (Signed)
Addended by: Davis Gourd on: 04/10/2019 03:24 PM   Modules accepted: Orders

## 2019-05-13 ENCOUNTER — Encounter: Payer: Self-pay | Admitting: Internal Medicine

## 2019-05-13 MED FILL — AMLODIPINE BESYLATE 5 MG TA: 5 | 30 days supply | Qty: 30 | Fill #10

## 2019-05-14 ENCOUNTER — Ambulatory Visit: Payer: BC Managed Care – PPO | Admitting: *Deleted

## 2019-05-14 ENCOUNTER — Other Ambulatory Visit: Payer: Self-pay

## 2019-05-14 VITALS — Ht 71.0 in | Wt 195.0 lb

## 2019-05-14 DIAGNOSIS — Z1211 Encounter for screening for malignant neoplasm of colon: Secondary | ICD-10-CM

## 2019-05-14 NOTE — Progress Notes (Signed)
Patient's pre-visit was done today over the phone with the patient due to COVID-19 pandemic. Name,DOB and address verified. Insurance verified. Packet of Prep instructions mailed to patient including copy of a consent form and pre-procedure patient acknowledgement form-pt is aware. Patient understands to call us back with any questions or concerns.   Patient denies any allergies to eggs or soy. Patient denies any problems with anesthesia/sedation. Patient denies any oxygen use at home. Patient denies taking any diet/weight loss medications or blood thinners. EMMI education assisgned to patient on colonoscopy, this was explained and instructions given to patient.  Pt is aware that care partner will wait in the car during parking lot; if they feel like they will be too hot to wait in the car; they may wait in the lobby.  We want them to wear a mask (we do not have any that we can provide them), practice social distancing, and we will check their temperatures when they get here.  I did remind patient that their care partner needs to stay in the parking lot the entire time. Pt will wear mask into building 

## 2019-05-16 ENCOUNTER — Encounter: Payer: Self-pay | Admitting: Internal Medicine

## 2019-05-24 ENCOUNTER — Telehealth: Payer: Self-pay | Admitting: Internal Medicine

## 2019-05-24 NOTE — Telephone Encounter (Signed)

## 2019-05-27 ENCOUNTER — Other Ambulatory Visit: Payer: Self-pay

## 2019-05-27 ENCOUNTER — Encounter: Payer: Self-pay | Admitting: Internal Medicine

## 2019-05-27 ENCOUNTER — Ambulatory Visit (AMBULATORY_SURGERY_CENTER): Payer: BC Managed Care – PPO | Admitting: Internal Medicine

## 2019-05-27 VITALS — BP 134/81 | HR 55 | Temp 98.4°F | Resp 16 | Ht 71.0 in | Wt 195.0 lb

## 2019-05-27 DIAGNOSIS — Z1211 Encounter for screening for malignant neoplasm of colon: Secondary | ICD-10-CM

## 2019-05-27 MED ORDER — SODIUM CHLORIDE 0.9 % IV SOLN: 500.0000 mL | Freq: Once | INTRAVENOUS | Status: DC

## 2019-05-27 NOTE — Progress Notes (Signed)
PT taken to PACU. Monitors in place. VSS. Report given to RN. 

## 2019-05-27 NOTE — Op Note (Signed)
[facility] Patient Name: Dan Sanchez Procedure Date: 05/27/2019 7:35 AM MRN: [id number] Endoscopist: Gatha Mayer , MD Age: 63 Referring MD:  Date of Birth: 1956/08/20 Gender: Male Account #: [id number] Procedure:                Colonoscopy Indications:              Screening for colorectal malignant neoplasm, Last                            colonoscopy: 2010 Medicines:                Propofol per Anesthesia, Monitored Anesthesia Care Procedure:                Pre-Anesthesia Assessment:                           - Prior to the procedure, a History and Physical                            was performed, and patient medications and                            allergies were reviewed. The patient's tolerance of                            previous anesthesia was also reviewed. The risks                            and benefits of the procedure and the sedation                            options and risks were discussed with the patient.                            All questions were answered, and informed consent                            was obtained. Prior Anticoagulants: The patient has                            taken no previous anticoagulant or antiplatelet                            agents. ASA Grade Assessment: II - A patient with                            mild systemic disease. After reviewing the risks                            and benefits, the patient was deemed in                            satisfactory condition to undergo the procedure.  After obtaining informed consent, the colonoscope                            was passed under direct vision. Throughout the                            procedure, the patient's blood pressure, pulse, and                            oxygen saturations were monitored continuously. The                            Colonoscope was introduced through the anus and                            advanced to the the  cecum, identified by                            appendiceal orifice and ileocecal valve. The                            colonoscopy was performed without difficulty. The                            patient tolerated the procedure well. The quality                            of the bowel preparation was adequate. The                            ileocecal valve, appendiceal orifice, and rectum                            were photographed. The bowel preparation used was                            Miralax via split dose instruction. Scope In: 7:43:39 AM Scope Out: 7:59:10 AM Scope Withdrawal Time: 0 hours 11 minutes 37 seconds  Total Procedure Duration: 0 hours 15 minutes 31 seconds  Findings:                 The perianal and digital rectal examinations were                            normal.                           Multiple diverticula were found in the sigmoid                            colon.                           The exam was otherwise without abnormality on  direct and retroflexion views. Complications:            No immediate complications. Estimated Blood Loss:     Estimated blood loss: none. Impression:               - Diverticulosis in the sigmoid colon.                           - The examination was otherwise normal on direct                            and retroflexion views.                           - No specimens collected. Recommendation:           - Patient has a contact number available for                            emergencies. The signs and symptoms of potential                            delayed complications were discussed with the                            patient. Return to normal activities tomorrow.                            Written discharge instructions were provided to the                            patient.                           - Resume previous diet.                           - Continue present medications.                            - Repeat colonoscopy in 10 years for screening                            purposes. Gatha Mayer, MD 05/27/2019 8:08:22 AM This report has been signed electronically.

## 2019-05-27 NOTE — Patient Instructions (Addendum)
No polyps or cancer seen.  You do have diverticulosis - thickened muscle rings and pouches in the colon wall. Please read the handout about this condition.  Next routine colonoscopy or other screening test in 10 years - 2030  I appreciate the opportunity to care for you. Gatha Mayer, MD, FACG   YOU HAD AN ENDOSCOPIC PROCEDURE TODAY AT [facility]:   Refer to the procedure report that was given to you for any specific questions about what was found during the examination.  If the procedure report does not answer your questions, please call your gastroenterologist to clarify.  If you requested that your care partner not be given the details of your procedure findings, then the procedure report has been included in a sealed envelope for you to review at your convenience later.  YOU SHOULD EXPECT: Some feelings of bloating in the abdomen. Passage of more gas than usual.  Walking can help get rid of the air that was put into your GI tract during the procedure and reduce the bloating. If you had a lower endoscopy (such as a colonoscopy or flexible sigmoidoscopy) you may notice spotting of blood in your stool or on the toilet paper. If you underwent a bowel prep for your procedure, you may not have a normal bowel movement for a few days.  Please Note:  You might notice some irritation and congestion in your nose or some drainage.  This is from the oxygen used during your procedure.  There is no need for concern and it should clear up in a day or so.  SYMPTOMS TO REPORT IMMEDIATELY:   Following lower endoscopy (colonoscopy or flexible sigmoidoscopy):  Excessive amounts of blood in the stool  Significant tenderness or worsening of abdominal pains  Swelling of the abdomen that is new, acute  Fever of 100F or higher    For urgent or emergent issues, a gastroenterologist can be reached at any hour by calling [phone].   DIET:  We do recommend a small meal at  first, but then you may proceed to your regular diet.  Drink plenty of fluids but you should avoid alcoholic beverages for 24 hours.  ACTIVITY:  You should plan to take it easy for the rest of today and you should NOT DRIVE or use heavy machinery until tomorrow (because of the sedation medicines used during the test).    FOLLOW UP: Our staff will call the number listed on your records 48-72 hours following your procedure to check on you and address any questions or concerns that you may have regarding the information given to you following your procedure. If we do not reach you, we will leave a message.  We will attempt to reach you two times.  During this call, we will ask if you have developed any symptoms of COVID 19. If you develop any symptoms (ie: fever, flu-like symptoms, shortness of breath, cough etc.) before then, please call [phone].  If you test positive for Covid 19 in the 2 weeks post procedure, please call and report this information to [place].    If any biopsies were taken you will be contacted by phone or by letter within the next 1-3 weeks.  Please call us at [phone] if you have not heard about the biopsies in 3 weeks.    SIGNATURES/CONFIDENTIALITY: You and/or your care partner have signed paperwork which will be entered into your electronic medical record.  These signatures attest to the fact that  that the information above on your After Visit Summary has been reviewed and is understood.  Full responsibility of the confidentiality of this discharge information lies with you and/or your care-partner.

## 2019-05-29 ENCOUNTER — Telehealth: Payer: Self-pay

## 2019-05-29 NOTE — Telephone Encounter (Signed)
  Follow up Call-  Call back number 05/27/2019  Post procedure Call Back phone  # [phone]  Permission to leave phone message Yes  Some recent data might be hidden     Patient questions:  Do you have a fever, pain , or abdominal swelling? No. Pain Score  0 *  Have you tolerated food without any problems? Yes.    Have you been able to return to your normal activities? Yes.    Do you have any questions about your discharge instructions: Diet   No. Medications  No. Follow up visit  No.  Do you have questions or concerns about your Care? No.  Actions: * If pain score is 4 or above: No action needed, pain <4.  1. Have you developed a fever since your procedure? no  2.   Have you had an respiratory symptoms (SOB or cough) since your procedure? no  3.   Have you tested positive for COVID 19 since your procedure no  4.   Have you had any family members/close contacts diagnosed with the COVID 19 since your procedure?  no   If yes to any of these questions please route to Joylene John, RN and Alphonsa Gin, Therapist, sports.

## 2019-06-10 ENCOUNTER — Other Ambulatory Visit: Payer: Self-pay | Admitting: Cardiovascular Disease

## 2019-06-10 MED ORDER — ROSUVASTATIN CALCIUM 20 MG PO TABS: 20.0000 mg | ORAL_TABLET | Freq: Every day | ORAL | 0 refills | Status: DC

## 2019-06-10 MED FILL — AMLODIPINE BESYLATE 5 MG TA: 5 | 30 days supply | Qty: 30 | Fill #11

## 2019-06-10 MED FILL — ROSUVASTATIN CALCIUM 20 MG: 20 | 90 days supply | Qty: 90 | Fill #0

## 2019-07-02 MED FILL — OMEPRAZOLE 20 MG CAP: 20 | 90 days supply | Qty: 90 | Fill #3

## 2019-07-15 ENCOUNTER — Other Ambulatory Visit: Payer: Self-pay | Admitting: Cardiovascular Disease

## 2019-07-16 MED FILL — AMLODIPINE BESYLATE 5 MG TA: 5 | 30 days supply | Qty: 30 | Fill #0

## 2019-08-12 ENCOUNTER — Other Ambulatory Visit: Payer: Self-pay | Admitting: Cardiovascular Disease

## 2019-08-12 MED FILL — AMLODIPINE BESYLATE 5 MG TA: 5 | 90 days supply | Qty: 90 | Fill #0

## 2019-08-22 ENCOUNTER — Other Ambulatory Visit: Payer: Self-pay

## 2019-08-22 ENCOUNTER — Ambulatory Visit (INDEPENDENT_AMBULATORY_CARE_PROVIDER_SITE_OTHER): Payer: BC Managed Care – PPO | Admitting: Physician Assistant

## 2019-08-22 ENCOUNTER — Encounter: Payer: Self-pay | Admitting: Physician Assistant

## 2019-08-22 DIAGNOSIS — J310 Chronic rhinitis: Secondary | ICD-10-CM | POA: Diagnosis not present

## 2019-08-22 DIAGNOSIS — B9689 Other specified bacterial agents as the cause of diseases classified elsewhere: Secondary | ICD-10-CM

## 2019-08-22 DIAGNOSIS — J019 Acute sinusitis, unspecified: Secondary | ICD-10-CM | POA: Diagnosis not present

## 2019-08-22 MED ORDER — AMOXICILLIN-POT CLAVULANATE 875-125 MG PO TABS: 1.0000 | ORAL_TABLET | Freq: Two times a day (BID) | ORAL | 0 refills | Status: DC

## 2019-08-22 MED ORDER — AZELASTINE HCL 0.1 % NA SOLN: 1.0000 | Freq: Two times a day (BID) | NASAL | 0 refills | Status: DC

## 2019-08-22 MED FILL — AZELASTINE HCL 137 MCG SPRY: 0.1 | 50 days supply | Qty: 30 | Fill #0

## 2019-08-22 MED FILL — AMOX-CLAV 875-125 MG TABLET: 875-125 | 7 days supply | Qty: 14 | Fill #0

## 2019-08-22 NOTE — Progress Notes (Signed)
Virtual Visit via Video   I connected with patient on 08/22/19 at  8:00 AM EDT by a video enabled telemedicine application and verified that I am speaking with the correct person using two identifiers.  Location patient: Home Location provider: [facility], Office Persons participating in the virtual visit: Patient, Provider, [place] (Dan Sanchez)  I discussed the limitations of evaluation and management by telemedicine and the availability of in person appointments. The patient expressed understanding and agreed to proceed.  Subjective:   HPI:   Patient presents via [facility]Me today c/o 4 weeks of nasal congestion, sinus pressure, ear pressure now with a couple weeks of maxillary sinus pain and headache. Denies tooth pain. Notes significant ear pressure bilaterally. Denies fevers, chills. Denies chest congestion or cough. Denies recent travel or sick contact. Afrin using nightly over the past month. Taking Allegra daily.   ROS:   See pertinent positives and negatives per HPI.  Patient Active Problem List   Diagnosis Date Noted  . HTN (hypertension) 03/29/2019  . Overweight (BMI 25.0-29.9) 03/29/2019  . Nasal septal deviation 02/01/2019  . S/P right rotator cuff repair 10/26/2017  . Contact dermatitis 08/27/2015  . HSV-1 (herpes simplex virus 1) infection 08/27/2015  . Bee sting allergy 09/10/2014  . Chest pain 08/11/2014  . Allergic rhinitis 08/10/2014  . Allergic conjunctivitis 02/20/2014  . Insomnia 02/03/2014  . GERD (gastroesophageal reflux disease) 02/03/2014  . General medical examination 03/16/2012  . Hyperlipidemia 12/24/2007  . ERECTILE DYSFUNCTION 12/24/2007    Social History   Tobacco Use  . Smoking status: Never Smoker  . Smokeless tobacco: Never Used  Substance Use Topics  . Alcohol use: Yes    Alcohol/week: 4.0 standard drinks    Types: 4 Standard drinks or equivalent per week    Comment: occ    Current Outpatient Medications:  .  amLODipine  (NORVASC) 5 MG tablet, Take 1 tablet (5 mg total) by mouth daily., Disp: 90 tablet, Rfl: 0 .  Ascorbic Acid (VITAMIN C) 1000 MG tablet, Take 1,000 mg by mouth daily., Disp: , Rfl:  .  aspirin EC 325 MG tablet, Take 325 mg by mouth at bedtime., Disp: , Rfl:  .  fexofenadine (ALLEGRA ALLERGY) 180 MG tablet, Allegra Allergy, Disp: , Rfl:  .  fluticasone (FLONASE) 50 MCG/ACT nasal spray, Place 2 sprays into both nostrils daily., Disp: 16 g, Rfl: 6 .  mometasone (NASONEX) 50 MCG/ACT nasal spray, Place 2 sprays into the nose daily., Disp: 17 g, Rfl: 12 .  Multiple Vitamin (MULTIVITAMIN WITH MINERALS) TABS tablet, Take 1 tablet by mouth daily., Disp: , Rfl:  .  Omega-3 Fatty Acids (FISH OIL) 1200 MG CAPS, Take 1,200 mg by mouth 2 (two) times daily., Disp: , Rfl:  .  omeprazole (PRILOSEC) 20 MG capsule, TAKE ONE CAPSULE BY MOUTH DAILY, Disp: 90 capsule, Rfl: 3 .  rosuvastatin (CRESTOR) 20 MG tablet, Take 1 tablet (20 mg total) by mouth daily. Please make yearly appt with Dr. Angelena Form for August before anymore refills. 1st attempt, Disp: 90 tablet, Rfl: 0 .  valACYclovir (VALTREX) 1000 MG tablet, Take 1,000 mg by mouth 2 (two) times daily. At the sight of a cold sore, Disp: , Rfl:   Allergies  Allergen Reactions  . Bee Venom Swelling    Objective:   There were no vitals taken for this visit.  Patient is well-developed, well-nourished in no acute distress.  Resting comfortably at home.  Head is normocephalic, atraumatic.  No labored breathing.  Speech  is clear and coherent with logical contest.  Patient is alert and oriented at baseline.  + TTP maxillary sinuses on exam (patient performed). No other TTP.   Assessment and Plan:   1. Acute bacterial sinusitis 2. Chronic rhinitis Rx Augmentin.  Increase fluids.  Rest.  Saline nasal spray.  Probiotic.  Mucinex as directed.  Humidifier in bedroom. Stop Afrin. Resume Flonase. Use Astelin for the next week as well. Supportive measures reviewed.  Call  or return to clinic if symptoms are not improving.  - amoxicillin-clavulanate (AUGMENTIN) 875-125 MG tablet; Take 1 tablet by mouth 2 (two) times daily.  Dispense: 14 tablet; Refill: 0 - azelastine (ASTELIN) 0.1 % nasal spray; Place 1 spray into both nostrils 2 (two) times daily. Use in each nostril as directed  Dispense: 30 mL; Refill: 0   .   Leeanne Rio, PA-C 08/22/2019

## 2019-08-22 NOTE — Progress Notes (Signed)
I have discussed the procedure for the virtual visit with the patient who has given consent to proceed with assessment and treatment.   Azir Muzyka S Teigen Parslow, CMA     

## 2019-09-05 ENCOUNTER — Other Ambulatory Visit: Payer: Self-pay

## 2019-09-05 ENCOUNTER — Encounter: Payer: Self-pay | Admitting: Cardiovascular Disease

## 2019-09-05 ENCOUNTER — Ambulatory Visit: Payer: BC Managed Care – PPO | Admitting: Cardiovascular Disease

## 2019-09-05 VITALS — BP 172/82 | HR 78 | Ht 71.0 in | Wt 200.8 lb

## 2019-09-05 DIAGNOSIS — I2510 Atherosclerotic heart disease of native coronary artery without angina pectoris: Secondary | ICD-10-CM

## 2019-09-05 DIAGNOSIS — E7800 Pure hypercholesterolemia, unspecified: Secondary | ICD-10-CM

## 2019-09-05 DIAGNOSIS — I10 Essential (primary) hypertension: Secondary | ICD-10-CM | POA: Diagnosis not present

## 2019-09-05 MED ORDER — AMLODIPINE BESYLATE 5 MG PO TABS: 5.0000 mg | ORAL_TABLET | Freq: Every day | ORAL | 3 refills | Status: DC

## 2019-09-05 MED FILL — AMLODIPINE BESYLATE 5 MG TA: 5 | 90 days supply | Qty: 90 | Fill #0

## 2019-09-05 NOTE — Patient Instructions (Signed)
Medication Instructions:  No changes If you need a refill on your cardiac medications before your next appointment, please call your pharmacy.   Lab work: none If you have labs (blood work) drawn today and your tests are completely normal, you will receive your results only by: . MyChart Message (if you have MyChart) OR . A paper copy in the mail If you have any lab test that is abnormal or we need to change your treatment, we will call you to review the results.  Testing/Procedures: none  Follow-Up: At [facility], you and your health needs are our priority.  As part of our continuing mission to provide you with exceptional heart care, we have created designated Provider Care Teams.  These Care Teams include your primary Cardiologist (physician) and Advanced Practice Providers (APPs -  Physician Assistants and Nurse Practitioners) who all work together to provide you with the care you need, when you need it. You will need a follow up appointment in 12 months.  Please call our office 2 months in advance to schedule this appointment.  You may see Christopher McAlhany, MD or one of the following Advanced Practice Providers on your designated Care Team:   Brittainy Simmons, PA-C Dayna Dunn, PA-C . Michele Lenze, PA-C  Any Other Special Instructions Will Be Listed Below (If Applicable).    

## 2019-09-05 NOTE — Progress Notes (Addendum)
Chief Complaint  Patient presents with  . Follow-up    CAD    History of Present Illness: 63 yo male with history of CAD and hyperlipidemia here today for cardiac follow up. I met him in the cath lab in September 2015. He had an abnormal outpatient stress test arranged by primary care. During the GXT he had ST elevation in AVR, AVL but no chest pain with exercise. Cardiac cath 08/13/14 with mild disease in the LAD (20%) and RCA (20%). LV systolic function was normal.   He is here today for follow up. The patient denies any chest pain, dyspnea, palpitations, lower extremity edema, orthopnea, PND, dizziness, near syncope or syncope.    Primary Care Physician: Midge Minium, MD  Past Medical History:  Diagnosis Date  . Allergy   . CAD (coronary artery disease)    mild non-obstructive  . GERD (gastroesophageal reflux disease)   . Hyperlipidemia   . S/P right rotator cuff repair   . Sinusitis, bacterial   . Urinary incontinence     Past Surgical History:  Procedure Laterality Date  . COLONOSCOPY  04/08/2009  . LEFT HEART CATHETERIZATION WITH CORONARY ANGIOGRAM N/A 08/13/2014   Procedure: LEFT HEART CATHETERIZATION WITH CORONARY ANGIOGRAM;  Surgeon: Burnell Blanks, MD;  Location: [facility] CATH LAB;  Service: Cardiovascular;  Laterality: N/A;  . SHOULDER ARTHROSCOPY WITH ROTATOR CUFF REPAIR Right 10/26/2017   Procedure: RIGHT SHOULDER ARTHROSCOPY WITH ROTATOR CUFF REPAIR AND DEBRIDEMENT;  Surgeon: Sydnee Cabal, MD;  Location: [facility];  Service: Orthopedics;  Laterality: Right;  . SHOULDER SURGERY     Right shoulder     Current Outpatient Medications  Medication Sig Dispense Refill  . amLODipine (NORVASC) 5 MG tablet Take 1 tablet (5 mg total) by mouth daily. 90 tablet 3  . Ascorbic Acid (VITAMIN C) 1000 MG tablet Take 1,000 mg by mouth daily.    . ASPIRIN 81 PO Take 81 mg by mouth daily.    Marland Kitchen azelastine (ASTELIN) 0.1 % nasal spray Place 1 spray into  both nostrils 2 (two) times daily. Use in each nostril as directed 30 mL 0  . fexofenadine (ALLEGRA ALLERGY) 180 MG tablet Allegra Allergy    . fluticasone (FLONASE) 50 MCG/ACT nasal spray Place 2 sprays into both nostrils daily. 16 g 6  . Multiple Vitamin (MULTIVITAMIN WITH MINERALS) TABS tablet Take 1 tablet by mouth daily.    . Omega-3 Fatty Acids (FISH OIL) 1200 MG CAPS Take 1,200 mg by mouth 2 (two) times daily.    Marland Kitchen omeprazole (PRILOSEC) 20 MG capsule TAKE ONE CAPSULE BY MOUTH DAILY 90 capsule 3  . rosuvastatin (CRESTOR) 20 MG tablet Take 1 tablet (20 mg total) by mouth daily. Please make yearly appt with Dr. Angelena Form for August before anymore refills. 1st attempt 90 tablet 0  . valACYclovir (VALTREX) 1000 MG tablet Take 1,000 mg by mouth 2 (two) times daily. At the sight of a cold sore     No current facility-administered medications for this visit.     Allergies  Allergen Reactions  . Bee Venom Swelling    Social History   Socioeconomic History  . Marital status: Married    Spouse name: Not on file  . Number of children: Not on file  . Years of education: Not on file  . Highest education level: Not on file  Occupational History  . Occupation: Nurse, adult: [employer]  Social Needs  . Financial  resource strain: Not on file  . Food insecurity    Worry: Not on file    Inability: Not on file  . Transportation needs    Medical: Not on file    Non-medical: Not on file  Tobacco Use  . Smoking status: Never Smoker  . Smokeless tobacco: Never Used  Substance and Sexual Activity  . Alcohol use: Yes    Alcohol/week: 4.0 standard drinks    Types: 4 Standard drinks or equivalent per week    Comment: occ  . Drug use: No  . Sexual activity: Not on file  Lifestyle  . Physical activity    Days per week: Not on file    Minutes per session: Not on file  . Stress: Not on file  Relationships  . Social Herbalist on phone: Not on file    Gets  together: Not on file    Attends religious service: Not on file    Active member of club or organization: Not on file    Attends meetings of clubs or organizations: Not on file    Relationship status: Not on file  . Intimate partner violence    Fear of current or ex partner: Not on file    Emotionally abused: Not on file    Physically abused: Not on file    Forced sexual activity: Not on file  Other Topics Concern  . Not on file  Social History Narrative  . Not on file    Family History  Problem Relation Age of Onset  . Heart disease Father        MI age 34  . Colon cancer Neg Hx   . Esophageal cancer Neg Hx   . Rectal cancer Neg Hx   . Stomach cancer Neg Hx   . Colon polyps Neg Hx     Review of Systems:  As stated in the HPI and otherwise negative.   BP (!) 172/82   Pulse 78   Ht _0  (1.803 m)   Wt 200 lb 12.8 oz (91.1 kg)   SpO2 98%   BMI 28.01 kg/m   Physical Examination:  General: Well developed, well nourished, NAD  HEENT: OP clear, mucus membranes moist  SKIN: warm, dry. No rashes. Neuro: No focal deficits  Musculoskeletal: Muscle strength 5/5 all ext  Psychiatric: Mood and affect normal  Neck: No JVD, no carotid bruits, no thyromegaly, no lymphadenopathy.  Lungs:Clear bilaterally, no wheezes, rhonci, crackles Cardiovascular: Regular rate and rhythm. No murmurs, gallops or rubs. Abdomen:Soft. Bowel sounds present. Non-tender.  Extremities: No lower extremity edema. Pulses are 2 + in the bilateral DP/PT.  Cardiac cath 08/13/14: Hemodynamic Findings:  Central aortic pressure: 137/82  Left ventricular pressure: 144/8/14  Angiographic Findings:  Left main: No obstructive disease.  Left Anterior Descending Artery: Large caliber vessel that courses to the apex. There is a 20% stenosis in the mid vessel. There are mild luminal irregularities in the distal vessel. There are several small caliber diagonal branches.  Circumflex Artery: Large caliber vessel with  termination into an obtuse marginal branch. No obstructive disease.  Right Coronary Artery: Large dominant vessel with 20% proximal stenosis.  Left Ventricular Angiogram: LVEF=65-70%.   EKG:  EKG is ordered today. The ekg ordered today demonstrates NSR, rate 78 bpm. Non-specific ST abnormality. Unchanged  Recent Labs: 04/01/2019: ALT 31; BUN 16; Creatinine, Ser 1.08; Hemoglobin 14.4; Platelets 247.0; Potassium 4.0; Sodium 141; TSH 1.84   Lipid Panel    Component  Value Date/Time   CHOL 158 04/01/2019 0810   TRIG 111.0 04/01/2019 0810   TRIG 81 10/04/2006 1227   HDL 63.20 04/01/2019 0810   CHOLHDL 2 04/01/2019 0810   VLDL 22.2 04/01/2019 0810   LDLCALC 72 04/01/2019 0810   LDLDIRECT 120.9 03/16/2012 1054     Wt Readings from Last 3 Encounters:  09/05/19 200 lb 12.8 oz (91.1 kg)  05/27/19 195 lb (88.5 kg)  05/14/19 195 lb (88.5 kg)     Other studies Reviewed: Additional studies/ records that were reviewed today include: . Review of the above records demonstrates:    Assessment and Plan:   1. CAD without angina: He is known to have mild CAD by cath in September 2015. No chest pain. Continue ASA and statin.   2. HLD: Lipids followed in primary care. Continue statin  3. HTN:BP is elevated today but controlled at previous office visits here and in primary care. He will follow at home and let us know if it remains elevated. Continue Norvasc.   Current medicines are reviewed at length with the patient today.  The patient does not have concerns regarding medicines.  The following changes have been made:  no change  Labs/ tests ordered today include:   Orders Placed This Encounter  Procedures  . EKG 12-Lead    Disposition:   FU with me in 12  months  Signed, Lauree Chandler, MD 09/05/2019 4:05 PM    [facility] [address], [place], [place]  [address] Phone: [phone]; Fax: [phone]

## 2019-09-06 ENCOUNTER — Telehealth: Payer: Self-pay | Admitting: Cardiovascular Disease

## 2019-09-06 NOTE — Telephone Encounter (Signed)
Patient called to report BP elevated last evening 160, this am 165 but had not taken amlodipine yet.  Does not know diastolic numbers.   We discussed lifestyle factors (stress, stimulants, diet) that could affect blood pressure. At [facility] yesterday Dr. Angelena Form asked him to keep any eye on BP and call with update.   He will continue to check BP for a week and call back to report readings.

## 2019-09-06 NOTE — Telephone Encounter (Signed)
New message:    Patient calling stating that his BP came down to about 165 and he has some question concering his medication dosages. Please call patient back.

## 2019-09-08 NOTE — Telephone Encounter (Signed)
Thanks

## 2019-09-10 ENCOUNTER — Other Ambulatory Visit: Payer: Self-pay | Admitting: Cardiovascular Disease

## 2019-09-10 MED FILL — ROSUVASTATIN CALCIUM 20 MG: 20 | 90 days supply | Qty: 90 | Fill #0

## 2019-09-30 ENCOUNTER — Other Ambulatory Visit: Payer: Self-pay | Admitting: Family Medicine

## 2019-09-30 MED FILL — OMEPRAZOLE 20 MG CAP: 20 | 90 days supply | Qty: 90 | Fill #0

## 2019-10-04 ENCOUNTER — Ambulatory Visit (INDEPENDENT_AMBULATORY_CARE_PROVIDER_SITE_OTHER): Payer: BC Managed Care – PPO | Admitting: Family Medicine

## 2019-10-04 ENCOUNTER — Encounter: Payer: Self-pay | Admitting: Family Medicine

## 2019-10-04 ENCOUNTER — Other Ambulatory Visit: Payer: Self-pay

## 2019-10-04 VITALS — BP 136/82 | HR 80 | Temp 97.8°F | Resp 16 | Ht 71.0 in | Wt 198.1 lb

## 2019-10-04 DIAGNOSIS — Z125 Encounter for screening for malignant neoplasm of prostate: Secondary | ICD-10-CM

## 2019-10-04 DIAGNOSIS — I10 Essential (primary) hypertension: Secondary | ICD-10-CM | POA: Diagnosis not present

## 2019-10-04 DIAGNOSIS — Z0000 Encounter for general adult medical examination without abnormal findings: Secondary | ICD-10-CM

## 2019-10-04 DIAGNOSIS — E663 Overweight: Secondary | ICD-10-CM

## 2019-10-04 LAB — HEPATIC FUNCTION PANEL
ALT: 41 U/L (ref 0–53)
AST: 21 U/L (ref 0–37)
Albumin: 4.7 g/dL (ref 3.5–5.2)
Alkaline Phosphatase: 65 U/L (ref 39–117)
Bilirubin, Direct: 0.1 mg/dL (ref 0.0–0.3)
Total Bilirubin: 0.4 mg/dL (ref 0.2–1.2)
Total Protein: 7 g/dL (ref 6.0–8.3)

## 2019-10-04 LAB — CBC WITH DIFFERENTIAL/PLATELET
Basophils Absolute: 0.1 10*3/uL (ref 0.0–0.1)
Basophils Relative: 1.2 % (ref 0.0–3.0)
Eosinophils Absolute: 0.1 10*3/uL (ref 0.0–0.7)
Eosinophils Relative: 2 % (ref 0.0–5.0)
HCT: 44.7 % (ref 39.0–52.0)
Hemoglobin: 15 g/dL (ref 13.0–17.0)
Lymphocytes Relative: 29.7 % (ref 12.0–46.0)
Lymphs Abs: 1.3 10*3/uL (ref 0.7–4.0)
MCHC: 33.5 g/dL (ref 30.0–36.0)
MCV: 89.2 fl (ref 78.0–100.0)
Monocytes Absolute: 0.3 10*3/uL (ref 0.1–1.0)
Monocytes Relative: 6.5 % (ref 3.0–12.0)
Neutro Abs: 2.7 10*3/uL (ref 1.4–7.7)
Neutrophils Relative %: 60.6 % (ref 43.0–77.0)
Platelets: 278 10*3/uL (ref 150.0–400.0)
RBC: 5.01 Mil/uL (ref 4.22–5.81)
RDW: 13.2 % (ref 11.5–15.5)
WBC: 4.5 10*3/uL (ref 4.0–10.5)

## 2019-10-04 LAB — LIPID PANEL
Cholesterol: 192 mg/dL (ref 0–200)
HDL: 79.4 mg/dL (ref >39.00)
LDL Cholesterol: 92 mg/dL (ref 0–99)
NonHDL: 112.31
Total CHOL/HDL Ratio: 2
Triglycerides: 104 mg/dL (ref 0.0–149.0)
VLDL: 20.8 mg/dL (ref 0.0–40.0)

## 2019-10-04 LAB — BASIC METABOLIC PANEL
BUN: 20 mg/dL (ref 6–23)
CO2: 27 mEq/L (ref 19–32)
Calcium: 9.6 mg/dL (ref 8.4–10.5)
Chloride: 105 mEq/L (ref 96–112)
Creatinine, Ser: 0.96 mg/dL (ref 0.40–1.50)
GFR: 79.58 mL/min (ref >60.00)
Glucose, Bld: 96 mg/dL (ref 70–99)
Potassium: 4.3 mEq/L (ref 3.5–5.1)
Sodium: 142 mEq/L (ref 135–145)

## 2019-10-04 LAB — PSA: PSA: 2.17 ng/mL (ref 0.10–4.00)

## 2019-10-04 LAB — TSH: TSH: 1.55 u[IU]/mL (ref 0.35–4.50)

## 2019-10-04 MED ORDER — VALACYCLOVIR HCL 1 G PO TABS: 1000.0000 mg | ORAL_TABLET | Freq: Two times a day (BID) | ORAL | 0 refills | Status: DC

## 2019-10-04 MED FILL — valACYclovir HCL 1 GM TABS: 1 | 30 days supply | Qty: 60 | Fill #0

## 2019-10-04 NOTE — Assessment & Plan Note (Signed)
[name] PE WNL w/ exception of being mildly overweight.  UTD on colonoscopy.  UTD on immunizations.  Check labs.  Anticipatory guidance provided.

## 2019-10-04 NOTE — Assessment & Plan Note (Signed)
Chronic problem.  Adequate control, asymptomatic.  Pt will continue to check BP and adjust meds prn.

## 2019-10-04 NOTE — Progress Notes (Signed)
   Subjective:    Patient ID: Dan Sanchez, male    DOB: July 19, 1956, 63 y.o.   MRN: [id number]  HPI CPE- UTD on colonoscopy, Tdap, flu.    HTN- chronic problem, pt is concerned that BP is elevated.  Cards increased Amlodipine to 10mg  daily on 10/16.   Review of Systems Patient reports no vision/hearing changes, anorexia, fever ,adenopathy, persistant/recurrent hoarseness, swallowing issues, chest pain, palpitations, edema, persistant/recurrent cough, hemoptysis, dyspnea (rest,exertional, paroxysmal nocturnal), gastrointestinal  bleeding (melena, rectal bleeding), abdominal pain, excessive heart burn, GU symptoms (dysuria, hematuria, voiding/incontinence issues) syncope, focal weakness, memory loss, numbness & tingling, skin/hair/nail changes, depression, anxiety, abnormal bruising/bleeding, musculoskeletal symptoms/signs.     Objective:   Physical Exam BP 136/82   Pulse 80   Temp 97.8 F (36.6 C) (Tympanic)   Resp 16   Ht 5\' 11"  (1.803 m)   Wt 198 lb 2 oz (89.9 kg)   SpO2 98%   BMI 27.63 kg/m   General Appearance:    Alert, cooperative, no distress, appears stated age  Head:    Normocephalic, without obvious abnormality, atraumatic  Eyes:    PERRL, conjunctiva/corneas clear, EOM's intact, fundi    benign, both eyes       Ears:    Normal TM's and external ear canals, both ears  Nose:   Deferred due to COVID  Throat:   Neck:   Supple, symmetrical, trachea midline, no adenopathy;       thyroid:  No enlargement/tenderness/nodules  Back:     Symmetric, no curvature, ROM normal, no CVA tenderness  Lungs:     Clear to auscultation bilaterally, respirations unlabored  Chest wall:    No tenderness or deformity  Heart:    Regular rate and rhythm, S1 and S2 normal, no murmur, rub   or gallop  Abdomen:     Soft, non-tender, bowel sounds active all four quadrants,    no masses, no organomegaly  Genitalia:    Normal male without lesion, discharge or tenderness  Rectal:    Normal tone,  normal prostate, no masses or tenderness  Extremities:   Extremities normal, atraumatic, no cyanosis or edema  Pulses:   2+ and symmetric all extremities  Skin:   Skin color, texture, turgor normal, no rashes or lesions  Lymph nodes:   Cervical, supraclavicular, and axillary nodes normal  Neurologic:   CNII-XII intact. Normal strength, sensation and reflexes      throughout          Assessment & Plan:

## 2019-10-04 NOTE — Assessment & Plan Note (Signed)
Ongoing issue for pt.  Stressed need for healthy diet and regular exercise.  Will continue to follow. 

## 2019-10-04 NOTE — Patient Instructions (Addendum)
Follow up in 6 months to recheck BP and cholesterol We'll notify you of your lab results and make any changes if needed Check your BP periodically throughout the week (after medication) and if consistently higher than 140/90, let me know Continue to work on healthy diet and regular exercise- you can do it! Continue to take your allergy pills and use your nasal steroid If you spend any time outside, use a saline rinse when you come in to decrease the pollen and inflammation Call with any questions or concerns Stay Safe! Stay Healthy!

## 2019-10-05 ENCOUNTER — Encounter: Payer: Self-pay | Admitting: General Practice

## 2019-11-20 ENCOUNTER — Other Ambulatory Visit: Payer: Self-pay

## 2019-11-20 MED ORDER — AMLODIPINE BESYLATE 10 MG PO TABS: 10.0000 mg | ORAL_TABLET | Freq: Every day | ORAL | 3 refills | Status: DC

## 2019-11-20 MED ORDER — AMLODIPINE BESYLATE 10 MG PO TABS: 5.0000 mg | ORAL_TABLET | Freq: Every day | ORAL | 3 refills | Status: DC

## 2019-11-20 MED FILL — AMLODIPINE BESYLATE 10 MG T: 10 | 90 days supply | Qty: 90 | Fill #0

## 2019-12-04 MED FILL — ROSUVASTATIN CALCIUM 20 MG: 20 | 90 days supply | Qty: 90 | Fill #1

## 2019-12-05 ENCOUNTER — Other Ambulatory Visit: Payer: Self-pay

## 2019-12-05 ENCOUNTER — Encounter: Payer: Self-pay | Admitting: Family Medicine

## 2019-12-05 ENCOUNTER — Ambulatory Visit (INDEPENDENT_AMBULATORY_CARE_PROVIDER_SITE_OTHER): Payer: BC Managed Care – PPO | Admitting: Family Medicine

## 2019-12-05 DIAGNOSIS — B9689 Other specified bacterial agents as the cause of diseases classified elsewhere: Secondary | ICD-10-CM

## 2019-12-05 DIAGNOSIS — J019 Acute sinusitis, unspecified: Secondary | ICD-10-CM

## 2019-12-05 DIAGNOSIS — Z20822 Contact with and (suspected) exposure to covid-19: Secondary | ICD-10-CM

## 2019-12-05 MED ORDER — AMOXICILLIN 875 MG PO TABS: 875.0000 mg | ORAL_TABLET | Freq: Two times a day (BID) | ORAL | 0 refills | Status: DC

## 2019-12-05 MED FILL — AMOXICILLIN 875 MG TABS: 875 | 10 days supply | Qty: 20 | Fill #0

## 2019-12-05 NOTE — Progress Notes (Signed)
Virtual Visit via Video   I connected with patient on 12/05/19 at  3:00 PM EST by a video enabled telemedicine application and verified that I am speaking with the correct person using two identifiers.  Location patient: Home Location provider: Acup[facility]urist, Office Persons participating in the virtual visit: Patient, Provider, [place] (Jess B)  I discussed the limitations of evaluation and management by telemedicine and the availability of in person appointments. The patient expressed understanding and agreed to proceed.  Subjective:   HPI:   URI- sxs started Saturday.  + nasal congestion, head congestion, HA, productive cough.  No fever.  Taking Nyquil and Dayquil.  + chills- 'I just can't seem to get warm'.  No changes to taste or smell.  Had COVID exposure on Tuesday.  + chills.    ROS:   See pertinent positives and negatives per HPI.  Patient Active Problem List   Diagnosis Date Noted  . HTN (hypertension) 03/29/2019  . Overweight (BMI 25.0-29.9) 03/29/2019  . Nasal septal deviation 02/01/2019  . S/P right rotator cuff repair 10/26/2017  . Contact dermatitis 08/27/2015  . HSV-1 (herpes simplex virus 1) infection 08/27/2015  . Bee sting allergy 09/10/2014  . Chest pain 08/11/2014  . Allergic rhinitis 08/10/2014  . Allergic conjunctivitis 02/20/2014  . Insomnia 02/03/2014  . GERD (gastroesophageal reflux disease) 02/03/2014  . General medical examination 03/16/2012  . Hyperlipidemia 12/24/2007  . ERECTILE DYSFUNCTION 12/24/2007    Social History   Tobacco Use  . Smoking status: Never Smoker  . Smokeless tobacco: Never Used  Substance Use Topics  . Alcohol use: Yes    Alcohol/week: 4.0 standard drinks    Types: 4 Standard drinks or equivalent per week    Comment: occ    Current Outpatient Medications:  .  amLODipine (NORVASC) 10 MG tablet, Take 1 tablet (10 mg total) by mouth daily., Disp: 90 tablet, Rfl: 3 .  Ascorbic Acid (VITAMIN C) 1000 MG tablet, Take  1,000 mg by mouth daily., Disp: , Rfl:  .  ASPIRIN 81 PO, Take 81 mg by mouth daily., Disp: , Rfl:  .  azelastine (ASTELIN) 0.1 % nasal spray, Place 1 spray into both nostrils 2 (two) times daily. Use in each nostril as directed, Disp: 30 mL, Rfl: 0 .  fexofenadine (ALLEGRA ALLERGY) 180 MG tablet, Allegra Allergy, Disp: , Rfl:  .  fluticasone (FLONASE) 50 MCG/ACT nasal spray, Place 2 sprays into both nostrils daily., Disp: 16 g, Rfl: 6 .  Multiple Vitamin (MULTIVITAMIN WITH MINERALS) TABS tablet, Take 1 tablet by mouth daily., Disp: , Rfl:  .  Omega-3 Fatty Acids (FISH OIL) 1200 MG CAPS, Take 1,200 mg by mouth 2 (two) times daily., Disp: , Rfl:  .  omeprazole (PRILOSEC) 20 MG capsule, TAKE ONE CAPSULE BY MOUTH DAILY, Disp: 90 capsule, Rfl: 1 .  rosuvastatin (CRESTOR) 20 MG tablet, Take 1 tablet (20 mg total) by mouth daily., Disp: 90 tablet, Rfl: 3 .  valACYclovir (VALTREX) 1000 MG tablet, Take 1 tablet (1,000 mg total) by mouth 2 (two) times daily. At the sight of a cold sore, Disp: 60 tablet, Rfl: 0  Allergies  Allergen Reactions  . Bee Venom Swelling    Objective:   There were no vitals taken for this visit.  AAOx3, NAD NCAT, EOMI No obvious CN deficits Coloring WNL Pt is able to speak clearly, coherently without shortness of breath or increased work of breathing.  Thought process is linear.  Mood is appropriate.   Assessment  and Plan:   Bacterial sinusitis- pt has hx of this.  However, he has a known COVID exposure and chills- which is different than his typical presentation.  Will start antibiotics but encouraged him to get repeat COVID testing.  COVID exposure- pt was exposed on 12/29.  He was tested on 12/31- negative.  Developed sxs on 1/2.  Explained to him that his test likely wasn't valid b/c it was too close to his exposure.  His HA, nasal and head congestion, productive cough, and chills are all consistent w/ possible COVID.  I told him we would do abx for possible  sinusitis but that I wanted him to get retested for COVID.  He argued that this was not necessary and it wouldn't change the plan.  I told him it would allow [place] to adjust our expectations, contact trace, and not give him additional abx if his sxs don't improve (b/c it would be viral).  He still did not see the point.  I told him it was a public health issue and that I need him to get repeat testing.  After much discussion, he said that he would.  I gave him the information for how to schedule testing.  Reviewed supportive care and red flags that should prompt return.  Pt expressed understanding and is in agreement w/ plan.    Annye Asa, MD 12/05/2019

## 2019-12-05 NOTE — Progress Notes (Signed)
I have discussed the procedure for the virtual visit with the patient who has given consent to proceed with assessment and treatment.   Pt unable to obtain vitals.   Keiarah Orlowski L Latecia Miler, CMA     

## 2019-12-06 ENCOUNTER — Ambulatory Visit: Payer: BC Managed Care – PPO | Attending: Internal Medicine

## 2019-12-06 ENCOUNTER — Encounter (INDEPENDENT_AMBULATORY_CARE_PROVIDER_SITE_OTHER): Payer: Self-pay

## 2019-12-06 DIAGNOSIS — Z20822 Contact with and (suspected) exposure to covid-19: Secondary | ICD-10-CM

## 2019-12-08 ENCOUNTER — Encounter: Payer: Self-pay | Admitting: Family Medicine

## 2019-12-08 LAB — NOVEL CORONAVIRUS, NAA: SARS-CoV-2, NAA: DETECTED — AB

## 2019-12-09 ENCOUNTER — Encounter (INDEPENDENT_AMBULATORY_CARE_PROVIDER_SITE_OTHER): Payer: Self-pay

## 2019-12-09 ENCOUNTER — Encounter: Payer: Self-pay | Admitting: Family Medicine

## 2019-12-18 ENCOUNTER — Encounter: Payer: Self-pay | Admitting: Family Medicine

## 2020-01-02 MED FILL — OMEPRAZOLE 20 MG CAP: 20 | 90 days supply | Qty: 90 | Fill #1

## 2020-02-27 MED FILL — AMLODIPINE BESYLATE 10 MG T: 10 | 90 days supply | Qty: 90 | Fill #1

## 2020-02-27 MED FILL — ROSUVASTATIN CALCIUM 20 MG: 20 | 90 days supply | Qty: 90 | Fill #2

## 2020-04-01 ENCOUNTER — Other Ambulatory Visit: Payer: Self-pay | Admitting: Family Medicine

## 2020-04-01 MED FILL — OMEPRAZOLE 20 MG CAP: 20 | 90 days supply | Qty: 90 | Fill #0

## 2020-06-03 MED FILL — AMLODIPINE BESYLATE 10 MG T: 10 | 90 days supply | Qty: 90 | Fill #2

## 2020-06-15 MED FILL — ROSUVASTATIN CALCIUM 20 MG: 20 | 90 days supply | Qty: 90 | Fill #3

## 2020-07-02 MED FILL — OMEPRAZOLE 20 MG CAP: 20 | 90 days supply | Qty: 90 | Fill #1

## 2020-08-31 ENCOUNTER — Other Ambulatory Visit: Payer: Self-pay | Admitting: Physician Assistant

## 2020-08-31 DIAGNOSIS — J019 Acute sinusitis, unspecified: Secondary | ICD-10-CM

## 2020-08-31 MED FILL — AMLODIPINE BESYLATE 10 MG T: 10 | 90 days supply | Qty: 90 | Fill #3

## 2020-09-11 ENCOUNTER — Other Ambulatory Visit: Payer: Self-pay | Admitting: Cardiovascular Disease

## 2020-09-11 MED FILL — ROSUVASTATIN CALCIUM 20 MG: 20 | 90 days supply | Qty: 90 | Fill #0

## 2020-09-25 ENCOUNTER — Other Ambulatory Visit: Payer: Self-pay | Admitting: Family Medicine

## 2020-09-25 MED FILL — OMEPRAZOLE 20 MG CAP: 20 | 90 days supply | Qty: 90 | Fill #0

## 2020-10-09 ENCOUNTER — Encounter: Payer: Self-pay | Admitting: Family Medicine

## 2020-10-09 ENCOUNTER — Ambulatory Visit (INDEPENDENT_AMBULATORY_CARE_PROVIDER_SITE_OTHER): Payer: BC Managed Care – PPO | Admitting: Family Medicine

## 2020-10-09 ENCOUNTER — Other Ambulatory Visit: Payer: Self-pay

## 2020-10-09 VITALS — BP 130/80 | HR 73 | Temp 98.0°F | Resp 17 | Ht 70.0 in | Wt 197.2 lb

## 2020-10-09 DIAGNOSIS — I10 Essential (primary) hypertension: Secondary | ICD-10-CM

## 2020-10-09 DIAGNOSIS — Z0000 Encounter for general adult medical examination without abnormal findings: Secondary | ICD-10-CM | POA: Diagnosis not present

## 2020-10-09 DIAGNOSIS — Z125 Encounter for screening for malignant neoplasm of prostate: Secondary | ICD-10-CM | POA: Diagnosis not present

## 2020-10-09 DIAGNOSIS — Z23 Encounter for immunization: Secondary | ICD-10-CM

## 2020-10-09 LAB — LIPID PANEL
Cholesterol: 181 mg/dL (ref 0–200)
HDL: 66 mg/dL (ref >39.00)
LDL Cholesterol: 80 mg/dL (ref 0–99)
NonHDL: 115.1
Total CHOL/HDL Ratio: 3
Triglycerides: 174 mg/dL — ABNORMAL HIGH (ref 0.0–149.0)
VLDL: 34.8 mg/dL (ref 0.0–40.0)

## 2020-10-09 LAB — TSH: TSH: 1.85 u[IU]/mL (ref 0.35–4.50)

## 2020-10-09 LAB — BASIC METABOLIC PANEL
BUN: 19 mg/dL (ref 6–23)
CO2: 30 mEq/L (ref 19–32)
Calcium: 9.6 mg/dL (ref 8.4–10.5)
Chloride: 103 mEq/L (ref 96–112)
Creatinine, Ser: 1.13 mg/dL (ref 0.40–1.50)
GFR: 69.8 mL/min (ref >60.00)
Glucose, Bld: 92 mg/dL (ref 70–99)
Potassium: 4.2 mEq/L (ref 3.5–5.1)
Sodium: 141 mEq/L (ref 135–145)

## 2020-10-09 LAB — CBC WITH DIFFERENTIAL/PLATELET
Basophils Absolute: 0.1 10*3/uL (ref 0.0–0.1)
Basophils Relative: 1.1 % (ref 0.0–3.0)
Eosinophils Absolute: 0.1 10*3/uL (ref 0.0–0.7)
Eosinophils Relative: 2.9 % (ref 0.0–5.0)
HCT: 44 % (ref 39.0–52.0)
Hemoglobin: 15.3 g/dL (ref 13.0–17.0)
Lymphocytes Relative: 32.8 % (ref 12.0–46.0)
Lymphs Abs: 1.5 10*3/uL (ref 0.7–4.0)
MCHC: 34.8 g/dL (ref 30.0–36.0)
MCV: 86.6 fl (ref 78.0–100.0)
Monocytes Absolute: 0.4 10*3/uL (ref 0.1–1.0)
Monocytes Relative: 8.1 % (ref 3.0–12.0)
Neutro Abs: 2.4 10*3/uL (ref 1.4–7.7)
Neutrophils Relative %: 55.1 % (ref 43.0–77.0)
Platelets: 265 10*3/uL (ref 150.0–400.0)
RBC: 5.08 Mil/uL (ref 4.22–5.81)
RDW: 13.1 % (ref 11.5–15.5)
WBC: 4.5 10*3/uL (ref 4.0–10.5)

## 2020-10-09 LAB — HEPATIC FUNCTION PANEL
ALT: 38 U/L (ref 0–53)
AST: 22 U/L (ref 0–37)
Albumin: 4.7 g/dL (ref 3.5–5.2)
Alkaline Phosphatase: 65 U/L (ref 39–117)
Bilirubin, Direct: 0.1 mg/dL (ref 0.0–0.3)
Total Bilirubin: 0.5 mg/dL (ref 0.2–1.2)
Total Protein: 7.1 g/dL (ref 6.0–8.3)

## 2020-10-09 LAB — PSA: PSA: 1.77 ng/mL (ref 0.10–4.00)

## 2020-10-09 NOTE — Assessment & Plan Note (Signed)
Ongoing issue for pt.  BP is adequately controlled.  Asymptomatic.  Check labs.  Will follow.

## 2020-10-09 NOTE — Progress Notes (Signed)
   Subjective:    Patient ID: Dan Sanchez, male    DOB: 1956/08/13, 64 y.o.   MRN: [id number]  HPI CPE- UTD on colonoscopy, Tdap, COVID.  Will get flu today.  Reviewed past medical, surgical, family and social histories.   Patient Care Team    Relationship Specialty Notifications Start End  Midge Minium, MD PCP - General   11/10/10    Comment: Rosalva Ferron, MD PCP - Cardiology Cardiology Admissions 07/16/18   Sharmon Revere Physician Assistant Physician Assistant  04/06/12   Burnell Blanks, MD Consulting Physician Cardiology  07/15/15     Health Maintenance  Topic Date Due  . INFLUENZA VACCINE  06/28/2020  . Hepatitis C Screening  10/09/2021 (Originally 02/04/1956)  . HIV Screening  10/09/2021 (Originally 07/23/1973)  . TETANUS/TDAP  09/24/2028  . COLONOSCOPY  05/26/2029  . COVID-19 Vaccine  Completed     Review of Systems Patient reports no vision/hearing changes, anorexia, fever ,adenopathy, persistant/recurrent hoarseness, swallowing issues, chest pain, palpitations, edema, persistant/recurrent cough, hemoptysis, dyspnea (rest,exertional, paroxysmal nocturnal), gastrointestinal  bleeding (melena, rectal bleeding), abdominal pain, excessive heart burn, GU symptoms (dysuria, hematuria, voiding/incontinence issues) syncope, focal weakness, memory loss, numbness & tingling, skin/hair/nail changes, depression, anxiety, abnormal bruising/bleeding, musculoskeletal symptoms/signs.   This visit occurred during the SARS-CoV-2 public health emergency.  Safety protocols were in place, including screening questions prior to the visit, additional usage of staff PPE, and extensive cleaning of exam room while observing appropriate contact time as indicated for disinfecting solutions.       Objective:   Physical Exam General Appearance:    Alert, cooperative, no distress, appears stated age  Head:    Normocephalic, without obvious abnormality, atraumatic    Eyes:    PERRL, conjunctiva/corneas clear, EOM's intact, fundi    benign, both eyes       Ears:    Normal TM's and external ear canals, both ears  Nose:   Deferred due to COVID  Throat:   Neck:   Supple, symmetrical, trachea midline, no adenopathy;       thyroid:  No enlargement/tenderness/nodules  Back:     Symmetric, no curvature, ROM normal, no CVA tenderness  Lungs:     Clear to auscultation bilaterally, respirations unlabored  Chest wall:    No tenderness or deformity  Heart:    Regular rate and rhythm, S1 and S2 normal, no murmur, rub   or gallop  Abdomen:     Soft, non-tender, bowel sounds active all four quadrants,    no masses, no organomegaly  Genitalia:    Normal male without lesion, masses,discharge or tenderness  Rectal:    Deferred due to young age  Extremities:   Extremities normal, atraumatic, no cyanosis or edema  Pulses:   2+ and symmetric all extremities  Skin:   Skin color, texture, turgor normal, no rashes or lesions  Lymph nodes:   Cervical, supraclavicular, and axillary nodes normal  Neurologic:   CNII-XII intact. Normal strength, sensation and reflexes      throughout          Assessment & Plan:

## 2020-10-09 NOTE — Patient Instructions (Signed)
Follow up in 6 months to recheck BP and cholesterol We'll notify you of your lab results and make any changes if needed Continue to work on healthy diet and regular exercise- you can do it!! Call with any questions or concerns Stay Safe!  Stay Healthy! Happy Holidays!!! 

## 2020-10-09 NOTE — Assessment & Plan Note (Signed)
[name] PE WNL.  UTD on colonoscopy, Tdap, COVID vaccines.  Flu shot given today.  Check labs.  Anticipatory guidance provided.

## 2020-10-26 ENCOUNTER — Encounter: Payer: Self-pay | Admitting: Cardiovascular Disease

## 2020-10-26 ENCOUNTER — Other Ambulatory Visit: Payer: Self-pay

## 2020-10-26 ENCOUNTER — Ambulatory Visit: Payer: BC Managed Care – PPO | Admitting: Cardiovascular Disease

## 2020-10-26 VITALS — BP 146/82 | HR 69 | Ht 70.5 in | Wt 201.4 lb

## 2020-10-26 DIAGNOSIS — I10 Essential (primary) hypertension: Secondary | ICD-10-CM

## 2020-10-26 DIAGNOSIS — E7800 Pure hypercholesterolemia, unspecified: Secondary | ICD-10-CM | POA: Diagnosis not present

## 2020-10-26 DIAGNOSIS — I2510 Atherosclerotic heart disease of native coronary artery without angina pectoris: Secondary | ICD-10-CM | POA: Diagnosis not present

## 2020-10-26 MED ORDER — ASPIRIN EC 81 MG PO TBEC: 81.0000 mg | DELAYED_RELEASE_TABLET | Freq: Every day | ORAL | 3 refills | Status: AC

## 2020-10-26 NOTE — Progress Notes (Signed)
Chief Complaint  Patient presents with  . Follow-up    CAD   History of Present Illness: 64 yo male with history of CAD and hyperlipidemia here today for cardiac follow up. He had an abnormal stress test in 2015 with ST elevation in AVR, AVL but no chest pain with exercise. Cardiac cath 08/13/14 with mild disease in the LAD (20%) and RCA (20%). LV systolic function was normal.   He is here today for follow up. The patient denies any chest pain, dyspnea, palpitations, lower extremity edema, orthopnea, PND, dizziness, near syncope or syncope.   Primary Care Physician: Midge Minium, MD  Past Medical History:  Diagnosis Date  . Allergy   . CAD (coronary artery disease)    mild non-obstructive  . GERD (gastroesophageal reflux disease)   . Hyperlipidemia   . S/P right rotator cuff repair   . Sinusitis, bacterial   . Urinary incontinence     Past Surgical History:  Procedure Laterality Date  . COLONOSCOPY  04/08/2009  . LEFT HEART CATHETERIZATION WITH CORONARY ANGIOGRAM N/A 08/13/2014   Procedure: LEFT HEART CATHETERIZATION WITH CORONARY ANGIOGRAM;  Surgeon: Burnell Blanks, MD;  Location: [facility] CATH LAB;  Service: Cardiovascular;  Laterality: N/A;  . SHOULDER ARTHROSCOPY WITH ROTATOR CUFF REPAIR Right 10/26/2017   Procedure: RIGHT SHOULDER ARTHROSCOPY WITH ROTATOR CUFF REPAIR AND DEBRIDEMENT;  Surgeon: Sydnee Cabal, MD;  Location: [facility];  Service: Orthopedics;  Laterality: Right;  . SHOULDER SURGERY     Right shoulder     Current Outpatient Medications  Medication Sig Dispense Refill  . amLODipine (NORVASC) 10 MG tablet Take 1 tablet (10 mg total) by mouth daily. 90 tablet 3  . Ascorbic Acid (VITAMIN C) 1000 MG tablet Take 1,000 mg by mouth daily.    . fexofenadine (ALLEGRA ALLERGY) 180 MG tablet Allegra Allergy    . fluticasone (FLONASE) 50 MCG/ACT nasal spray Place 2 sprays into both nostrils daily. 16 g 6  . Omega-3 Fatty Acids (FISH OIL)  1200 MG CAPS Take 1,200 mg by mouth 2 (two) times daily.    Marland Kitchen omeprazole (PRILOSEC) 20 MG capsule TAKE ONE CAPSULE BY MOUTH DAILY 90 capsule 1  . rosuvastatin (CRESTOR) 20 MG tablet TAKE 1 TABLET (20 MG TOTAL) BY MOUTH DAILY. 90 tablet 3  . aspirin EC 81 MG tablet Take 1 tablet (81 mg total) by mouth daily. Swallow whole. 90 tablet 3   No current facility-administered medications for this visit.    Allergies  Allergen Reactions  . Bee Venom Swelling    Social History   Socioeconomic History  . Marital status: Married    Spouse name: Not on file  . Number of children: Not on file  . Years of education: Not on file  . Highest education level: Not on file  Occupational History  . Occupation: Nurse, adult: [employer]  Tobacco Use  . Smoking status: Never Smoker  . Smokeless tobacco: Never Used  Vaping Use  . Vaping Use: Never used  Substance and Sexual Activity  . Alcohol use: Yes    Alcohol/week: 4.0 standard drinks    Types: 4 Standard drinks or equivalent per week    Comment: occ  . Drug use: No  . Sexual activity: Not on file  Other Topics Concern  . Not on file  Social History Narrative  . Not on file   Social Determinants of Health   Financial Resource Strain:   .  Difficulty of Paying Living Expenses: Not on file  Food Insecurity:   . Worried About Charity fundraiser in the Last Year: Not on file  . Ran Out of Food in the Last Year: Not on file  Transportation Needs:   . Lack of Transportation (Medical): Not on file  . Lack of Transportation (Non-Medical): Not on file  Physical Activity:   . Days of Exercise per Week: Not on file  . Minutes of Exercise per Session: Not on file  Stress:   . Feeling of Stress : Not on file  Social Connections:   . Frequency of Communication with Friends and Family: Not on file  . Frequency of Social Gatherings with Friends and Family: Not on file  . Attends Religious Services: Not on file  . Active  Member of Clubs or Organizations: Not on file  . Attends Archivist Meetings: Not on file  . Marital Status: Not on file  Intimate Partner Violence:   . Fear of Current or Ex-Partner: Not on file  . Emotionally Abused: Not on file  . Physically Abused: Not on file  . Sexually Abused: Not on file    Family History  Problem Relation Age of Onset  . Heart disease Father        MI age 17  . Colon cancer Neg Hx   . Esophageal cancer Neg Hx   . Rectal cancer Neg Hx   . Stomach cancer Neg Hx   . Colon polyps Neg Hx     Review of Systems:  As stated in the HPI and otherwise negative.   BP (!) 146/82   Pulse 69   Ht 5' 10.5" (1.791 m)   Wt 201 lb 6.4 oz (91.4 kg)   SpO2 98%   BMI 28.49 kg/m   Physical Examination:  General: Well developed, well nourished, NAD  HEENT: OP clear, mucus membranes moist  SKIN: warm, dry. No rashes. Neuro: No focal deficits  Musculoskeletal: Muscle strength 5/5 all ext  Psychiatric: Mood and affect normal  Neck: No JVD, no carotid bruits, no thyromegaly, no lymphadenopathy.  Lungs:Clear bilaterally, no wheezes, rhonci, crackles Cardiovascular: Regular rate and rhythm. No murmurs, gallops or rubs. Abdomen:Soft. Bowel sounds present. Non-tender.  Extremities: No lower extremity edema. Pulses are 2 + in the bilateral DP/PT.  Cardiac cath 08/13/14: Hemodynamic Findings:  Central aortic pressure: 137/82  Left ventricular pressure: 144/8/14  Angiographic Findings:  Left main: No obstructive disease.  Left Anterior Descending Artery: Large caliber vessel that courses to the apex. There is a 20% stenosis in the mid vessel. There are mild luminal irregularities in the distal vessel. There are several small caliber diagonal branches.  Circumflex Artery: Large caliber vessel with termination into an obtuse marginal branch. No obstructive disease.  Right Coronary Artery: Large dominant vessel with 20% proximal stenosis.  Left Ventricular  Angiogram: LVEF=65-70%.   EKG:  EKG is ordered today. The ekg ordered today demonstrates Sinus  Recent Labs: 10/09/2020: ALT 38; BUN 19; Creatinine, Ser 1.13; Hemoglobin 15.3; Platelets 265.0; Potassium 4.2; Sodium 141; TSH 1.85   Lipid Panel    Component Value Date/Time   CHOL 181 10/09/2020 0835   TRIG 174.0 (H) 10/09/2020 0835   TRIG 81 10/04/2006 1227   HDL 66.00 10/09/2020 0835   CHOLHDL 3 10/09/2020 0835   VLDL 34.8 10/09/2020 0835   LDLCALC 80 10/09/2020 0835   LDLDIRECT 120.9 03/16/2012 1054     Wt Readings from Last 3 Encounters:  10/26/20  201 lb 6.4 oz (91.4 kg)  10/09/20 197 lb 3.2 oz (89.4 kg)  10/04/19 198 lb 2 oz (89.9 kg)     Other studies Reviewed: Additional studies/ records that were reviewed today include: . Review of the above records demonstrates:    Assessment and Plan:   1. CAD without angina: He is known to have mild CAD by cath in September 2015. He has no chest pain. Will continue ASA and statin.    2. HLD: Lipids followed in primary care. LDL near goal in November 2021. Continue statin  3. HTN:BP is well controlled at home at at primary care visit last week. Continue current therapy.   Current medicines are reviewed at length with the patient today.  The patient does not have concerns regarding medicines.  The following changes have been made:  no change  Labs/ tests ordered today include:   Orders Placed This Encounter  Procedures  . EKG 12-Lead    Disposition:   FU with me in 12  months  Signed, Lauree Chandler, MD 10/26/2020 8:46 AM    [facility] [address], [place], [place]  [address] Phone: [phone]; Fax: [phone]

## 2020-10-26 NOTE — Patient Instructions (Signed)
Medication Instructions:  Your physician recommends that you continue on your current medications as directed. Please refer to the Current Medication list given to you today.  *If you need a refill on your cardiac medications before your next appointment, please call your pharmacy*   Follow-Up: At [facility], you and your health needs are our priority.  As part of our continuing mission to provide you with exceptional heart care, we have created designated Provider Care Teams.  These Care Teams include your primary Cardiologist (physician) and Advanced Practice Providers (APPs -  Physician Assistants and Nurse Practitioners) who all work together to provide you with the care you need, when you need it.  We recommend signing up for the patient portal called "MyChart".  Sign up information is provided on this After Visit Summary.  MyChart is used to connect with patients for Virtual Visits (Telemedicine).  Patients are able to view lab/test results, encounter notes, upcoming appointments, etc.  Non-urgent messages can be sent to your provider as well.   To learn more about what you can do with MyChart, go to [email or web address].    Your next appointment:   1 year(s)  The format for your next appointment:   In Person  Provider:   You may see Lauree Chandler, MD or one of the following Advanced Practice Providers on your designated Care Team:    Melina Copa, PA-C  Ermalinda Barrios, PA-C

## 2020-11-24 ENCOUNTER — Other Ambulatory Visit: Payer: Self-pay | Admitting: Cardiovascular Disease

## 2020-11-24 MED FILL — AMLODIPINE BESYLATE 10 MG T: 10 | 90 days supply | Qty: 90 | Fill #0

## 2020-11-24 MED FILL — ROSUVASTATIN CALCIUM 20 MG: 20 | 90 days supply | Qty: 90 | Fill #1

## 2021-01-07 MED FILL — OMEPRAZOLE 20 MG CAP: 20 | 90 days supply | Qty: 90 | Fill #1

## 2021-02-04 ENCOUNTER — Ambulatory Visit: Payer: BC Managed Care – PPO | Admitting: Family Medicine

## 2021-02-04 ENCOUNTER — Other Ambulatory Visit: Payer: Self-pay | Admitting: Family Medicine

## 2021-02-04 ENCOUNTER — Other Ambulatory Visit: Payer: Self-pay

## 2021-02-04 VITALS — BP 140/78 | Ht 70.5 in | Wt 201.0 lb

## 2021-02-04 DIAGNOSIS — M5416 Radiculopathy, lumbar region: Secondary | ICD-10-CM

## 2021-02-04 MED ORDER — PREDNISONE 5 MG PO TABS: ORAL_TABLET | ORAL | 0 refills | Status: DC

## 2021-02-04 MED FILL — predniSONE 5 MG TABS: 5 | 6 days supply | Qty: 21 | Fill #0

## 2021-02-04 NOTE — Assessment & Plan Note (Signed)
Has symptoms consistent with his prior episode in 2017.  - counseled on home exercise therapy and supportive care  - prednisone  - could consider PT or epidural.

## 2021-02-04 NOTE — Patient Instructions (Signed)
Nice to meet you Please try the prednisone  Please try the exercises  Please keep me updated   Please send me a message in MyChart with any questions or updates.  Please see me back in 4 weeks.   --Dr. Raeford Razor

## 2021-02-04 NOTE — Progress Notes (Signed)
Dan Sanchez - 65 y.o. male MRN [id number]  Date of birth: [birth year]  SUBJECTIVE:  Including CC & ROS.  No chief complaint on file.   Dan Sanchez is a 65 y.o. male that is presenting with low back pain with intermittent radicular type pain.  This is pain that is similar to his previous instance back in 2017.  He was able to get the pain under control with a few epidurals.  Denies any injury or inciting event.   Review of Systems See HPI   HISTORY: Past Medical, Surgical, Social, and Family History Reviewed & Updated per EMR.   Pertinent Historical Findings include:  Past Medical History:  Diagnosis Date  . Allergy   . CAD (coronary artery disease)    mild non-obstructive  . GERD (gastroesophageal reflux disease)   . Hyperlipidemia   . S/P right rotator cuff repair   . Sinusitis, bacterial   . Urinary incontinence     Past Surgical History:  Procedure Laterality Date  . COLONOSCOPY  04/08/2009  . LEFT HEART CATHETERIZATION WITH CORONARY ANGIOGRAM N/A 08/13/2014   Procedure: LEFT HEART CATHETERIZATION WITH CORONARY ANGIOGRAM;  Surgeon: Burnell Blanks, MD;  Location: [facility] CATH LAB;  Service: Cardiovascular;  Laterality: N/A;  . SHOULDER ARTHROSCOPY WITH ROTATOR CUFF REPAIR Right 10/26/2017   Procedure: RIGHT SHOULDER ARTHROSCOPY WITH ROTATOR CUFF REPAIR AND DEBRIDEMENT;  Surgeon: Sydnee Cabal, MD;  Location: [place];  Service: Orthopedics;  Laterality: Right;  . SHOULDER SURGERY     Right shoulder     Family History  Problem Relation Age of Onset  . Heart disease Father        MI age 58  . Colon cancer Neg Hx   . Esophageal cancer Neg Hx   . Rectal cancer Neg Hx   . Stomach cancer Neg Hx   . Colon polyps Neg Hx     Social History   Socioeconomic History  . Marital status: Married    Spouse name: Not on file  . Number of children: Not on file  . Years of education: Not on file  . Highest education level: Not on file  Occupational  History  . Occupation: Nurse, adult: [employer]  Tobacco Use  . Smoking status: Never Smoker  . Smokeless tobacco: Never Used  Vaping Use  . Vaping Use: Never used  Substance and Sexual Activity  . Alcohol use: Yes    Alcohol/week: 4.0 standard drinks    Types: 4 Standard drinks or equivalent per week    Comment: occ  . Drug use: No  . Sexual activity: Not on file  Other Topics Concern  . Not on file  Social History Narrative  . Not on file   Social Determinants of Health   Financial Resource Strain: Not on file  Food Insecurity: Not on file  Transportation Needs: Not on file  Physical Activity: Not on file  Stress: Not on file  Social Connections: Not on file  Intimate Partner Violence: Not on file     PHYSICAL EXAM:  VS: BP 140/78 (BP Location: Left Arm, Patient Position: Sitting, Cuff Size: Large)   Ht 5' 10.5" (1.791 m)   Wt 201 lb (91.2 kg)   BMI 28.43 kg/m  Physical Exam Gen: NAD, alert, cooperative with exam, well-appearing MSK:  Neck: Normal range of motion. Normal strength resistance. Negative straight leg testing. Neurovascular intact     ASSESSMENT & PLAN:   Lumbar radiculopathy  Has symptoms consistent with his prior episode in 2017.  - counseled on home exercise therapy and supportive care  - prednisone  - could consider PT or epidural.

## 2021-02-21 ENCOUNTER — Encounter: Payer: Self-pay | Admitting: Family Medicine

## 2021-03-01 ENCOUNTER — Other Ambulatory Visit (HOSPITAL_BASED_OUTPATIENT_CLINIC_OR_DEPARTMENT_OTHER): Payer: Self-pay

## 2021-03-01 MED FILL — Amlodipine Besylate Tab 10 MG (Base Equivalent): ORAL | 90 days supply | Qty: 90 | Fill #0 | Status: AC

## 2021-03-02 ENCOUNTER — Other Ambulatory Visit (HOSPITAL_BASED_OUTPATIENT_CLINIC_OR_DEPARTMENT_OTHER): Payer: Self-pay

## 2021-03-02 ENCOUNTER — Encounter: Payer: Self-pay | Admitting: Family Medicine

## 2021-03-02 MED FILL — Rosuvastatin Calcium Tab 20 MG: ORAL | 90 days supply | Qty: 90 | Fill #0 | Status: AC

## 2021-03-03 ENCOUNTER — Other Ambulatory Visit: Payer: Self-pay | Admitting: Family Medicine

## 2021-03-03 DIAGNOSIS — M5416 Radiculopathy, lumbar region: Secondary | ICD-10-CM

## 2021-03-03 NOTE — Progress Notes (Signed)
Patient still having pain and we will pursue an MRI for the treatment of epidurals for his radicular pain.  Rosemarie Ax, MD [facility] 03/03/2021, 8:58 AM

## 2021-03-05 ENCOUNTER — Ambulatory Visit: Payer: BC Managed Care – PPO | Admitting: Family Medicine

## 2021-03-18 ENCOUNTER — Ambulatory Visit
Admission: RE | Admit: 2021-03-18 | Discharge: 2021-03-18 | Disposition: A | Discharge status: Home / Self Care | Payer: BC Managed Care – PPO | Source: Ambulatory Visit | Attending: Family Medicine | Admitting: Family Medicine

## 2021-03-18 ENCOUNTER — Other Ambulatory Visit: Payer: Self-pay

## 2021-03-18 DIAGNOSIS — M48061 Spinal stenosis, lumbar region without neurogenic claudication: Secondary | ICD-10-CM | POA: Diagnosis not present

## 2021-03-18 DIAGNOSIS — M5416 Radiculopathy, lumbar region: Secondary | ICD-10-CM

## 2021-03-18 DIAGNOSIS — M545 Low back pain, unspecified: Secondary | ICD-10-CM | POA: Diagnosis not present

## 2021-03-22 ENCOUNTER — Other Ambulatory Visit: Payer: BC Managed Care – PPO

## 2021-03-23 ENCOUNTER — Other Ambulatory Visit: Payer: Self-pay

## 2021-03-23 ENCOUNTER — Telehealth (INDEPENDENT_AMBULATORY_CARE_PROVIDER_SITE_OTHER): Payer: BC Managed Care – PPO | Admitting: Family Medicine

## 2021-03-23 DIAGNOSIS — M48062 Spinal stenosis, lumbar region with neurogenic claudication: Secondary | ICD-10-CM | POA: Diagnosis not present

## 2021-03-23 NOTE — Assessment & Plan Note (Signed)
Symptoms seem more consistent with spinal stenosis given bilateral Presentation. -Counseled on supportive care. -Epidural. -Could consider facet injections.

## 2021-03-23 NOTE — Progress Notes (Signed)
Virtual Visit via Video Note  I connected with Dan Sanchez on 03/23/21 at  1:10 PM EDT by a video enabled telemedicine application and verified that I am speaking with the correct person using two identifiers.  Location: Patient: work Provider: office   I discussed the limitations of evaluation and management by telemedicine and the availability of in person appointments. The patient expressed understanding and agreed to proceed.  History of Present Illness:  Dan Sanchez is a 65 year old male that is following up after the MRI of the lumbar spine.  This was demonstrating spinal stenosis that has been unchanged from his previous study done in 2017.  Still having bilateral lower extremity symptoms.   Observations/Objective:  Gen: NAD, alert, cooperative with exam, well-appearing  Assessment and Plan:  Spinal stenosis of lumbar region: Symptoms seem more consistent with spinal stenosis given bilateral Presentation. -Counseled on supportive care. -Epidural. -Could consider facet injections.  Follow Up Instructions:    I discussed the assessment and treatment plan with the patient. The patient was provided an opportunity to ask questions and all were answered. The patient agreed with the plan and demonstrated an understanding of the instructions.   The patient was advised to call back or seek an in-person evaluation if the symptoms worsen or if the condition fails to improve as anticipated.   Clearance Coots, MD

## 2021-03-29 ENCOUNTER — Other Ambulatory Visit: Payer: Self-pay

## 2021-03-29 ENCOUNTER — Ambulatory Visit
Admission: RE | Admit: 2021-03-29 | Discharge: 2021-03-29 | Disposition: A | Discharge status: Home / Self Care | Payer: BC Managed Care – PPO | Source: Ambulatory Visit | Attending: Family Medicine | Admitting: Family Medicine

## 2021-03-29 DIAGNOSIS — M48061 Spinal stenosis, lumbar region without neurogenic claudication: Secondary | ICD-10-CM | POA: Diagnosis not present

## 2021-03-29 DIAGNOSIS — M48062 Spinal stenosis, lumbar region with neurogenic claudication: Secondary | ICD-10-CM

## 2021-03-29 DIAGNOSIS — M5416 Radiculopathy, lumbar region: Secondary | ICD-10-CM | POA: Diagnosis not present

## 2021-03-29 MED ORDER — IOPAMIDOL (ISOVUE-M 200) INJECTION 41%
1.0000 mL | Freq: Once | INTRAMUSCULAR | Status: AC
  Administered 2021-03-29: 1 mL via EPIDURAL

## 2021-03-29 MED ORDER — METHYLPREDNISOLONE ACETATE 40 MG/ML INJ SUSP (RADIOLOG
80.0000 mg | Freq: Once | INTRAMUSCULAR | Status: AC
  Administered 2021-03-29: 80 mg via EPIDURAL

## 2021-03-29 NOTE — Discharge Instructions (Signed)

## 2021-04-09 ENCOUNTER — Other Ambulatory Visit: Payer: Self-pay | Admitting: Family Medicine

## 2021-04-09 ENCOUNTER — Other Ambulatory Visit (HOSPITAL_BASED_OUTPATIENT_CLINIC_OR_DEPARTMENT_OTHER): Payer: Self-pay

## 2021-04-09 MED ORDER — OMEPRAZOLE 20 MG PO CPDR
DELAYED_RELEASE_CAPSULE | Freq: Every day | ORAL | 1 refills | Status: DC
  Filled 2021-04-09: qty 90, 90d supply, fill #0
  Filled 2021-07-08: qty 90, 90d supply, fill #1

## 2021-04-10 ENCOUNTER — Encounter: Payer: Self-pay | Admitting: Family Medicine

## 2021-04-12 ENCOUNTER — Other Ambulatory Visit: Payer: Self-pay | Admitting: Family Medicine

## 2021-04-12 DIAGNOSIS — M47816 Spondylosis without myelopathy or radiculopathy, lumbar region: Secondary | ICD-10-CM

## 2021-04-12 NOTE — Progress Notes (Signed)
Having ongoing pain after the epidural.  Has degenerative changes of facet joints and we will pursue facet injections.  Rosemarie Ax, MD [facility] 04/12/2021, 3:21 PM

## 2021-04-13 ENCOUNTER — Telehealth: Payer: Self-pay | Admitting: Family Medicine

## 2021-04-13 NOTE — Telephone Encounter (Signed)
The orders have been fixed. Juliann Pulse informed. Patient has appt at [facility] on 04/21/21.

## 2021-04-13 NOTE — Addendum Note (Signed)
Addended by: Cresenciano Lick on: 04/13/2021 03:32 PM   Modules accepted: Orders

## 2021-04-13 NOTE — Telephone Encounter (Signed)
Patient needs an injection but the order has not been released. Please advise, thanks.

## 2021-04-21 ENCOUNTER — Ambulatory Visit
Admission: RE | Admit: 2021-04-21 | Discharge: 2021-04-21 | Disposition: A | Discharge status: Home / Self Care | Payer: BC Managed Care – PPO | Source: Ambulatory Visit | Attending: Family Medicine | Admitting: Family Medicine

## 2021-04-21 ENCOUNTER — Other Ambulatory Visit: Payer: Self-pay | Admitting: Family Medicine

## 2021-04-21 ENCOUNTER — Other Ambulatory Visit: Payer: Self-pay

## 2021-04-21 DIAGNOSIS — M47817 Spondylosis without myelopathy or radiculopathy, lumbosacral region: Secondary | ICD-10-CM | POA: Diagnosis not present

## 2021-04-21 DIAGNOSIS — M47816 Spondylosis without myelopathy or radiculopathy, lumbar region: Secondary | ICD-10-CM

## 2021-04-21 MED ORDER — IOPAMIDOL (ISOVUE-M 200) INJECTION 41%
1.0000 mL | Freq: Once | INTRAMUSCULAR | Status: AC
  Administered 2021-04-21: 1 mL via INTRA_ARTICULAR

## 2021-04-21 MED ORDER — METHYLPREDNISOLONE ACETATE 40 MG/ML INJ SUSP (RADIOLOG
80.0000 mg | Freq: Once | INTRAMUSCULAR | Status: AC
  Administered 2021-04-21: 80 mg via INTRA_ARTICULAR

## 2021-04-21 NOTE — Discharge Instructions (Signed)
Post Procedure Spinal Discharge Instruction Sheet  1. You may resume a regular diet and any medications that you routinely take (including pain medications).  2. No driving day of procedure.  3. Light activity throughout the rest of the day.  Do not do any strenuous work, exercise, bending or lifting.  The day following the procedure, you can resume normal physical activity but you should refrain from exercising or physical therapy for at least three days thereafter.   Common Side Effects:   Headaches- take your usual medications as directed by your physician.  Increase your fluid intake.  Caffeinated beverages may be helpful.  Lie flat in bed until your headache resolves.   Restlessness or inability to sleep- you may have trouble sleeping for the next few days.  Ask your referring physician if you need any medication for sleep.   Facial flushing or redness- should subside within a few days.   Increased pain- a temporary increase in pain a day or two following your procedure is not unusual.  Take your pain medication as prescribed by your referring physician.   Leg cramps  Please contact our office at [phone] for the following symptoms:  Fever greater than 100 degrees.  Headaches unresolved with medication after 2-3 days.  Increased swelling, pain, or redness at injection site.   Thank you for visiting [facility] today.  

## 2021-05-26 ENCOUNTER — Encounter: Payer: Self-pay | Admitting: *Deleted

## 2021-06-01 ENCOUNTER — Other Ambulatory Visit (HOSPITAL_BASED_OUTPATIENT_CLINIC_OR_DEPARTMENT_OTHER): Payer: Self-pay

## 2021-06-01 MED FILL — Amlodipine Besylate Tab 10 MG (Base Equivalent): ORAL | 90 days supply | Qty: 90 | Fill #1 | Status: AC

## 2021-06-01 MED FILL — Rosuvastatin Calcium Tab 20 MG: ORAL | 90 days supply | Qty: 90 | Fill #1 | Status: AC

## 2021-07-08 ENCOUNTER — Other Ambulatory Visit (HOSPITAL_BASED_OUTPATIENT_CLINIC_OR_DEPARTMENT_OTHER): Payer: Self-pay

## 2021-07-09 ENCOUNTER — Other Ambulatory Visit (HOSPITAL_BASED_OUTPATIENT_CLINIC_OR_DEPARTMENT_OTHER): Payer: Self-pay

## 2021-07-12 ENCOUNTER — Other Ambulatory Visit (HOSPITAL_BASED_OUTPATIENT_CLINIC_OR_DEPARTMENT_OTHER): Payer: Self-pay

## 2021-07-12 ENCOUNTER — Encounter: Payer: Self-pay | Admitting: Family Medicine

## 2021-07-13 ENCOUNTER — Telehealth (INDEPENDENT_AMBULATORY_CARE_PROVIDER_SITE_OTHER): Payer: BC Managed Care – PPO | Admitting: Family Medicine

## 2021-07-13 ENCOUNTER — Encounter: Payer: Self-pay | Admitting: Family Medicine

## 2021-07-13 VITALS — Temp 97.8°F

## 2021-07-13 DIAGNOSIS — U071 COVID-19: Secondary | ICD-10-CM

## 2021-07-13 NOTE — Patient Instructions (Addendum)
   ---------------------------------------------------------------------------------------------------------------------------      WORK SLIP:  Patient Dan Sanchez,  Jul 25, 1956, was seen for a medical visit today, 07/13/21 . Please excuse from work for a COVID like illness. We advise 10 days minimum from the onset of symptoms (07/21/21) PLUS 1 day of no fever and improved symptoms. Will defer to employer for a sooner return to work if symptoms have resolved, it is greater than 5 days since the positive test and the patient can wear a high-quality, tight fitting mask such as N95 or KN95 at all times for an additional 5 days. Would also suggest COVID19 antigen testing is negative prior to return.  Sincerely: E-signature: Dr. Colin Benton, DO [place] Ph: [phone]   ------------------------------------------------------------------------------------------------------------------------------    HOME CARE TIPS:   -can use tylenol if needed for fevers, aches and pains per instructions  -can use nasal saline a few times per day if you have nasal congestion  -stay hydrated, drink plenty of fluids and eat small healthy meals - avoid dairy  -can take 1000 IU (79mg) Vit D3 and 100-500 mg of Vit C daily per instructions  -If the Covid test is positive, check out the [facility] for more information on home care, transmission and treatment for COVID19  -follow up with your doctor in 2-3 days unless improving and feeling better  -stay home while sick, except to seek medical care. If you have COVID19, ideally it would be best to stay home for a full 10 days since the onset of symptoms PLUS one day of no fever and feeling better. Wear a good mask that fits snugly (such as N95 or KN95) if around others to reduce the risk of transmission.  It was nice to meet you today, and I really hope you are feeling better soon. I help [facility] out with telemedicine visits on  Tuesdays and Thursdays and am available for visits on those days. If you have any concerns or questions following this visit please schedule a follow up visit with your Primary Care doctor or seek care at a local urgent care clinic to avoid delays in care.    Seek in person care or schedule a follow up video visit promptly if your symptoms worsen, new concerns arise or you are not improving with treatment. Call 911 and/or seek emergency care if your symptoms are severe or life threatening.

## 2021-07-13 NOTE — Progress Notes (Signed)
Virtual Visit via Video Note  I connected with Dan Sanchez  on 07/13/21 at  4:20 PM EDT by a video enabled telemedicine application and verified that I am speaking with the correct person using two identifiers.  Location patient: home, [place] Location provider:work or home office Persons participating in the virtual visit: patient, provider  I discussed the limitations of evaluation and management by telemedicine and the availability of in person appointments. The patient expressed understanding and agreed to proceed.   HPI:  Acute telemedicine visit for : -Onset: 2 days ago and had a positive home covid test -Symptoms include:fevers, nasal congestion, cough, sore throat -temp is down today - T97.4 and reports he is feeling better today but has a sore throat -Denies:CP, SOB, NVD, inability to eat/drink/get out of bed -Has tried:dayquil, nyquil -Pertinent past medical history: HTN, CAD, obesity and see below -Pertinent medication allergies:  Allergies  Allergen Reactions   Bee Venom Swelling  -COVID-19 vaccine status: 2 doses and 1 booster -had labs in November with GFR 69, reports no issue w/ kidneys or liver in the past  ROS: See pertinent positives and negatives per HPI.  Past Medical History:  Diagnosis Date   Allergy    CAD (coronary artery disease)    mild non-obstructive   GERD (gastroesophageal reflux disease)    Hyperlipidemia    S/P right rotator cuff repair    Sinusitis, bacterial    Urinary incontinence     Past Surgical History:  Procedure Laterality Date   COLONOSCOPY  04/08/2009   LEFT HEART CATHETERIZATION WITH CORONARY ANGIOGRAM N/A 08/13/2014   Procedure: LEFT HEART CATHETERIZATION WITH CORONARY ANGIOGRAM;  Surgeon: Burnell Blanks, MD;  Location[facility] CATH LAB;  Service: Cardiovascular;  Laterality: N/A;   SHOULDER ARTHROSCOPY WITH ROTATOR CUFF REPAIR Right 10/26/2017   Procedure: RIGHT SHOULDER ARTHROSCOPY WITH ROTATOR CUFF REPAIR AND DEBRIDEMENT;  Surgeon:  Sydnee Cabal, MD;  Location: [place];  Service: Orthopedics;  Laterality: Right;   SHOULDER SURGERY     Right shoulder      Current Outpatient Medications:    amLODipine (NORVASC) 10 MG tablet, TAKE 1 TABLET BY MOUTH ONCE DAILY, Disp: 90 tablet, Rfl: 3   Ascorbic Acid (VITAMIN C) 1000 MG tablet, Take 1,000 mg by mouth daily., Disp: , Rfl:    aspirin EC 81 MG tablet, Take 1 tablet (81 mg total) by mouth daily. Swallow whole., Disp: 90 tablet, Rfl: 3   fexofenadine (ALLEGRA) 180 MG tablet, [facility], Disp: , Rfl:    fluticasone (FLONASE) 50 MCG/ACT nasal spray, Place 2 sprays into both nostrils daily., Disp: 16 g, Rfl: 6   Omega-3 Fatty Acids (FISH OIL) 1200 MG CAPS, Take 1,200 mg by mouth 2 (two) times daily., Disp: , Rfl:    omeprazole (PRILOSEC) 20 MG capsule, TAKE ONE CAPSULE BY MOUTH DAILY, Disp: 90 capsule, Rfl: 1   rosuvastatin (CRESTOR) 20 MG tablet, TAKE 1 TABLET BY MOUTH ONCE DAILY, Disp: 90 tablet, Rfl: 3  EXAM:  VITALS per patient if applicable:  GENERAL: alert, oriented, appears well and in no acute distress  HEENT: atraumatic, conjunttiva clear, no obvious abnormalities on inspection of external nose and ears  NECK: normal movements of the head and neck  LUNGS: on inspection no signs of respiratory distress, breathing rate appears normal, no obvious gross SOB, gasping or wheezing  CV: no obvious cyanosis  MS: moves all visible extremities without noticeable abnormality  PSYCH/NEURO: pleasant and cooperative, no obvious depression or anxiety, speech and  thought processing grossly intact  ASSESSMENT AND PLAN:  Discussed the following assessment and plan:  No diagnosis found.   Discussed treatment options (infusions and oral options and risk of drug interactions), ideal treatment window, potential complications, isolation and precautions for COVID-19.   After lengthy discussion, the patient declined referral for Covid outpatient  treatment/antiviral at this time as he feels he is improving. Advised of treatment window and advised video visit follow up with PCP or [facility] if changes his mind.  He also declined an rx for cough as prefers to use otc options. He wondered if an abx would be useful - did explain that current recommendations do not advise abx for covid. Other symptomatic care measures summarized in patient instructions. Work/School slipped offered: provided in patient instructions  Advised to seek prompt in person care if worsening, new symptoms arise, or if is not improving with treatment. Discussed options for inperson care if PCP office not available. Did let this patient know that I only do telemedicine on Tuesdays and Thursdays for [facility]. Advised to schedule follow up visit with PCP or [facility] if any further questions or concerns to avoid delays in care.   I discussed the assessment and treatment plan with the patient. The patient was provided an opportunity to ask questions and all were answered. The patient agreed with the plan and demonstrated an understanding of the instructions.     Lucretia Kern, DO

## 2021-08-09 DIAGNOSIS — M25561 Pain in right knee: Secondary | ICD-10-CM | POA: Diagnosis not present

## 2021-09-06 ENCOUNTER — Other Ambulatory Visit (HOSPITAL_BASED_OUTPATIENT_CLINIC_OR_DEPARTMENT_OTHER): Payer: Self-pay

## 2021-09-06 ENCOUNTER — Other Ambulatory Visit: Payer: Self-pay | Admitting: Cardiovascular Disease

## 2021-09-06 MED ORDER — ROSUVASTATIN CALCIUM 20 MG PO TABS
ORAL_TABLET | Freq: Every day | ORAL | 3 refills | Status: DC
  Filled 2021-09-06: qty 90, 90d supply, fill #0

## 2021-09-06 MED FILL — Amlodipine Besylate Tab 10 MG (Base Equivalent): ORAL | 90 days supply | Qty: 90 | Fill #2 | Status: AC

## 2021-09-15 ENCOUNTER — Other Ambulatory Visit (HOSPITAL_BASED_OUTPATIENT_CLINIC_OR_DEPARTMENT_OTHER): Payer: Self-pay

## 2021-09-15 MED ORDER — INFLUENZA VAC SPLIT QUAD 0.5 ML IM SUSY
PREFILLED_SYRINGE | INTRAMUSCULAR | 0 refills | Status: DC
  Filled 2021-09-15 (×2): qty 0.5, 1d supply, fill #0

## 2021-09-27 ENCOUNTER — Encounter: Payer: Self-pay | Admitting: Family Medicine

## 2021-09-29 ENCOUNTER — Other Ambulatory Visit (HOSPITAL_BASED_OUTPATIENT_CLINIC_OR_DEPARTMENT_OTHER): Payer: Self-pay

## 2021-09-29 MED ORDER — SILDENAFIL CITRATE 100 MG PO TABS
50.0000 mg | ORAL_TABLET | Freq: Every day | ORAL | 11 refills | Status: DC | PRN
  Filled 2021-09-29: qty 10, 10d supply, fill #0

## 2021-10-12 ENCOUNTER — Encounter: Payer: Self-pay | Admitting: Family Medicine

## 2021-10-12 ENCOUNTER — Ambulatory Visit (INDEPENDENT_AMBULATORY_CARE_PROVIDER_SITE_OTHER): Payer: BC Managed Care – PPO | Admitting: Family Medicine

## 2021-10-12 VITALS — BP 134/86 | HR 66 | Temp 98.4°F | Resp 16 | Ht 70.5 in | Wt 199.2 lb

## 2021-10-12 DIAGNOSIS — I10 Essential (primary) hypertension: Secondary | ICD-10-CM

## 2021-10-12 DIAGNOSIS — Z114 Encounter for screening for human immunodeficiency virus [HIV]: Secondary | ICD-10-CM

## 2021-10-12 DIAGNOSIS — Z1159 Encounter for screening for other viral diseases: Secondary | ICD-10-CM

## 2021-10-12 DIAGNOSIS — Z125 Encounter for screening for malignant neoplasm of prostate: Secondary | ICD-10-CM | POA: Diagnosis not present

## 2021-10-12 DIAGNOSIS — Z0000 Encounter for general adult medical examination without abnormal findings: Secondary | ICD-10-CM | POA: Diagnosis not present

## 2021-10-12 LAB — LIPID PANEL
Cholesterol: 202 mg/dL — ABNORMAL HIGH (ref 0–200)
HDL: 67.8 mg/dL (ref >39.00)
LDL Cholesterol: 107 mg/dL — ABNORMAL HIGH (ref 0–99)
NonHDL: 134.47
Total CHOL/HDL Ratio: 3
Triglycerides: 135 mg/dL (ref 0.0–149.0)
VLDL: 27 mg/dL (ref 0.0–40.0)

## 2021-10-12 LAB — BASIC METABOLIC PANEL
BUN: 14 mg/dL (ref 6–23)
CO2: 28 mEq/L (ref 19–32)
Calcium: 9.3 mg/dL (ref 8.4–10.5)
Chloride: 104 mEq/L (ref 96–112)
Creatinine, Ser: 1.03 mg/dL (ref 0.40–1.50)
GFR: 77.46 mL/min (ref >60.00)
Glucose, Bld: 101 mg/dL — ABNORMAL HIGH (ref 70–99)
Potassium: 4 mEq/L (ref 3.5–5.1)
Sodium: 142 mEq/L (ref 135–145)

## 2021-10-12 LAB — CBC WITH DIFFERENTIAL/PLATELET
Basophils Absolute: 0 10*3/uL (ref 0.0–0.1)
Basophils Relative: 0.9 % (ref 0.0–3.0)
Eosinophils Absolute: 0.1 10*3/uL (ref 0.0–0.7)
Eosinophils Relative: 2.9 % (ref 0.0–5.0)
HCT: 42.5 % (ref 39.0–52.0)
Hemoglobin: 14.5 g/dL (ref 13.0–17.0)
Lymphocytes Relative: 28.9 % (ref 12.0–46.0)
Lymphs Abs: 1.2 10*3/uL (ref 0.7–4.0)
MCHC: 34 g/dL (ref 30.0–36.0)
MCV: 87.1 fl (ref 78.0–100.0)
Monocytes Absolute: 0.4 10*3/uL (ref 0.1–1.0)
Monocytes Relative: 9.4 % (ref 3.0–12.0)
Neutro Abs: 2.4 10*3/uL (ref 1.4–7.7)
Neutrophils Relative %: 57.9 % (ref 43.0–77.0)
Platelets: 232 10*3/uL (ref 150.0–400.0)
RBC: 4.88 Mil/uL (ref 4.22–5.81)
RDW: 13.5 % (ref 11.5–15.5)
WBC: 4.1 10*3/uL (ref 4.0–10.5)

## 2021-10-12 LAB — HEPATIC FUNCTION PANEL
ALT: 53 U/L (ref 0–53)
AST: 31 U/L (ref 0–37)
Albumin: 4.5 g/dL (ref 3.5–5.2)
Alkaline Phosphatase: 71 U/L (ref 39–117)
Bilirubin, Direct: 0.1 mg/dL (ref 0.0–0.3)
Total Bilirubin: 0.5 mg/dL (ref 0.2–1.2)
Total Protein: 6.8 g/dL (ref 6.0–8.3)

## 2021-10-12 LAB — TSH: TSH: 1.98 u[IU]/mL (ref 0.35–5.50)

## 2021-10-12 LAB — PSA: PSA: 1.65 ng/mL (ref 0.10–4.00)

## 2021-10-12 NOTE — Patient Instructions (Signed)
Follow up in 6 months to recheck BP and cholesterol We'll notify you of your lab results and make any changes Continue to work on healthy diet and regular exercise- you're doing great! Call with any questions or concerns Stay Safe!  Stay Healthy! Happy Holidays!!!

## 2021-10-12 NOTE — Progress Notes (Signed)
   Subjective:    Patient ID: Dan Sanchez, male    DOB: January 08, 1956, 65 y.o.   MRN: [id number]  HPI CPE- UTD on Tdap, flu,colonoscopy.  Patient Care Team    Relationship Specialty Notifications Start End  Midge Minium, MD PCP - General   11/10/10    Comment: Rosalva Ferron, MD PCP - Cardiology Cardiology Admissions 07/16/18   Sharmon Revere Physician Assistant Physician Assistant  04/06/12   Burnell Blanks, MD Consulting Physician Cardiology  07/15/15     Health Maintenance  Topic Date Due   Pneumococcal Vaccine 75-73 Years old (1 - PCV) Never done   HIV Screening  Never done   Hepatitis C Screening  Never done   Zoster Vaccines- Shingrix (1 of 2) Never done   COVID-19 Vaccine (4 - Booster for Pfizer series) 01/06/2021   TETANUS/TDAP  09/24/2028   COLONOSCOPY (Pts 45-6yrs Insurance coverage will need to be confirmed)  05/26/2029   INFLUENZA VACCINE  Completed   HPV VACCINES  Aged Out      Review of Systems Patient reports no vision/hearing changes, anorexia, fever ,adenopathy, persistant/recurrent hoarseness, swallowing issues, chest pain, palpitations, persistant/recurrent cough, hemoptysis, dyspnea (rest,exertional, paroxysmal nocturnal), gastrointestinal  bleeding (melena, rectal bleeding), abdominal pain, excessive heart burn, GU symptoms (dysuria, hematuria, voiding/incontinence issues) syncope, focal weakness, memory loss, numbness & tingling, skin/hair/nail changes, depression, anxiety, abnormal bruising/bleeding, musculoskeletal symptoms/signs.   + edema- occurred while traveling in [place], improved upon coming home  This visit occurred during the SARS-CoV-2 public health emergency.  Safety protocols were in place, including screening questions prior to the visit, additional usage of staff PPE, and extensive cleaning of exam room while observing appropriate contact time as indicated for disinfecting solutions.      Objective:    Physical Exam General Appearance:    Alert, cooperative, no distress, appears stated age  Head:    Normocephalic, without obvious abnormality, atraumatic  Eyes:    PERRL, EOM's intact, L conjunctiva injected  Ears:    Normal TM's and external ear canals, both ears  Nose:   Deferred due to COVID  Throat:   Neck:   Supple, symmetrical, trachea midline, no adenopathy;       thyroid:  No enlargement/tenderness/nodules  Back:     Symmetric, no curvature, ROM normal, no CVA tenderness  Lungs:     Clear to auscultation bilaterally, respirations unlabored  Chest wall:    No tenderness or deformity  Heart:    Regular rate and rhythm, S1 and S2 normal, no murmur, rub   or gallop  Abdomen:     Soft, non-tender, bowel sounds active all four quadrants,    no masses, no organomegaly  Genitalia:    deferred  Rectal:    Extremities:   Extremities normal, atraumatic, no cyanosis or edema  Pulses:   2+ and symmetric all extremities  Skin:   Skin color, texture, turgor normal, no rashes or lesions  Lymph nodes:   Cervical, supraclavicular, and axillary nodes normal  Neurologic:   CNII-XII intact. Normal strength, sensation and reflexes      throughout          Assessment & Plan:

## 2021-10-12 NOTE — Assessment & Plan Note (Signed)
[name] PE WNL w/ exception of L eye injection.  Pt denies pain or visual changes.  Encouraged him to schedule a formal eye exam.  UTD on colonoscopy and immunizations.  Check labs.  Anticipatory guidance provided.

## 2021-10-12 NOTE — Assessment & Plan Note (Signed)
Chronic problem.  Adequate control today.  Check labs.  No anticipated med changes. ?

## 2021-10-13 LAB — HEPATITIS C ANTIBODY
Hepatitis C Ab: NONREACTIVE
SIGNAL TO CUT-OFF: 0.04 (ref <1.00)

## 2021-10-13 LAB — HIV ANTIBODY (ROUTINE TESTING W REFLEX): HIV 1&2 Ab, 4th Generation: NONREACTIVE

## 2021-10-14 ENCOUNTER — Other Ambulatory Visit: Payer: Self-pay | Admitting: Family Medicine

## 2021-10-14 ENCOUNTER — Other Ambulatory Visit (HOSPITAL_BASED_OUTPATIENT_CLINIC_OR_DEPARTMENT_OTHER): Payer: Self-pay

## 2021-10-14 DIAGNOSIS — L814 Other melanin hyperpigmentation: Secondary | ICD-10-CM | POA: Diagnosis not present

## 2021-10-14 DIAGNOSIS — L821 Other seborrheic keratosis: Secondary | ICD-10-CM | POA: Diagnosis not present

## 2021-10-14 DIAGNOSIS — L738 Other specified follicular disorders: Secondary | ICD-10-CM | POA: Diagnosis not present

## 2021-10-14 DIAGNOSIS — L820 Inflamed seborrheic keratosis: Secondary | ICD-10-CM | POA: Diagnosis not present

## 2021-10-14 DIAGNOSIS — L570 Actinic keratosis: Secondary | ICD-10-CM | POA: Diagnosis not present

## 2021-10-14 MED ORDER — FLUOROURACIL 5 % EX CREA
TOPICAL_CREAM | CUTANEOUS | 0 refills | Status: DC
  Filled 2021-10-14 (×2): qty 40, 14d supply, fill #0

## 2021-10-14 MED ORDER — OMEPRAZOLE 20 MG PO CPDR
20.0000 mg | DELAYED_RELEASE_CAPSULE | Freq: Every day | ORAL | 1 refills | Status: DC
  Filled 2021-10-14: qty 90, 90d supply, fill #0
  Filled 2022-01-19: qty 90, 90d supply, fill #1

## 2021-10-15 ENCOUNTER — Other Ambulatory Visit (HOSPITAL_BASED_OUTPATIENT_CLINIC_OR_DEPARTMENT_OTHER): Payer: Self-pay

## 2021-10-15 ENCOUNTER — Telehealth: Payer: Self-pay

## 2021-10-15 MED ORDER — FLUOROURACIL 5 % EX CREA
TOPICAL_CREAM | CUTANEOUS | 0 refills | Status: DC
  Filled 2021-10-15: qty 40, 30d supply, fill #0

## 2021-10-15 NOTE — Telephone Encounter (Signed)
-----   Message from [place], [place] sent at 10/15/2021 12:01 PM EST -----  ----- Message ----- From: Midge Minium, MD Sent: 10/12/2021  12:29 PM EST To: Kelton Pillar, CMA  Labs look great!  No changes at this time

## 2021-10-15 NOTE — Telephone Encounter (Signed)
Spoke to patient and made him aware of his lab results

## 2021-11-17 ENCOUNTER — Encounter: Payer: Self-pay | Admitting: Family Medicine

## 2021-11-18 ENCOUNTER — Other Ambulatory Visit: Payer: Self-pay | Admitting: Family Medicine

## 2021-11-18 DIAGNOSIS — M5416 Radiculopathy, lumbar region: Secondary | ICD-10-CM

## 2021-11-24 ENCOUNTER — Other Ambulatory Visit: Payer: Self-pay

## 2021-11-24 ENCOUNTER — Ambulatory Visit
Admission: RE | Admit: 2021-11-24 | Discharge: 2021-11-24 | Disposition: A | Discharge status: Home / Self Care | Payer: BC Managed Care – PPO | Source: Ambulatory Visit | Attending: Family Medicine | Admitting: Family Medicine

## 2021-11-24 DIAGNOSIS — M47817 Spondylosis without myelopathy or radiculopathy, lumbosacral region: Secondary | ICD-10-CM | POA: Diagnosis not present

## 2021-11-24 DIAGNOSIS — M5416 Radiculopathy, lumbar region: Secondary | ICD-10-CM

## 2021-11-24 MED ORDER — METHYLPREDNISOLONE ACETATE 40 MG/ML INJ SUSP (RADIOLOG
80.0000 mg | Freq: Once | INTRAMUSCULAR | Status: AC
  Administered 2021-11-24: 08:00:00 80 mg via EPIDURAL

## 2021-11-24 MED ORDER — IOPAMIDOL (ISOVUE-M 200) INJECTION 41%
1.0000 mL | Freq: Once | INTRAMUSCULAR | Status: AC
  Administered 2021-11-24: 08:00:00 1 mL via EPIDURAL

## 2021-11-24 NOTE — Discharge Instructions (Signed)

## 2021-12-06 NOTE — Progress Notes (Signed)
Chief Complaint  Patient presents with   Follow-up    CAD    History of Present Illness: 66 yo male with history of CAD and hyperlipidemia here today for cardiac follow up. He had an abnormal stress test in 2015 with ST elevation in AVR, AVL but no chest pain with exercise. Cardiac cath 08/13/14 with mild disease in the LAD (20%) and RCA (20%). LV systolic function was normal.   He is here today for follow up. The patient denies any chest pain, dyspnea, palpitations, lower extremity edema, orthopnea, PND, dizziness, near syncope or syncope.   Primary Care Physician: Midge Minium, MD  Past Medical History:  Diagnosis Date   Allergy    CAD (coronary artery disease)    mild non-obstructive   GERD (gastroesophageal reflux disease)    Hyperlipidemia    S/P right rotator cuff repair    Sinusitis, bacterial    Urinary incontinence     Past Surgical History:  Procedure Laterality Date   COLONOSCOPY  04/08/2009   LEFT HEART CATHETERIZATION WITH CORONARY ANGIOGRAM N/A 08/13/2014   Procedure: LEFT HEART CATHETERIZATION WITH CORONARY ANGIOGRAM;  Surgeon: Burnell Blanks, MD;  Location: [facility] CATH LAB;  Service: Cardiovascular;  Laterality: N/A;   SHOULDER ARTHROSCOPY WITH ROTATOR CUFF REPAIR Right 10/26/2017   Procedure: RIGHT SHOULDER ARTHROSCOPY WITH ROTATOR CUFF REPAIR AND DEBRIDEMENT;  Surgeon: Sydnee Cabal, MD;  Location: [place];  Service: Orthopedics;  Laterality: Right;   SHOULDER SURGERY     Right shoulder     Current Outpatient Medications  Medication Sig Dispense Refill   Ascorbic Acid (VITAMIN C) 1000 MG tablet Take 1,000 mg by mouth daily.     aspirin EC 81 MG tablet Take 1 tablet (81 mg total) by mouth daily. Swallow whole. 90 tablet 3   fexofenadine (ALLEGRA) 180 MG tablet Allegra Allergy     fluorouracil (EFUDEX) 5 % cream Apply a small amount to affected area every night for 2 weeks 40 g 0   fluorouracil (EFUDEX) 5 % cream Apply 1 a  small amount to affected area every night Apply nightly x 2 weeks 40 g 0   fluticasone (FLONASE) 50 MCG/ACT nasal spray Place 2 sprays into both nostrils daily. 16 g 6   Omega-3 Fatty Acids (FISH OIL) 1200 MG CAPS Take 1,200 mg by mouth 2 (two) times daily.     omeprazole (PRILOSEC) 20 MG capsule Take 1 capsule (20 mg total) by mouth daily. 90 capsule 1   rosuvastatin (CRESTOR) 40 MG tablet Take 1 tablet (40 mg total) by mouth daily. 90 tablet 3   sildenafil (VIAGRA) 100 MG tablet Take 1/2-1 tablet (50-100 mg total) by mouth daily as needed for erectile dysfunction. 10 tablet 11   amLODipine (NORVASC) 10 MG tablet Take 1 tablet (10 mg total) by mouth daily. 90 tablet 3   No current facility-administered medications for this visit.    Allergies  Allergen Reactions   Bee Venom Swelling    Social History   Socioeconomic History   Marital status: Married    Spouse name: Not on file   Number of children: Not on file   Years of education: Not on file   Highest education level: Not on file  Occupational History   Occupation: Nurse, adult: [name]  Tobacco Use   Smoking status: Never   Smokeless tobacco: Never  Vaping Use   Vaping Use: Never used  Substance and Sexual Activity  Alcohol use: Yes    Alcohol/week: 4.0 standard drinks    Types: 4 Standard drinks or equivalent per week    Comment: occ   Drug use: No   Sexual activity: Not on file  Other Topics Concern   Not on file  Social History Narrative   Not on file   Social Determinants of Health   Financial Resource Strain: Not on file  Food Insecurity: Not on file  Transportation Needs: Not on file  Physical Activity: Not on file  Stress: Not on file  Social Connections: Not on file  Intimate Partner Violence: Not on file    Family History  Problem Relation Age of Onset   Heart disease Father        MI age 38   Colon cancer Neg Hx    Esophageal cancer Neg Hx    Rectal cancer Neg Hx     Stomach cancer Neg Hx    Colon polyps Neg Hx     Review of Systems:  As stated in the HPI and otherwise negative.   BP 134/70    Pulse 65    Ht 5' 10.5" (1.791 m)    Wt 198 lb 9.6 oz (90.1 kg)    SpO2 98%    BMI 28.09 kg/m   Physical Examination:  General: Well developed, well nourished, NAD  HEENT: OP clear, mucus membranes moist  SKIN: warm, dry. No rashes. Neuro: No focal deficits  Musculoskeletal: Muscle strength 5/5 all ext  Psychiatric: Mood and affect normal  Neck: No JVD, no carotid bruits, no thyromegaly, no lymphadenopathy.  Lungs:Clear bilaterally, no wheezes, rhonci, crackles Cardiovascular: Regular rate and rhythm. No murmurs, gallops or rubs. Abdomen:Soft. Bowel sounds present. Non-tender.  Extremities: No lower extremity edema. Pulses are 2 + in the bilateral DP/PT.  Cardiac cath 08/13/14: Hemodynamic Findings:  Central aortic pressure: 137/82  Left ventricular pressure: 144/8/14  Angiographic Findings:  Left main: No obstructive disease.  Left Anterior Descending Artery: Large caliber vessel that courses to the apex. There is a 20% stenosis in the mid vessel. There are mild luminal irregularities in the distal vessel. There are several small caliber diagonal branches.  Circumflex Artery: Large caliber vessel with termination into an obtuse marginal branch. No obstructive disease.  Right Coronary Artery: Large dominant vessel with 20% proximal stenosis.  Left Ventricular Angiogram: LVEF=65-70%.   EKG:  EKG is ordered today. The ekg ordered today demonstrates Sinus  Recent Labs: 10/12/2021: ALT 53; BUN 14; Creatinine, Ser 1.03; Hemoglobin 14.5; Platelets 232.0; Potassium 4.0; Sodium 142; TSH 1.98   Lipid Panel    Component Value Date/Time   CHOL 202 (H) 10/12/2021 0835   TRIG 135.0 10/12/2021 0835   TRIG 81 10/04/2006 1227   HDL 67.80 10/12/2021 0835   CHOLHDL 3 10/12/2021 0835   VLDL 27.0 10/12/2021 0835   LDLCALC 107 (H) 10/12/2021 0835   LDLDIRECT  120.9 03/16/2012 1054     Wt Readings from Last 3 Encounters:  12/07/21 198 lb 9.6 oz (90.1 kg)  10/12/21 199 lb 3.2 oz (90.4 kg)  02/04/21 201 lb (91.2 kg)     Other studies Reviewed: Additional studies/ records that were reviewed today include: . Review of the above records demonstrates:    Assessment and Plan:   1. CAD without angina: He is known to have mild CAD by cath in September 2015. No chest pain. Continue ASA and statin  2. HLD: Lipids followed in primary care. LDL 107  in November 2022. Goal  LDL is under 70. Will increase Crestor to 40 mg daily. He will need repeat Lipids and LFTs in 12 weeks. He wishes to do this with his other labs in primary care and will call us to do his labs here if he cannot arrange this with Dr. Birdie Riddle.   3. HTN: BP is controlled. No changes  Current medicines are reviewed at length with the patient today.  The patient does not have concerns regarding medicines.  The following changes have been made:  no change  Labs/ tests ordered today include:   Orders Placed This Encounter  Procedures   EKG 12-Lead    Disposition:   F/U with me in 12  months  Signed, Lauree Chandler, MD 12/07/2021 9:41 AM    [facility] [address], [place], [place]  [address] Phone: [phone]; Fax: [phone]

## 2021-12-07 ENCOUNTER — Other Ambulatory Visit (HOSPITAL_BASED_OUTPATIENT_CLINIC_OR_DEPARTMENT_OTHER): Payer: Self-pay

## 2021-12-07 ENCOUNTER — Other Ambulatory Visit: Payer: Self-pay

## 2021-12-07 ENCOUNTER — Encounter: Payer: Self-pay | Admitting: Cardiovascular Disease

## 2021-12-07 ENCOUNTER — Ambulatory Visit: Payer: BC Managed Care – PPO | Admitting: Cardiovascular Disease

## 2021-12-07 VITALS — BP 134/70 | HR 65 | Ht 70.5 in | Wt 198.6 lb

## 2021-12-07 DIAGNOSIS — E7800 Pure hypercholesterolemia, unspecified: Secondary | ICD-10-CM

## 2021-12-07 DIAGNOSIS — I10 Essential (primary) hypertension: Secondary | ICD-10-CM

## 2021-12-07 DIAGNOSIS — I2510 Atherosclerotic heart disease of native coronary artery without angina pectoris: Secondary | ICD-10-CM

## 2021-12-07 MED ORDER — AMLODIPINE BESYLATE 10 MG PO TABS
10.0000 mg | ORAL_TABLET | Freq: Every day | ORAL | 3 refills | Status: DC
  Filled 2021-12-07: qty 90, 90d supply, fill #0
  Filled 2022-03-21: qty 90, 90d supply, fill #1
  Filled 2022-06-02: qty 90, 90d supply, fill #2
  Filled 2022-09-07: qty 90, 90d supply, fill #3

## 2021-12-07 MED ORDER — ROSUVASTATIN CALCIUM 40 MG PO TABS
40.0000 mg | ORAL_TABLET | Freq: Every day | ORAL | 3 refills | Status: DC
  Filled 2021-12-07: qty 90, 90d supply, fill #0
  Filled 2022-03-03: qty 90, 90d supply, fill #1
  Filled 2022-05-23: qty 90, 90d supply, fill #2
  Filled 2022-09-07: qty 90, 90d supply, fill #3

## 2021-12-07 NOTE — Patient Instructions (Signed)
Medication Instructions:  Your physician has recommended you make the following change in your medication:  1.) increase rosuvastatin (Crestor) to 40 mg - one tablet daily  *If you need a refill on your cardiac medications before your next appointment, please call your pharmacy*   Lab Work: none   Testing/Procedures: none   Follow-Up: At [facility], you and your health needs are our priority.  As part of our continuing mission to provide you with exceptional heart care, we have created designated Provider Care Teams.  These Care Teams include your primary Cardiologist (physician) and Advanced Practice Providers (APPs -  Physician Assistants and Nurse Practitioners) who all work together to provide you with the care you need, when you need it.   Your next appointment:   12 month(s)  The format for your next appointment:   In Person  Provider:   Lauree Chandler, MD     Other Instructions

## 2022-01-10 ENCOUNTER — Other Ambulatory Visit: Payer: Self-pay

## 2022-01-10 ENCOUNTER — Ambulatory Visit: Payer: BC Managed Care – PPO | Admitting: Registered Nurse

## 2022-01-10 ENCOUNTER — Encounter: Payer: Self-pay | Admitting: Registered Nurse

## 2022-01-10 ENCOUNTER — Other Ambulatory Visit (HOSPITAL_BASED_OUTPATIENT_CLINIC_OR_DEPARTMENT_OTHER): Payer: Self-pay

## 2022-01-10 VITALS — BP 160/90 | HR 73 | Temp 98.3°F | Resp 18 | Ht 70.5 in | Wt 202.0 lb

## 2022-01-10 DIAGNOSIS — J019 Acute sinusitis, unspecified: Secondary | ICD-10-CM

## 2022-01-10 DIAGNOSIS — B9689 Other specified bacterial agents as the cause of diseases classified elsewhere: Secondary | ICD-10-CM | POA: Diagnosis not present

## 2022-01-10 MED ORDER — AMOXICILLIN-POT CLAVULANATE 875-125 MG PO TABS
1.0000 | ORAL_TABLET | Freq: Two times a day (BID) | ORAL | 0 refills | Status: DC
  Filled 2022-01-10: qty 20, 10d supply, fill #0

## 2022-01-10 MED ORDER — PREDNISONE 20 MG PO TABS
20.0000 mg | ORAL_TABLET | Freq: Every day | ORAL | 0 refills | Status: DC
  Filled 2022-01-10: qty 5, 5d supply, fill #0

## 2022-01-10 NOTE — Progress Notes (Signed)
Established Patient Office Visit  Subjective:  Patient ID: Dan Sanchez, male    DOB: July 06, 1956  Age: 66 y.o. MRN: [id number]  CC:  Chief Complaint  Patient presents with   Sinusitis    Patient states he thinks he has been experiencing a sinus infection. Head congestion, nasal congestion, cough, runny nose. He took some OTC cough medications.    HPI Dan Sanchez presents for sinusitis  Ongoing 7-8 days Head congestion, nasal congestion. Pnd with some cough. Rhinorrhea L side ear clogged, pressure.  No sick contacts Home covid test negative Has been using OTC decongestants and antitussives with limited relief.   Sick contact - son with sinus infection 2 weeks ago   Denies shob, doe, chest pain, headache, visual changes.   BP elevated on arrival. Continues to take amlodipine 10mg  po qd. Last visit with PCP was Nov for Cpe, bp wnl at that time. Recommend follow up as scheduled with PCP. Low salt diet. Continue home monitoring of BP  Past Medical History:  Diagnosis Date   Allergy    CAD (coronary artery disease)    mild non-obstructive   GERD (gastroesophageal reflux disease)    Hyperlipidemia    S/P right rotator cuff repair    Sinusitis, bacterial    Urinary incontinence     Past Surgical History:  Procedure Laterality Date   COLONOSCOPY  04/08/2009   LEFT HEART CATHETERIZATION WITH CORONARY ANGIOGRAM N/A 08/13/2014   Procedure: LEFT HEART CATHETERIZATION WITH CORONARY ANGIOGRAM;  Surgeon: Burnell Blanks, MD;  Location: [facility] CATH LAB;  Service: Cardiovascular;  Laterality: N/A;   SHOULDER ARTHROSCOPY WITH ROTATOR CUFF REPAIR Right 10/26/2017   Procedure: RIGHT SHOULDER ARTHROSCOPY WITH ROTATOR CUFF REPAIR AND DEBRIDEMENT;  Surgeon: Sydnee Cabal, MD;  Location: [place] [facility];  Service: Orthopedics;  Laterality: Right;   SHOULDER SURGERY     Right shoulder     Family History  Problem Relation Age of Onset   Heart disease Father        MI  age 85   Colon cancer Neg Hx    Esophageal cancer Neg Hx    Rectal cancer Neg Hx    Stomach cancer Neg Hx    Colon polyps Neg Hx     Social History   Socioeconomic History   Marital status: Married    Spouse name: Not on file   Number of children: Not on file   Years of education: Not on file   Highest education level: Not on file  Occupational History   Occupation: Nurse, adult: [name]  Tobacco Use   Smoking status: Never   Smokeless tobacco: Never  Vaping Use   Vaping Use: Never used  Substance and Sexual Activity   Alcohol use: Yes    Alcohol/week: 4.0 standard drinks    Types: 4 Standard drinks or equivalent per week    Comment: occ   Drug use: No   Sexual activity: Not on file  Other Topics Concern   Not on file  Social History Narrative   Not on file   Social Determinants of Health   Financial Resource Strain: Not on file  Food Insecurity: Not on file  Transportation Needs: Not on file  Physical Activity: Not on file  Stress: Not on file  Social Connections: Not on file  Intimate Partner Violence: Not on file    Outpatient Medications Prior to Visit  Medication Sig Dispense Refill   amLODipine ([place])  10 MG tablet Take 1 tablet (10 mg total) by mouth daily. 90 tablet 3   Ascorbic Acid (VITAMIN C) 1000 MG tablet Take 1,000 mg by mouth daily.     aspirin EC 81 MG tablet Take 1 tablet (81 mg total) by mouth daily. Swallow whole. 90 tablet 3   fexofenadine (ALLEGRA) 180 MG tablet Allegra Allergy     fluorouracil (EFUDEX) 5 % cream Apply a small amount to affected area every night for 2 weeks 40 g 0   fluorouracil (EFUDEX) 5 % cream Apply 1 a small amount to affected area every night Apply nightly x 2 weeks 40 g 0   fluticasone (FLONASE) 50 MCG/ACT nasal spray Place 2 sprays into both nostrils daily. 16 g 6   Omega-3 Fatty Acids (FISH OIL) 1200 MG CAPS Take 1,200 mg by mouth 2 (two) times daily.     omeprazole (PRILOSEC) 20 MG  capsule Take 1 capsule (20 mg total) by mouth daily. 90 capsule 1   rosuvastatin (CRESTOR) 40 MG tablet Take 1 tablet (40 mg total) by mouth daily. 90 tablet 3   sildenafil (VIAGRA) 100 MG tablet Take 1/2-1 tablet (50-100 mg total) by mouth daily as needed for erectile dysfunction. 10 tablet 11   No facility-administered medications prior to visit.    Allergies  Allergen Reactions   Bee Venom Swelling    ROS Review of Systems  Constitutional: Negative.   HENT:  Positive for congestion, ear pain, postnasal drip, rhinorrhea, sinus pressure, sinus pain and sore throat.   Eyes: Negative.   Respiratory:  Positive for cough. Negative for apnea, choking, chest tightness, shortness of breath, wheezing and stridor.   Cardiovascular: Negative.   Gastrointestinal: Negative.   Genitourinary: Negative.   Musculoskeletal: Negative.   Skin: Negative.   Neurological: Negative.   Psychiatric/Behavioral: Negative.       Objective:    Physical Exam Constitutional:      General: He is not in acute distress.    Appearance: Normal appearance. He is normal weight. He is not ill-appearing, toxic-appearing or diaphoretic.  Cardiovascular:     Rate and Rhythm: Normal rate and regular rhythm.     Heart sounds: Normal heart sounds. No murmur heard.   No friction rub. No gallop.  Pulmonary:     Effort: Pulmonary effort is normal. No respiratory distress.     Breath sounds: Normal breath sounds. No stridor. No wheezing, rhonchi or rales.  Chest:     Chest wall: No tenderness.  Neurological:     General: No focal deficit present.     Mental Status: He is alert and oriented to person, place, and time. Mental status is at baseline.  Psychiatric:        Mood and Affect: Mood normal.        Behavior: Behavior normal.        Thought Content: Thought content normal.        Judgment: Judgment normal.    BP (!) 160/90    Pulse 73    Temp 98.3 F (36.8 C) (Temporal)    Resp 18    Ht 5' 10.5" (1.791 m)     Wt 202 lb (91.6 kg)    SpO2 99%    BMI 28.57 kg/m  Wt Readings from Last 3 Encounters:  01/10/22 202 lb (91.6 kg)  12/07/21 198 lb 9.6 oz (90.1 kg)  10/12/21 199 lb 3.2 oz (90.4 kg)     Health Maintenance Due  Topic Date Due  Zoster Vaccines- Shingrix (1 of 2) Never done   COVID-19 Vaccine (4 - Booster for Pfizer series) 01/06/2021    There are no preventive care reminders to display for this patient.  Lab Results  Component Value Date   TSH 1.98 10/12/2021   Lab Results  Component Value Date   WBC 4.1 10/12/2021   HGB 14.5 10/12/2021   HCT 42.5 10/12/2021   MCV 87.1 10/12/2021   PLT 232.0 10/12/2021   Lab Results  Component Value Date   NA 142 10/12/2021   K 4.0 10/12/2021   CO2 28 10/12/2021   GLUCOSE 101 (H) 10/12/2021   BUN 14 10/12/2021   CREATININE 1.03 10/12/2021   BILITOT 0.5 10/12/2021   ALKPHOS 71 10/12/2021   AST 31 10/12/2021   ALT 53 10/12/2021   PROT 6.8 10/12/2021   ALBUMIN 4.5 10/12/2021   CALCIUM 9.3 10/12/2021   GFR 77.46 10/12/2021   Lab Results  Component Value Date   CHOL 202 (H) 10/12/2021   Lab Results  Component Value Date   HDL 67.80 10/12/2021   Lab Results  Component Value Date   LDLCALC 107 (H) 10/12/2021   Lab Results  Component Value Date   TRIG 135.0 10/12/2021   Lab Results  Component Value Date   CHOLHDL 3 10/12/2021   No results found for: HGBA1C    Assessment & Plan:   Problem List Items Addressed This Visit   None Visit Diagnoses     Acute bacterial sinusitis    -  Primary   Relevant Medications   amoxicillin-clavulanate (AUGMENTIN) 875-125 MG tablet   predniSONE (DELTASONE) 20 MG tablet       Meds ordered this encounter  Medications   amoxicillin-clavulanate (AUGMENTIN) 875-125 MG tablet    Sig: Take 1 tablet by mouth 2 (two) times daily.    Dispense:  20 tablet    Refill:  0    Order Specific Question:   Supervising Provider    Answer:   Carlota Raspberry, JEFFREY R [[id number]]   predniSONE (DELTASONE)  20 MG tablet    Sig: Take 1 tablet (20 mg total) by mouth daily with breakfast.    Dispense:  5 tablet    Refill:  0    Order Specific Question:   Supervising Provider    Answer:   Carlota Raspberry, JEFFREY R [[id number]]    Follow-up: Return if symptoms worsen or fail to improve.   PLAN Over one week of symptoms - suspect bacterial Tx with augmentin and prednisone burst Recommend he continue OTC relief. Return if worsening or failing to improve Patient encouraged to call clinic with any questions, comments, or concerns.  Maximiano Coss, NP

## 2022-01-10 NOTE — Patient Instructions (Addendum)
Dan Sanchez -  Doristine Devoid to meet you  Take entire course of antibiotics and prednisone even if you're feeling better.  Call if getting worse or failing to improve  Continue OTC relief  Thank you,  Rich     If you have lab work done today you will be contacted with your lab results within the next 2 weeks.  If you have not heard from [place] then please contact us. The fastest way to get your results is to register for My Chart.   IF you received an x-ray today, you will receive an invoice from [facility]. Please contact [facility] at [phone] with questions or concerns regarding your invoice.   IF you received labwork today, you will receive an invoice from [facility]. Please contact [facility] at [phone] with questions or concerns regarding your invoice.   Our billing staff will not be able to assist you with questions regarding bills from these companies.  You will be contacted with the lab results as soon as they are available. The fastest way to get your results is to activate your My Chart account. Instructions are located on the last page of this paperwork. If you have not heard from [place] regarding the results in 2 weeks, please contact this office.

## 2022-01-19 ENCOUNTER — Other Ambulatory Visit (HOSPITAL_BASED_OUTPATIENT_CLINIC_OR_DEPARTMENT_OTHER): Payer: Self-pay

## 2022-03-03 ENCOUNTER — Other Ambulatory Visit (HOSPITAL_BASED_OUTPATIENT_CLINIC_OR_DEPARTMENT_OTHER): Payer: Self-pay

## 2022-03-21 ENCOUNTER — Other Ambulatory Visit (HOSPITAL_BASED_OUTPATIENT_CLINIC_OR_DEPARTMENT_OTHER): Payer: Self-pay

## 2022-03-24 ENCOUNTER — Telehealth: Payer: Self-pay | Admitting: Family Medicine

## 2022-03-24 DIAGNOSIS — M1711 Unilateral primary osteoarthritis, right knee: Secondary | ICD-10-CM | POA: Diagnosis not present

## 2022-03-24 DIAGNOSIS — M5416 Radiculopathy, lumbar region: Secondary | ICD-10-CM

## 2022-03-24 NOTE — Telephone Encounter (Signed)
Patient left message on office [facility] ask if provider could order the Injections he got last year. ? ?- [name] LOV 03/23/21 but see pt had Facet Injections @ [facility] and that is what he wants. ? ?--Forwarding request--if any questions pls contact pt. ? ?-glh ?

## 2022-03-30 NOTE — Telephone Encounter (Signed)
Provided epidural  ? ?Rosemarie Ax, MD ?[facility] ?03/30/2022, 9:08 AM ? ?

## 2022-04-04 ENCOUNTER — Ambulatory Visit: Payer: BC Managed Care – PPO | Admitting: Family Medicine

## 2022-04-11 ENCOUNTER — Ambulatory Visit: Payer: BC Managed Care – PPO | Admitting: Family Medicine

## 2022-04-14 ENCOUNTER — Ambulatory Visit: Payer: BC Managed Care – PPO | Admitting: Family Medicine

## 2022-04-14 ENCOUNTER — Encounter: Payer: Self-pay | Admitting: Family Medicine

## 2022-04-14 ENCOUNTER — Telehealth: Payer: Self-pay

## 2022-04-14 VITALS — BP 138/82 | HR 69 | Temp 98.0°F | Resp 16 | Ht 70.5 in | Wt 195.2 lb

## 2022-04-14 DIAGNOSIS — E663 Overweight: Secondary | ICD-10-CM

## 2022-04-14 DIAGNOSIS — I10 Essential (primary) hypertension: Secondary | ICD-10-CM

## 2022-04-14 DIAGNOSIS — E785 Hyperlipidemia, unspecified: Secondary | ICD-10-CM | POA: Diagnosis not present

## 2022-04-14 LAB — BASIC METABOLIC PANEL
BUN: 24 mg/dL — ABNORMAL HIGH (ref 6–23)
CO2: 29 mEq/L (ref 19–32)
Calcium: 9.3 mg/dL (ref 8.4–10.5)
Chloride: 105 mEq/L (ref 96–112)
Creatinine, Ser: 1.05 mg/dL (ref 0.40–1.50)
GFR: 75.43 mL/min (ref >60.00)
Glucose, Bld: 94 mg/dL (ref 70–99)
Potassium: 4.2 mEq/L (ref 3.5–5.1)
Sodium: 141 mEq/L (ref 135–145)

## 2022-04-14 LAB — CBC WITH DIFFERENTIAL/PLATELET
Basophils Absolute: 0 10*3/uL (ref 0.0–0.1)
Basophils Relative: 0.9 % (ref 0.0–3.0)
Eosinophils Absolute: 0.1 10*3/uL (ref 0.0–0.7)
Eosinophils Relative: 3.4 % (ref 0.0–5.0)
HCT: 41.6 % (ref 39.0–52.0)
Hemoglobin: 14.3 g/dL (ref 13.0–17.0)
Lymphocytes Relative: 31.8 % (ref 12.0–46.0)
Lymphs Abs: 1.4 10*3/uL (ref 0.7–4.0)
MCHC: 34.4 g/dL (ref 30.0–36.0)
MCV: 88.1 fl (ref 78.0–100.0)
Monocytes Absolute: 0.3 10*3/uL (ref 0.1–1.0)
Monocytes Relative: 7.9 % (ref 3.0–12.0)
Neutro Abs: 2.4 10*3/uL (ref 1.4–7.7)
Neutrophils Relative %: 56 % (ref 43.0–77.0)
Platelets: 214 10*3/uL (ref 150.0–400.0)
RBC: 4.72 Mil/uL (ref 4.22–5.81)
RDW: 13.1 % (ref 11.5–15.5)
WBC: 4.3 10*3/uL (ref 4.0–10.5)

## 2022-04-14 LAB — LIPID PANEL
Cholesterol: 158 mg/dL (ref 0–200)
HDL: 73.2 mg/dL (ref >39.00)
LDL Cholesterol: 65 mg/dL (ref 0–99)
NonHDL: 84.91
Total CHOL/HDL Ratio: 2
Triglycerides: 100 mg/dL (ref 0.0–149.0)
VLDL: 20 mg/dL (ref 0.0–40.0)

## 2022-04-14 LAB — HEPATIC FUNCTION PANEL
ALT: 31 U/L (ref 0–53)
AST: 21 U/L (ref 0–37)
Albumin: 4.4 g/dL (ref 3.5–5.2)
Alkaline Phosphatase: 57 U/L (ref 39–117)
Bilirubin, Direct: 0.1 mg/dL (ref 0.0–0.3)
Total Bilirubin: 0.5 mg/dL (ref 0.2–1.2)
Total Protein: 6.6 g/dL (ref 6.0–8.3)

## 2022-04-14 LAB — TSH: TSH: 1.43 u[IU]/mL (ref 0.35–5.50)

## 2022-04-14 NOTE — Telephone Encounter (Signed)
-----   Message from Midge Minium, MD sent at 04/14/2022  3:53 PM EDT ----- Labs look great!  No changes at this time

## 2022-04-14 NOTE — Assessment & Plan Note (Signed)
Pt is down 7 lbs since last visit.  Applauded his efforts.  Encouraged continued attention to healthy diet and regular exercise.  Will follow.

## 2022-04-14 NOTE — Assessment & Plan Note (Signed)
Chronic problem.  Tolerating Crestor 40mg daily w/o difficulty.  Check labs.  Adjust meds prn  

## 2022-04-14 NOTE — Progress Notes (Signed)
   Subjective:    Patient ID: Dan Sanchez, male    DOB: 05/17/1956, 66 y.o.   MRN: [id number]  HPI HTN- chronic problem, on Amlodipine '10mg'$  daily w/ adequate control.  No CP, SOB, HAs, visual changes, edema.  Hyperlipidemia- chronic problem, on Crestor '40mg'$  daily.  No abd pain, N/V.  Overweight- pt is down 7 lbs since last visit.  BMI now 27.61  Pt reports he has decreased his food intake.   Review of Systems For ROS see HPI     Objective:   Physical Exam Vitals reviewed.  Constitutional:      General: He is not in acute distress.    Appearance: Normal appearance. He is well-developed. He is not ill-appearing.  HENT:     Head: Normocephalic and atraumatic.  Eyes:     Extraocular Movements: Extraocular movements intact.     Conjunctiva/sclera: Conjunctivae normal.     Pupils: Pupils are equal, round, and reactive to light.  Neck:     Thyroid: No thyromegaly.  Cardiovascular:     Rate and Rhythm: Normal rate and regular rhythm.     Pulses: Normal pulses.     Heart sounds: Normal heart sounds. No murmur heard. Pulmonary:     Effort: Pulmonary effort is normal. No respiratory distress.     Breath sounds: Normal breath sounds.  Abdominal:     General: Bowel sounds are normal. There is no distension.     Palpations: Abdomen is soft.  Musculoskeletal:     Cervical back: Normal range of motion and neck supple.     Right lower leg: No edema.     Left lower leg: No edema.  Lymphadenopathy:     Cervical: No cervical adenopathy.  Skin:    General: Skin is warm and dry.  Neurological:     General: No focal deficit present.     Mental Status: He is alert and oriented to person, place, and time.     Cranial Nerves: No cranial nerve deficit.  Psychiatric:        Mood and Affect: Mood normal.        Behavior: Behavior normal.          Assessment & Plan:

## 2022-04-14 NOTE — Assessment & Plan Note (Signed)
Chronic problem.  Adequate control on Amlodipine '10mg'$  daily.  Currently asymptomatic.  No med changes at this time

## 2022-04-14 NOTE — Telephone Encounter (Signed)
Spoke w/ pt and advised pt of lab results  

## 2022-04-14 NOTE — Patient Instructions (Signed)
Schedule your complete physical in 6 months We'll notify you of your lab results and make any changes if needed Continue to work on healthy diet and regular exercise- you're doing great!!! Call with any questions or concerns Stay Safe! Stay Healthy! Have a great summer!!!

## 2022-04-15 ENCOUNTER — Other Ambulatory Visit (HOSPITAL_BASED_OUTPATIENT_CLINIC_OR_DEPARTMENT_OTHER): Payer: Self-pay

## 2022-04-15 ENCOUNTER — Other Ambulatory Visit: Payer: Self-pay | Admitting: Family Medicine

## 2022-04-15 MED ORDER — OMEPRAZOLE 20 MG PO CPDR
20.0000 mg | DELAYED_RELEASE_CAPSULE | Freq: Every day | ORAL | 1 refills | Status: DC
  Filled 2022-04-15: qty 90, 90d supply, fill #0
  Filled 2022-07-26: qty 90, 90d supply, fill #1

## 2022-04-27 ENCOUNTER — Ambulatory Visit
Admission: RE | Admit: 2022-04-27 | Discharge: 2022-04-27 | Disposition: A | Discharge status: Home / Self Care | Payer: BC Managed Care – PPO | Source: Ambulatory Visit | Attending: Family Medicine | Admitting: Family Medicine

## 2022-04-27 DIAGNOSIS — M5416 Radiculopathy, lumbar region: Secondary | ICD-10-CM

## 2022-04-27 DIAGNOSIS — M47817 Spondylosis without myelopathy or radiculopathy, lumbosacral region: Secondary | ICD-10-CM | POA: Diagnosis not present

## 2022-04-27 DIAGNOSIS — M48061 Spinal stenosis, lumbar region without neurogenic claudication: Secondary | ICD-10-CM | POA: Diagnosis not present

## 2022-04-27 MED ORDER — METHYLPREDNISOLONE ACETATE 40 MG/ML INJ SUSP (RADIOLOG
80.0000 mg | Freq: Once | INTRAMUSCULAR | Status: AC
  Administered 2022-04-27: 80 mg via EPIDURAL

## 2022-04-27 MED ORDER — IOPAMIDOL (ISOVUE-M 200) INJECTION 41%
1.0000 mL | Freq: Once | INTRAMUSCULAR | Status: AC
  Administered 2022-04-27: 1 mL via EPIDURAL

## 2022-04-27 NOTE — Discharge Instructions (Signed)

## 2022-05-13 ENCOUNTER — Ambulatory Visit: Payer: 59 | Admitting: Podiatry

## 2022-05-19 ENCOUNTER — Encounter: Payer: Self-pay | Admitting: Podiatry

## 2022-05-19 ENCOUNTER — Ambulatory Visit (INDEPENDENT_AMBULATORY_CARE_PROVIDER_SITE_OTHER): Payer: BC Managed Care – PPO | Admitting: Podiatry

## 2022-05-19 ENCOUNTER — Ambulatory Visit (INDEPENDENT_AMBULATORY_CARE_PROVIDER_SITE_OTHER): Payer: BC Managed Care – PPO

## 2022-05-19 DIAGNOSIS — M7752 Other enthesopathy of left foot: Secondary | ICD-10-CM | POA: Diagnosis not present

## 2022-05-19 DIAGNOSIS — M779 Enthesopathy, unspecified: Secondary | ICD-10-CM

## 2022-05-19 DIAGNOSIS — M79672 Pain in left foot: Secondary | ICD-10-CM | POA: Diagnosis not present

## 2022-05-19 MED ORDER — TRIAMCINOLONE ACETONIDE 10 MG/ML IJ SUSP
10.0000 mg | Freq: Once | INTRAMUSCULAR | Status: AC
  Administered 2022-05-19: 10 mg

## 2022-05-19 NOTE — Progress Notes (Unsigned)
Subjective:   Patient ID: Dan Sanchez, male   DOB: 66 y.o.   MRN: [id number]   HPI Patient presents stating that he has had pain in the left foot and does not remember specific injury.  States its been bothering him for a little while and is more noticeable with activity and has not improved with just time.  Patient does not smoke does like to be active and plays golf   Review of Systems  All other systems reviewed and are negative.       Objective:  Physical Exam Vitals and nursing note reviewed.  Constitutional:      Appearance: He is well-developed.  Pulmonary:     Effort: Pulmonary effort is normal.  Musculoskeletal:        General: Normal range of motion.  Skin:    General: Skin is warm.  Neurological:     Mental Status: He is alert.     Neurovascular status intact muscle strength found to be adequate range of motion adequate.  Patient is noted to have inflammation in the forefoot left mostly around the third metatarsal phalangeal joint with fluid around the joint surface of a moderate nature.  Mild discomfort second MPJ not to the same degree and patient has good digital perfusion well oriented x3 and flat     Assessment:  Inflammatory capsulitis most likely third MPJ left with probable subtle trauma condition     Plan:  H&P x-rays reviewed and reviewed condition.  I recommended aspiration with injection he wants this done understanding risk and today I did a proximal nerve block sterile prep done and using sterile instrumentation aspirated the third MPJ getting out a small amount of clear fluid and injected with quarter cc dexamethasone Kenalog applied thick plantar padding to reduce pressure on the joint surface and patient will be seen back to recheck as needed and will wear rigid bottom shoes  X-rays were negative for signs of fracture or signs of arthritis around the joint surface

## 2022-05-19 NOTE — Progress Notes (Deleted)
Complete

## 2022-05-23 ENCOUNTER — Other Ambulatory Visit (HOSPITAL_BASED_OUTPATIENT_CLINIC_OR_DEPARTMENT_OTHER): Payer: Self-pay

## 2022-05-24 ENCOUNTER — Other Ambulatory Visit: Payer: Self-pay | Admitting: Podiatry

## 2022-05-24 DIAGNOSIS — M779 Enthesopathy, unspecified: Secondary | ICD-10-CM

## 2022-06-02 ENCOUNTER — Other Ambulatory Visit (HOSPITAL_BASED_OUTPATIENT_CLINIC_OR_DEPARTMENT_OTHER): Payer: Self-pay

## 2022-06-02 MED ORDER — AMOXICILLIN 500 MG PO CAPS
ORAL_CAPSULE | ORAL | 1 refills | Status: DC
  Filled 2022-06-02: qty 21, 7d supply, fill #0

## 2022-06-06 ENCOUNTER — Other Ambulatory Visit (HOSPITAL_BASED_OUTPATIENT_CLINIC_OR_DEPARTMENT_OTHER): Payer: Self-pay

## 2022-06-06 MED ORDER — AMOXICILLIN-POT CLAVULANATE 875-125 MG PO TABS
ORAL_TABLET | ORAL | 1 refills | Status: DC
  Filled 2022-06-06: qty 20, 10d supply, fill #0

## 2022-07-13 ENCOUNTER — Ambulatory Visit: Payer: No Typology Code available for payment source | Admitting: Podiatry

## 2022-07-24 ENCOUNTER — Encounter: Payer: Self-pay | Admitting: Family Medicine

## 2022-07-25 ENCOUNTER — Ambulatory Visit (INDEPENDENT_AMBULATORY_CARE_PROVIDER_SITE_OTHER): Payer: No Typology Code available for payment source | Admitting: Family Medicine

## 2022-07-25 DIAGNOSIS — Z23 Encounter for immunization: Secondary | ICD-10-CM

## 2022-07-25 NOTE — Progress Notes (Signed)
Pt received his TDAP vaccine today . Tolerated well

## 2022-07-26 ENCOUNTER — Other Ambulatory Visit (HOSPITAL_BASED_OUTPATIENT_CLINIC_OR_DEPARTMENT_OTHER): Payer: Self-pay

## 2022-07-28 ENCOUNTER — Ambulatory Visit: Payer: No Typology Code available for payment source | Admitting: Podiatry

## 2022-08-04 ENCOUNTER — Ambulatory Visit (INDEPENDENT_AMBULATORY_CARE_PROVIDER_SITE_OTHER): Payer: No Typology Code available for payment source | Admitting: Podiatry

## 2022-08-04 ENCOUNTER — Encounter: Payer: Self-pay | Admitting: Podiatry

## 2022-08-04 DIAGNOSIS — M79672 Pain in left foot: Secondary | ICD-10-CM

## 2022-08-04 DIAGNOSIS — G629 Polyneuropathy, unspecified: Secondary | ICD-10-CM | POA: Diagnosis not present

## 2022-08-05 NOTE — Progress Notes (Signed)
Subjective:   Patient ID: Dan Sanchez, male   DOB: 66 y.o.   MRN: [id number]   HPI Patient states he is not getting the same pain he used to get but he gets some numbing at the end of his toes and he is having injections for his back currently   ROS      Objective:  Physical Exam  Neurovascular status intact with slight numbness to the ends of the third and fourth toes of the left foot with no indications of inflammatory condition currently or other pathology     Assessment:  Possibility for low-grade neuroma that is not causing burning tingling versus possibility for an issue with the back with a negative Biagio Borg sign     Plan:  Reviewed condition do not recommend current treatment and if it were to get worse he will have to have his back evaluated and I do not see current indications of neuroma.  Educated him he will come back as needed

## 2022-08-10 ENCOUNTER — Telehealth: Payer: Self-pay | Admitting: *Deleted

## 2022-08-10 DIAGNOSIS — M5416 Radiculopathy, lumbar region: Secondary | ICD-10-CM

## 2022-08-10 NOTE — Telephone Encounter (Signed)
-----   Message from Elberta Spaniel sent at 08/10/2022  1:54 PM EDT ----- Regarding: Patient request another  Epidural Pt called ask for provider to Order more Epidural injections for him pain has returned & the Epi worked the 1st time .   Pt says went to [facility] for others

## 2022-08-10 NOTE — Telephone Encounter (Signed)
Epidural order placed.

## 2022-08-17 ENCOUNTER — Ambulatory Visit
Admission: RE | Admit: 2022-08-17 | Discharge: 2022-08-17 | Disposition: A | Discharge status: Home / Self Care | Payer: No Typology Code available for payment source | Source: Ambulatory Visit | Attending: Family Medicine | Admitting: Family Medicine

## 2022-08-17 DIAGNOSIS — M5416 Radiculopathy, lumbar region: Secondary | ICD-10-CM

## 2022-08-17 DIAGNOSIS — M47817 Spondylosis without myelopathy or radiculopathy, lumbosacral region: Secondary | ICD-10-CM | POA: Diagnosis not present

## 2022-08-17 MED ORDER — IOPAMIDOL (ISOVUE-M 200) INJECTION 41%
1.0000 mL | Freq: Once | INTRAMUSCULAR | Status: AC
  Administered 2022-08-17: 1 mL via EPIDURAL

## 2022-08-17 MED ORDER — METHYLPREDNISOLONE ACETATE 40 MG/ML INJ SUSP (RADIOLOG
80.0000 mg | Freq: Once | INTRAMUSCULAR | Status: AC
  Administered 2022-08-17: 80 mg via EPIDURAL

## 2022-08-17 NOTE — Discharge Instructions (Signed)

## 2022-09-07 ENCOUNTER — Other Ambulatory Visit (HOSPITAL_BASED_OUTPATIENT_CLINIC_OR_DEPARTMENT_OTHER): Payer: Self-pay

## 2022-10-13 ENCOUNTER — Ambulatory Visit: Payer: BC Managed Care – PPO | Admitting: Family Medicine

## 2022-10-13 ENCOUNTER — Encounter: Payer: Self-pay | Admitting: Family Medicine

## 2022-10-13 ENCOUNTER — Other Ambulatory Visit (HOSPITAL_BASED_OUTPATIENT_CLINIC_OR_DEPARTMENT_OTHER): Payer: Self-pay

## 2022-10-13 VITALS — BP 138/84 | HR 95 | Temp 98.5°F | Resp 18 | Ht 70.5 in | Wt 201.0 lb

## 2022-10-13 DIAGNOSIS — R0981 Nasal congestion: Secondary | ICD-10-CM

## 2022-10-13 LAB — POC COVID19 BINAXNOW: SARS Coronavirus 2 Ag: NEGATIVE

## 2022-10-13 MED ORDER — AMOXICILLIN 875 MG PO TABS
875.0000 mg | ORAL_TABLET | Freq: Two times a day (BID) | ORAL | 0 refills | Status: AC
  Filled 2022-10-13: qty 20, 10d supply, fill #0

## 2022-10-13 NOTE — Patient Instructions (Signed)
Follow up as needed or as scheduled START the Amoxicillin twice daily- take w/ food Drink LOTS of fluids Continue daily Claritin or Zyrtec until feeling better Call with any questions or concerns Stay Safe!  Stay Healthy! Happy Holidays!!!

## 2022-10-13 NOTE — Progress Notes (Signed)
   Subjective:    Patient ID: Dan Sanchez, male    DOB: Jan 28, 1956, 66 y.o.   MRN: [id number]  HPI Congestion- 'i think I have a sinus infection'.  Sxs started Monday w/ HA, runny nose, congestion, fatigue.  No fevers.  No body aches or chills.  + sinus pain.  L ear pressure.  No tooth pain.  No N/V/D.  + cough.  COVID tests were negative on Monday and Tuesday.  + sick contacts.   Review of Systems For ROS see HPI     Objective:   Physical Exam Vitals reviewed.  Constitutional:      General: He is not in acute distress.    Appearance: Normal appearance. He is well-developed. He is not ill-appearing.  HENT:     Head: Normocephalic and atraumatic.     Right Ear: Tympanic membrane normal.     Left Ear: Tympanic membrane normal.     Nose: Mucosal edema and rhinorrhea present.     Right Sinus: Maxillary sinus tenderness and frontal sinus tenderness present.     Left Sinus: Maxillary sinus tenderness and frontal sinus tenderness present.     Mouth/Throat:     Pharynx: Oropharyngeal exudate and posterior oropharyngeal erythema present.  Eyes:     Conjunctiva/sclera: Conjunctivae normal.     Pupils: Pupils are equal, round, and reactive to light.  Cardiovascular:     Rate and Rhythm: Normal rate and regular rhythm.     Heart sounds: Normal heart sounds.  Pulmonary:     Effort: Pulmonary effort is normal. No respiratory distress.     Breath sounds: Normal breath sounds. No wheezing.  Musculoskeletal:     Cervical back: Normal range of motion and neck supple.  Lymphadenopathy:     Cervical: No cervical adenopathy.  Skin:    General: Skin is warm and dry.  Neurological:     General: No focal deficit present.     Mental Status: He is alert and oriented to person, place, and time.           Assessment & Plan:  Head congestion- pt's sxs and PE consistent w/ sinus infxn.  Start Amoxicillin.  Reviewed supportive care and red flags that should prompt return.  Pt expressed  understanding and is in agreement w/ plan.

## 2022-10-14 ENCOUNTER — Other Ambulatory Visit (HOSPITAL_BASED_OUTPATIENT_CLINIC_OR_DEPARTMENT_OTHER): Payer: Self-pay

## 2022-10-14 ENCOUNTER — Other Ambulatory Visit: Payer: Self-pay | Admitting: Family Medicine

## 2022-10-14 MED ORDER — OMEPRAZOLE 20 MG PO CPDR
20.0000 mg | DELAYED_RELEASE_CAPSULE | Freq: Every day | ORAL | 1 refills | Status: DC
  Filled 2022-10-14: qty 90, 90d supply, fill #0
  Filled 2023-01-20: qty 90, 90d supply, fill #1

## 2022-10-19 ENCOUNTER — Encounter: Payer: BC Managed Care – PPO | Admitting: Family Medicine

## 2022-10-24 ENCOUNTER — Encounter: Payer: Self-pay | Admitting: Family Medicine

## 2022-11-09 ENCOUNTER — Other Ambulatory Visit: Payer: BC Managed Care – PPO

## 2022-11-09 ENCOUNTER — Ambulatory Visit (INDEPENDENT_AMBULATORY_CARE_PROVIDER_SITE_OTHER): Payer: BC Managed Care – PPO | Admitting: Family Medicine

## 2022-11-09 ENCOUNTER — Encounter: Payer: Self-pay | Admitting: Family Medicine

## 2022-11-09 VITALS — BP 136/76 | HR 73 | Temp 99.0°F | Resp 17 | Ht 70.5 in | Wt 200.0 lb

## 2022-11-09 DIAGNOSIS — I10 Essential (primary) hypertension: Secondary | ICD-10-CM | POA: Diagnosis not present

## 2022-11-09 DIAGNOSIS — Z0000 Encounter for general adult medical examination without abnormal findings: Secondary | ICD-10-CM

## 2022-11-09 DIAGNOSIS — Z125 Encounter for screening for malignant neoplasm of prostate: Secondary | ICD-10-CM | POA: Diagnosis not present

## 2022-11-09 NOTE — Progress Notes (Signed)
   Subjective:    Patient ID: Dan Sanchez, male    DOB: 1956/06/09, 66 y.o.   MRN: [id number]  HPI CPE- UTD on colonoscopy and Tdap.  Declines flu.  Patient Care Team    Relationship Specialty Notifications Start End  Midge Minium, MD PCP - General Family Medicine  07/25/22   Burnell Blanks, MD PCP - Cardiology Cardiology Admissions 07/16/18   Sharmon Revere Physician Assistant Physician Assistant  04/06/12   Burnell Blanks, MD Consulting Physician Cardiology  07/15/15     Health Maintenance  Topic Date Due   INFLUENZA VACCINE  02/26/2023 (Originally 06/28/2022)   COLONOSCOPY (Pts 45-27yr Insurance coverage will need to be confirmed)  05/26/2029   DTaP/Tdap/Td (4 - Td or Tdap) 07/25/2032   Hepatitis C Screening  Completed   HIV Screening  Completed   HPV VACCINES  Aged Out   COVID-19 Vaccine  Discontinued   Zoster Vaccines- Shingrix  Discontinued      Review of Systems Patient reports no vision/hearing changes, anorexia, fever ,adenopathy, persistant/recurrent hoarseness, swallowing issues, chest pain, palpitations, edema, persistant/recurrent cough, hemoptysis, dyspnea (rest,exertional, paroxysmal nocturnal), gastrointestinal  bleeding (melena, rectal bleeding), abdominal pain, excessive heart burn, GU symptoms (dysuria, hematuria, voiding/incontinence issues) syncope, focal weakness, memory loss, numbness & tingling, skin/hair/nail changes, depression, anxiety, abnormal bruising/bleeding, musculoskeletal symptoms/signs.     Objective:   Physical Exam General Appearance:    Alert, cooperative, no distress, appears stated age  Head:    Normocephalic, without obvious abnormality, atraumatic  Eyes:    PERRL, conjunctiva/corneas clear, EOM's intact both eyes       Ears:    Normal TM's and external ear canals, both ears  Nose:   Nares normal, septum midline, mucosa normal, no drainage   or sinus tenderness  Throat:   Lips, mucosa, and tongue normal;  teeth and gums normal  Neck:   Supple, symmetrical, trachea midline, no adenopathy;       thyroid:  No enlargement/tenderness/nodules  Back:     Symmetric, no curvature, ROM normal, no CVA tenderness  Lungs:     Clear to auscultation bilaterally, respirations unlabored  Chest wall:    No tenderness or deformity  Heart:    Regular rate and rhythm, S1 and S2 normal, no murmur, rub   or gallop  Abdomen:     Soft, non-tender, bowel sounds active all four quadrants,    no masses, no organomegaly  Genitalia:    deferred  Rectal:    Extremities:   Extremities normal, atraumatic, no cyanosis or edema  Pulses:   2+ and symmetric all extremities  Skin:   Skin color, texture, turgor normal, no rashes or lesions  Lymph nodes:   Cervical, supraclavicular, and axillary nodes normal  Neurologic:   CNII-XII intact. Normal strength, sensation and reflexes      throughout          Assessment & Plan:

## 2022-11-09 NOTE — Patient Instructions (Signed)
Follow up in 6 months to recheck BP and cholesterol Go to [address] to have labs or schedule a lab visit at your convenience We'll notify you of your lab results and make any changes if needed Continue to work on healthy diet and regular exercise- you're doing great! Call with any questions or concerns Stay Safe!  Stay Healthy! Happy Holidays!!!

## 2022-11-10 ENCOUNTER — Other Ambulatory Visit (INDEPENDENT_AMBULATORY_CARE_PROVIDER_SITE_OTHER): Payer: BC Managed Care – PPO

## 2022-11-10 ENCOUNTER — Telehealth: Payer: Self-pay

## 2022-11-10 DIAGNOSIS — I10 Essential (primary) hypertension: Secondary | ICD-10-CM | POA: Diagnosis not present

## 2022-11-10 DIAGNOSIS — Z125 Encounter for screening for malignant neoplasm of prostate: Secondary | ICD-10-CM | POA: Diagnosis not present

## 2022-11-10 LAB — HEPATIC FUNCTION PANEL
ALT: 33 U/L (ref 0–53)
AST: 21 U/L (ref 0–37)
Albumin: 4.5 g/dL (ref 3.5–5.2)
Alkaline Phosphatase: 54 U/L (ref 39–117)
Bilirubin, Direct: 0.1 mg/dL (ref 0.0–0.3)
Total Bilirubin: 0.5 mg/dL (ref 0.2–1.2)
Total Protein: 6.7 g/dL (ref 6.0–8.3)

## 2022-11-10 LAB — BASIC METABOLIC PANEL
BUN: 17 mg/dL (ref 6–23)
CO2: 30 mEq/L (ref 19–32)
Calcium: 9.5 mg/dL (ref 8.4–10.5)
Chloride: 105 mEq/L (ref 96–112)
Creatinine, Ser: 1.1 mg/dL (ref 0.40–1.50)
GFR: 71.05 mL/min (ref >60.00)
Glucose, Bld: 105 mg/dL — ABNORMAL HIGH (ref 70–99)
Potassium: 4.1 mEq/L (ref 3.5–5.1)
Sodium: 142 mEq/L (ref 135–145)

## 2022-11-10 LAB — LIPID PANEL
Cholesterol: 145 mg/dL (ref 0–200)
HDL: 58.4 mg/dL (ref >39.00)
LDL Cholesterol: 67 mg/dL (ref 0–99)
NonHDL: 86.36
Total CHOL/HDL Ratio: 2
Triglycerides: 97 mg/dL (ref 0.0–149.0)
VLDL: 19.4 mg/dL (ref 0.0–40.0)

## 2022-11-10 LAB — CBC WITH DIFFERENTIAL/PLATELET
Basophils Absolute: 0.1 10*3/uL (ref 0.0–0.1)
Basophils Relative: 1.3 % (ref 0.0–3.0)
Eosinophils Absolute: 0.3 10*3/uL (ref 0.0–0.7)
Eosinophils Relative: 6.7 % — ABNORMAL HIGH (ref 0.0–5.0)
HCT: 42.7 % (ref 39.0–52.0)
Hemoglobin: 14.9 g/dL (ref 13.0–17.0)
Lymphocytes Relative: 33.1 % (ref 12.0–46.0)
Lymphs Abs: 1.5 10*3/uL (ref 0.7–4.0)
MCHC: 34.8 g/dL (ref 30.0–36.0)
MCV: 87.8 fl (ref 78.0–100.0)
Monocytes Absolute: 0.4 10*3/uL (ref 0.1–1.0)
Monocytes Relative: 8.9 % (ref 3.0–12.0)
Neutro Abs: 2.3 10*3/uL (ref 1.4–7.7)
Neutrophils Relative %: 50 % (ref 43.0–77.0)
Platelets: 245 10*3/uL (ref 150.0–400.0)
RBC: 4.86 Mil/uL (ref 4.22–5.81)
RDW: 13.4 % (ref 11.5–15.5)
WBC: 4.6 10*3/uL (ref 4.0–10.5)

## 2022-11-10 LAB — PSA: PSA: 1.79 ng/mL (ref 0.10–4.00)

## 2022-11-10 LAB — TSH: TSH: 1.88 u[IU]/mL (ref 0.35–5.50)

## 2022-11-10 NOTE — Assessment & Plan Note (Signed)
Chronic problem.  Adequate control.  Currently asymptomatic.  Check labs.  No anticipated med changes.  Will follow.

## 2022-11-10 NOTE — Telephone Encounter (Signed)
Pt seen results Via my chart  

## 2022-11-10 NOTE — Telephone Encounter (Signed)
-----   Message from Midge Minium, MD sent at 11/10/2022  3:27 PM EST ----- Labs look great!  No changes at this time

## 2022-11-10 NOTE — Assessment & Plan Note (Signed)
[name] PE WNL w/ exception of BMI.  UTD on colonoscopy and Tdap.  Declines flu.  Check labs.  Anticipatory guidance provided.

## 2022-11-29 ENCOUNTER — Encounter: Payer: Self-pay | Admitting: Family Medicine

## 2022-11-29 DIAGNOSIS — R0683 Snoring: Secondary | ICD-10-CM

## 2022-12-02 ENCOUNTER — Other Ambulatory Visit (HOSPITAL_BASED_OUTPATIENT_CLINIC_OR_DEPARTMENT_OTHER): Payer: Self-pay

## 2022-12-02 ENCOUNTER — Other Ambulatory Visit: Payer: Self-pay | Admitting: Cardiovascular Disease

## 2022-12-02 DIAGNOSIS — M1711 Unilateral primary osteoarthritis, right knee: Secondary | ICD-10-CM | POA: Diagnosis not present

## 2022-12-02 MED ORDER — AMLODIPINE BESYLATE 10 MG PO TABS
10.0000 mg | ORAL_TABLET | Freq: Every day | ORAL | 3 refills | Status: DC
  Filled 2022-12-02: qty 90, 90d supply, fill #0
  Filled 2023-03-02: qty 90, 90d supply, fill #1
  Filled 2023-06-06: qty 90, 90d supply, fill #2
  Filled 2023-09-20: qty 90, 90d supply, fill #3

## 2022-12-02 MED ORDER — ROSUVASTATIN CALCIUM 40 MG PO TABS
40.0000 mg | ORAL_TABLET | Freq: Every day | ORAL | 3 refills | Status: DC
  Filled 2022-12-02: qty 90, 90d supply, fill #0
  Filled 2023-03-02: qty 90, 90d supply, fill #1
  Filled 2023-06-06: qty 90, 90d supply, fill #2
  Filled 2023-09-20: qty 90, 90d supply, fill #3

## 2022-12-05 ENCOUNTER — Other Ambulatory Visit (HOSPITAL_BASED_OUTPATIENT_CLINIC_OR_DEPARTMENT_OTHER): Payer: Self-pay

## 2022-12-15 DIAGNOSIS — I2510 Atherosclerotic heart disease of native coronary artery without angina pectoris: Secondary | ICD-10-CM | POA: Insufficient documentation

## 2022-12-15 NOTE — Progress Notes (Signed)
  Cardiology Office Note:    Date:  12/16/2022   ID:  EASTIN SWING, DOB 02-22-1956, MRN [id number]  PCP:  Midge Minium, MD  [place] [facility] Cardiologist:  Lauree Chandler, MD     Referring MD: Midge Minium, MD   Patient Profile: Coronary artery disease  Mild non-obs CAD by cath in 2015 [facility] 08/13/2014: Mid LAD 20, proximal RCA 20, EF 65-70 Hyperlipidemia  Hypertension      History of Present Illness:   Dan Sanchez is a 67 y.o. male with the above problem list.  He was last seen by Dr. Angelena Form 12/07/2021.  He returns for follow-up of coronary artery disease. He is here alone. He is doing well w/o chest pain, shortness of breath, edema, syncope.       EKG: NSR, HR 71, normal axis, no ST-TW changes, QTc 441 ms      Reviewed and updated this encounter:   Tobacco  Allergies  Meds  Problems  Med Hx  Surg Hx  Fam Hx     Review of Systems  Cardiovascular:  Negative for claudication.    Labs/Other Test Reviewed:   Recent Labs: 11/10/2022: ALT 33; BUN 17; Creatinine, Ser 1.10; Hemoglobin 14.9; Platelets 245.0; Potassium 4.1; Sodium 142; TSH 1.88   Recent Lipid Panel Recent Labs    11/10/22 0827  CHOL 145  TRIG 97.0  HDL 58.40  VLDL 19.4  LDLCALC 67     Risk Assessment/Calculations/Metrics:             Physical Exam:   VS:  BP 136/88   Pulse 71   Ht 5' 10.5" (1.791 m)   Wt 199 lb 3.2 oz (90.4 kg)   SpO2 97%   BMI 28.18 kg/m    Wt Readings from Last 3 Encounters:  12/16/22 199 lb 3.2 oz (90.4 kg)  11/09/22 200 lb (90.7 kg)  10/13/22 201 lb (91.2 kg)    Constitutional:      Appearance: Healthy appearance. Not in distress.  Neck:     Vascular: No carotid bruit. JVD normal.  Pulmonary:     Effort: Pulmonary effort is normal.     Breath sounds: No wheezing. No rales.  Cardiovascular:     Normal rate. Regular rhythm. Normal S1. Normal S2.      Murmurs: There is no murmur.  Edema:    Peripheral edema absent.  Abdominal:      Palpations: Abdomen is soft.         ASSESSMENT & PLAN:   Coronary artery disease involving native coronary artery of native heart without angina pectoris Mild non-obstructive CAD on cath in 2015. He is doing well without angina. He remains on ASA, statin Rx. F/u in 1 year.  Hyperlipidemia Recent LDL optimal on current dose of Rosuvastatin.   HTN (hypertension) BP somewhat elevated. He is currently taking Amlodipine 10 mg once daily. I will give him a low salt diet. I have asked him to monitor his BP and send readings in 2 weeks.         Dispo:  Return in about 1 year (around 12/17/2023) for Routine Follow Up with Dr. Angelena Form.   Signed, Richardson Dopp, PA-C  12/16/2022 9:07 AM    [facility], [place], [place]  [address] Phone: [phone]; Fax: [phone]

## 2022-12-16 ENCOUNTER — Ambulatory Visit: Payer: BC Managed Care – PPO | Attending: Physician Assistant | Admitting: Physician Assistant

## 2022-12-16 ENCOUNTER — Encounter: Payer: Self-pay | Admitting: Physician Assistant

## 2022-12-16 VITALS — BP 136/88 | HR 71 | Ht 70.5 in | Wt 199.2 lb

## 2022-12-16 DIAGNOSIS — I10 Essential (primary) hypertension: Secondary | ICD-10-CM | POA: Diagnosis not present

## 2022-12-16 DIAGNOSIS — E7800 Pure hypercholesterolemia, unspecified: Secondary | ICD-10-CM | POA: Diagnosis not present

## 2022-12-16 DIAGNOSIS — I2510 Atherosclerotic heart disease of native coronary artery without angina pectoris: Secondary | ICD-10-CM

## 2022-12-16 NOTE — Assessment & Plan Note (Signed)
Recent LDL optimal on current dose of Rosuvastatin.

## 2022-12-16 NOTE — Patient Instructions (Addendum)
Medication Instructions:  Your physician recommends that you continue on your current medications as directed. Please refer to the Current Medication list given to you today.  *If you need a refill on your cardiac medications before your next appointment, please call your pharmacy*   Lab Work: None ordered  If you have labs (blood work) drawn today and your tests are completely normal, you will receive your results only by: [place] (if you have MyChart) OR A paper copy in the mail If you have any lab test that is abnormal or we need to change your treatment, we will call you to review the results.   Testing/Procedures: None ordered   Follow-Up: At [facility], you and your health needs are our priority.  As part of our continuing mission to provide you with exceptional heart care, we have created designated Provider Care Teams.  These Care Teams include your primary Cardiologist (physician) and Advanced Practice Providers (APPs -  Physician Assistants and Nurse Practitioners) who all work together to provide you with the care you need, when you need it.  We recommend signing up for the patient portal called "MyChart".  Sign up information is provided on this After Visit Summary.  MyChart is used to connect with patients for Virtual Visits (Telemedicine).  Patients are able to view lab/test results, encounter notes, upcoming appointments, etc.  Non-urgent messages can be sent to your provider as well.   To learn more about what you can do with MyChart, go to [email or web address].    Your next appointment:   1 year(s)  Provider:   Lauree Chandler, MD     Other Instructions  Your physician has requested that you regularly monitor and record your blood pressure readings at home. Please use the same machine at the same time of day to check your readings and record them to bring to your follow-up visit.   Please monitor blood pressures and keep a log of your  readings for 2 weeks then send a message to [place] on mychart with them.     Make sure to check 2 hours after your medications.    AVOID these things for 30 minutes before checking your blood pressure: No Drinking caffeine. No Drinking alcohol. No Eating. No Smoking. No Exercising.   Five minutes before checking your blood pressure: Pee. Sit in a dining chair. Avoid sitting in a soft couch or armchair. Be quiet. Do not talk   Low-Sodium Eating Plan Sodium, which is an element that makes up salt, helps you maintain a healthy balance of fluids in your body. Too much sodium can increase your blood pressure and cause fluid and waste to be held in your body. Your health care provider or dietitian may recommend following this plan if you have high blood pressure (hypertension), kidney disease, liver disease, or heart failure. Eating less sodium can help lower your blood pressure, reduce swelling, and protect your heart, liver, and kidneys. What are tips for following this plan? Reading food labels The Nutrition Facts label lists the amount of sodium in one serving of the food. If you eat more than one serving, you must multiply the listed amount of sodium by the number of servings. Choose foods with less than 140 mg of sodium per serving. Avoid foods with 300 mg of sodium or more per serving. Shopping  Look for lower-sodium products, often labeled as "low-sodium" or "no salt added." Always check the sodium content, even if foods are labeled as "unsalted" or "no  salt added." Buy fresh foods. Avoid canned foods and pre-made or frozen meals. Avoid canned, cured, or processed meats. Buy breads that have less than 80 mg of sodium per slice. Cooking  Eat more home-cooked food and less restaurant, buffet, and fast food. Avoid adding salt when cooking. Use salt-free seasonings or herbs instead of table salt or sea salt. Check with your health care provider or pharmacist before using salt  substitutes. Cook with plant-based oils, such as canola, sunflower, or olive oil. Meal planning When eating at a restaurant, ask that your food be prepared with less salt or no salt, if possible. Avoid dishes labeled as brined, pickled, cured, smoked, or made with soy sauce, miso, or teriyaki sauce. Avoid foods that contain MSG (monosodium glutamate). MSG is sometimes added to [place] food, bouillon, and some canned foods. Make meals that can be grilled, baked, poached, roasted, or steamed. These are generally made with less sodium. General information Most people on this plan should limit their sodium intake to 1,500-2,000 mg (milligrams) of sodium each day. What foods should I eat? Fruits Fresh, frozen, or canned fruit. Fruit juice. Vegetables Fresh or frozen vegetables. "No salt added" canned vegetables. "No salt added" tomato sauce and paste. Low-sodium or reduced-sodium tomato and vegetable juice. Grains Low-sodium cereals, including oats, puffed wheat and rice, and shredded wheat. Low-sodium crackers. Unsalted rice. Unsalted pasta. Low-sodium bread. Whole-grain breads and whole-grain pasta. Meats and other proteins Fresh or frozen (no salt added) meat, poultry, seafood, and fish. Low-sodium canned tuna and salmon. Unsalted nuts. Dried peas, beans, and lentils without added salt. Unsalted canned beans. Eggs. Unsalted nut butters. Dairy Milk. Soy milk. Cheese that is naturally low in sodium, such as ricotta cheese, fresh mozzarella, or Swiss cheese. Low-sodium or reduced-sodium cheese. Cream cheese. Yogurt. Seasonings and condiments Fresh and dried herbs and spices. Salt-free seasonings. Low-sodium mustard and ketchup. Sodium-free salad dressing. Sodium-free light mayonnaise. Fresh or refrigerated horseradish. Lemon juice. Vinegar. Other foods Homemade, reduced-sodium, or low-sodium soups. Unsalted popcorn and pretzels. Low-salt or salt-free chips. The items listed above may not be a  complete list of foods and beverages you can eat. Contact a dietitian for more information. What foods should I avoid? Vegetables Sauerkraut, pickled vegetables, and relishes. Olives. [place] fries. Onion rings. Regular canned vegetables (not low-sodium or reduced-sodium). Regular canned tomato sauce and paste (not low-sodium or reduced-sodium). Regular tomato and vegetable juice (not low-sodium or reduced-sodium). Frozen vegetables in sauces. Grains Instant hot cereals. Bread stuffing, pancake, and biscuit mixes. Croutons. Seasoned rice or pasta mixes. Noodle soup cups. Boxed or frozen macaroni and cheese. Regular salted crackers. Self-rising flour. Meats and other proteins Meat or fish that is salted, canned, smoked, spiced, or pickled. Precooked or cured meat, such as sausages or meat loaves. [place] Salines. Ham. Pepperoni. Hot dogs. Corned beef. Chipped beef. Salt pork. Jerky. Pickled herring. Anchovies and sardines. Regular canned tuna. Salted nuts. Dairy Processed cheese and cheese spreads. Hard cheeses. Cheese curds. Blue cheese. Feta cheese. String cheese. Regular cottage cheese. Buttermilk. Canned milk. Fats and oils Salted butter. Regular margarine. Ghee. Bacon fat. Seasonings and condiments Onion salt, garlic salt, seasoned salt, table salt, and sea salt. Canned and packaged gravies. Worcestershire sauce. Tartar sauce. Barbecue sauce. Teriyaki sauce. Soy sauce, including reduced-sodium. Steak sauce. Fish sauce. Oyster sauce. Cocktail sauce. Horseradish that you find on the shelf. Regular ketchup and mustard. Meat flavorings and tenderizers. Bouillon cubes. Hot sauce. Pre-made or packaged marinades. Pre-made or packaged taco seasonings. Relishes. Regular salad dressings. Salsa. Other  foods Salted popcorn and pretzels. Corn chips and puffs. Potato and tortilla chips. Canned or dried soups. Pizza. Frozen entrees and pot pies. The items listed above may not be a complete list of foods and beverages you  should avoid. Contact a dietitian for more information. Summary Eating less sodium can help lower your blood pressure, reduce swelling, and protect your heart, liver, and kidneys. Most people on this plan should limit their sodium intake to 1,500-2,000 mg (milligrams) of sodium each day. Canned, boxed, and frozen foods are high in sodium. Restaurant foods, fast foods, and pizza are also very high in sodium. You also get sodium by adding salt to food. Try to cook at home, eat more fresh fruits and vegetables, and eat less fast food and canned, processed, or prepared foods. This information is not intended to replace advice given to you by your health care provider. Make sure you discuss any questions you have with your health care provider. Document Revised: 12/20/2019 Document Reviewed: 10/16/2019 Elsevier Patient Education  [employer].

## 2022-12-16 NOTE — Assessment & Plan Note (Signed)
BP somewhat elevated. He is currently taking Amlodipine 10 mg once daily. I will give him a low salt diet. I have asked him to monitor his BP and send readings in 2 weeks.

## 2022-12-16 NOTE — Assessment & Plan Note (Signed)
Mild non-obstructive CAD on cath in 2015. He is doing well without angina. He remains on ASA, statin Rx. F/u in 1 year.

## 2023-01-02 DIAGNOSIS — L814 Other melanin hyperpigmentation: Secondary | ICD-10-CM | POA: Diagnosis not present

## 2023-01-02 DIAGNOSIS — L918 Other hypertrophic disorders of the skin: Secondary | ICD-10-CM | POA: Diagnosis not present

## 2023-01-02 DIAGNOSIS — L820 Inflamed seborrheic keratosis: Secondary | ICD-10-CM | POA: Diagnosis not present

## 2023-01-02 DIAGNOSIS — L570 Actinic keratosis: Secondary | ICD-10-CM | POA: Diagnosis not present

## 2023-01-02 DIAGNOSIS — L821 Other seborrheic keratosis: Secondary | ICD-10-CM | POA: Diagnosis not present

## 2023-01-04 ENCOUNTER — Encounter: Payer: Self-pay | Admitting: Family Medicine

## 2023-01-04 ENCOUNTER — Ambulatory Visit: Payer: BC Managed Care – PPO | Admitting: Family Medicine

## 2023-01-04 VITALS — BP 124/80 | HR 77 | Temp 97.9°F | Resp 17 | Ht 70.5 in | Wt 198.5 lb

## 2023-01-04 DIAGNOSIS — R197 Diarrhea, unspecified: Secondary | ICD-10-CM

## 2023-01-04 NOTE — Patient Instructions (Signed)
Follow up as needed or as scheduled Drink LOTS of fluids ADD a daily Probiotic to restore the normal gut bacteria Call with any questions or concerns Hang in there!!!

## 2023-01-04 NOTE — Progress Notes (Signed)
   Subjective:    Patient ID: Dan Sanchez, male    DOB: 1956/01/16, 67 y.o.   MRN: [id number]  HPI Diarrhea- pt had lunch on Saturday and about an hour later developed diarrhea.  Sxs lasted from Saturday until Monday morning.  Last diarrheal stool was dark green.  Reports stools are now back to normal but stomach continues to feel off.  Pt reports centralized abdominal discomfort- worse w/ coughing.  Pt reports he is able to eat regularly but appetite is decreased.  No longer feeling nauseous.  No fever.   Review of Systems For ROS see HPI     Objective:   Physical Exam Vitals reviewed.  Constitutional:      General: He is not in acute distress.    Appearance: He is well-developed. He is not ill-appearing.  HENT:     Head: Normocephalic and atraumatic.  Abdominal:     General: Bowel sounds are normal. There is no distension.     Palpations: Abdomen is soft. There is no mass.     Tenderness: There is no abdominal tenderness.  Skin:    General: Skin is warm and dry.  Neurological:     General: No focal deficit present.     Mental Status: He is alert and oriented to person, place, and time.  Psychiatric:        Mood and Affect: Mood normal. Mood is not anxious.        Behavior: Behavior normal.           Assessment & Plan:  Diarrhea- new.  Pt reports sxs started suddenly on Saturday and resolved on Monday.  He says stools are back to normal.  Stomach is still uncomfortable but he reports this feels more muscular than internal (it hurts when he coughs).  Pt is able to eat normally.  Encouraged fluids and probiotics.  Pt expressed understanding and is in agreement w/ plan.

## 2023-01-05 ENCOUNTER — Encounter: Payer: Self-pay | Admitting: Neurology

## 2023-01-05 ENCOUNTER — Ambulatory Visit: Payer: BC Managed Care – PPO | Admitting: Neurology

## 2023-01-05 VITALS — BP 136/83 | HR 72 | Ht 70.0 in | Wt 200.4 lb

## 2023-01-05 DIAGNOSIS — R0683 Snoring: Secondary | ICD-10-CM | POA: Diagnosis not present

## 2023-01-05 DIAGNOSIS — Z9189 Other specified personal risk factors, not elsewhere classified: Secondary | ICD-10-CM

## 2023-01-05 DIAGNOSIS — R351 Nocturia: Secondary | ICD-10-CM | POA: Diagnosis not present

## 2023-01-05 DIAGNOSIS — E663 Overweight: Secondary | ICD-10-CM

## 2023-01-05 NOTE — Progress Notes (Signed)
Subjective:    Patient ID: Dan Sanchez is a 67 y.o. male.  HPI    Star Age, MD, PhD [facility] [address] [name], [place] [address]  Dear Dr. Birdie Riddle,  I saw your patient, Dan Sanchez, upon your kind request in my sleep clinic today for initial consultation of his sleep disorder, in particular, concern for underlying obstructive sleep apnea.  The patient is unaccompanied today.  As you know, Dan Sanchez is a 67 year old male with an underlying medical history of allergies, reflux disease, coronary artery disease, hyperlipidemia, status post rotator cuff repair on the right, and overweight state, who reports snoring, which can be loud and disturbing to his wife. He does not wake up from his own snoring and denies pauses in his breathing or waking up with gasping. His Epworth sleepiness score is 0 out of 24, fatigue severity score is 9 out of 63..  I reviewed your office note from 01/04/2023, at which time he presented with a bout of diarrhea.  I also reviewed your office visit note from 11/09/2022. He reports sleeping on his sides and having issues breathing through is left nostril, has a remote Hx of nasal Fx, no surgery. He would consider a sleep study, but reports that he would not consider using a CPAP.  He had ENT consultation some 5 years ago for nasal congestion, surgery was not recommended.  He uses Allegra, and Flonase, has been using Afrin but stopped about a week ago.  He lives with his wife, they have 1 dog in the household, the dog typically sleeps on the bed with him.  He does not recently sleep in the same bedroom with his wife because of his loud snoring.  He is scheduled to see his dentist and would be interested in an oral appliance, would like to talk to him first before pursuing a sleep study.  He is not aware of any family history of sleep apnea.  Bedtime is generally between 9:30 PM and 10 PM and rise time between 6 and 6:30 AM.   He has nocturia about once or twice per average night, denies recurrent nocturnal or morning headaches.  He drinks caffeine in the form of coffee, about 2 cups in the morning and 1 or 2 sodas per day, last soda around dinnertime, about 6:30 PM.  He drinks alcohol on the weekends.  He is a non-smoker.  He does have a TV in his bedroom and turns it off before falling asleep.  His Past Medical History Is Significant For: Past Medical History:  Diagnosis Date   Allergy    CAD (coronary artery disease)    mild non-obstructive   GERD (gastroesophageal reflux disease)    Hyperlipidemia    S/P right rotator cuff repair    Sinusitis, bacterial    Urinary incontinence     His Past Surgical History Is Significant For: Past Surgical History:  Procedure Laterality Date   COLONOSCOPY  04/08/2009   LEFT HEART CATHETERIZATION WITH CORONARY ANGIOGRAM N/A 08/13/2014   Procedure: LEFT HEART CATHETERIZATION WITH CORONARY ANGIOGRAM;  Surgeon: Burnell Blanks, MD;  Location: [facility] CATH LAB;  Service: Cardiovascular;  Laterality: N/A;   SHOULDER ARTHROSCOPY WITH ROTATOR CUFF REPAIR Right 10/26/2017   Procedure: RIGHT SHOULDER ARTHROSCOPY WITH ROTATOR CUFF REPAIR AND DEBRIDEMENT;  Surgeon: Sydnee Cabal, MD;  Location: [facility];  Service: Orthopedics;  Laterality: Right;   SHOULDER SURGERY     Right shoulder  His Family History Is Significant For: Family History  Problem Relation Age of Onset   Heart disease Father        MI age 34   Colon cancer Neg Hx    Esophageal cancer Neg Hx    Rectal cancer Neg Hx    Stomach cancer Neg Hx    Colon polyps Neg Hx    Sleep apnea Neg Hx     His Social History Is Significant For: Social History   Socioeconomic History   Marital status: Married    Spouse name: Not on file   Number of children: Not on file   Years of education: Not on file   Highest education level: Not on file  Occupational History   Occupation: Microbiologist: [name]  Tobacco Use   Smoking status: Never   Smokeless tobacco: Never  Vaping Use   Vaping Use: Never used  Substance and Sexual Activity   Alcohol use: Yes    Alcohol/week: 4.0 standard drinks of alcohol    Types: 4 Standard drinks or equivalent per week    Comment: occ   Drug use: No   Sexual activity: Not on file  Other Topics Concern   Not on file  Social History Narrative   Not on file   Social Determinants of Health   Financial Resource Strain: Not on file  Food Insecurity: Not on file  Transportation Needs: Not on file  Physical Activity: Not on file  Stress: Not on file  Social Connections: Not on file    His Allergies Are:  Allergies  Allergen Reactions   Bee Venom Swelling  :   His Current Medications Are:  Outpatient Encounter Medications as of 01/05/2023  Medication Sig   amLODipine (NORVASC) 10 MG tablet Take 1 tablet (10 mg total) by mouth daily.   aspirin EC 81 MG tablet Take 1 tablet (81 mg total) by mouth daily. Swallow whole.   fexofenadine (ALLEGRA) 180 MG tablet Allegra Allergy   fluticasone (FLONASE) 50 MCG/ACT nasal spray Place 2 sprays into both nostrils daily.   Omega-3 Fatty Acids (FISH OIL) 1200 MG CAPS Take 1,200 mg by mouth 2 (two) times daily.   omeprazole (PRILOSEC) 20 MG capsule Take 1 capsule (20 mg total) by mouth daily.   rosuvastatin (CRESTOR) 40 MG tablet Take 1 tablet (40 mg total) by mouth daily.   No facility-administered encounter medications on file as of 01/05/2023.  :   Review of Systems:  Out of a complete 14 point review of systems, all are reviewed and negative with the exception of these symptoms as listed below:  Review of Systems  Neurological:        Pt here for sleep consult Pt snores Pt denies fatigue,hypertension,headaches,sleep study,CPAP machine     ESS:0 FSS:9    Objective:  Neurological Exam  Physical Exam Physical Examination:   Vitals:   01/05/23 1013  BP: 136/83   Pulse: 72    General Examination: The patient is a very pleasant 67 y.o. male in no acute distress. He appears well-developed and well-nourished and well groomed.   HEENT: Normocephalic, atraumatic, pupils are equal, round and reactive to light, extraocular tracking is good without limitation to gaze excursion or nystagmus noted. Hearing is grossly intact. Face is symmetric with normal facial animation. Speech is clear with no dysarthria noted. There is no hypophonia. There is no lip, neck/head, jaw or voice tremor. Neck is supple with full range of  passive and active motion. There are no carotid bruits on auscultation. Oropharynx exam reveals: mild mouth dryness, adequate dental hygiene and moderate airway crowding, due to redundant soft palate, Mallampati class III, tonsils and uvula not fully visualized.  Neck circumference 18 inches, minimal overbite noted.  Tongue protrudes centrally and palate elevates symmetrically.  Chest: Clear to auscultation without wheezing, rhonchi or crackles noted.  Heart: S1+S2+0, regular and normal without murmurs, rubs or gallops noted.   Abdomen: Soft, non-tender and non-distended.  Extremities: There is no obvious edema in the distal lower extremities bilaterally.   Skin: Warm and dry without trophic changes noted.   Musculoskeletal: exam reveals no obvious joint deformities.   Neurologically:  Mental status: The patient is awake, alert and oriented in all 4 spheres. His immediate and remote memory, attention, language skills and fund of knowledge are appropriate. There is no evidence of aphasia, agnosia, apraxia or anomia. Speech is clear with normal prosody and enunciation. Thought process is linear. Mood is normal and affect is normal.  Cranial nerves II - XII are as described above under HEENT exam.  Motor exam: Normal bulk, global movements unremarkable, no obvious action or resting tremor.  Fine motor skills and coordination: grossly intact.   Cerebellar testing: No dysmetria or intention tremor. There is no truncal or gait ataxia.  Sensory exam: intact to light touch in the upper and lower extremities.  Gait, station and balance: He stands easily. No veering to one side is noted. No leaning to one side is noted. Posture is age-appropriate and stance is narrow based. Gait shows normal stride length and normal pace. No problems turning are noted.   Assessment and Plan:  In summary, JOSUHA FONTANEZ is a very pleasant 67 y.o.-year old male with an underlying medical history of allergies, reflux disease, coronary artery disease, hyperlipidemia, status post rotator cuff repair on the right, and overweight state, whose history and physical exam are concerning for sleep disordered breathing, particularly obstructive sleep apnea (OSA). A laboratory attended sleep study is typically considered "gold standard" for evaluation of sleep disordered breathing.   I had a long chat with the patient about my findings and the diagnosis of sleep apnea, particularly OSA, its prognosis and treatment options.  He would not consider CPAP therapy.  He feels categorically against it.  He would be open to trying an oral appliance but would like to hold off on sleep testing at this time until he speaks with his dentist next week.  He is advised to call our office if he changes his mind about sleep testing.  We talked about different alternatives for sleep apnea treatment, he is advised that generally speaking PAP therapy is considered first-line treatment.  For milder cases of sleep apnea and for simple snoring and oral appliance can certainly be considered. For now, we will play by ear, he will be in touch once he has spoken with his dentist and if he would like to pursue sleep testing through our office, I would be happy to order sleep study.  I answered all his questions today and he was in agreement. Thank you very much for allowing me to participate in the care of this  nice patient. If I can be of any further assistance to you please do not hesitate to call me at [phone].  Sincerely,   Star Age, MD, PhD

## 2023-01-05 NOTE — Patient Instructions (Addendum)
Thank you for choosing [facility] for your sleep related care! It was nice to meet you today!   Here is what we discussed today:    Based on your symptoms and your exam I believe you are at risk for obstructive sleep apnea (aka OSA). We should proceed with a sleep study to determine whether you do or do not have OSA and how severe it is. Even, if you have mild OSA, I may want you to consider treatment with CPAP, as treatment of even borderline or mild sleep apnea can result and improvement of symptoms such as sleep disruption, daytime sleepiness, nighttime bathroom breaks, restless leg symptoms, improvement of headache syndromes, even improved mood disorder.   As explained, an attended sleep study (meaning you get to stay overnight in the sleep lab), lets [place] monitor sleep-related behaviors such as sleep talking and leg movements in sleep, in addition to monitoring for sleep apnea.  A home sleep test is a screening tool for sleep apnea diagnosis only, but unfortunately, does not help with any other sleep-related diagnoses.  Please remember, the long-term risks and ramifications of untreated moderate to severe obstructive sleep apnea may include (but are not limited to): increased risk for cardiovascular disease, including congestive heart failure, stroke, difficult to control hypertension, treatment resistant obesity, arrhythmias, especially irregular heartbeat commonly known as A. Fib. (atrial fibrillation); even type 2 diabetes has been linked to untreated OSA.   Other correlations that untreated obstructive sleep apnea include macular edema which is swelling of the retina in the eyes, droopy eyelid syndrome, and elevated hemoglobin and hematocrit levels (often referred to as polycythemia).  Sleep apnea can cause disruption of sleep and sleep deprivation in most cases, which, in turn, can cause recurrent headaches, problems with memory, mood, concentration, focus, and vigilance. Most  people with untreated sleep apnea report excessive daytime sleepiness, which can affect their ability to drive. Please do not drive or use heavy equipment or machinery, if you feel sleepy! Patients with sleep apnea can also develop difficulty initiating and maintaining sleep (aka insomnia).   Having sleep apnea may increase your risk for other sleep disorders, including involuntary behaviors sleep such as sleep terrors, sleep talking, sleepwalking.    Having sleep apnea can also increase your risk for restless leg syndrome and leg movements at night.   Please note that untreated obstructive sleep apnea may carry additional perioperative morbidity. Patients with significant obstructive sleep apnea (typically, in the moderate to severe degree) should receive, if possible, perioperative PAP (positive airway pressure) therapy and the surgeons and particularly the anesthesiologists should be informed of the diagnosis and the severity of the sleep disordered breathing.   As you would like to hold off on sleep testing, you can call us to schedule a sleep study if you change your mind, especially after talking to your dentist next week.

## 2023-01-20 ENCOUNTER — Other Ambulatory Visit: Payer: Self-pay

## 2023-02-27 DIAGNOSIS — M7541 Impingement syndrome of right shoulder: Secondary | ICD-10-CM | POA: Insufficient documentation

## 2023-03-01 DIAGNOSIS — M170 Bilateral primary osteoarthritis of knee: Secondary | ICD-10-CM | POA: Diagnosis not present

## 2023-03-01 DIAGNOSIS — M179 Osteoarthritis of knee, unspecified: Secondary | ICD-10-CM | POA: Insufficient documentation

## 2023-03-01 DIAGNOSIS — M7541 Impingement syndrome of right shoulder: Secondary | ICD-10-CM | POA: Diagnosis not present

## 2023-03-13 ENCOUNTER — Encounter: Payer: Self-pay | Admitting: *Deleted

## 2023-03-21 ENCOUNTER — Telehealth: Payer: Self-pay | Admitting: Family Medicine

## 2023-03-21 NOTE — Telephone Encounter (Signed)
Pt has been scheduled for a same day appt

## 2023-03-21 NOTE — Telephone Encounter (Addendum)
Caller name: JAHMEEK SHIRK  On DPR?: Yes  Call back number: [phone] (home)  Provider they see: Sheliah Hatch, MD  Reason for call: Pt got bit by tick last Saturday. Not sure if he got the head out. Surrounding area red and itchy -advise

## 2023-03-21 NOTE — Telephone Encounter (Signed)
Left pt a VM to call office to schedule an apt

## 2023-03-22 ENCOUNTER — Ambulatory Visit: Payer: BC Managed Care – PPO | Admitting: Family Medicine

## 2023-03-22 ENCOUNTER — Other Ambulatory Visit (HOSPITAL_BASED_OUTPATIENT_CLINIC_OR_DEPARTMENT_OTHER): Payer: Self-pay

## 2023-03-22 VITALS — BP 134/74 | HR 75 | Temp 98.4°F | Ht 70.0 in | Wt 199.5 lb

## 2023-03-22 DIAGNOSIS — S30861A Insect bite (nonvenomous) of abdominal wall, initial encounter: Secondary | ICD-10-CM

## 2023-03-22 DIAGNOSIS — W57XXXA Bitten or stung by nonvenomous insect and other nonvenomous arthropods, initial encounter: Secondary | ICD-10-CM | POA: Diagnosis not present

## 2023-03-22 MED ORDER — DOXYCYCLINE HYCLATE 100 MG PO TABS
100.0000 mg | ORAL_TABLET | Freq: Two times a day (BID) | ORAL | 0 refills | Status: AC
  Filled 2023-03-22: qty 28, 14d supply, fill #0

## 2023-03-22 NOTE — Patient Instructions (Addendum)
-  START Doxycycline  tablet, 1 tablet every 12 hours for 14 days.  -Keep the area clean with antibacterial soap twice a day. Do not apply any ointments or cream to the area. You can place a band aid to cover it. -If the areas becomes more red, inflamed, drainage, red strikes coming from the site and going outward in a starbrust effect, or fever. Needs to follow up.

## 2023-03-22 NOTE — Progress Notes (Signed)
   Acute Office Visit   Subjective:  Patient ID: Dan Sanchez, male    DOB: 05-Aug-1956, 67 y.o.   MRN: [id number]  Chief Complaint  Patient presents with   Tick Removal    Pt is here for tick bite on lower abdominal area. Pt reports a big "bump" Pt has tick with him. Denies any fever, fatigued.    HPI Patient is a 67 year old caucasian male that present with a tick and tick bite on the mid lower abd. He reports this occurred on Saturday.   No fever, rash, pain at the sight, or fatigue.  Denies no other symptoms.   He reports he has been cleaning the area and applying Neosporin. He reports how it is now, is how it has been since he pulled the tick off. No change in area.  ROS See HPI above      Objective:   BP 134/74   Pulse 75   Temp 98.4 F (36.9 C)   Ht  (1.778 m)   Wt 199 lb 8 oz (90.5 kg)   SpO2 98%   BMI 28.63 kg/m    Physical Exam Vitals reviewed.  Constitutional:      General: He is not in acute distress.    Appearance: Normal appearance. He is not ill-appearing, toxic-appearing or diaphoretic.  Eyes:     General:        Right eye: No discharge.        Left eye: No discharge.     Conjunctiva/sclera: Conjunctivae normal.  Cardiovascular:     Rate and Rhythm: Normal rate.  Pulmonary:     Effort: Pulmonary effort is normal. No respiratory distress.  Musculoskeletal:        General: Normal range of motion.     Comments: One single raised erythema area on mid lower abd. Dark scab in the center with some redness around. No drainage or scaly skin.   Skin:    General: Skin is warm and dry.  Neurological:     General: No focal deficit present.     Mental Status: He is alert and oriented to person, place, and time. Mental status is at baseline.  Psychiatric:        Mood and Affect: Mood normal.        Behavior: Behavior normal.        Thought Content: Thought content normal.        Judgment: Judgment normal.        Assessment & Plan:  Tick bite,  unspecified site, initial encounter -     Doxycycline Hyclate; Take 1 tablet (100 mg total) by mouth 2 (two) times daily for 14 days.  Dispense: 28 tablet; Refill: 0  1.Prescribed Doxycycline  tablet, 1 tablet every 12 hours for 14 days.  2.Recommend to keep the area clean with antibacterial soap twice a day. Do not apply any ointments or cream to the area. He can place a band aid to cover it. 3.Advised if the areas becomes more red, inflamed, drainage, red strikes coming from the site and going outward in a starbrust effect, or fever. Needs to follow up.  4.He expressed understanding and is in agreement with plan. Reviewed red flags from the list printed material on tick bites that should prompt return.  No follow-ups on file.  Zandra Abts, NP

## 2023-04-18 ENCOUNTER — Other Ambulatory Visit (HOSPITAL_BASED_OUTPATIENT_CLINIC_OR_DEPARTMENT_OTHER): Payer: Self-pay

## 2023-04-18 ENCOUNTER — Other Ambulatory Visit: Payer: Self-pay | Admitting: Family Medicine

## 2023-04-18 MED ORDER — OMEPRAZOLE 20 MG PO CPDR
20.0000 mg | DELAYED_RELEASE_CAPSULE | Freq: Every day | ORAL | 1 refills | Status: DC
  Filled 2023-04-18: qty 90, 90d supply, fill #0
  Filled 2023-07-18: qty 90, 90d supply, fill #1

## 2023-05-05 DIAGNOSIS — M1711 Unilateral primary osteoarthritis, right knee: Secondary | ICD-10-CM | POA: Diagnosis not present

## 2023-05-10 DIAGNOSIS — M1711 Unilateral primary osteoarthritis, right knee: Secondary | ICD-10-CM | POA: Diagnosis not present

## 2023-05-15 ENCOUNTER — Ambulatory Visit: Payer: BC Managed Care – PPO | Admitting: Family Medicine

## 2023-05-16 ENCOUNTER — Encounter: Payer: Self-pay | Admitting: Family Medicine

## 2023-05-17 DIAGNOSIS — M1711 Unilateral primary osteoarthritis, right knee: Secondary | ICD-10-CM | POA: Diagnosis not present

## 2023-05-22 ENCOUNTER — Encounter: Payer: Self-pay | Admitting: Family Medicine

## 2023-05-22 ENCOUNTER — Ambulatory Visit: Payer: BC Managed Care – PPO | Admitting: Family Medicine

## 2023-05-22 ENCOUNTER — Telehealth: Payer: Self-pay

## 2023-05-22 VITALS — BP 126/80 | HR 71 | Temp 98.1°F | Resp 18 | Ht 70.0 in | Wt 197.4 lb

## 2023-05-22 DIAGNOSIS — M6259 Muscle wasting and atrophy, not elsewhere classified, multiple sites: Secondary | ICD-10-CM

## 2023-05-22 DIAGNOSIS — E7800 Pure hypercholesterolemia, unspecified: Secondary | ICD-10-CM

## 2023-05-22 DIAGNOSIS — E663 Overweight: Secondary | ICD-10-CM | POA: Diagnosis not present

## 2023-05-22 DIAGNOSIS — I10 Essential (primary) hypertension: Secondary | ICD-10-CM | POA: Diagnosis not present

## 2023-05-22 LAB — CBC WITH DIFFERENTIAL/PLATELET
Basophils Absolute: 0.1 10*3/uL (ref 0.0–0.1)
Basophils Relative: 1.3 % (ref 0.0–3.0)
Eosinophils Absolute: 0.1 10*3/uL (ref 0.0–0.7)
Eosinophils Relative: 2.1 % (ref 0.0–5.0)
HCT: 43.9 % (ref 39.0–52.0)
Hemoglobin: 14.9 g/dL (ref 13.0–17.0)
Lymphocytes Relative: 33.3 % (ref 12.0–46.0)
Lymphs Abs: 1.6 10*3/uL (ref 0.7–4.0)
MCHC: 33.9 g/dL (ref 30.0–36.0)
MCV: 87.8 fl (ref 78.0–100.0)
Monocytes Absolute: 0.4 10*3/uL (ref 0.1–1.0)
Monocytes Relative: 8.9 % (ref 3.0–12.0)
Neutro Abs: 2.7 10*3/uL (ref 1.4–7.7)
Neutrophils Relative %: 54.4 % (ref 43.0–77.0)
Platelets: 267 10*3/uL (ref 150.0–400.0)
RBC: 5 Mil/uL (ref 4.22–5.81)
RDW: 13.6 % (ref 11.5–15.5)
WBC: 4.9 10*3/uL (ref 4.0–10.5)

## 2023-05-22 LAB — BASIC METABOLIC PANEL
BUN: 14 mg/dL (ref 6–23)
CO2: 28 mEq/L (ref 19–32)
Calcium: 9.6 mg/dL (ref 8.4–10.5)
Chloride: 103 mEq/L (ref 96–112)
Creatinine, Ser: 1.04 mg/dL (ref 0.40–1.50)
GFR: 75.71 mL/min (ref >60.00)
Glucose, Bld: 103 mg/dL — ABNORMAL HIGH (ref 70–99)
Potassium: 4.2 mEq/L (ref 3.5–5.1)
Sodium: 139 mEq/L (ref 135–145)

## 2023-05-22 LAB — HEPATIC FUNCTION PANEL
ALT: 46 U/L (ref 0–53)
AST: 31 U/L (ref 0–37)
Albumin: 4.8 g/dL (ref 3.5–5.2)
Alkaline Phosphatase: 60 U/L (ref 39–117)
Bilirubin, Direct: 0.2 mg/dL (ref 0.0–0.3)
Total Bilirubin: 0.7 mg/dL (ref 0.2–1.2)
Total Protein: 7.6 g/dL (ref 6.0–8.3)

## 2023-05-22 LAB — LIPID PANEL
Cholesterol: 158 mg/dL (ref 0–200)
HDL: 65 mg/dL (ref >39.00)
LDL Cholesterol: 67 mg/dL (ref 0–99)
NonHDL: 92.94
Total CHOL/HDL Ratio: 2
Triglycerides: 128 mg/dL (ref 0.0–149.0)
VLDL: 25.6 mg/dL (ref 0.0–40.0)

## 2023-05-22 LAB — TSH: TSH: 1.66 u[IU]/mL (ref 0.35–5.50)

## 2023-05-22 NOTE — Telephone Encounter (Signed)
-----   Message from Sheliah Hatch, MD sent at 05/22/2023  1:16 PM EDT ----- Labs look great!  Waiting on Testosterone level

## 2023-05-22 NOTE — Progress Notes (Signed)
   Subjective:    Patient ID: Dan Sanchez, male    DOB: [birth year], 67 y.o.   MRN: [id number]  HPI HTN- chronic problem, on Amlodipine 10mg  daily w/ good control.  Denies CP, SOB, HA's, visual changes, edema.  Hyperlipidemia- chronic problem, on Crestor 40mg  daily.  No abd pain, N/V.  Overweight- pt is down 3 lbs since April.  BMI now 28.32  Pt plays golf regularly  and does yard work.  ? Low T- pt has hx of ED.  Now notes decreased muscle mass, some fatigue.   Review of Systems For ROS see HPI     Objective:   Physical Exam Vitals reviewed.  Constitutional:      General: He is not in acute distress.    Appearance: Normal appearance. He is well-developed. He is not ill-appearing.  HENT:     Head: Normocephalic and atraumatic.  Eyes:     Extraocular Movements: Extraocular movements intact.     Conjunctiva/sclera: Conjunctivae normal.     Pupils: Pupils are equal, round, and reactive to light.  Neck:     Thyroid: No thyromegaly.  Cardiovascular:     Rate and Rhythm: Normal rate and regular rhythm.     Pulses: Normal pulses.     Heart sounds: Normal heart sounds. No murmur heard. Pulmonary:     Effort: Pulmonary effort is normal. No respiratory distress.     Breath sounds: Normal breath sounds.  Abdominal:     General: Bowel sounds are normal. There is no distension.     Palpations: Abdomen is soft.  Musculoskeletal:     Cervical back: Normal range of motion and neck supple.     Right lower leg: No edema.     Left lower leg: No edema.  Lymphadenopathy:     Cervical: No cervical adenopathy.  Skin:    General: Skin is warm and dry.  Neurological:     General: No focal deficit present.     Mental Status: He is alert and oriented to person, place, and time.     Cranial Nerves: No cranial nerve deficit.  Psychiatric:        Mood and Affect: Mood normal.        Behavior: Behavior normal.           Assessment & Plan:   Decreased muscle mass/atrophy- new.  Pt  has hx of ED.  Will check testosterone and tx if needed

## 2023-05-22 NOTE — Patient Instructions (Signed)
Schedule your complete physical in 6 months We'll notify you of your lab results and make any changes if needed Continue to work on healthy diet and regular exercise- you can do it! Consider [facility], [facility], Dr Ronney Asters for your eye exam Call with any questions or concerns Stay Safe!  Stay Healthy! Have a great summer!!

## 2023-05-22 NOTE — Telephone Encounter (Signed)
Pt aware of lab results 

## 2023-05-22 NOTE — Assessment & Plan Note (Signed)
Pt is down 3 lbs since last visit.  No formal exercise but walks while playing golf and does a lot of yard work.  Applauded his efforts and encouraged him to continue.  Will continue to follow.

## 2023-05-22 NOTE — Assessment & Plan Note (Signed)
Chronic problem.  On Crestor 40mg daily w/o difficulty.  Check labs.  Adjust meds prn  ?

## 2023-05-22 NOTE — Assessment & Plan Note (Signed)
Chronic problem.  On Amlodipine 10mg  daily w/o difficulty.  No med changes at this time.

## 2023-05-25 ENCOUNTER — Other Ambulatory Visit: Payer: Self-pay

## 2023-05-25 DIAGNOSIS — M6259 Muscle wasting and atrophy, not elsewhere classified, multiple sites: Secondary | ICD-10-CM

## 2023-05-25 DIAGNOSIS — E7800 Pure hypercholesterolemia, unspecified: Secondary | ICD-10-CM

## 2023-05-25 NOTE — Telephone Encounter (Signed)
His testosterone was ordered but it was not collected.  At this time, it will need to be re-ordered as future and he will need a lab only visit (can be at the location of his choice since this was a lab error)  Please apologize to him for the mistake as this apparently was overlooked

## 2023-05-26 ENCOUNTER — Telehealth: Payer: Self-pay

## 2023-05-26 ENCOUNTER — Other Ambulatory Visit (INDEPENDENT_AMBULATORY_CARE_PROVIDER_SITE_OTHER): Payer: BC Managed Care – PPO

## 2023-05-26 DIAGNOSIS — M6259 Muscle wasting and atrophy, not elsewhere classified, multiple sites: Secondary | ICD-10-CM

## 2023-05-26 DIAGNOSIS — E7800 Pure hypercholesterolemia, unspecified: Secondary | ICD-10-CM | POA: Diagnosis not present

## 2023-05-26 LAB — TESTOSTERONE: Testosterone: 322.89 ng/dL (ref 300.00–890.00)

## 2023-05-26 NOTE — Telephone Encounter (Signed)
-----   Message from Sheliah Hatch, MD sent at 05/26/2023  1:33 PM EDT ----- Testosterone level is normal.  This is great news!

## 2023-05-26 NOTE — Telephone Encounter (Signed)
Pt aware of lab results 

## 2023-05-28 NOTE — Telephone Encounter (Signed)
I sent an email to you all with time stamps of orders so we can look at it.

## 2023-05-29 DIAGNOSIS — M25511 Pain in right shoulder: Secondary | ICD-10-CM | POA: Diagnosis not present

## 2023-07-18 ENCOUNTER — Other Ambulatory Visit (HOSPITAL_BASED_OUTPATIENT_CLINIC_OR_DEPARTMENT_OTHER): Payer: Self-pay

## 2023-08-08 DIAGNOSIS — M9902 Segmental and somatic dysfunction of thoracic region: Secondary | ICD-10-CM | POA: Diagnosis not present

## 2023-08-08 DIAGNOSIS — M9903 Segmental and somatic dysfunction of lumbar region: Secondary | ICD-10-CM | POA: Diagnosis not present

## 2023-08-08 DIAGNOSIS — M5442 Lumbago with sciatica, left side: Secondary | ICD-10-CM | POA: Diagnosis not present

## 2023-08-08 DIAGNOSIS — M9905 Segmental and somatic dysfunction of pelvic region: Secondary | ICD-10-CM | POA: Diagnosis not present

## 2023-08-09 ENCOUNTER — Other Ambulatory Visit (HOSPITAL_BASED_OUTPATIENT_CLINIC_OR_DEPARTMENT_OTHER): Payer: Self-pay

## 2023-08-09 ENCOUNTER — Telehealth: Payer: Self-pay

## 2023-08-09 ENCOUNTER — Other Ambulatory Visit: Payer: Self-pay

## 2023-08-09 ENCOUNTER — Encounter: Payer: Self-pay | Admitting: Family Medicine

## 2023-08-09 ENCOUNTER — Ambulatory Visit (INDEPENDENT_AMBULATORY_CARE_PROVIDER_SITE_OTHER): Payer: Medicare Other | Admitting: Family Medicine

## 2023-08-09 VITALS — BP 134/82 | HR 85 | Temp 98.0°F | Resp 17 | Ht 70.0 in | Wt 196.1 lb

## 2023-08-09 DIAGNOSIS — M5416 Radiculopathy, lumbar region: Secondary | ICD-10-CM

## 2023-08-09 DIAGNOSIS — R61 Generalized hyperhidrosis: Secondary | ICD-10-CM

## 2023-08-09 DIAGNOSIS — R7309 Other abnormal glucose: Secondary | ICD-10-CM

## 2023-08-09 DIAGNOSIS — R748 Abnormal levels of other serum enzymes: Secondary | ICD-10-CM

## 2023-08-09 LAB — CBC WITH DIFFERENTIAL/PLATELET
Basophils Absolute: 0.1 10*3/uL (ref 0.0–0.1)
Basophils Relative: 1 % (ref 0.0–3.0)
Eosinophils Absolute: 0 10*3/uL (ref 0.0–0.7)
Eosinophils Relative: 0.2 % (ref 0.0–5.0)
HCT: 38.2 % — ABNORMAL LOW (ref 39.0–52.0)
Hemoglobin: 12.6 g/dL — ABNORMAL LOW (ref 13.0–17.0)
Lymphocytes Relative: 11.8 % — ABNORMAL LOW (ref 12.0–46.0)
Lymphs Abs: 1.2 10*3/uL (ref 0.7–4.0)
MCHC: 33 g/dL (ref 30.0–36.0)
MCV: 88.3 fl (ref 78.0–100.0)
Monocytes Absolute: 0.8 10*3/uL (ref 0.1–1.0)
Monocytes Relative: 7.8 % (ref 3.0–12.0)
Neutro Abs: 7.8 10*3/uL — ABNORMAL HIGH (ref 1.4–7.7)
Neutrophils Relative %: 79.2 % — ABNORMAL HIGH (ref 43.0–77.0)
Platelets: 307 10*3/uL (ref 150.0–400.0)
RBC: 4.33 Mil/uL (ref 4.22–5.81)
RDW: 13.1 % (ref 11.5–15.5)
WBC: 9.9 10*3/uL (ref 4.0–10.5)

## 2023-08-09 LAB — BASIC METABOLIC PANEL
BUN: 15 mg/dL (ref 6–23)
CO2: 27 meq/L (ref 19–32)
Calcium: 9.2 mg/dL (ref 8.4–10.5)
Chloride: 101 meq/L (ref 96–112)
Creatinine, Ser: 0.95 mg/dL (ref 0.40–1.50)
GFR: 84.27 mL/min (ref >60.00)
Glucose, Bld: 130 mg/dL — ABNORMAL HIGH (ref 70–99)
Potassium: 3.5 meq/L (ref 3.5–5.1)
Sodium: 136 meq/L (ref 135–145)

## 2023-08-09 LAB — HEPATIC FUNCTION PANEL
ALT: 78 U/L — ABNORMAL HIGH (ref 0–53)
AST: 65 U/L — ABNORMAL HIGH (ref 0–37)
Albumin: 3.7 g/dL (ref 3.5–5.2)
Alkaline Phosphatase: 86 U/L (ref 39–117)
Bilirubin, Direct: 0.2 mg/dL (ref 0.0–0.3)
Total Bilirubin: 0.7 mg/dL (ref 0.2–1.2)
Total Protein: 7.2 g/dL (ref 6.0–8.3)

## 2023-08-09 LAB — TSH: TSH: 1.73 u[IU]/mL (ref 0.35–5.50)

## 2023-08-09 MED ORDER — PREDNISONE 10 MG PO TABS
ORAL_TABLET | ORAL | 0 refills | Status: DC
  Filled 2023-08-09: qty 18, 9d supply, fill #0

## 2023-08-09 NOTE — Telephone Encounter (Signed)
-----   Message from Neena Rhymes sent at 08/09/2023  3:36 PM EDT ----- Your sugar is elevated today.  We will add an A1C to assess for possible diabetes.  Your liver enzymes (AST/ALT) are elevated.  This is new for you.  Please hold tylenol and alcohol x2 weeks and we'll repeat your liver functions at a lab only visit. Elevated enzymes can be cause by viral illness, excessive tylenol or alcohol intake, gallstones, etc.  No reason to worry at this time, but something we want to follow and ensure it returns to normal  Remainder of labs look good

## 2023-08-09 NOTE — Progress Notes (Unsigned)
   Subjective:    Patient ID: Dan Sanchez, male    DOB: October 04, 1956, 67 y.o.   MRN: [id number]  HPI Back pain- tried to pick up riding lawn mower 6 days ago.  Friday was unable to walk due to back pain.  Wife- a PT- worked on his back on Friday and Saturday w/ some relief.  But then would stand and 'back would lock up again'.  Had dry needling done on Sunday.  Monday did some stretching.  Tuesday went to chiropractor and had him work on legs, back, neck.  Is now able to walk without it locking up but still having pain.  Pain will radiate down L side to just above the knee.  No leg weakness.  No new numbness/tingling.  No incontinence.  'i've never hurt so bad in my life'.  Night sweats- pt reports waking up 'soaking wet'.  Sxs started Saturday.  Decreased appetite.  No cough.  Has had 2 negative COVID tests.  Initially had HA and sinus congestion but this is improving.  Has cold sore on R lower lip- present for 7 days.     Review of Systems For ROS see HPI     Objective:   Physical Exam Vitals reviewed.  Constitutional:      General: Dan Sanchez is not in acute distress.    Appearance: Normal appearance. Dan Sanchez is not ill-appearing.  HENT:     Head: Normocephalic and atraumatic.     Nose: No congestion.     Mouth/Throat:     Comments: Cold sore on R lower lip Eyes:     General: No scleral icterus.    Extraocular Movements: Extraocular movements intact.     Conjunctiva/sclera: Conjunctivae normal.  Cardiovascular:     Rate and Rhythm: Normal rate and regular rhythm.     Heart sounds: Normal heart sounds.  Pulmonary:     Effort: Pulmonary effort is normal. No respiratory distress.     Breath sounds: No wheezing or rhonchi.  Abdominal:     General: There is no distension.     Palpations: Abdomen is soft.     Tenderness: There is no abdominal tenderness. There is no guarding or rebound.  Musculoskeletal:     Cervical back: Neck supple.  Lymphadenopathy:     Cervical: No cervical adenopathy.   Skin:    General: Skin is warm and dry.  Neurological:     General: No focal deficit present.     Mental Status: Dan Sanchez is alert and oriented to person, place, and time.     Comments: + SLR on L  Psychiatric:        Mood and Affect: Mood normal.        Behavior: Behavior normal.        Thought Content: Thought content normal.           Assessment & Plan:  Night sweats- new.  Sxs started Saturday after his back injury.  Denies cough.  Some fatigue and decreased appetite.  COVID test (-).  Has had cold sore present for 1 week- which Dan Sanchez typically gets in the setting of illness.  No documented fevers.  No body aches other than back pain.  Sxs may be pain mediated but will check labs to assess for possible infection or metabolic abnormality.  Pt to continue to monitor sxs and we will determine next steps based on results of labs and CXR.  Pt expressed understanding and is in agreement w/ plan.

## 2023-08-09 NOTE — Telephone Encounter (Signed)
I have pt aware of lab results . The add on A1C has been faxed to lab and the repeat liver function test has been placed . 2 week lab only visit has been made

## 2023-08-09 NOTE — Patient Instructions (Addendum)
Follow up as needed or as scheduled Go to [address], [place], [place] [address] to get your chest xray We'll notify you of your lab results and make any changes if needed START the Prednisone as directed- 3 pills at the same time x3 days, then 2 pills at the same time x3 days, then 1 pill daily.  Take w/ food  HEAT your back- heated seats, heating pad at home- to help w/ muscle spasm Call with any questions or concerns Hang in there!!!

## 2023-08-10 ENCOUNTER — Telehealth: Payer: Self-pay

## 2023-08-10 ENCOUNTER — Ambulatory Visit (INDEPENDENT_AMBULATORY_CARE_PROVIDER_SITE_OTHER): Payer: Self-pay

## 2023-08-10 DIAGNOSIS — R7309 Other abnormal glucose: Secondary | ICD-10-CM

## 2023-08-10 LAB — HEMOGLOBIN A1C: Hgb A1c MFr Bld: 5.6 % (ref 4.6–6.5)

## 2023-08-10 NOTE — Telephone Encounter (Signed)
Pt is aware of the lab results  °

## 2023-08-10 NOTE — Telephone Encounter (Signed)
-----   Message from Neena Rhymes sent at 08/10/2023 10:05 AM EDT ----- No evidence of diabetes- great news!

## 2023-08-10 NOTE — Assessment & Plan Note (Signed)
New.  Pt reports hx of similar.  Sxs occurred after trying to lift a riding lawn mower on Thursday.  Friday was unable to stand or walk due to L sided LBP.  Wife- PT- worked on his back w/ some temporary improvement.  Had dry needling w/ temporary relief.  Went to chiropractor on Tuesday.  Is now able to walk w/o severe spasm but continues to have radiating pain.  Start prednisone taper.  Reviewed supportive care and red flags that should prompt return.  Pt expressed understanding and is in agreement w/ plan.

## 2023-08-13 ENCOUNTER — Encounter: Payer: Self-pay | Admitting: Family Medicine

## 2023-08-16 ENCOUNTER — Other Ambulatory Visit (HOSPITAL_BASED_OUTPATIENT_CLINIC_OR_DEPARTMENT_OTHER): Payer: Self-pay

## 2023-08-16 DIAGNOSIS — M5451 Vertebrogenic low back pain: Secondary | ICD-10-CM | POA: Diagnosis not present

## 2023-08-16 MED ORDER — METHOCARBAMOL 500 MG PO TABS
500.0000 mg | ORAL_TABLET | Freq: Three times a day (TID) | ORAL | 0 refills | Status: DC | PRN
  Filled 2023-08-16: qty 12, 4d supply, fill #0

## 2023-08-16 MED ORDER — CELECOXIB 200 MG PO CAPS
200.0000 mg | ORAL_CAPSULE | Freq: Two times a day (BID) | ORAL | 0 refills | Status: DC
  Filled 2023-08-16: qty 60, 30d supply, fill #0

## 2023-08-21 ENCOUNTER — Encounter: Payer: Self-pay | Admitting: Family Medicine

## 2023-08-21 ENCOUNTER — Encounter: Payer: Self-pay | Admitting: Physical Therapy

## 2023-08-21 ENCOUNTER — Ambulatory Visit: Payer: Medicare Other | Attending: Orthopedic Surgery | Admitting: Physical Therapy

## 2023-08-21 ENCOUNTER — Other Ambulatory Visit (HOSPITAL_BASED_OUTPATIENT_CLINIC_OR_DEPARTMENT_OTHER): Payer: Self-pay

## 2023-08-21 DIAGNOSIS — M5459 Other low back pain: Secondary | ICD-10-CM

## 2023-08-21 DIAGNOSIS — M5416 Radiculopathy, lumbar region: Secondary | ICD-10-CM

## 2023-08-21 DIAGNOSIS — M6281 Muscle weakness (generalized): Secondary | ICD-10-CM | POA: Diagnosis not present

## 2023-08-21 MED ORDER — METHOCARBAMOL 500 MG PO TABS
500.0000 mg | ORAL_TABLET | Freq: Three times a day (TID) | ORAL | 0 refills | Status: DC
  Filled 2023-08-21: qty 42, 14d supply, fill #0

## 2023-08-21 NOTE — Therapy (Signed)
OUTPATIENT PHYSICAL THERAPY THORACOLUMBAR EVALUATION   Patient Name: Dan Sanchez MRN: [id number] DOB:[birth year], 67 y.o., male Today's Date: 08/21/2023  END OF SESSION:  PT End of Session - 08/21/23 1733     Visit Number 1    Date for PT Re-Evaluation 11/20/23    Authorization Type BC[employer]    PT Start Time 1733    PT Stop Time 1820    PT Time Calculation (min) 47 min    Activity Tolerance Patient tolerated treatment well    Behavior During Therapy [facility] for tasks assessed/performed             Past Medical History:  Diagnosis Date   Allergy    CAD (coronary artery disease)    mild non-obstructive   GERD (gastroesophageal reflux disease)    Hyperlipidemia    S/P right rotator cuff repair    Sinusitis, bacterial    Urinary incontinence    Past Surgical History:  Procedure Laterality Date   COLONOSCOPY  04/08/2009   LEFT HEART CATHETERIZATION WITH CORONARY ANGIOGRAM N/A 08/13/2014   Procedure: LEFT HEART CATHETERIZATION WITH CORONARY ANGIOGRAM;  Surgeon: Kathleene Hazel, MD;  Location: [facility] CATH LAB;  Service: Cardiovascular;  Laterality: N/A;   SHOULDER ARTHROSCOPY WITH ROTATOR CUFF REPAIR Right 10/26/2017   Procedure: RIGHT SHOULDER ARTHROSCOPY WITH ROTATOR CUFF REPAIR AND DEBRIDEMENT;  Surgeon: Eugenia Mcalpine, MD;  Location: [facility];  Service: Orthopedics;  Laterality: Right;   SHOULDER SURGERY     Right shoulder    Patient Active Problem List   Diagnosis Date Noted   Osteoarthritis of knee 03/01/2023   Impingement syndrome of right shoulder region 02/27/2023   Coronary artery disease involving native coronary artery of native heart without angina pectoris 12/15/2022   Spinal stenosis of lumbar region with neurogenic claudication 03/23/2021   Lumbar radiculopathy 02/04/2021   HTN (hypertension) 03/29/2019   Overweight (BMI 25.0-29.9) 03/29/2019   Nasal septal deviation 02/01/2019   Nasal crusting 02/01/2019   Nasal turbinate hypertrophy  02/01/2019   S/P right rotator cuff repair 10/26/2017   Contact dermatitis 08/27/2015   HSV-1 (herpes simplex virus 1) infection 08/27/2015   Bee sting allergy 09/10/2014   Chest pain 08/11/2014   Allergic rhinitis 08/10/2014   Allergic conjunctivitis 02/20/2014   Insomnia 02/03/2014   GERD (gastroesophageal reflux disease) 02/03/2014   General medical examination 03/16/2012   Hyperlipidemia 12/24/2007   ERECTILE DYSFUNCTION 12/24/2007    PCP: Beverely Low, MD  REFERRING PROVIDER: Shon Baton, MD  REFERRING DIAG: lumbar radiculopathy  Rationale for Evaluation and Treatment: Rehabilitation  THERAPY DIAG:  Other low back pain  Radiculopathy, lumbar region  Muscle weakness (generalized)  ONSET DATE: 07/30/23  SUBJECTIVE:  SUBJECTIVE STATEMENT: Patient reports he was lifting a lawn mower about 3 weeks ago, reports that night he really started having pain and numbness and burning in the LE's sometimes in the left leg and others in the right  PERTINENT HISTORY:  none  PAIN:  Are you having pain? Yes: NPRS scale: 5/10 Pain location: low back and in the right leg today Pain description: tightness in the back, numbness and burning in the LE's Aggravating factors: sitting, bending, activity at worst about 8-9/10 Relieving factors: stretching, heat, sitting in the recliner 0/10 at best  PRECAUTIONS: None  RED FLAGS: None   WEIGHT BEARING RESTRICTIONS: No  FALLS:  Has patient fallen in last 6 months? No  LIVING ENVIRONMENT: Lives with: lives with their family and lives with their spouse Lives in: House/apartment Stairs: Yes: Internal: 12 steps; bilateral but cannot reach both Has following equipment at home: None  OCCUPATION: sitting in sales, some driving  PLOF: Independent and yardwork, some  tennis/pickleball 1 every month, golf every week  PATIENT GOALS: have less pain, move better  NEXT MD VISIT: none scheduled  OBJECTIVE:   DIAGNOSTIC FINDINGS:  Older X-ray with significant stenosis  COGNITION: Overall cognitive status: Within functional limits for tasks assessed     SENSATION: WFL  MUSCLE LENGTH: Very tight HS 40 degree SLR Very tight ITB and piriformis mms  POSTURE: rounded shoulders, forward head, and decreased lumbar lordosis  PALPATION: Tight with mild tenderness   LUMBAR ROM:   AROM eval  Flexion Decreased 75% with pain  Extension Decreased 100% with pain  Right lateral flexion Decreased 75%  Left lateral flexion Decreased 75%  Right rotation   Left rotation    (Blank rows = not tested)  LOWER EXTREMITY ROM:     WNLs with some pain for knee extension  LOWER EXTREMITY MMT:    4+/5 with some pain for hips and knees  LUMBAR SPECIAL TESTS:  Straight leg raise test: Positive and Slump test: Negative  FUNCTIONAL TESTS:  Timed up and go (TUG): 16 seconds  GAIT: Distance walked: 100 feet Assistive device utilized: None Level of assistance: Complete Independence Comments: limps on the right difficulty walking due to pain in the right LE  TODAY'S TREATMENT:                                                                                                                              DATE:     PATIENT EDUCATION:  Education details: HEP/POC Person educated: Patient Education method: Programmer, multimedia, Facilities manager, Actor cues, and Verbal cues Education comprehension: verbalized understanding  HOME EXERCISE PROGRAM: Passive stretch with his wife for HS, pirformis  ASSESSMENT:  CLINICAL IMPRESSION: Patient is a 67  y.o. male who was seen today for physical therapy evaluation and treatment for lumbar radiculopathy, he lifted a lawn mower about 3 weeks ago and started having pain, he has a hx of LBP, with some radiculopathy, past MRI's show  significant stenosis.  He has had right and left leg pain and numbness and burning recently  He has responded well in the past with injections but has none scheduled.  He is very tight in the HS, very limited in his lumbar ROM with pain and stiffness, sitting relieves his pain   OBJECTIVE IMPAIRMENTS: Abnormal gait, cardiopulmonary status limiting activity, decreased activity tolerance, decreased balance, decreased endurance, decreased mobility, difficulty walking, decreased ROM, decreased strength, increased muscle spasms, impaired flexibility, improper body mechanics, postural dysfunction, and pain.   REHAB POTENTIAL: Good  CLINICAL DECISION MAKING: Stable/uncomplicated  EVALUATION COMPLEXITY: Low   GOALS: Goals reviewed with patient? Yes  SHORT TERM GOALS: Target date: 09/01/23  Independent with initial HEP Goal status: INITIAL  LONG TERM GOALS: Target date: 11/20/23  Independent with advanced HEP Goal status: INITIAL  2.  Decrease pain 50% Goal status: INITIAL  3.  Increase lumbar ROM 25% Goal status: INITIAL  4.  Increase SLR to 70 degrees Goal status: INITIAL  5.  Be able to mow the yard Goal status: INITIAL  PLAN:  PT FREQUENCY: 1-2x/week  PT DURATION: 12 weeks  PLANNED INTERVENTIONS: Therapeutic exercises, Therapeutic activity, Neuromuscular re-education, Balance training, Gait training, Patient/Family education, Self Care, Joint mobilization, Joint manipulation, Dry Needling, Electrical stimulation, Spinal manipulation, Spinal mobilization, Cryotherapy, Moist heat, Traction, Ultrasound, and Manual therapy.  PLAN FOR NEXT SESSION: start core and flexibility exercises, modalities as needed   Jearld Lesch, PT 08/21/2023, 5:34 PM

## 2023-08-22 ENCOUNTER — Encounter: Payer: Self-pay | Admitting: Sports Medicine

## 2023-08-22 ENCOUNTER — Telehealth: Payer: Self-pay | Admitting: Family Medicine

## 2023-08-22 ENCOUNTER — Ambulatory Visit: Payer: Medicare Other | Admitting: Sports Medicine

## 2023-08-22 VITALS — BP 138/80 | Ht 70.0 in | Wt 196.0 lb

## 2023-08-22 DIAGNOSIS — M5416 Radiculopathy, lumbar region: Secondary | ICD-10-CM

## 2023-08-22 DIAGNOSIS — M48061 Spinal stenosis, lumbar region without neurogenic claudication: Secondary | ICD-10-CM

## 2023-08-22 NOTE — Progress Notes (Signed)
Subjective:    Patient ID: Dan Sanchez, male    DOB: 1956/04/09, 67 y.o.   MRN: [id number]  HPI chief complaint: Low back pain  Dan Sanchez is a very pleasant 67 year old male that presents today with low back pain that began 3 weeks ago when he was picking up his lawnmower.  He has a well-documented history of spinal stenosis.  He has received multiple facet and ESI injections in the past.  Initially 3 weeks ago he had great difficulty ambulating.  His wife is a physical therapist and he has been working with her as well as another physical therapist on regaining his mobility.  He also tried chiropractic treatment which was not very beneficial.  He has intermittent pain down the right leg as well as some numbness.  Most recent episode was yesterday.  He recently finished a prednisone Dosepak but does not think it was helpful either.  The spinal injections have been extremely helpful in the past.  He is requesting an order for a repeat injection to be done ASAP.  Past medical history reviewed Medications reviewed Allergies reviewed    Review of Systems As above    Objective:   Physical Exam  Well-developed, well-nourished.  No acute distress  Lumbar spine: Fairly good lumbar mobility.  Neurological exam: Reflexes are equal at the patellar and Achilles reflexes bilaterally.  Strength is 5/5 in both lower extremities.  No atrophy.  An MRI from 2022 of the lumbar spine shows severe spinal stenosis at L4-L5 with severe subarticular stenosis bilaterally.  He also has moderate spinal stenosis at L3-L4 with moderate to severe subarticular stenosis bilaterally at this level.      Assessment & Plan:   Acute on chronic low back pain secondary to spinal stenosis  Patient has done very well with previous ESI's.  We will refer the patient to [facility] imaging for a repeat ESI.  He will continue with physical therapy as well.  Follow-up as needed.  This note was dictated using Dragon naturally  speaking software and may contain errors in syntax, spelling, or content which have not been identified prior to signing this note.

## 2023-08-22 NOTE — Telephone Encounter (Signed)
Patient notes his back is feeling better but now has radiating pain down the Rt leg with tingling and burning quality.  Notes he is unable to get an order from Dr Schmidt's office but has not seen the new doctor taking over for him Patient would like you to order back injection if possible

## 2023-08-22 NOTE — Telephone Encounter (Signed)
Spoke to pt and he states he will need this sent to [facility] imaging -Joni Reining is who he has been speaking with [phone].

## 2023-08-22 NOTE — Telephone Encounter (Signed)
These were ordered today by Dr Margaretha Sheffield who saw pt in office

## 2023-08-22 NOTE — Telephone Encounter (Signed)
Caller name: ELIHUE LUNDSTEN  On DPR?: Yes  Call back number: [phone] (mobile)  Provider they see: Sheliah Hatch, MD  Reason for call:   Injections for his lower back needs a referral

## 2023-08-23 NOTE — Telephone Encounter (Signed)
Noted. Thanks.

## 2023-08-23 NOTE — Discharge Instructions (Signed)

## 2023-08-24 ENCOUNTER — Ambulatory Visit
Admission: RE | Admit: 2023-08-24 | Discharge: 2023-08-24 | Disposition: A | Discharge status: Home / Self Care | Payer: Medicare Other | Source: Ambulatory Visit | Attending: Sports Medicine | Admitting: Sports Medicine

## 2023-08-24 ENCOUNTER — Ambulatory Visit: Payer: Medicare Other | Admitting: Physical Therapy

## 2023-08-24 DIAGNOSIS — M4727 Other spondylosis with radiculopathy, lumbosacral region: Secondary | ICD-10-CM | POA: Diagnosis not present

## 2023-08-24 DIAGNOSIS — M5416 Radiculopathy, lumbar region: Secondary | ICD-10-CM

## 2023-08-24 MED ORDER — IOPAMIDOL (ISOVUE-M 200) INJECTION 41%
1.0000 mL | Freq: Once | INTRAMUSCULAR | Status: AC
  Administered 2023-08-24: 1 mL via EPIDURAL

## 2023-08-24 MED ORDER — METHYLPREDNISOLONE ACETATE 40 MG/ML INJ SUSP (RADIOLOG
80.0000 mg | Freq: Once | INTRAMUSCULAR | Status: AC
  Administered 2023-08-24: 80 mg via EPIDURAL

## 2023-08-25 ENCOUNTER — Other Ambulatory Visit: Payer: Self-pay

## 2023-08-27 ENCOUNTER — Encounter: Payer: Self-pay | Admitting: Family Medicine

## 2023-08-27 ENCOUNTER — Encounter: Payer: Self-pay | Admitting: Sports Medicine

## 2023-08-28 ENCOUNTER — Other Ambulatory Visit (INDEPENDENT_AMBULATORY_CARE_PROVIDER_SITE_OTHER): Payer: Self-pay

## 2023-08-28 ENCOUNTER — Encounter: Payer: Self-pay | Admitting: Family Medicine

## 2023-08-28 ENCOUNTER — Telehealth: Payer: Self-pay | Admitting: Family Medicine

## 2023-08-28 ENCOUNTER — Ambulatory Visit (HOSPITAL_BASED_OUTPATIENT_CLINIC_OR_DEPARTMENT_OTHER)
Admission: RE | Admit: 2023-08-28 | Discharge: 2023-08-28 | Disposition: A | Discharge status: Home / Self Care | Payer: Medicare Other | Source: Ambulatory Visit | Attending: Family Medicine | Admitting: Family Medicine

## 2023-08-28 ENCOUNTER — Ambulatory Visit (INDEPENDENT_AMBULATORY_CARE_PROVIDER_SITE_OTHER): Payer: Medicare Other | Admitting: Family Medicine

## 2023-08-28 VITALS — BP 136/76 | HR 86 | Temp 98.7°F | Wt 183.6 lb

## 2023-08-28 DIAGNOSIS — R748 Abnormal levels of other serum enzymes: Secondary | ICD-10-CM

## 2023-08-28 DIAGNOSIS — E785 Hyperlipidemia, unspecified: Secondary | ICD-10-CM | POA: Diagnosis not present

## 2023-08-28 DIAGNOSIS — K8020 Calculus of gallbladder without cholecystitis without obstruction: Secondary | ICD-10-CM | POA: Diagnosis not present

## 2023-08-28 DIAGNOSIS — B9561 Methicillin susceptible Staphylococcus aureus infection as the cause of diseases classified elsewhere: Secondary | ICD-10-CM | POA: Diagnosis not present

## 2023-08-28 DIAGNOSIS — Z79899 Other long term (current) drug therapy: Secondary | ICD-10-CM | POA: Diagnosis not present

## 2023-08-28 DIAGNOSIS — R61 Generalized hyperhidrosis: Secondary | ICD-10-CM

## 2023-08-28 DIAGNOSIS — M4316 Spondylolisthesis, lumbar region: Secondary | ICD-10-CM | POA: Diagnosis not present

## 2023-08-28 DIAGNOSIS — Z6825 Body mass index (BMI) 25.0-25.9, adult: Secondary | ICD-10-CM | POA: Diagnosis not present

## 2023-08-28 DIAGNOSIS — E7800 Pure hypercholesterolemia, unspecified: Secondary | ICD-10-CM | POA: Diagnosis not present

## 2023-08-28 DIAGNOSIS — Z7982 Long term (current) use of aspirin: Secondary | ICD-10-CM | POA: Diagnosis not present

## 2023-08-28 DIAGNOSIS — I2510 Atherosclerotic heart disease of native coronary artery without angina pectoris: Secondary | ICD-10-CM | POA: Diagnosis not present

## 2023-08-28 DIAGNOSIS — R634 Abnormal weight loss: Secondary | ICD-10-CM | POA: Diagnosis not present

## 2023-08-28 DIAGNOSIS — Z8249 Family history of ischemic heart disease and other diseases of the circulatory system: Secondary | ICD-10-CM | POA: Diagnosis not present

## 2023-08-28 DIAGNOSIS — G8929 Other chronic pain: Secondary | ICD-10-CM | POA: Diagnosis not present

## 2023-08-28 DIAGNOSIS — E43 Unspecified severe protein-calorie malnutrition: Secondary | ICD-10-CM | POA: Diagnosis not present

## 2023-08-28 DIAGNOSIS — R296 Repeated falls: Secondary | ICD-10-CM | POA: Diagnosis not present

## 2023-08-28 DIAGNOSIS — Z981 Arthrodesis status: Secondary | ICD-10-CM | POA: Diagnosis not present

## 2023-08-28 DIAGNOSIS — M6088 Other myositis, other site: Secondary | ICD-10-CM | POA: Diagnosis not present

## 2023-08-28 DIAGNOSIS — W182XXA Fall in (into) shower or empty bathtub, initial encounter: Secondary | ICD-10-CM | POA: Diagnosis not present

## 2023-08-28 DIAGNOSIS — M5416 Radiculopathy, lumbar region: Secondary | ICD-10-CM | POA: Diagnosis not present

## 2023-08-28 DIAGNOSIS — R7989 Other specified abnormal findings of blood chemistry: Secondary | ICD-10-CM | POA: Diagnosis not present

## 2023-08-28 DIAGNOSIS — K6812 Psoas muscle abscess: Secondary | ICD-10-CM | POA: Diagnosis not present

## 2023-08-28 DIAGNOSIS — R531 Weakness: Secondary | ICD-10-CM | POA: Diagnosis not present

## 2023-08-28 DIAGNOSIS — M51379 Other intervertebral disc degeneration, lumbosacral region without mention of lumbar back pain or lower extremity pain: Secondary | ICD-10-CM | POA: Diagnosis not present

## 2023-08-28 DIAGNOSIS — I10 Essential (primary) hypertension: Secondary | ICD-10-CM | POA: Diagnosis not present

## 2023-08-28 DIAGNOSIS — M4656 Other infective spondylopathies, lumbar region: Secondary | ICD-10-CM | POA: Diagnosis not present

## 2023-08-28 DIAGNOSIS — M609 Myositis, unspecified: Secondary | ICD-10-CM | POA: Diagnosis not present

## 2023-08-28 DIAGNOSIS — F419 Anxiety disorder, unspecified: Secondary | ICD-10-CM | POA: Diagnosis not present

## 2023-08-28 DIAGNOSIS — K5730 Diverticulosis of large intestine without perforation or abscess without bleeding: Secondary | ICD-10-CM | POA: Diagnosis not present

## 2023-08-28 DIAGNOSIS — M47816 Spondylosis without myelopathy or radiculopathy, lumbar region: Secondary | ICD-10-CM | POA: Diagnosis not present

## 2023-08-28 DIAGNOSIS — Y929 Unspecified place or not applicable: Secondary | ICD-10-CM | POA: Diagnosis not present

## 2023-08-28 DIAGNOSIS — M51369 Other intervertebral disc degeneration, lumbar region without mention of lumbar back pain or lower extremity pain: Secondary | ICD-10-CM | POA: Diagnosis not present

## 2023-08-28 DIAGNOSIS — D649 Anemia, unspecified: Secondary | ICD-10-CM | POA: Diagnosis not present

## 2023-08-28 DIAGNOSIS — K5900 Constipation, unspecified: Secondary | ICD-10-CM | POA: Diagnosis not present

## 2023-08-28 DIAGNOSIS — R7881 Bacteremia: Secondary | ICD-10-CM | POA: Diagnosis not present

## 2023-08-28 DIAGNOSIS — M47814 Spondylosis without myelopathy or radiculopathy, thoracic region: Secondary | ICD-10-CM | POA: Diagnosis not present

## 2023-08-28 DIAGNOSIS — G061 Intraspinal abscess and granuloma: Secondary | ICD-10-CM | POA: Diagnosis not present

## 2023-08-28 DIAGNOSIS — Y93E1 Activity, personal bathing and showering: Secondary | ICD-10-CM | POA: Diagnosis not present

## 2023-08-28 DIAGNOSIS — M48061 Spinal stenosis, lumbar region without neurogenic claudication: Secondary | ICD-10-CM | POA: Diagnosis not present

## 2023-08-28 DIAGNOSIS — G062 Extradural and subdural abscess, unspecified: Secondary | ICD-10-CM | POA: Diagnosis not present

## 2023-08-28 DIAGNOSIS — K219 Gastro-esophageal reflux disease without esophagitis: Secondary | ICD-10-CM | POA: Diagnosis not present

## 2023-08-28 LAB — HEPATIC FUNCTION PANEL
ALT: 210 U/L — ABNORMAL HIGH (ref 0–53)
AST: 93 U/L — ABNORMAL HIGH (ref 0–37)
Albumin: 3.4 g/dL — ABNORMAL LOW (ref 3.5–5.2)
Alkaline Phosphatase: 156 U/L — ABNORMAL HIGH (ref 39–117)
Bilirubin, Direct: 0 mg/dL (ref 0.0–0.3)
Total Bilirubin: 0.4 mg/dL (ref 0.2–1.2)
Total Protein: 7.4 g/dL (ref 6.0–8.3)

## 2023-08-28 LAB — PSA: PSA: 4.11 ng/mL — ABNORMAL HIGH (ref 0.10–4.00)

## 2023-08-28 LAB — CBC WITH DIFFERENTIAL/PLATELET
Basophils Absolute: 0.1 10*3/uL (ref 0.0–0.1)
Basophils Relative: 1.1 % (ref 0.0–3.0)
Eosinophils Absolute: 0 10*3/uL (ref 0.0–0.7)
Eosinophils Relative: 0.5 % (ref 0.0–5.0)
HCT: 34.3 % — ABNORMAL LOW (ref 39.0–52.0)
Hemoglobin: 11.5 g/dL — ABNORMAL LOW (ref 13.0–17.0)
Lymphocytes Relative: 7.4 % — ABNORMAL LOW (ref 12.0–46.0)
Lymphs Abs: 0.7 10*3/uL (ref 0.7–4.0)
MCHC: 33.5 g/dL (ref 30.0–36.0)
MCV: 87.2 fL (ref 78.0–100.0)
Monocytes Absolute: 0.7 10*3/uL (ref 0.1–1.0)
Monocytes Relative: 7.3 % (ref 3.0–12.0)
Neutro Abs: 8.2 10*3/uL — ABNORMAL HIGH (ref 1.4–7.7)
Neutrophils Relative %: 83.7 % — ABNORMAL HIGH (ref 43.0–77.0)
Platelets: 429 10*3/uL — ABNORMAL HIGH (ref 150.0–400.0)
RBC: 3.93 Mil/uL — ABNORMAL LOW (ref 4.22–5.81)
RDW: 13.2 % (ref 11.5–15.5)
WBC: 9.8 10*3/uL (ref 4.0–10.5)

## 2023-08-28 LAB — SEDIMENTATION RATE: Sed Rate: 111 mm/h — ABNORMAL HIGH (ref 0–20)

## 2023-08-28 NOTE — Progress Notes (Signed)
Subjective:    Patient ID: Dan Sanchez, male    DOB: [birth year], 67 y.o.   MRN: [id number]  HPI Weight loss- pt is down 13 lbs in 20 days.  Pt reports he is not eating.  He thinks it's due to his back pain.  Did eat 3 meals yesterday.  He got an epidural injxn last week w/ some relief.  'i have no energy'.  Continues to have night sweats- sweating through multiple shirts nightly.  Pt denies pain anywhere other than back.  Had recent lumbar films and 'all it showed was arthritis'.  I don't have access to see the films b/c they were done at Ortho.  Denies CP, SOB, swelling, urinary sxs, cough, HA.   Review of Systems For ROS see HPI     Objective:   Physical Exam Vitals reviewed.  Constitutional:      General: He is not in acute distress.    Appearance: He is ill-appearing (looks thin, almost frail).  HENT:     Head: Normocephalic and atraumatic.     Mouth/Throat:     Pharynx: No oropharyngeal exudate or posterior oropharyngeal erythema.  Eyes:     General: No scleral icterus.    Extraocular Movements: Extraocular movements intact.     Conjunctiva/sclera: Conjunctivae normal.  Cardiovascular:     Rate and Rhythm: Normal rate and regular rhythm.  Pulmonary:     Effort: Pulmonary effort is normal. No respiratory distress.     Breath sounds: No wheezing or rhonchi.  Abdominal:     General: There is no distension.     Palpations: Abdomen is soft.     Tenderness: There is no abdominal tenderness. There is no guarding.  Musculoskeletal:     Cervical back: Neck supple. No rigidity.  Lymphadenopathy:     Cervical: No cervical adenopathy.  Skin:    General: Skin is warm and dry.     Coloration: Skin is not jaundiced.  Neurological:     General: No focal deficit present.     Mental Status: He is alert and oriented to person, place, and time.  Psychiatric:     Comments: Appropriately anxious, tearful           Assessment & Plan:  Night sweats, weight loss, back pain-  deteriorated.  Pt has lost 13 lbs in 20 day.  Reports he has no appetite.  Has not been eating.  Feels this may be pain related.  Night sweats, fatigue, and weight loss all started after he developed back pain last month.  Epidural has improved pain, but sweats and fatigue continue.  Will get CXR, TB screen to r/o pulmonary cause.  Will repeat LFTs (previously elevated), CBC to trend values.  Get ANA, ESR to assess inflammatory/autoimmune condition.  Check PSA for possible malignancy as prostate cancer can have bony involvement.  Get MRI to assess for possible discitis or inflammatory/infectious process.  Reviewed supportive care and red flags that should prompt return.  Pt expressed understanding and is in agreement w/ plan.

## 2023-08-28 NOTE — Telephone Encounter (Signed)
I want the updated MRI of his back to look for possible discitis given his pain, night sweats and weight loss.  The MRI will tell us if there is anything else wrong w/ his back.  I'm not opposed to doing the CT scans, but since they require prior authorization, I need to do the chest xray first.  If the chest xray is unremarkable (which I'm assuming/hoping) we can then proceed to the CT scans

## 2023-08-28 NOTE — Telephone Encounter (Signed)
Wife called asking if there was any way a CT scan could be ordered for pt's abdomen, chest and pelvis. She is concerned that something else going on may be missed that the CT scan would pick up. She expressed concern and wanting to get her husband feeling better. [name] wife asked if someone could call and give her an update

## 2023-08-28 NOTE — Patient Instructions (Signed)
Follow up as needed or as scheduled GO to [facility] and get your chest xray done- no appt needed We'll notify you of your lab results and make any changes if needed We'll call you to schedule your MRI- I want to rule out infection of your spine/discitis Try and eat more regularly Call with any questions or concerns Hang in there! We'll keep digging!!!

## 2023-08-28 NOTE — Telephone Encounter (Signed)
Called and spoke to patient's wife Angie Lassen who is on [place]. I informed her of Dr. Rennis Golden. Mrs. Balfour stated that she just arrived home and that Kani is red and has a temp of 99.7. She also stated that she does not want to overstep but she has 2 people in her ear and that 1 is a family friend who is a Biomedical engineer who is telling her to take Shad to the emergency room to get the scans quicker and that she wants to know if Dr. Beverely Low is in agreeable.   Spoke with Dr. Beverely Low and she asked if I gave the information she sent in her telephone note. When I informed her that I did and that wife stated that she has a cardiothoracic surgeon telling her to take Stanely to the ED. Dr. Beverely Low asked me to reiterate her note and that she if the wife wants to take patient to the ED she can she just doesn't want him to sit in the ED get exposed to germs and possible get sick. That if patient worsen or does not improve that Mrs. Dombkowski may take her husband to the nearest urgent care and/or ED.   Once Mrs. Schobert was informed of that she thanked me for calling back so quickly and that she/they will wait to hear back from our office about he chest x-ray. She verbalized understanding and all (if any) questions were answered.

## 2023-08-28 NOTE — Telephone Encounter (Signed)
Pt was seen in office 08/28/2023

## 2023-08-28 NOTE — Telephone Encounter (Signed)
Error

## 2023-08-28 NOTE — Telephone Encounter (Signed)
Patient asking if he should go to the ER given his sxs and feeling so poorly, notes low grade fever 99.7

## 2023-08-29 ENCOUNTER — Encounter: Payer: Self-pay | Admitting: Family Medicine

## 2023-08-29 ENCOUNTER — Telehealth: Payer: Self-pay | Admitting: Family Medicine

## 2023-08-29 ENCOUNTER — Ambulatory Visit: Payer: Medicare Other | Admitting: Physical Therapy

## 2023-08-29 ENCOUNTER — Other Ambulatory Visit: Payer: Self-pay

## 2023-08-29 ENCOUNTER — Other Ambulatory Visit: Payer: Self-pay | Admitting: *Deleted

## 2023-08-29 ENCOUNTER — Telehealth: Payer: Self-pay

## 2023-08-29 ENCOUNTER — Inpatient Hospital Stay (HOSPITAL_BASED_OUTPATIENT_CLINIC_OR_DEPARTMENT_OTHER)
Admission: EM | Admit: 2023-08-29 | Discharge: 2023-09-04 | DRG: 028 | Disposition: A | Discharge status: Home Health | Payer: Medicare Other | Attending: Student | Admitting: Student

## 2023-08-29 ENCOUNTER — Emergency Department (HOSPITAL_BASED_OUTPATIENT_CLINIC_OR_DEPARTMENT_OTHER): Payer: Medicare Other

## 2023-08-29 ENCOUNTER — Encounter (HOSPITAL_BASED_OUTPATIENT_CLINIC_OR_DEPARTMENT_OTHER): Payer: Self-pay | Admitting: Emergency Medicine

## 2023-08-29 ENCOUNTER — Emergency Department (HOSPITAL_COMMUNITY): Payer: Medicare Other

## 2023-08-29 ENCOUNTER — Ambulatory Visit (HOSPITAL_BASED_OUTPATIENT_CLINIC_OR_DEPARTMENT_OTHER)
Admission: RE | Admit: 2023-08-29 | Discharge: 2023-08-29 | Disposition: A | Discharge status: Home / Self Care | Payer: Medicare Other | Source: Ambulatory Visit | Attending: Family Medicine | Admitting: Family Medicine

## 2023-08-29 DIAGNOSIS — R61 Generalized hyperhidrosis: Secondary | ICD-10-CM | POA: Insufficient documentation

## 2023-08-29 DIAGNOSIS — M609 Myositis, unspecified: Secondary | ICD-10-CM

## 2023-08-29 DIAGNOSIS — E7800 Pure hypercholesterolemia, unspecified: Secondary | ICD-10-CM | POA: Diagnosis not present

## 2023-08-29 DIAGNOSIS — W182XXA Fall in (into) shower or empty bathtub, initial encounter: Secondary | ICD-10-CM | POA: Diagnosis present

## 2023-08-29 DIAGNOSIS — K5900 Constipation, unspecified: Secondary | ICD-10-CM | POA: Diagnosis present

## 2023-08-29 DIAGNOSIS — E43 Unspecified severe protein-calorie malnutrition: Secondary | ICD-10-CM | POA: Diagnosis present

## 2023-08-29 DIAGNOSIS — B9561 Methicillin susceptible Staphylococcus aureus infection as the cause of diseases classified elsewhere: Secondary | ICD-10-CM | POA: Diagnosis present

## 2023-08-29 DIAGNOSIS — Z79899 Other long term (current) drug therapy: Secondary | ICD-10-CM | POA: Diagnosis not present

## 2023-08-29 DIAGNOSIS — Y929 Unspecified place or not applicable: Secondary | ICD-10-CM

## 2023-08-29 DIAGNOSIS — Z6825 Body mass index (BMI) 25.0-25.9, adult: Secondary | ICD-10-CM

## 2023-08-29 DIAGNOSIS — K219 Gastro-esophageal reflux disease without esophagitis: Secondary | ICD-10-CM | POA: Diagnosis present

## 2023-08-29 DIAGNOSIS — K8020 Calculus of gallbladder without cholecystitis without obstruction: Secondary | ICD-10-CM | POA: Diagnosis present

## 2023-08-29 DIAGNOSIS — R634 Abnormal weight loss: Secondary | ICD-10-CM | POA: Insufficient documentation

## 2023-08-29 DIAGNOSIS — R748 Abnormal levels of other serum enzymes: Secondary | ICD-10-CM | POA: Insufficient documentation

## 2023-08-29 DIAGNOSIS — Y93E1 Activity, personal bathing and showering: Secondary | ICD-10-CM | POA: Diagnosis not present

## 2023-08-29 DIAGNOSIS — M48061 Spinal stenosis, lumbar region without neurogenic claudication: Secondary | ICD-10-CM | POA: Diagnosis present

## 2023-08-29 DIAGNOSIS — R7881 Bacteremia: Secondary | ICD-10-CM | POA: Diagnosis present

## 2023-08-29 DIAGNOSIS — G061 Intraspinal abscess and granuloma: Secondary | ICD-10-CM | POA: Diagnosis present

## 2023-08-29 DIAGNOSIS — R531 Weakness: Secondary | ICD-10-CM | POA: Diagnosis present

## 2023-08-29 DIAGNOSIS — E785 Hyperlipidemia, unspecified: Secondary | ICD-10-CM | POA: Diagnosis present

## 2023-08-29 DIAGNOSIS — K5730 Diverticulosis of large intestine without perforation or abscess without bleeding: Secondary | ICD-10-CM | POA: Diagnosis present

## 2023-08-29 DIAGNOSIS — R7989 Other specified abnormal findings of blood chemistry: Secondary | ICD-10-CM

## 2023-08-29 DIAGNOSIS — Z8249 Family history of ischemic heart disease and other diseases of the circulatory system: Secondary | ICD-10-CM

## 2023-08-29 DIAGNOSIS — I2510 Atherosclerotic heart disease of native coronary artery without angina pectoris: Secondary | ICD-10-CM | POA: Diagnosis present

## 2023-08-29 DIAGNOSIS — M51379 Other intervertebral disc degeneration, lumbosacral region without mention of lumbar back pain or lower extremity pain: Secondary | ICD-10-CM | POA: Diagnosis present

## 2023-08-29 DIAGNOSIS — G8929 Other chronic pain: Secondary | ICD-10-CM | POA: Diagnosis present

## 2023-08-29 DIAGNOSIS — Z7982 Long term (current) use of aspirin: Secondary | ICD-10-CM

## 2023-08-29 DIAGNOSIS — R972 Elevated prostate specific antigen [PSA]: Secondary | ICD-10-CM

## 2023-08-29 DIAGNOSIS — M4656 Other infective spondylopathies, lumbar region: Secondary | ICD-10-CM

## 2023-08-29 DIAGNOSIS — M6088 Other myositis, other site: Secondary | ICD-10-CM | POA: Diagnosis present

## 2023-08-29 DIAGNOSIS — R296 Repeated falls: Secondary | ICD-10-CM | POA: Diagnosis present

## 2023-08-29 DIAGNOSIS — M47816 Spondylosis without myelopathy or radiculopathy, lumbar region: Secondary | ICD-10-CM

## 2023-08-29 DIAGNOSIS — D649 Anemia, unspecified: Secondary | ICD-10-CM

## 2023-08-29 DIAGNOSIS — I10 Essential (primary) hypertension: Secondary | ICD-10-CM | POA: Diagnosis present

## 2023-08-29 DIAGNOSIS — F419 Anxiety disorder, unspecified: Secondary | ICD-10-CM | POA: Diagnosis present

## 2023-08-29 LAB — CBC WITH DIFFERENTIAL/PLATELET
Abs Immature Granulocytes: 0.06 10*3/uL (ref 0.00–0.07)
Basophils Absolute: 0 10*3/uL (ref 0.0–0.1)
Basophils Relative: 0 %
Eosinophils Absolute: 0 10*3/uL (ref 0.0–0.5)
Eosinophils Relative: 0 %
HCT: 32.6 % — ABNORMAL LOW (ref 39.0–52.0)
Hemoglobin: 10.6 g/dL — ABNORMAL LOW (ref 13.0–17.0)
Immature Granulocytes: 1 %
Lymphocytes Relative: 9 %
Lymphs Abs: 1 10*3/uL (ref 0.7–4.0)
MCH: 28 pg (ref 26.0–34.0)
MCHC: 32.5 g/dL (ref 30.0–36.0)
MCV: 86 fL (ref 80.0–100.0)
Monocytes Absolute: 1 10*3/uL (ref 0.1–1.0)
Monocytes Relative: 9 %
Neutro Abs: 8.4 10*3/uL — ABNORMAL HIGH (ref 1.7–7.7)
Neutrophils Relative %: 81 %
Platelets: 357 10*3/uL (ref 150–400)
RBC: 3.79 MIL/uL — ABNORMAL LOW (ref 4.22–5.81)
RDW: 12.5 % (ref 11.5–15.5)
WBC: 10.4 10*3/uL (ref 4.0–10.5)
nRBC: 0 % (ref 0.0–0.2)

## 2023-08-29 LAB — COMPREHENSIVE METABOLIC PANEL
ALT: 141 U/L — ABNORMAL HIGH (ref 0–44)
AST: 37 U/L (ref 15–41)
Albumin: 2.5 g/dL — ABNORMAL LOW (ref 3.5–5.0)
Alkaline Phosphatase: 134 U/L — ABNORMAL HIGH (ref 38–126)
Anion gap: 11 (ref 5–15)
BUN: 12 mg/dL (ref 8–23)
CO2: 23 mmol/L (ref 22–32)
Calcium: 8.4 mg/dL — ABNORMAL LOW (ref 8.9–10.3)
Chloride: 98 mmol/L (ref 98–111)
Creatinine, Ser: 0.76 mg/dL (ref 0.61–1.24)
GFR, Estimated: >60 mL/min (ref >60)
Glucose, Bld: 110 mg/dL — ABNORMAL HIGH (ref 70–99)
Potassium: 3.7 mmol/L (ref 3.5–5.1)
Sodium: 132 mmol/L — ABNORMAL LOW (ref 135–145)
Total Bilirubin: 0.5 mg/dL (ref 0.3–1.2)
Total Protein: 6.8 g/dL (ref 6.5–8.1)

## 2023-08-29 LAB — TSH: TSH: 1.131 u[IU]/mL (ref 0.350–4.500)

## 2023-08-29 LAB — SEDIMENTATION RATE: Sed Rate: 130 mm/h — ABNORMAL HIGH (ref 0–16)

## 2023-08-29 LAB — MAGNESIUM: Magnesium: 2.1 mg/dL (ref 1.7–2.4)

## 2023-08-29 LAB — VITAMIN B12: Vitamin B-12: 413 pg/mL (ref 180–914)

## 2023-08-29 MED ORDER — SODIUM CHLORIDE 0.9 % IV SOLN
2.0000 g | Freq: Two times a day (BID) | INTRAVENOUS | Status: DC
  Administered 2023-08-30 (×3): 2 g via INTRAVENOUS
  Filled 2023-08-29 (×4): qty 20

## 2023-08-29 MED ORDER — HYDROMORPHONE HCL 1 MG/ML IJ SOLN
1.0000 mg | Freq: Once | INTRAMUSCULAR | Status: DC
  Filled 2023-08-29: qty 1

## 2023-08-29 MED ORDER — TRAMADOL HCL 50 MG PO TABS
50.0000 mg | ORAL_TABLET | Freq: Three times a day (TID) | ORAL | Status: DC | PRN
  Administered 2023-08-30 – 2023-09-02 (×5): 50 mg via ORAL
  Filled 2023-08-29 (×6): qty 1

## 2023-08-29 MED ORDER — ONDANSETRON HCL 4 MG PO TABS: 4.0000 mg | ORAL_TABLET | Freq: Four times a day (QID) | ORAL | Status: DC | PRN

## 2023-08-29 MED ORDER — SODIUM CHLORIDE 0.9 % IV SOLN
2.0000 g | Freq: Once | INTRAVENOUS | Status: AC
  Administered 2023-08-29: 2 g via INTRAVENOUS
  Filled 2023-08-29: qty 12.5

## 2023-08-29 MED ORDER — GADOBUTROL 1 MMOL/ML IV SOLN
8.0000 mL | Freq: Once | INTRAVENOUS | Status: AC | PRN
  Administered 2023-08-29: 8 mL via INTRAVENOUS

## 2023-08-29 MED ORDER — VANCOMYCIN HCL 1750 MG/350ML IV SOLN
1750.0000 mg | Freq: Once | INTRAVENOUS | Status: AC
  Administered 2023-08-29: 1750 mg via INTRAVENOUS
  Filled 2023-08-29: qty 350

## 2023-08-29 MED ORDER — SODIUM CHLORIDE 0.9 % IV SOLN: 2.0000 g | Freq: Two times a day (BID) | INTRAVENOUS | Status: DC

## 2023-08-29 MED ORDER — SODIUM CHLORIDE 0.9% FLUSH
3.0000 mL | Freq: Two times a day (BID) | INTRAVENOUS | Status: DC
  Administered 2023-08-30 – 2023-09-03 (×9): 3 mL via INTRAVENOUS

## 2023-08-29 MED ORDER — HYDROCODONE-ACETAMINOPHEN 5-325 MG PO TABS
1.0000 | ORAL_TABLET | ORAL | Status: DC | PRN
  Administered 2023-08-30 (×2): 1 via ORAL
  Administered 2023-08-31 (×3): 2 via ORAL
  Administered 2023-08-31: 1 via ORAL
  Administered 2023-09-01 (×2): 2 via ORAL
  Filled 2023-08-29 (×2): qty 1
  Filled 2023-08-29 (×2): qty 2
  Filled 2023-08-29: qty 1
  Filled 2023-08-29 (×3): qty 2
  Filled 2023-08-29: qty 1
  Filled 2023-08-29 (×2): qty 2

## 2023-08-29 MED ORDER — SENNOSIDES-DOCUSATE SODIUM 8.6-50 MG PO TABS: 1.0000 | ORAL_TABLET | Freq: Every evening | ORAL | Status: DC | PRN

## 2023-08-29 MED ORDER — MORPHINE SULFATE (PF) 2 MG/ML IV SOLN: 2.0000 mg | INTRAVENOUS | Status: DC | PRN

## 2023-08-29 MED ORDER — ONDANSETRON HCL 4 MG/2ML IJ SOLN
4.0000 mg | Freq: Four times a day (QID) | INTRAMUSCULAR | Status: DC | PRN
  Administered 2023-08-30: 4 mg via INTRAVENOUS

## 2023-08-29 MED ORDER — OXYCODONE-ACETAMINOPHEN 5-325 MG PO TABS
1.0000 | ORAL_TABLET | Freq: Once | ORAL | Status: AC
  Administered 2023-08-29: 1 via ORAL
  Filled 2023-08-29: qty 1

## 2023-08-29 MED ORDER — BISACODYL 5 MG PO TBEC
5.0000 mg | DELAYED_RELEASE_TABLET | Freq: Every day | ORAL | Status: DC | PRN
  Administered 2023-09-01: 5 mg via ORAL
  Filled 2023-08-29: qty 1

## 2023-08-29 MED ORDER — IOHEXOL 300 MG/ML  SOLN
100.0000 mL | Freq: Once | INTRAMUSCULAR | Status: AC | PRN
  Administered 2023-08-29: 100 mL via INTRAVENOUS

## 2023-08-29 NOTE — Telephone Encounter (Signed)
-----   Message from Neena Rhymes sent at 08/28/2023  6:24 PM EDT ----- Your liver enzymes are actually higher than earlier this month.  First thing in the morning, I will have our girls work on getting a pre-cert for a CT scan of abdomen and pelvis.  Again, if this information changes your mind and you want to pursue an ER workup tonight, I completely understand.  But if you do go, PLEASE wear a mask.

## 2023-08-29 NOTE — Addendum Note (Signed)
Addended by: Eldred Manges on: 08/29/2023 08:35 AM   Modules accepted: Orders

## 2023-08-29 NOTE — ED Triage Notes (Signed)
Bilateral leg weakness which caused to fall twice today while showering , epidural last week . Was seen today and had CT scan done , presents with left IV access done at CT department , also has MRI scheduled in two days .  Denies incontinence .  Alert and oriented x 4 .

## 2023-08-29 NOTE — Telephone Encounter (Signed)
Please advise 

## 2023-08-29 NOTE — Telephone Encounter (Signed)
-----   Message from Dan Sanchez sent at 08/29/2023  4:24 PM EDT ----- You have 2 gallstones that may be causing the increased liver enzymes but they appear unremarkable  Also shows known degenerative disc disease in your lower back w/ some hip arthritis  Some calcification of your coronary artery (which we will refer to Cardiology for once you are feeling better)  The only other thing of note in the body of the report is an enlarged prostate.  We have already placed a urology referral based on your mildly elevated PSA yesterday.  They don't see anything to account for fever, weight loss, or night sweats.  This is good news, but I still want to do the MRI of your back on Thursday to rule out infection.

## 2023-08-29 NOTE — Telephone Encounter (Signed)
Agree w/ advice given.  If legs won't hold him or won't move- this is an emergency and needs to be treated as such.  Either 911 or someone should drive him to ER.

## 2023-08-29 NOTE — Telephone Encounter (Signed)
I received a phone call from the pt, he state that while taking a shower he states that his legs wouldn't move. He states that he dropped down to the floor and got out. Now he is fine. His son is on his way to come get him to take him to get the CT scan now.    I did advise him if that happens again that he needs to call 911.   Please advise

## 2023-08-29 NOTE — Telephone Encounter (Signed)
[name] son called expressing concern that pt is experiencing numbness in his legs. Pt was previously advised that if his symptoms worsened or he was experiencing any new symptoms to go straight to the ER

## 2023-08-29 NOTE — Telephone Encounter (Signed)
-----   Message from Neena Rhymes sent at 08/28/2023  6:18 PM EDT ----- Your PSA level has jumped from 1.79 --> 4.11 in just 9 months.  There can be multiple reasons for this but we will refer you to urology for a complete evaluation (refer urology, dx elevated PSA)  Your Sedimentation Rate (a marker of inflammation in your body) is quite elevated.  This could indicate infection in your back or some other inflammatory/infectious process.  Your white blood cell count is not elevated (usually indicative of infection) but your higher percentage of Neutrophils and lower percentage of Lymphocytes can be seen with infection.  I am going to call radiology in the morning to get the CXR report (if they don't read it tonight) and we will absolutely continue our workup into what's going on.  Again, if things change or worsen, please go to the ER

## 2023-08-29 NOTE — H&P (Signed)
History and Physical    Dan Sanchez ZOX:[id number] DOB: 1956-02-07 DOA: 08/29/2023  PCP: Sheliah Hatch, MD  Patient coming from: Home  I have personally briefly reviewed patient's old medical records in [facility]  Chief Complaint: Back pain, leg weakness, night sweats, weight loss  HPI: Dan Sanchez is a 67 y.o. male with medical history significant for HTN, HLD who presented to the ED for evaluation of night sweats, weight loss, back pain with lower extremity weakness.  Patient states he hurt his back about 1 month ago after he lifted his lawn more.  He has been having persistent back pain since then.  He has been undergoing dry needling and muscle stimulation.  He says last week he had an epidural steroid injection with some relief.  Over the last few weeks patient has had night sweats, weight loss, and worsening back pain.  He has lost 13 pounds in 20 days.  Appetite has been low.  He was seen by his PCP yesterday.  Labs were obtained and notable for ESR 111, PSA 4.11, AST 93, ALT 210.  He was arranged to have chest x-ray and MRI L-spine.  Today patient had low-grade fever at home.  He developed bilateral lower extremity weakness while taking a shower.  He says his legs gave out and he dropped down to the floor.  He says his strength is better now.  He denies any chest pain, dyspnea, nausea, vomiting, abdominal pain, dysuria.  He presented to an [facility] ED for further evaluation.  ED Course  Labs/Imaging on admission: I have personally reviewed following labs and imaging studies.  Initial vitals showed BP 156/90, pulse 102, RR 16, temp 97.9 F, SpO2 98% on room air.  Labs showed WBC 10.4, hemoglobin 10.6, platelets 357,000, sodium 132, potassium 3.7, bicarb 23, BUN 12, creatinine 0.76, serum glucose 110, AST 37, ALT 141, alk phos 134, total bilirubin 0.5, B12 413, TSH 1.131.  CT chest/abdomen/pelvis was obtained without specific cause of patient's  symptoms identified.  Cholelithiasis, sigmoid colon diverticulosis, lumbar spondylosis and degenerative disc disease were seen.  CT L-spine was obtained and showed lumbar spondylosis, congenitally short pedicles, degenerative disc disease causing prominent impingement at L4-5 and moderate impingement at L2-3, L3-4, L5-S1.  Patient was transferred ED to ED to [facility] for MRI imaging.  MRI lumbar and thoracic spine with and without contrast was obtained.  This showed: 1.  Severe facet arthrosis and edema at L4-5, consistent with septic arthritis. 2.  Dorsal epidural abscess extending from L3-S1 measuring up to 6 mm in thickness. 3.  Right psoas and bilateral paraspinous muscle myositis with multiple old small abscesses. 4.  Severe spinal canal stenosis at L3-4 and L4-5. 5.  No acute abnormality of the thoracic spine.  Mild thoracic degenerative changes.  Blood cultures collected and pending.  Patient was given IV vancomycin and cefepime.  EDP discussed with neurosurgery Dr. Johnsie Cancel who recommended medical admission.  They will see in consultation with plans for lumbar laminectomy with evacuation of epidural abscess 10/2 afternoon.  The hospitalist service was consulted to admit for further evaluation.  Review of Systems: All systems reviewed and are negative except as documented in history of present illness above.   Past Medical History:  Diagnosis Date   Allergy    CAD (coronary artery disease)    mild non-obstructive   GERD (gastroesophageal reflux disease)    Hyperlipidemia    S/P right rotator cuff repair  Sinusitis, bacterial    Urinary incontinence     Past Surgical History:  Procedure Laterality Date   COLONOSCOPY  04/08/2009   LEFT HEART CATHETERIZATION WITH CORONARY ANGIOGRAM N/A 08/13/2014   Procedure: LEFT HEART CATHETERIZATION WITH CORONARY ANGIOGRAM;  Surgeon: Kathleene Hazel, MD;  Location: [facility] CATH LAB;  Service: Cardiovascular;  Laterality: N/A;   SHOULDER  ARTHROSCOPY WITH ROTATOR CUFF REPAIR Right 10/26/2017   Procedure: RIGHT SHOULDER ARTHROSCOPY WITH ROTATOR CUFF REPAIR AND DEBRIDEMENT;  Surgeon: Eugenia Mcalpine, MD;  Location: [facility];  Service: Orthopedics;  Laterality: Right;   SHOULDER SURGERY     Right shoulder     Social History:  reports that he has never smoked. He has never used smokeless tobacco. He reports current alcohol use of about 4.0 standard drinks of alcohol per week. He reports that he does not use drugs.  Allergies  Allergen Reactions   Bee Venom Swelling    Family History  Problem Relation Age of Onset   Heart disease Father        MI age 88   Colon cancer Neg Hx    Esophageal cancer Neg Hx    Rectal cancer Neg Hx    Stomach cancer Neg Hx    Colon polyps Neg Hx    Sleep apnea Neg Hx      Prior to Admission medications   Medication Sig Start Date End Date Taking? Authorizing Provider  amLODipine (NORVASC) 10 MG tablet Take 1 tablet (10 mg total) by mouth daily. 12/02/22 12/02/23  Kathleene Hazel, MD  ascorbic acid (VITAMIN C) 1000 MG tablet Take by mouth. 02/01/19   [provider]  aspirin EC 81 MG tablet Take 1 tablet (81 mg total) by mouth daily. Swallow whole. 10/26/20   Kathleene Hazel, MD  celecoxib (CELEBREX) 200 MG capsule Take 1 capsule (200 mg total) by mouth 2 (two) times daily. 08/16/23     cetirizine (ZYRTEC) 10 MG tablet Take by mouth. 02/01/19   [provider]  fexofenadine (ALLEGRA) 180 MG tablet [facility]    [provider]  fluticasone (FLONASE) 50 MCG/ACT nasal spray Place 2 sprays into both nostrils daily. 06/28/18   Waldon Merl, PA-C  methocarbamol (ROBAXIN) 500 MG tablet Take 1 tablet (500 mg total) by mouth 3 (three) times daily as needed. 08/16/23     methocarbamol (ROBAXIN) 500 MG tablet Take 1 tablet (500 mg total) by mouth 3 (three) times daily as needed for 14 days 08/21/23     Omega-3 Fatty Acids (FISH OIL) 1200 MG  CAPS Take 1,200 mg by mouth 2 (two) times daily.    [provider]  omeprazole (PRILOSEC) 20 MG capsule Take 1 capsule (20 mg total) by mouth daily. 04/18/23 04/17/24  Sheliah Hatch, MD  predniSONE (DELTASONE) 10 MG tablet Take 3 tablets by mouth daily x 3 days and then 2 tabs x3 days and then 1 tab x3 days.  Take w/ food. 08/09/23   Sheliah Hatch, MD  rosuvastatin (CRESTOR) 40 MG tablet Take 1 tablet (40 mg total) by mouth daily. 12/02/22   Kathleene Hazel, MD  valACYclovir (VALTREX) 1000 MG tablet  02/01/19   [provider]    Physical Exam: Vitals:   08/29/23 1657 08/29/23 1703 08/29/23 1946 08/29/23 2218  BP:  (!) 158/74 131/67 136/67  Pulse:  85 87 97  Resp:  15 16 16   Temp: 98 F (36.7 C)  (!) 100.6 F (38.1 C)  SpO2:  97% 96% 100%  Weight:       Constitutional: Resting in bed, NAD, calm, comfortable Eyes: EOMI, lids and conjunctivae normal ENMT: Mucous membranes are moist. Posterior pharynx clear of any exudate or lesions.Normal dentition.  Neck: normal, supple, no masses. Respiratory: clear to auscultation bilaterally, no wheezing, no crackles. Normal respiratory effort. No accessory muscle use.  Cardiovascular: Regular rate and rhythm, no murmurs / rubs / gallops. No extremity edema. 2+ pedal pulses. Abdomen: no tenderness, no masses palpated.  Musculoskeletal: Mild tenderness over the right psoas.  No paraspinous or spinal process tenderness to palpation.  No clubbing / cyanosis. No joint deformity upper and lower extremities. Good ROM, no contractures. Normal muscle tone.  Skin: no rashes, lesions, ulcers. No induration Neurologic: Sensation intact. Strength 5/5 in all 4.  Psychiatric: Normal judgment and insight. Alert and oriented x 3. Normal mood.   EKG: Ordered and pending.  Assessment/Plan Principal Problem:   Abscess in epidural space of lumbar spine Active Problems:   Septic arthritis of lumbar spine (HCC)   Hyperlipidemia    HTN (hypertension)   Myositis of multiple muscles   Normocytic anemia   Elevated LFTs   Dan Sanchez is a 67 y.o. male with medical history significant for HTN, HLD who is admitted with L3-S1 epidural abscess and L4-5 septic arthritis.  Neurosurgery consulted and planning for surgical intervention 10/2.  Assessment and Plan: L3-S1 dorsal epidural abscess L4-L5 septic arthritis Right psoas and b/l paraspinous muscle myositis with multiple old small abscesses Severe spinal stenosis L3-4, L4-5: Above findings seen on MRI.  Mild fever 100.6 F otherwise hemodynamically stable on admission.  Strength is intact bilateral lower extremities. -Neurosurgery planning for OR 10/2 pm -Keep n.p.o. -Continue IV vancomycin and ceftriaxone -Follow blood cultures -Continue analgesics as needed  Elevated LFTs: Mild elevation of AST, ALT, alk phos on recent outpatient labs, improving on admission.  Suspect secondary to infection.  2 gallstones noted on CT, no biliary dilatation or other significant hepatobiliary findings noted. -Continue to monitor  Hypertension: BP stable.  Normocytic anemia: Stable with hemoglobin 10.6.  Hyperlipidemia: Hold statin for now.   DVT prophylaxis: SCDs Start: 08/29/23 2303 Code Status: Full code, confirmed with patient on admission Family Communication: Discussed with patient, he has discussed with family Disposition Plan: From home, dispo pending clinical progress Consults called: Neurosurgery (Dr. Johnsie Cancel) Severity of Illness: The appropriate patient status for this patient is INPATIENT. Inpatient status is judged to be reasonable and necessary in order to provide the required intensity of service to ensure the patient's safety. The patient's presenting symptoms, physical exam findings, and initial radiographic and laboratory data in the context of their chronic comorbidities is felt to place them at high risk for further clinical deterioration. Furthermore, it  is not anticipated that the patient will be medically stable for discharge from the hospital within 2 midnights of admission.   * I certify that at the point of admission it is my clinical judgment that the patient will require inpatient hospital care spanning beyond 2 midnights from the point of admission due to high intensity of service, high risk for further deterioration and high frequency of surveillance required.Darreld Mclean MD [facility]  If 7PM-7AM, please contact night-coverage [email or web address]  08/29/2023, 11:07 PM

## 2023-08-29 NOTE — Telephone Encounter (Signed)
Ordered referral so that process can be started

## 2023-08-29 NOTE — Telephone Encounter (Signed)
Caller name: ELRIDGE STEMM  On DPR?: Yes  Call back number: [phone] (mobile)  Provider they see: Sheliah Hatch, MD  Reason for call:   Pt is asking a pill for both pain and sleep. Advise. The muscle relaxers he hs aren't working

## 2023-08-29 NOTE — Hospital Course (Signed)
Dan Sanchez is a 67 y.o. male with medical history significant for HTN, HLD who is admitted with L3-S1 epidural abscess and L4-5 septic arthritis.  Neurosurgery consulted and planning for surgical intervention 10/2.

## 2023-08-29 NOTE — Addendum Note (Signed)
Addended by: Sheliah Hatch on: 08/29/2023 07:15 AM   Modules accepted: Orders

## 2023-08-29 NOTE — Telephone Encounter (Signed)
Called Dan Sanchez to advise him to call 911 or go to ER if he has these sx again. Dan Sanchez stated his son is driving him now to have CT. Dan Sanchez states his legs are ok but will call 911 if this happens again.

## 2023-08-29 NOTE — Telephone Encounter (Signed)
Pt is currently at the ER

## 2023-08-29 NOTE — Progress Notes (Signed)
ED Pharmacy Antibiotic Sign Off An antibiotic consult was received from an ED provider for vancomycin per pharmacy dosing for sepsis. A chart review was completed to assess appropriateness.  A single dose of cefepime 2000 mg was placed by the ED provider.   The following one time order(s) were placed per pharmacy consult:  vancomycin 1750 mg x 1 dose  Further antibiotic and/or antibiotic pharmacy consults should be ordered by the admitting provider if indicated.   Thank you for allowing pharmacy to be a part of this patient's care.   Delmar Landau, PharmD, BCPS 08/29/2023 9:52 PM ED Clinical Pharmacist -  [phone]

## 2023-08-29 NOTE — Progress Notes (Signed)
Pharmacy Antibiotic Note  COLEBY YETT is a 67 y.o. male admitted on 08/29/2023 with epidural abscess. MRI evidence of 6mm epidural abscess from L3-S1 and concern for septic arthritis. Vancomycin load given in ED. Pharmacy has been consulted for vancomycin dosing.  Plan: Vancomycin 1250mg  q12h (eAUC 502, Scr 0.8) F/u renal function, microdata and length of therapy Vancomycin levels as needed   Weight: 83 kg (182 lb 15.7 oz)  Temp (24hrs), Avg:98.8 F (37.1 C), Min:97.9 F (36.6 C), Max:100.6 F (38.1 C)  Recent Labs  Lab 08/28/23 1138 08/29/23 1600 08/29/23 2205  WBC 9.8  --  10.4  CREATININE  --  0.76  --     Estimated Creatinine Clearance: 95.1 mL/min (by C-G formula based on SCr of 0.76 mg/dL).    Allergies  Allergen Reactions   Bee Venom Swelling    Antimicrobials this admission: Ceftriaxone 10/1 > Vancomycin 10/1 > Cefepime 10/1 x 1   Microbiology results: 10/1 BCx: pending  Thank you for allowing pharmacy to be a part of this patient's care.  Marja Kays 08/29/2023 11:08 PM

## 2023-08-29 NOTE — ED Provider Notes (Signed)
Care of patient received from prior provider at 4:49 PM, please see their note for complete H/P and care plan.  Received handoff per ED course.    .Critical Care  Performed by: Glyn Ade, MD Authorized by: Glyn Ade, MD   Critical care provider statement:    Critical care time (minutes):  30   Critical care was necessary to treat or prevent imminent or life-threatening deterioration of the following conditions:  Sepsis and CNS failure or compromise   Critical care was time spent personally by me on the following activities:  Development of treatment plan with patient or surrogate, discussions with consultants, evaluation of patient's response to treatment, examination of patient, ordering and review of laboratory studies, ordering and review of radiographic studies, ordering and performing treatments and interventions, pulse oximetry, re-evaluation of patient's condition and review of old charts   Reassessment: MRI demonstrated epidural abscess with septic arthritis of the facets. Uncertain if this is from his recurrent utilization of dry needling and the muscle stimulation from an acupuncturist or a potential alternative source.  Patient does not have clear risk factors for this condition.  He does not have diabetes nor any history of IV drug use.  No other high risk factors.  Consulted neurosurgery stated this would need operative repair.  Started on broad-spectrum antibiotics per their recommendation and admitted to hospitalist for further care and management.  Disposition:   Based on the above findings, I believe this patient is stable for admission.    Patient/family educated about specific findings on our evaluation and explained exact reasons for admission.  Patient/family educated about clinical situation and time was allowed to answer questions.   Admission team communicated with and agreed with need for admission. Patient admitted. Patient ready to move at this time.      Emergency Department Medication Summary:   Medications  vancomycin (VANCOREADY) IVPB 1750 mg/350 mL (1,750 mg Intravenous New Bag/Given 08/29/23 2252)  sodium chloride flush (NS) 0.9 % injection 3 mL (has no administration in time range)  traMADol (ULTRAM) tablet 50 mg (has no administration in time range)  HYDROcodone-acetaminophen (NORCO/VICODIN) 5-325 MG per tablet 1-2 tablet (has no administration in time range)  morphine (PF) 2 MG/ML injection 2 mg (has no administration in time range)  senna-docusate (Senokot-S) tablet 1 tablet (has no administration in time range)  bisacodyl (DULCOLAX) EC tablet 5 mg (has no administration in time range)  ondansetron (ZOFRAN) tablet 4 mg (has no administration in time range)    Or  ondansetron (ZOFRAN) injection 4 mg (has no administration in time range)  oxyCODONE-acetaminophen (PERCOCET/ROXICET) 5-325 MG per tablet 1 tablet (1 tablet Oral Given 08/29/23 1706)  gadobutrol (GADAVIST) 1 MMOL/ML injection 8 mL (8 mLs Intravenous Contrast Given 08/29/23 1904)  ceFEPIme (MAXIPIME) 2 g in sodium chloride 0.9 % 100 mL IVPB (0 g Intravenous Stopped 08/29/23 2252)            Glyn Ade, MD 08/29/23 2304

## 2023-08-29 NOTE — ED Notes (Signed)
Taken to MRI by transporter.

## 2023-08-29 NOTE — ED Notes (Signed)
Patient out of unit in MRI at this time.

## 2023-08-29 NOTE — ED Provider Notes (Signed)
[place] [facility] Provider Note   CSN: [id number] Arrival date & time: 08/29/23  1222     History  Chief Complaint  Patient presents with   Extremity Weakness    Bilateral     Dan Sanchez is a 67 y.o. male with PMHx CAD, GERD, HLD, chronic low back pain secondary to spinal stenosis who presents to ED concerned for BL LE weakness x1 day. Leg weakness caused him to fall in the shower earlier today. Wife at bedside stating that patient can stand up fine, but then gets weak in the legs after minimal exertion which causes him to fall.  Of note, patient states that they had an epidural steroid injection last week that helped relieve his back pain. Patient has had an acute on chronic lower back pain flare x1 month after attempting to lift a lawn mower. Patient also concerned for night sweats and weight loss since back pain flare started. Patient stating that epidural did provide some relief.  Patient denies urinary retention, fecal incontinence, saddle anesthesia, hx of cancer, fever, immunosuppression, IVDU. Denies chest pain, dyspnea, cough, nausea, vomiting, diarrhea, dysuria, hematuria.     Extremity Weakness       Home Medications Prior to Admission medications   Medication Sig Start Date End Date Taking? Authorizing Provider  amLODipine (NORVASC) 10 MG tablet Take 1 tablet (10 mg total) by mouth daily. 12/02/22 12/02/23  Kathleene Hazel, MD  ascorbic acid (VITAMIN C) 1000 MG tablet Take by mouth. 02/01/19   [provider]  aspirin EC 81 MG tablet Take 1 tablet (81 mg total) by mouth daily. Swallow whole. 10/26/20   Kathleene Hazel, MD  celecoxib (CELEBREX) 200 MG capsule Take 1 capsule (200 mg total) by mouth 2 (two) times daily. 08/16/23     cetirizine (ZYRTEC) 10 MG tablet Take by mouth. 02/01/19   [provider]  fexofenadine (ALLEGRA) 180 MG tablet [facility]    [provider]  fluticasone  (FLONASE) 50 MCG/ACT nasal spray Place 2 sprays into both nostrils daily. 06/28/18   Waldon Merl, PA-C  methocarbamol (ROBAXIN) 500 MG tablet Take 1 tablet (500 mg total) by mouth 3 (three) times daily as needed. 08/16/23     methocarbamol (ROBAXIN) 500 MG tablet Take 1 tablet (500 mg total) by mouth 3 (three) times daily as needed for 14 days 08/21/23     Omega-3 Fatty Acids (FISH OIL) 1200 MG CAPS Take 1,200 mg by mouth 2 (two) times daily.    [provider]  omeprazole (PRILOSEC) 20 MG capsule Take 1 capsule (20 mg total) by mouth daily. 04/18/23 04/17/24  Sheliah Hatch, MD  predniSONE (DELTASONE) 10 MG tablet Take 3 tablets by mouth daily x 3 days and then 2 tabs x3 days and then 1 tab x3 days.  Take w/ food. 08/09/23   Sheliah Hatch, MD  rosuvastatin (CRESTOR) 40 MG tablet Take 1 tablet (40 mg total) by mouth daily. 12/02/22   Kathleene Hazel, MD  valACYclovir (VALTREX) 1000 MG tablet  02/01/19   [provider]      Allergies    Bee venom    Review of Systems   Review of Systems  Musculoskeletal:  Positive for extremity weakness.    Physical Exam Updated Vital Signs BP (!) 156/90   Pulse (!) 102   Temp 97.9 F (36.6 C)   Resp 16   Wt 83 kg   SpO2 98%  BMI 26.26 kg/m  Physical Exam Vitals and nursing note reviewed.  Constitutional:      General: He is not in acute distress.    Appearance: He is not ill-appearing or toxic-appearing.  HENT:     Head: Normocephalic and atraumatic.     Mouth/Throat:     Mouth: Mucous membranes are moist.  Eyes:     General: No scleral icterus.       Right eye: No discharge.        Left eye: No discharge.     Conjunctiva/sclera: Conjunctivae normal.  Cardiovascular:     Rate and Rhythm: Normal rate and regular rhythm.     Pulses: Normal pulses.     Heart sounds: Normal heart sounds. No murmur heard. Pulmonary:     Effort: Pulmonary effort is normal. No respiratory distress.     Breath sounds: Normal  breath sounds. No wheezing, rhonchi or rales.  Abdominal:     General: Abdomen is flat. Bowel sounds are normal. There is no distension.     Palpations: Abdomen is soft. There is no mass.     Tenderness: There is no abdominal tenderness.  Musculoskeletal:     Right lower leg: No edema.     Left lower leg: No edema.     Comments: To tenderness to palpation of lumbar spine or paraspinal muscles. No skin changes or swelling over spine.  Skin:    General: Skin is warm and dry.     Findings: No rash.  Neurological:     General: No focal deficit present.     Mental Status: He is alert. Mental status is at baseline.     Comments: GCS 15. Speech is goal oriented. No deficits appreciated to CN III-XII. Patient has equal grip strength bilaterally with 5/5 strength against resistance in all major muscle groups bilaterally. Sensation to light touch intact. Patient moves extremities without ataxia.    Psychiatric:        Mood and Affect: Mood normal.        Behavior: Behavior normal.     ED Results / Procedures / Treatments   Labs (all labs ordered are listed, but only abnormal results are displayed) Labs Reviewed - No data to display  EKG None  Radiology No results found.  Procedures Procedures    Medications Ordered in ED Medications - No data to display  ED Course/ Medical Decision Making/ A&P                                 Medical Decision Making  This patient presents to the ED for concern of weakness, this involves an extensive number of treatment options, and is a complaint that carries with it a high risk of complications and morbidity.  The differential diagnosis includes Ischemic stroke, intracerebral hemorrhage, subarachnoid hemorrhage, Guillain-Barr syndrome, hypoglycemia, electrolyte abnormality, sepsis, ACS, carbon monoxide poisoning, anemia, dehydration.   Co morbidities that complicate the patient evaluation  CAD, GERD, HLD, chronic low back pain secondary to  spinal stenosis   Additional history obtained:  Additional history obtained from PCP notes Patient has been complaining of night sweats, weight loss (13lbs in 20 days), and back pain over the past month. Epidural improved pain, but sweats and fatigue continue. PSA level resulted elevated today (4.11 from previous result of 1.79). patient has been referred to urology ESR also resulted elevated today (111). As were LFT CBC: no leukocytosis; hgb 11.5  HFP: ALP 156; AST 93; ALT 210  Lab Tests:  I Ordered, and personally interpreted labs.  The pertinent results include: - TSH: pending - B12: pending - Mag: pending - CMP: pending  Imaging Studies ordered:  I ordered imaging studies including  -CT chest/abd/pelvis were ordered by PCP. I added on CT L-spine no charge to closer assess the spinal cord. I independently visualized and interpreted imaging  I agree with the radiologist interpretation   Problem List / ED Course / Critical interventions / Medication management  Patient presents to ED concerned with BL LE weakness causing multiple falls today. Also concerned for acute on chronic back pain x1 month ever since trying to lift a lawn mower. Also concerned with night sweats and weight loss that has been happening over the past 1 month. Patient had an epidural steroid injection last week which provided some relief in his back pain. Physical exam and neuro exam unremarkable. Labs were obtained yesterday by PCP.  CBC without leukocytosis.  LFT's were elevated - patient without abdominal pain, nausea, vomiting, or diarrhea. CT showing cholelithiasis without concern for inflammation.  ESR was elevated at 111 and PSA elevated at 4.11.  Obtaining TSH, B12, Mag, and CMP to further assess for metabolic reasons for BL LE weakness. These labs are pending. CT ordered earlier today by PCP without acute chest/abdominal/pelvic abnormalities that would explain patient's symptoms. Given patient's recent  spinal procedure and new onset of leg weakness, I believe it is in patient's best interest to obtain an MRI with and without contrast to further assess for discitis vs epidural hematoma vs other epidural steroid injection abnormalities. Patient will need to be transferred to [facility] for MRI. Secured messaged attendings at [employer] [facility] who agreed to see patient. I appreciate their help. If patient's MRI is negative, he may be appropriate for outpatient follow up.  I have reviewed the patients home medicines and have made adjustments as needed    Social Determinants of Health:  none           Final Clinical Impression(s) / ED Diagnoses Final diagnoses:  None    Rx / DC Orders ED Discharge Orders     None         Margarita Rana 08/29/23 1620    Virgina Norfolk, DO 08/30/23 [phone]

## 2023-08-30 ENCOUNTER — Ambulatory Visit: Payer: Medicare Other | Admitting: Urology

## 2023-08-30 ENCOUNTER — Encounter (HOSPITAL_COMMUNITY): Payer: Self-pay | Admitting: Internal Medicine

## 2023-08-30 ENCOUNTER — Encounter (HOSPITAL_COMMUNITY)
Admission: EM | Disposition: A | Discharge status: Home / Self Care | Payer: Self-pay | Source: Home / Self Care | Attending: Student

## 2023-08-30 ENCOUNTER — Inpatient Hospital Stay (HOSPITAL_COMMUNITY): Payer: Medicare Other

## 2023-08-30 DIAGNOSIS — G061 Intraspinal abscess and granuloma: Secondary | ICD-10-CM

## 2023-08-30 DIAGNOSIS — E7800 Pure hypercholesterolemia, unspecified: Secondary | ICD-10-CM | POA: Diagnosis not present

## 2023-08-30 DIAGNOSIS — M4656 Other infective spondylopathies, lumbar region: Secondary | ICD-10-CM

## 2023-08-30 DIAGNOSIS — R7989 Other specified abnormal findings of blood chemistry: Secondary | ICD-10-CM

## 2023-08-30 DIAGNOSIS — D649 Anemia, unspecified: Secondary | ICD-10-CM

## 2023-08-30 DIAGNOSIS — M609 Myositis, unspecified: Secondary | ICD-10-CM

## 2023-08-30 DIAGNOSIS — E43 Unspecified severe protein-calorie malnutrition: Secondary | ICD-10-CM | POA: Insufficient documentation

## 2023-08-30 HISTORY — PX: LUMBAR LAMINECTOMY FOR EPIDURAL ABSCESS: SHX5956

## 2023-08-30 LAB — QUANTIFERON-TB GOLD PLUS
Mitogen-NIL: 0.35 [IU]/mL
NIL: 0.03 [IU]/mL
QuantiFERON-TB Gold Plus: UNDETERMINED — AB
TB1-NIL: 0 [IU]/mL
TB2-NIL: <0 [IU]/mL

## 2023-08-30 LAB — CBC
HCT: 35.2 % — ABNORMAL LOW (ref 39.0–52.0)
Hemoglobin: 11.7 g/dL — ABNORMAL LOW (ref 13.0–17.0)
MCH: 29.4 pg (ref 26.0–34.0)
MCHC: 33.2 g/dL (ref 30.0–36.0)
MCV: 88.4 fL (ref 80.0–100.0)
Platelets: 377 10*3/uL (ref 150–400)
RBC: 3.98 MIL/uL — ABNORMAL LOW (ref 4.22–5.81)
RDW: 12.5 % (ref 11.5–15.5)
WBC: 10.3 10*3/uL (ref 4.0–10.5)
nRBC: 0 % (ref 0.0–0.2)

## 2023-08-30 LAB — SURGICAL PCR SCREEN
MRSA, PCR: NEGATIVE
Staphylococcus aureus: POSITIVE — AB

## 2023-08-30 LAB — COMPREHENSIVE METABOLIC PANEL
ALT: 116 U/L — ABNORMAL HIGH (ref 0–44)
AST: 24 U/L (ref 15–41)
Albumin: 2.3 g/dL — ABNORMAL LOW (ref 3.5–5.0)
Alkaline Phosphatase: 125 U/L (ref 38–126)
Anion gap: 10 (ref 5–15)
BUN: 12 mg/dL (ref 8–23)
CO2: 21 mmol/L — ABNORMAL LOW (ref 22–32)
Calcium: 8.7 mg/dL — ABNORMAL LOW (ref 8.9–10.3)
Chloride: 103 mmol/L (ref 98–111)
Creatinine, Ser: 0.9 mg/dL (ref 0.61–1.24)
GFR, Estimated: >60 mL/min (ref >60)
Glucose, Bld: 102 mg/dL — ABNORMAL HIGH (ref 70–99)
Potassium: 3.8 mmol/L (ref 3.5–5.1)
Sodium: 134 mmol/L — ABNORMAL LOW (ref 135–145)
Total Bilirubin: 0.7 mg/dL (ref 0.3–1.2)
Total Protein: 6.8 g/dL (ref 6.5–8.1)

## 2023-08-30 LAB — ANA: Anti Nuclear Antibody (ANA): NEGATIVE

## 2023-08-30 LAB — C-REACTIVE PROTEIN: CRP: 23.9 mg/dL — ABNORMAL HIGH (ref <1.0)

## 2023-08-30 LAB — HIV ANTIBODY (ROUTINE TESTING W REFLEX): HIV Screen 4th Generation wRfx: NONREACTIVE

## 2023-08-30 SURGERY — LUMBAR LAMINECTOMY FOR EPIDURAL ABSCESS
Anesthesia: General | Site: Spine Lumbar

## 2023-08-30 MED ORDER — VANCOMYCIN HCL 1250 MG/250ML IV SOLN
1250.0000 mg | Freq: Two times a day (BID) | INTRAVENOUS | Status: DC
  Administered 2023-08-30 (×2): 1250 mg via INTRAVENOUS
  Filled 2023-08-30 (×2): qty 250

## 2023-08-30 MED ORDER — THROMBIN 5000 UNITS EX SOLR
CUTANEOUS | Status: AC
  Filled 2023-08-30: qty 5000

## 2023-08-30 MED ORDER — LIDOCAINE 2% (20 MG/ML) 5 ML SYRINGE
INTRAMUSCULAR | Status: DC | PRN
  Administered 2023-08-30: 60 mg via INTRAVENOUS

## 2023-08-30 MED ORDER — LIDOCAINE-EPINEPHRINE 1 %-1:100000 IJ SOLN
INTRAMUSCULAR | Status: AC
  Filled 2023-08-30: qty 1

## 2023-08-30 MED ORDER — ACETAMINOPHEN 10 MG/ML IV SOLN
INTRAVENOUS | Status: AC
  Filled 2023-08-30: qty 100

## 2023-08-30 MED ORDER — METHYLPREDNISOLONE ACETATE 80 MG/ML IJ SUSP
INTRAMUSCULAR | Status: AC
  Filled 2023-08-30: qty 1

## 2023-08-30 MED ORDER — PROPOFOL 10 MG/ML IV BOLUS
INTRAVENOUS | Status: AC
  Filled 2023-08-30: qty 20

## 2023-08-30 MED ORDER — LIDOCAINE 2% (20 MG/ML) 5 ML SYRINGE
INTRAMUSCULAR | Status: AC
  Filled 2023-08-30: qty 5

## 2023-08-30 MED ORDER — ENSURE ENLIVE PO LIQD
237.0000 mL | Freq: Two times a day (BID) | ORAL | Status: DC
  Administered 2023-08-31: 237 mL via ORAL

## 2023-08-30 MED ORDER — ADULT MULTIVITAMIN W/MINERALS CH
1.0000 | ORAL_TABLET | Freq: Every day | ORAL | Status: DC
  Administered 2023-08-31 – 2023-09-03 (×4): 1 via ORAL
  Filled 2023-08-30 (×4): qty 1

## 2023-08-30 MED ORDER — CHLORHEXIDINE GLUCONATE 0.12 % MT SOLN: 15.0000 mL | Freq: Once | OROMUCOSAL | Status: AC

## 2023-08-30 MED ORDER — LIDOCAINE-EPINEPHRINE 1 %-1:100000 IJ SOLN
INTRAMUSCULAR | Status: DC | PRN
  Administered 2023-08-30: 10 mL

## 2023-08-30 MED ORDER — FENTANYL CITRATE (PF) 250 MCG/5ML IJ SOLN
INTRAMUSCULAR | Status: AC
  Filled 2023-08-30: qty 5

## 2023-08-30 MED ORDER — VITAMIN C 500 MG PO TABS
500.0000 mg | ORAL_TABLET | Freq: Every day | ORAL | Status: DC
  Administered 2023-08-30 – 2023-09-03 (×5): 500 mg via ORAL
  Filled 2023-08-30 (×5): qty 1

## 2023-08-30 MED ORDER — FENTANYL CITRATE (PF) 250 MCG/5ML IJ SOLN
INTRAMUSCULAR | Status: DC | PRN
  Administered 2023-08-30: 50 ug via INTRAVENOUS
  Administered 2023-08-30: 100 ug via INTRAVENOUS

## 2023-08-30 MED ORDER — PROPOFOL 10 MG/ML IV BOLUS
INTRAVENOUS | Status: DC | PRN
  Administered 2023-08-30: 120 mg via INTRAVENOUS

## 2023-08-30 MED ORDER — ROSUVASTATIN CALCIUM 20 MG PO TABS
40.0000 mg | ORAL_TABLET | Freq: Every day | ORAL | Status: DC
  Administered 2023-08-30 – 2023-09-03 (×5): 40 mg via ORAL
  Filled 2023-08-30 (×5): qty 2

## 2023-08-30 MED ORDER — ONDANSETRON HCL 4 MG/2ML IJ SOLN
INTRAMUSCULAR | Status: AC
  Filled 2023-08-30: qty 2

## 2023-08-30 MED ORDER — PHENYLEPHRINE 80 MCG/ML (10ML) SYRINGE FOR IV PUSH (FOR BLOOD PRESSURE SUPPORT)
PREFILLED_SYRINGE | INTRAVENOUS | Status: DC | PRN
  Administered 2023-08-30: 160 ug via INTRAVENOUS
  Administered 2023-08-30: 240 ug via INTRAVENOUS
  Administered 2023-08-30: 160 ug via INTRAVENOUS

## 2023-08-30 MED ORDER — DEXAMETHASONE SODIUM PHOSPHATE 10 MG/ML IJ SOLN
INTRAMUSCULAR | Status: DC | PRN
  Administered 2023-08-30: 10 mg via INTRAVENOUS

## 2023-08-30 MED ORDER — ROCURONIUM BROMIDE 10 MG/ML (PF) SYRINGE
PREFILLED_SYRINGE | INTRAVENOUS | Status: AC
  Filled 2023-08-30: qty 10

## 2023-08-30 MED ORDER — FENTANYL CITRATE (PF) 100 MCG/2ML IJ SOLN: 25.0000 ug | INTRAMUSCULAR | Status: DC | PRN

## 2023-08-30 MED ORDER — OXYCODONE HCL 5 MG/5ML PO SOLN
5.0000 mg | Freq: Once | ORAL | Status: AC | PRN
  Administered 2023-08-30: 5 mg via ORAL

## 2023-08-30 MED ORDER — ORAL CARE MOUTH RINSE: 15.0000 mL | Freq: Once | OROMUCOSAL | Status: AC

## 2023-08-30 MED ORDER — PANTOPRAZOLE SODIUM 40 MG PO TBEC
40.0000 mg | DELAYED_RELEASE_TABLET | Freq: Every day | ORAL | Status: DC
  Administered 2023-08-30 – 2023-09-03 (×5): 40 mg via ORAL
  Filled 2023-08-30 (×5): qty 1

## 2023-08-30 MED ORDER — CHLORHEXIDINE GLUCONATE 0.12 % MT SOLN
OROMUCOSAL | Status: AC
  Administered 2023-08-30: 15 mL via OROMUCOSAL
  Filled 2023-08-30: qty 15

## 2023-08-30 MED ORDER — ROCURONIUM BROMIDE 10 MG/ML (PF) SYRINGE
PREFILLED_SYRINGE | INTRAVENOUS | Status: DC | PRN
  Administered 2023-08-30: 30 mg via INTRAVENOUS
  Administered 2023-08-30: 70 mg via INTRAVENOUS

## 2023-08-30 MED ORDER — LACTATED RINGERS IV SOLN: INTRAVENOUS | Status: DC

## 2023-08-30 MED ORDER — DEXAMETHASONE SODIUM PHOSPHATE 10 MG/ML IJ SOLN
INTRAMUSCULAR | Status: AC
  Filled 2023-08-30: qty 1

## 2023-08-30 MED ORDER — 0.9 % SODIUM CHLORIDE (POUR BTL) OPTIME
TOPICAL | Status: DC | PRN
  Administered 2023-08-30: 1000 mL

## 2023-08-30 MED ORDER — CEFAZOLIN SODIUM-DEXTROSE 2-3 GM-%(50ML) IV SOLR
INTRAVENOUS | Status: DC | PRN
Start: 2023-08-30 — End: 2023-08-30
  Administered 2023-08-30: 2 g via INTRAVENOUS

## 2023-08-30 MED ORDER — ONDANSETRON HCL 4 MG/2ML IJ SOLN: 4.0000 mg | Freq: Once | INTRAMUSCULAR | Status: DC | PRN

## 2023-08-30 MED ORDER — ACETAMINOPHEN 10 MG/ML IV SOLN
1000.0000 mg | Freq: Once | INTRAVENOUS | Status: DC | PRN
  Administered 2023-08-30: 1000 mg via INTRAVENOUS

## 2023-08-30 MED ORDER — MIDAZOLAM HCL 2 MG/2ML IJ SOLN
INTRAMUSCULAR | Status: AC
  Filled 2023-08-30: qty 2

## 2023-08-30 MED ORDER — METRONIDAZOLE 500 MG/100ML IV SOLN
500.0000 mg | Freq: Two times a day (BID) | INTRAVENOUS | Status: DC
  Administered 2023-08-30 – 2023-08-31 (×3): 500 mg via INTRAVENOUS
  Filled 2023-08-30 (×3): qty 100

## 2023-08-30 MED ORDER — OXYCODONE HCL 5 MG PO TABS: 5.0000 mg | ORAL_TABLET | Freq: Once | ORAL | Status: AC | PRN

## 2023-08-30 MED ORDER — THROMBIN 5000 UNITS EX SOLR: OROMUCOSAL | Status: DC | PRN

## 2023-08-30 MED ORDER — AMLODIPINE BESYLATE 10 MG PO TABS
10.0000 mg | ORAL_TABLET | Freq: Every day | ORAL | Status: DC
  Administered 2023-08-30 – 2023-09-03 (×5): 10 mg via ORAL
  Filled 2023-08-30 (×5): qty 1

## 2023-08-30 MED ORDER — OXYCODONE HCL 5 MG/5ML PO SOLN
ORAL | Status: AC
  Filled 2023-08-30: qty 5

## 2023-08-30 MED ORDER — MIDAZOLAM HCL 2 MG/2ML IJ SOLN
INTRAMUSCULAR | Status: DC | PRN
  Administered 2023-08-30: 2 mg via INTRAVENOUS

## 2023-08-30 MED ORDER — SUGAMMADEX SODIUM 200 MG/2ML IV SOLN
INTRAVENOUS | Status: DC | PRN
  Administered 2023-08-30: 160 mg via INTRAVENOUS

## 2023-08-30 SURGICAL SUPPLY — 53 items
ADH SKN CLS APL DERMABOND .7 (GAUZE/BANDAGES/DRESSINGS) ×1
BAG COUNTER SPONGE SURGICOUNT (BAG) ×2 IMPLANT
BAG SPNG CNTER NS LX DISP (BAG) ×1
BLADE CLIPPER SURG (BLADE) IMPLANT
BLADE SURG 11 STRL SS (BLADE) ×2 IMPLANT
BUR MATCHSTICK NEURO 3.0 LAGG (BURR) IMPLANT
BUR PRECISION FLUTE 5.0 (BURR) IMPLANT
CANISTER SUCT 3000ML PPV (MISCELLANEOUS) ×2 IMPLANT
DERMABOND ADVANCED .7 DNX12 (GAUZE/BANDAGES/DRESSINGS) ×2 IMPLANT
DRAPE C-ARM 42X72 X-RAY (DRAPES) ×4 IMPLANT
DRAPE LAPAROTOMY 100X72X124 (DRAPES) ×2 IMPLANT
DRAPE MICROSCOPE SLANT 54X150 (MISCELLANEOUS) ×2 IMPLANT
DRAPE SURG 17X23 STRL (DRAPES) ×2 IMPLANT
DURAPREP 26ML APPLICATOR (WOUND CARE) ×2 IMPLANT
ELECT REM PT RETURN 9FT ADLT (ELECTROSURGICAL) ×1
ELECTRODE REM PT RTRN 9FT ADLT (ELECTROSURGICAL) ×2 IMPLANT
GAUZE 4X4 16PLY ~~LOC~~+RFID DBL (SPONGE) IMPLANT
GAUZE SPONGE 4X4 12PLY STRL (GAUZE/BANDAGES/DRESSINGS) IMPLANT
GLOVE BIO SURGEON STRL SZ7.5 (GLOVE) ×2 IMPLANT
GLOVE BIOGEL PI IND STRL 7.5 (GLOVE) ×4 IMPLANT
GLOVE ECLIPSE 7.5 STRL STRAW (GLOVE) ×2 IMPLANT
GLOVE EXAM NITRILE LRG STRL (GLOVE) IMPLANT
GLOVE EXAM NITRILE XL STR (GLOVE) IMPLANT
GLOVE EXAM NITRILE XS STR PU (GLOVE) IMPLANT
GOWN STRL REUS W/ TWL LRG LVL3 (GOWN DISPOSABLE) ×4 IMPLANT
GOWN STRL REUS W/ TWL XL LVL3 (GOWN DISPOSABLE) IMPLANT
GOWN STRL REUS W/TWL 2XL LVL3 (GOWN DISPOSABLE) IMPLANT
GOWN STRL REUS W/TWL LRG LVL3 (GOWN DISPOSABLE) ×2
GOWN STRL REUS W/TWL XL LVL3 (GOWN DISPOSABLE)
HEMOSTAT POWDER KIT SURGIFOAM (HEMOSTASIS) ×2 IMPLANT
KIT BASIN OR (CUSTOM PROCEDURE TRAY) ×2 IMPLANT
KIT TURNOVER KIT B (KITS) ×2 IMPLANT
NDL HYPO 18GX1.5 BLUNT FILL (NEEDLE) IMPLANT
NDL HYPO 22X1.5 SAFETY MO (MISCELLANEOUS) ×2 IMPLANT
NDL SPNL 18GX3.5 QUINCKE PK (NEEDLE) ×2 IMPLANT
NEEDLE HYPO 18GX1.5 BLUNT FILL (NEEDLE) IMPLANT
NEEDLE HYPO 22X1.5 SAFETY MO (MISCELLANEOUS) ×1 IMPLANT
NEEDLE SPNL 18GX3.5 QUINCKE PK (NEEDLE) ×1 IMPLANT
NS IRRIG 1000ML POUR BTL (IV SOLUTION) ×2 IMPLANT
PACK LAMINECTOMY NEURO (CUSTOM PROCEDURE TRAY) ×2 IMPLANT
PAD ARMBOARD 7.5X6 YLW CONV (MISCELLANEOUS) ×6 IMPLANT
SPIKE FLUID TRANSFER (MISCELLANEOUS) ×2 IMPLANT
SPONGE T-LAP 4X18 ~~LOC~~+RFID (SPONGE) IMPLANT
SUT MNCRL AB 3-0 PS2 18 (SUTURE) ×2 IMPLANT
SUT VIC AB 0 CT1 18XCR BRD8 (SUTURE) ×2 IMPLANT
SUT VIC AB 0 CT1 8-18 (SUTURE) ×1
SUT VIC AB 2-0 CP2 18 (SUTURE) ×2 IMPLANT
SWAB COLLECTION DEVICE MRSA (MISCELLANEOUS) ×2 IMPLANT
SWAB CULTURE ESWAB REG 1ML (MISCELLANEOUS) ×2 IMPLANT
SYR 3ML LL SCALE MARK (SYRINGE) IMPLANT
TOWEL GREEN STERILE (TOWEL DISPOSABLE) ×2 IMPLANT
TOWEL GREEN STERILE FF (TOWEL DISPOSABLE) ×2 IMPLANT
WATER STERILE IRR 1000ML POUR (IV SOLUTION) ×2 IMPLANT

## 2023-08-30 NOTE — Transfer of Care (Signed)
Immediate Anesthesia Transfer of Care Note  Patient: Dan Sanchez  Procedure(s) Performed: LUMBAR LAMINECTOMY LUMBAR THREE-LUMBAR FOUR, LUMBAR FOUR-LUMBAR FIVE WITH EVACUATION OF EPIDURAL ABSCESS (Spine Lumbar)  Patient Location: PACU  Anesthesia Type:General  Level of Consciousness: awake, alert , oriented, and patient cooperative  Airway & Oxygen Therapy: Patient Spontanous Breathing and Patient connected to face mask oxygen  Post-op Assessment: Report given to RN, Post -op Vital signs reviewed and stable, and Patient moving all extremities  Post vital signs: Reviewed and stable  Last Vitals:  Vitals Value Taken Time  BP 137/81 08/30/23 1715  Temp 36.9 C 08/30/23 1715  Pulse 90 08/30/23 1720  Resp 17 08/30/23 1720  SpO2 93 % 08/30/23 1720  Vitals shown include unfiled device data.  Last Pain:  Vitals:   08/30/23 1344  TempSrc:   PainSc: 0-No pain      Patients Stated Pain Goal: 2 (08/30/23 1344)  Complications: No notable events documented.

## 2023-08-30 NOTE — Plan of Care (Signed)
°  Problem: Education: °Goal: Knowledge of General Education information will improve °Description: Including pain rating scale, medication(s)/side effects and non-pharmacologic comfort measures °Outcome: Progressing °  °Problem: Health Behavior/Discharge Planning: °Goal: Ability to manage health-related needs will improve °Outcome: Progressing °  °Problem: Clinical Measurements: °Goal: Cardiovascular complication will be avoided °Outcome: Progressing °  °

## 2023-08-30 NOTE — Anesthesia Preprocedure Evaluation (Signed)
Anesthesia Evaluation  Patient identified by MRN, date of birth, ID band Patient awake    Reviewed: Allergy & Precautions, NPO status , Patient's Chart, lab work & pertinent test results, reviewed documented beta blocker date and time   History of Anesthesia Complications Negative for: history of anesthetic complications  Airway Mallampati: III  TM Distance: >3 FB Neck ROM: Full    Dental no notable dental hx.    Pulmonary neg sleep apnea, neg COPD, neg PE   breath sounds clear to auscultation       Cardiovascular hypertension, + CAD  (-) Past MI, (-) Cardiac Stents, (-) CABG, (-) CHF and (-) DOE (-) dysrhythmias (-) pacemaker Rhythm:Regular Rate:Normal     Neuro/Psych neg Seizures Epidural abscess  Neuromuscular disease    GI/Hepatic ,GERD  ,,(+) neg Cirrhosis        Endo/Other  neg diabetes    Renal/GU Renal disease     Musculoskeletal  (+) Arthritis ,    Abdominal   Peds  Hematology  (+) Blood dyscrasia, anemia   Anesthesia Other Findings   Reproductive/Obstetrics                              Anesthesia Physical Anesthesia Plan  ASA: 2  Anesthesia Plan: General   Post-op Pain Management:    Induction: Intravenous  PONV Risk Score and Plan: 2 and Ondansetron and Dexamethasone  Airway Management Planned: Oral ETT  Additional Equipment:   Intra-op Plan:   Post-operative Plan: Extubation in OR  Informed Consent: I have reviewed the patients History and Physical, chart, labs and discussed the procedure including the risks, benefits and alternatives for the proposed anesthesia with the patient or authorized representative who has indicated his/her understanding and acceptance.     Dental advisory given  Plan Discussed with: CRNA  Anesthesia Plan Comments:          Anesthesia Quick Evaluation

## 2023-08-30 NOTE — Op Note (Signed)
PATIENT: Dan Sanchez  DAY OF SURGERY: 08/30/23   PRE-OPERATIVE DIAGNOSIS:  Lumbar epidural abscess   POST-OPERATIVE DIAGNOSIS:  Same   PROCEDURE:  L3-4, L4-5 open laminectomy and evacuation of epidural abscess   SURGEON:  Surgeon(s) and Role:    Jadene Pierini, MD    Emilee Hero PA   ANESTHESIA: ETGA   BRIEF HISTORY: This is a 67 year old man who presented with worsening back pain more than leg pain for a month. He began having some intermittent leg weakness, prompting him to come to the ED where an MRI showed a lumbar epidural abscess. I therefore recommended laminectomy and evacuation.   OPERATIVE DETAIL: The patient was taken to the operating room, anesthesia was induced by the anesthesia team, and the patient was placed on the OR table in the prone position. A formal time out was performed with two patient identifiers and confirmed the operative site. The operative site was marked, hair was clipped with surgical clippers, the area was then prepped and draped in a sterile fashion. Antibiotics were held until intra-op cultures were taken due to the concern for epidural abscess, then given after samples were taken. A linear incision was placed in the midline to expose L3-4 and L4-5. Subperiosteal dissection was performed bilaterally with immediate encounter of significant purulent material in the paraspinal musculature tracking down into the epidural space. This was cultured x2 and sent to microbiology. Laminectomies were performed at L3-4 and L4-5 with a combination of high speed drill and rongeurs with pockets of purulence and an inflammatory rind including the ligamentum flavum, which was removed with good decompression.   Alli Consentino PA was scrubbed and assisted with the entire procedure which included exposure, laminectomy and closure.  The wound was copiously irrigated, all instrument and sponge counts were correct, and the incision was then closed in layers. The  patient was then returned to anesthesia for emergence. No apparent complications at the completion of the procedure.   EBL:  50mL   DRAINS: none   SPECIMENS: Epidural cultures x2   Jadene Pierini, MD

## 2023-08-30 NOTE — Progress Notes (Signed)
PROGRESS NOTE  Dan [facility] AYT:[id number] DOB: 1956/05/28   PCP: Sheliah Hatch, MD  Patient is from: Home.  Independently ambulates at baseline.  DOA: 08/29/2023 LOS: 1  Chief complaints Chief Complaint  Patient presents with   Extremity Weakness    Bilateral      Brief Narrative / Interim history: 67 year old M with PMH of HTN and HLD presenting with back pain, leg swelling and weakness, night sweat and unintentional weight loss, and admitted with lumbar septic arthritis, dorsal epidural abscesses right psoas and bilateral paraspinous muscle myositis with multiple small abscesses, and severe spinal canal stenosis at L3-4 and L4-5 as noted on MRI lumbar spine.  Blood cultures obtained.  Started on vancomycin and ceftriaxone.  Neurosurgery consulted.  The next day, patient underwent L3-4, L4-5 open laminectomy and evacuation of epidural abscess.  IV Flagyl added.  ID consulted.     Subjective: Seen and examined earlier this morning before he went to surgery.  No major events overnight of this morning.  Denies pain, numbness or tingling.  He was able to walk to the bathroom and back without problem.  He denies bowel or bladder habit change.  Objective: Vitals:   08/30/23 0757 08/30/23 1332 08/30/23 1715 08/30/23 1730  BP: (!) 145/68 (!) 146/72 137/81 (!) 141/84  Pulse: 78 80 80 83  Resp: 18 18 10 18   Temp: 99.5 F (37.5 C) 97.8 F (36.6 C) 98.4 F (36.9 C)   TempSrc: Oral Oral    SpO2: 97% 97% 98% 94%  Weight:  79.4 kg    Height:  5\' 10"  (1.778 m)      Examination:  GENERAL: No apparent distress.  Nontoxic. HEENT: MMM.  Vision and hearing grossly intact.  NECK: Supple.  No apparent JVD.  RESP:  No IWOB.  Fair aeration bilaterally. CVS:  RRR. Heart sounds normal.  ABD/GI/GU: BS+. Abd soft, NTND.  MSK/EXT:  Moves extremities. No apparent deformity. No edema.  SKIN: no apparent skin lesion or wound NEURO: Awake, alert and oriented appropriately.  No apparent  focal neuro deficit. PSYCH: Calm. Normal affect.   Procedures:  10/2-L3-4 and L4-5 open laminectomy and evacuation of epidural abscess by Dr. Maurice Small  Microbiology summarized: 10/1-blood cultures NGTD  Assessment and plan: Principal Problem:   Abscess in epidural space of lumbar spine Active Problems:   Septic arthritis of lumbar spine (HCC)   Hyperlipidemia   HTN (hypertension)   Myositis of multiple muscles   Normocytic anemia   Elevated LFTs   Protein-calorie malnutrition, severe  L3-S1 dorsal epidural abscess L4-L5 septic arthritis Right psoas and b/l paraspinous muscle myositis with multiple old small abscesses Severe spinal stenosis L3-4, L4-5 -S/p L3-4 and L4-5 open laminectomy and evacuation of epidural abscess by Dr. Maurice Small -ID consulted -Continue ceftriaxone and vancomycin -Added Flagyl for anaerobic coverage.  I doubt he needs pseudomonal coverage.   Elevated LFTs: Mild.  Likely due to the above. -Continue to monitor   Hypertension: Normotensive for most part. -Resume home amlodipine   Normocytic anemia: Stable   Hyperlipidemia: -Resume home statin  Severe malnutrition: POA Body mass index is 25.11 kg/m. Nutrition Problem: Severe Malnutrition Etiology: chronic illness (epidural abscess, septic arthritis) Signs/Symptoms: percent weight loss, energy intake < or equal to 75% for > or equal to 1 month Percent weight loss: 11.3 % Interventions: Ensure Enlive (each supplement provides 350kcal and 20 grams of protein), MVI   DVT prophylaxis:  SCDs Start: 08/29/23 2303  Code Status: Full code Family Communication: None  at bedside Level of care: Med-Surg Status is: Inpatient Remains inpatient appropriate because: Septic arthritis, epidural abscess and severe canal stenosis   Final disposition: Likely home once medically stable Consultants:  Neurosurgery Infectious disease  35 minutes with more than 50% spent in reviewing records, counseling  patient/family and coordinating care.   Sch Meds:  Scheduled Meds:  amLODipine  10 mg Oral Daily   ascorbic acid  500 mg Oral Daily   [START ON 08/31/2023] feeding supplement  237 mL Oral BID BM   [START ON 08/31/2023] multivitamin with minerals  1 tablet Oral Daily   pantoprazole  40 mg Oral Daily   rosuvastatin  40 mg Oral Daily   [MAR Hold] sodium chloride flush  3 mL Intravenous Q12H   Continuous Infusions:  acetaminophen     [MAR Hold] cefTRIAXone (ROCEPHIN)  IV 2 g (08/30/23 1028)   [MAR Hold] metronidazole 500 mg (08/30/23 0920)   [MAR Hold] vancomycin 1,250 mg (08/30/23 1123)   PRN Meds:.acetaminophen, [CZY Hold] bisacodyl, fentaNYL (SUBLIMAZE) injection, [MAR Hold] HYDROcodone-acetaminophen, [MAR Hold]  morphine injection, [MAR Hold] ondansetron **OR** [MAR Hold] ondansetron (ZOFRAN) IV, ondansetron (ZOFRAN) IV, oxyCODONE **OR** oxyCODONE, [MAR Hold] senna-docusate, [MAR Hold] traMADol  Antimicrobials: Anti-infectives (From admission, onward)    Start     Dose/Rate Route Frequency Ordered Stop   08/30/23 1000  cefTRIAXone (ROCEPHIN) 2 g in sodium chloride 0.9 % 100 mL IVPB  Status:  Discontinued        2 g 200 mL/hr over 30 Minutes Intravenous Every 12 hours 08/29/23 2304 08/29/23 2314   08/30/23 1000  [MAR Hold]  vancomycin (VANCOREADY) IVPB 1250 mg/250 mL        (MAR Hold since Wed 08/30/2023 at 1323.Hold Reason: Transfer to a Procedural area)   1,250 mg 166.7 mL/hr over 90 Minutes Intravenous Every 12 hours 08/30/23 0046     08/30/23 0800  [MAR Hold]  metroNIDAZOLE (FLAGYL) IVPB 500 mg        (MAR Hold since Wed 08/30/2023 at 1323.Hold Reason: Transfer to a Procedural area)   500 mg 100 mL/hr over 60 Minutes Intravenous Every 12 hours 08/30/23 0748     08/29/23 2315  [MAR Hold]  cefTRIAXone (ROCEPHIN) 2 g in sodium chloride 0.9 % 100 mL IVPB        (MAR Hold since Wed 08/30/2023 at 1323.Hold Reason: Transfer to a Procedural area)   2 g 200 mL/hr over 30 Minutes Intravenous  Every 12 hours 08/29/23 2314     08/29/23 2200  vancomycin (VANCOREADY) IVPB 1750 mg/350 mL        1,750 mg 175 mL/hr over 120 Minutes Intravenous  Once 08/29/23 2152 08/30/23 0054   08/29/23 2145  ceFEPIme (MAXIPIME) 2 g in sodium chloride 0.9 % 100 mL IVPB        2 g 200 mL/hr over 30 Minutes Intravenous  Once 08/29/23 2139 08/29/23 2252        I have personally reviewed the following labs and images: CBC: Recent Labs  Lab 08/28/23 1138 08/29/23 2205 08/30/23 0523  WBC 9.8 10.4 10.3  NEUTROABS 8.2* 8.4*  --   HGB 11.5* 10.6* 11.7*  HCT 34.3* 32.6* 35.2*  MCV 87.2 86.0 88.4  PLT 429.0* 357 377   BMP &GFR Recent Labs  Lab 08/29/23 1600 08/30/23 0523  NA 132* 134*  K 3.7 3.8  CL 98 103  CO2 23 21*  GLUCOSE 110* 102*  BUN 12 12  CREATININE 0.76 0.90  CALCIUM 8.4* 8.7*  MG 2.1  --    Estimated Creatinine Clearance: 84.5 mL/min (by C-G formula based on SCr of 0.9 mg/dL). Liver & Pancreas: Recent Labs  Lab 08/28/23 1004 08/29/23 1600 08/30/23 0523  AST 93* 37 24  ALT 210* 141* 116*  ALKPHOS 156* 134* 125  BILITOT 0.4 0.5 0.7  PROT 7.4 6.8 6.8  ALBUMIN 3.4* 2.5* 2.3*   No results for input(s): "LIPASE", "AMYLASE" in the last 168 hours. No results for input(s): "AMMONIA" in the last 168 hours. Diabetic: No results for input(s): "HGBA1C" in the last 72 hours. No results for input(s): "GLUCAP" in the last 168 hours. Cardiac Enzymes: No results for input(s): "CKTOTAL", "CKMB", "CKMBINDEX", "TROPONINI" in the last 168 hours. No results for input(s): "PROBNP" in the last 8760 hours. Coagulation Profile: No results for input(s): "INR", "PROTIME" in the last 168 hours. Thyroid Function Tests: Recent Labs    08/29/23 1600  TSH 1.131   Lipid Profile: No results for input(s): "CHOL", "HDL", "LDLCALC", "TRIG", "CHOLHDL", "LDLDIRECT" in the last 72 hours. Anemia Panel: Recent Labs    08/29/23 1600  VITAMINB12 413   Urine analysis: No results found for:  "COLORURINE", "APPEARANCEUR", "LABSPEC", "PHURINE", "GLUCOSEU", "HGBUR", "BILIRUBINUR", "KETONESUR", "PROTEINUR", "UROBILINOGEN", "NITRITE", "LEUKOCYTESUR" Sepsis Labs: Invalid input(s): "PROCALCITONIN", "LACTICIDVEN"  Microbiology: Recent Results (from the past 240 hour(s))  Blood culture (routine x 2)     Status: None (Preliminary result)   Collection Time: 08/29/23 10:05 PM   Specimen: BLOOD  Result Value Ref Range Status   Specimen Description BLOOD SITE NOT SPECIFIED  Final   Special Requests   Final    BOTTLES DRAWN AEROBIC AND ANAEROBIC Blood Culture adequate volume   Culture   Final    NO GROWTH < 12 HOURS Performed at [facility], [address] [place], [place] [address]    Report Status PENDING  Incomplete  Blood culture (routine x 2)     Status: None (Preliminary result)   Collection Time: 08/29/23 10:07 PM   Specimen: BLOOD  Result Value Ref Range Status   Specimen Description BLOOD SITE NOT SPECIFIED  Final   Special Requests   Final    BOTTLES DRAWN AEROBIC AND ANAEROBIC Blood Culture adequate volume   Culture   Final    NO GROWTH < 12 HOURS Performed at [facility], [address], [place], [place] [address]    Report Status PENDING  Incomplete  Surgical pcr screen     Status: Abnormal   Collection Time: 08/30/23  7:34 AM   Specimen: Nasal Mucosa; Nasal Swab  Result Value Ref Range Status   MRSA, PCR NEGATIVE NEGATIVE Final   Staphylococcus aureus POSITIVE (A) NEGATIVE Final    Comment: (NOTE) The Xpert SA Assay (FDA approved for NASAL specimens in patients 21 years of age and older), is one component of a comprehensive surveillance program. It is not intended to diagnose infection nor to guide or monitor treatment. Performed at [facility], [address] [place], [place] [address]     Radiology Studies: DG C-Arm 1-60 Min-No Report  Result Date: 08/30/2023 Fluoroscopy was utilized by the requesting physician.  No radiographic  interpretation.   MR Lumbar Spine W Wo Contrast  Result Date: 08/29/2023 CLINICAL DATA:  Mid back pain EXAM: MRI THORACIC AND LUMBAR SPINE WITHOUT AND WITH CONTRAST TECHNIQUE: Multiplanar and multiecho pulse sequences of the thoracic and lumbar spine were obtained without and with intravenous contrast. CONTRAST:  8mL GADAVIST GADOBUTROL 1 MMOL/ML IV  SOLN COMPARISON:  03/18/2021 lumbar spine MRI FINDINGS: MRI THORACIC SPINE FINDINGS Alignment:  Physiologic. Vertebrae: No fracture, evidence of discitis, or bone lesion. Multilevel degenerative loss. No contrast enhancement. Cord:  Normal signal and morphology. Paraspinal and other soft tissues: Negative. Disc levels: T5-6: Small right subarticular disc protrusion without stenosis. T7-8: Small left subarticular disc protrusion without stenosis. The other disc levels are unremarkable. MRI LUMBAR SPINE FINDINGS Segmentation:  Standard. Alignment:  Grade 1 anterolisthesis at L4-5 Vertebrae: Severe facet edema at L4-5. No acute fracture. No discitis. Conus medullaris: Extends to the L1 level and appears normal. There is a dorsal epidural collection at the L3-S1 levels with greatest thickness 6 mm. Paraspinal and other soft tissues: There is edema and abnormal enhancement within the right greater than left paraspinous muscles beginning at the L3 level and extending to the sacrum. There are multiple right paraspinous fluid collections that measure up to 1.1 cm. There is a medial right psoas abscess measuring 6 mm. Disc levels: L1-L2: Mild facet arthrosis. No disc herniation. No spinal canal stenosis. No neural foraminal stenosis. L2-L3: Small disc bulge with mild facet arthrosis. Mild spinal canal stenosis. No neural foraminal stenosis. L3-L4: Intermediate sized disc bulge and mild facet hypertrophy in combination with dorsal epidural abscess. Severe spinal canal stenosis. Moderate bilateral neural foraminal stenosis. L4-L5: Severe facet arthrosis with likely septic  arthritis. Small disc bulge with grade 1 anterolisthesis. Dorsal epidural collection. Severe spinal canal stenosis. Moderate bilateral neural foraminal stenosis. L5-S1: Normal disc. Moderate facet hypertrophy. Dorsal epidural collection. No spinal canal stenosis. No neural foraminal stenosis. Visualized sacrum: Normal. IMPRESSION: 1. Severe facet arthrosis and edema at L4-5, consistent with septic arthritis. 2. Dorsal epidural abscess extending from L3-S1 measuring up to 6 mm in thickness. 3. Right psoas and bilateral paraspinous muscle myositis with multiple old small abscesses. 4. Severe spinal canal stenosis at L3-4 and L4-5. 5. No acute abnormality of the thoracic spine. Mild thoracic degenerative changes. Electronically Signed   By: Deatra Robinson M.D.   On: 08/29/2023 21:19   MR THORACIC SPINE W WO CONTRAST  Result Date: 08/29/2023 CLINICAL DATA:  Mid back pain EXAM: MRI THORACIC AND LUMBAR SPINE WITHOUT AND WITH CONTRAST TECHNIQUE: Multiplanar and multiecho pulse sequences of the thoracic and lumbar spine were obtained without and with intravenous contrast. CONTRAST:  8mL GADAVIST GADOBUTROL 1 MMOL/ML IV SOLN COMPARISON:  03/18/2021 lumbar spine MRI FINDINGS: MRI THORACIC SPINE FINDINGS Alignment:  Physiologic. Vertebrae: No fracture, evidence of discitis, or bone lesion. Multilevel degenerative loss. No contrast enhancement. Cord:  Normal signal and morphology. Paraspinal and other soft tissues: Negative. Disc levels: T5-6: Small right subarticular disc protrusion without stenosis. T7-8: Small left subarticular disc protrusion without stenosis. The other disc levels are unremarkable. MRI LUMBAR SPINE FINDINGS Segmentation:  Standard. Alignment:  Grade 1 anterolisthesis at L4-5 Vertebrae: Severe facet edema at L4-5. No acute fracture. No discitis. Conus medullaris: Extends to the L1 level and appears normal. There is a dorsal epidural collection at the L3-S1 levels with greatest thickness 6 mm. Paraspinal  and other soft tissues: There is edema and abnormal enhancement within the right greater than left paraspinous muscles beginning at the L3 level and extending to the sacrum. There are multiple right paraspinous fluid collections that measure up to 1.1 cm. There is a medial right psoas abscess measuring 6 mm. Disc levels: L1-L2: Mild facet arthrosis. No disc herniation. No spinal canal stenosis. No neural foraminal stenosis. L2-L3: Small disc bulge with mild facet arthrosis. Mild spinal canal  stenosis. No neural foraminal stenosis. L3-L4: Intermediate sized disc bulge and mild facet hypertrophy in combination with dorsal epidural abscess. Severe spinal canal stenosis. Moderate bilateral neural foraminal stenosis. L4-L5: Severe facet arthrosis with likely septic arthritis. Small disc bulge with grade 1 anterolisthesis. Dorsal epidural collection. Severe spinal canal stenosis. Moderate bilateral neural foraminal stenosis. L5-S1: Normal disc. Moderate facet hypertrophy. Dorsal epidural collection. No spinal canal stenosis. No neural foraminal stenosis. Visualized sacrum: Normal. IMPRESSION: 1. Severe facet arthrosis and edema at L4-5, consistent with septic arthritis. 2. Dorsal epidural abscess extending from L3-S1 measuring up to 6 mm in thickness. 3. Right psoas and bilateral paraspinous muscle myositis with multiple old small abscesses. 4. Severe spinal canal stenosis at L3-4 and L4-5. 5. No acute abnormality of the thoracic spine. Mild thoracic degenerative changes. Electronically Signed   By: Deatra Robinson M.D.   On: 08/29/2023 21:19      Kharter Brew T. Tacoya Altizer [facility]  If 7PM-7AM, please contact night-coverage [email or web address] 08/30/2023, 5:44 PM

## 2023-08-30 NOTE — Telephone Encounter (Signed)
Pt has reviewed

## 2023-08-30 NOTE — Anesthesia Procedure Notes (Signed)
Procedure Name: Intubation Date/Time: 08/30/2023 3:15 PM  Performed by: April Holding, CRNAPre-anesthesia Checklist: Patient identified, Emergency Drugs available, Suction available and Patient being monitored Patient Re-evaluated:Patient Re-evaluated prior to induction Oxygen Delivery Method: Circle System Utilized Preoxygenation: Pre-oxygenation with 100% oxygen Induction Type: IV induction Ventilation: Mask ventilation without difficulty Laryngoscope Size: Glidescope and 4 Grade View: Grade III Tube type: Oral Tube size: 7.5 mm Number of attempts: 1 Airway Equipment and Method: Stylet and Oral airway Placement Confirmation: ETT inserted through vocal cords under direct vision, positive ETCO2 and breath sounds checked- equal and bilateral Secured at: 22 cm Tube secured with: Tape Dental Injury: Teeth and Oropharynx as per pre-operative assessment

## 2023-08-30 NOTE — Consult Note (Incomplete)
[facility]    Date of Admission:  08/29/2023      Total days of antibiotics 3  Cefazolin 10/3  Vanc / Cefepime/Flagyl 9/30               Reason for Consult: MSSA bacteremia, Lumbar epidural abscess    Referring Provider: Alanda Sanchez Primary Care Provider: Sheliah Hatch, MD    Assessment: Dan Sanchez is a 67 y.o. male admitted after 1 month of progressively worsened back pain with LE weakness, diaphoresis, found to have vertebral infection w/ cord compression and MSSA bacteremia.   Vertebral Infection, L3-S1 Dorsal Epidural Abscess w/ Cord Compression -  L4-5 Severe Septic Arthritis -  S/P Evacuation 08/30/2023 (Ostergard) -  -MSSA identified in blood with GPC on stain from evacuated epidural material -Narrow to cefazolin    MSSA Bacteremia -  -only risk factor seems to be that he frequently gets steroid injections in back. Not like staph aureus to be subacute however - I wonder if this was introduced with recent injection 9/26.  -Fortunately he only localizes any symptoms to his spine which now following surgery is nearly 100% better. He has no complicating factors of hardware / cardiac devices.  -Agree with transthoracic echo - given smoldering presentation will proceed with TEE - cardiology notified and scheduled Monday 10/7 -Repeat blood cultures today 10/3 - discussed we will need to wait to place PICC line - likely will D/C Monday after 72h of no growth from today's sample.   Home Health Coordination -  -D/W Dan Sanchez and will see if she can touch base with Dan Leriche and his wife regarding home health nursing and medications for IV treatment     Plan: Repeat bcx to ensure clearance  FU TTE (ordered already) --> TEE 10/7 Hold on PICC for now until more reassurance blood is sterile    Principal Problem:   Abscess in epidural space of lumbar spine Active Problems:   Hyperlipidemia   HTN (hypertension)   Septic arthritis of lumbar spine  (HCC)   Myositis of multiple muscles   Normocytic anemia   Elevated LFTs   Protein-calorie malnutrition, severe    [START ON 08/31/2023] feeding supplement  237 mL Oral BID BM   [START ON 08/31/2023] multivitamin with minerals  1 tablet Oral Daily   [MAR Hold] sodium chloride flush  3 mL Intravenous Q12H    HPI: Dan Sanchez is a 67 y.o. male admitted from home with back pain, leg weakness, night sweats and weight loss.   PMHx: HTN, HLD  Recent Hx:  Reported back pain about 1 month ago after he had strain / injury from [facility] Sanchez. Since that time he has had persistent back pain, treating with dry needling and muscle stimulation and epidural steroid injection 9.26.   Night sweats and severe LBP started Saturday 08/05/23 after back injury lifting a lawnmower. He mentions that specifically the night sweats started right away.  Saw Dan Sanchez on 9/18 - tx with muscle relaxers, shot of toradol and PO celebrex  9/26 had epidural steroid injection at Dan Sanchez  Tuesday he buckled and had to crawl out of the shower. Outpatient CT scan with concern over vertebral infection and recommended MRI in ER -   MRI read at Dan Sanchez  1.  Severe facet arthrosis and edema at L4-5, consistent with septic arthritis. 2.  Dorsal epidural abscess extending from L3-S1 measuring up to 6 mm in  thickness. 3.  Right psoas and bilateral paraspinous muscle myositis with multiple old Sanchez abscesses. 4.  Severe spinal canal stenosis at L3-4 and L4-5. 5.  No acute abnormality of the thoracic spine.  Mild thoracic degenerative changes.   Went to OR for L3-4, L4-5 laminectomies with Dan Sanchez and feels 100% better since then.  Fever noted 100.6 here in the hospital, no leukocytosis. Received a dose of Vanc + Cefepime on 10/1  CRP 23.9 In the interim his blood cultures returned positive for MSSA. He is not a diabetic, no drug use history, no notable or memorable cuts, no wounds. No hardware placed  anywhere in body. No allergies to any antibiotics. First time in the hospital. His wife joins him at the bedside to help with history.    Review of Systems: Review of Systems  Constitutional:  Positive for diaphoresis and weight loss.  Respiratory: Negative.    Cardiovascular: Negative.   Gastrointestinal:  Negative for abdominal pain, diarrhea and vomiting.  Genitourinary: Negative.   Musculoskeletal:  Positive for back pain. Negative for joint pain and neck pain.  Skin:  Negative for rash.     Past Medical History:  Diagnosis Date   Allergy    CAD (coronary artery disease)    mild non-obstructive   GERD (gastroesophageal reflux disease)    Hyperlipidemia    S/P right rotator cuff repair    Sinusitis, bacterial    Urinary incontinence     Social History   Tobacco Use   Smoking status: Never   Smokeless tobacco: Never  Vaping Use   Vaping status: Never Used  Substance Use Topics   Alcohol use: Yes    Alcohol/week: 4.0 standard drinks of alcohol    Types: 4 Standard drinks or equivalent per week    Comment: occ   Drug use: No    Family History  Problem Relation Age of Onset   Heart disease Father        MI age 69   Colon cancer Neg Hx    Esophageal cancer Neg Hx    Rectal cancer Neg Hx    Stomach cancer Neg Hx    Colon polyps Neg Hx    Sleep apnea Neg Hx    Allergies  Allergen Reactions   Bee Venom Swelling    OBJECTIVE: Blood pressure (!) 146/72, pulse 80, temperature 97.8 F (36.6 C), temperature source Oral, resp. rate 18, height 5\' 10"  (1.778 m), weight 79.4 kg, SpO2 97%.  Physical Exam Constitutional:      Appearance: Normal appearance. He is not ill-appearing.  HENT:     Head: Normocephalic.     Mouth/Throat:     Mouth: Mucous membranes are moist.     Pharynx: Oropharynx is clear.  Eyes:     General: No scleral icterus. Cardiovascular:     Rate and Rhythm: Normal rate and regular rhythm.  Pulmonary:     Effort: Pulmonary effort is  normal.  Musculoskeletal:        General: Normal range of motion.     Cervical back: Normal range of motion.  Skin:    Coloration: Skin is not jaundiced or pale.  Neurological:     Mental Status: He is alert and oriented to person, place, and time.  Psychiatric:        Mood and Affect: Mood normal.        Judgment: Judgment normal.     Lab Results Lab Results  Component Value Date   WBC 10.3  08/30/2023   HGB 11.7 (L) 08/30/2023   HCT 35.2 (L) 08/30/2023   MCV 88.4 08/30/2023   PLT 377 08/30/2023    Lab Results  Component Value Date   CREATININE 0.90 08/30/2023   BUN 12 08/30/2023   NA 134 (L) 08/30/2023   K 3.8 08/30/2023   CL 103 08/30/2023   CO2 21 (L) 08/30/2023    Lab Results  Component Value Date   ALT 116 (H) 08/30/2023   AST 24 08/30/2023   ALKPHOS 125 08/30/2023   BILITOT 0.7 08/30/2023     Microbiology: Recent Results (from the past 240 hour(s))  Blood culture (routine x 2)     Status: None (Preliminary result)   Collection Time: 08/29/23 10:05 PM   Specimen: BLOOD  Result Value Ref Range Status   Specimen Description BLOOD SITE NOT SPECIFIED  Final   Special Requests   Final    BOTTLES DRAWN AEROBIC AND ANAEROBIC Blood Culture adequate volume   Culture   Final    NO GROWTH < 12 HOURS Performed at [facility], [address] [place], [place] [address]    Report Status PENDING  Incomplete  Blood culture (routine x 2)     Status: None (Preliminary result)   Collection Time: 08/29/23 10:07 PM   Specimen: BLOOD  Result Value Ref Range Status   Specimen Description BLOOD SITE NOT SPECIFIED  Final   Special Requests   Final    BOTTLES DRAWN AEROBIC AND ANAEROBIC Blood Culture adequate volume   Culture   Final    NO GROWTH < 12 HOURS Performed at [facility], [address] [place], [place] [address]    Report Status PENDING  Incomplete     Rexene Alberts, MSN, NP-C [facility]  [email or web address] Pager: [phone] Office: [phone] [facility] Main Line: [phone] *Secure Chat Communication Welcome

## 2023-08-30 NOTE — Consult Note (Incomplete)
[facility]  Total days of antibiotics 2         Reason for Consult:epidural abscess   Referring Physician: gonfa  Principal Problem:   Abscess in epidural space of lumbar spine Active Problems:   Hyperlipidemia   HTN (hypertension)   Septic arthritis of lumbar spine (HCC)   Myositis of multiple muscles   Normocytic anemia   Elevated LFTs   Protein-calorie malnutrition, severe    HPI: Dan Sanchez is a 67 y.o. male hx of HLD, HNT, CAD, who was admitted on   Past Medical History:  Diagnosis Date   Allergy    CAD (coronary artery disease)    mild non-obstructive   GERD (gastroesophageal reflux disease)    Hyperlipidemia    S/P right rotator cuff repair    Sinusitis, bacterial    Urinary incontinence     Allergies:  Allergies  Allergen Reactions   Bee Venom Swelling    Current antibiotics:   MEDICATIONS:  [START ON 08/31/2023] feeding supplement  237 mL Oral BID BM   [START ON 08/31/2023] multivitamin with minerals  1 tablet Oral Daily   [MAR Hold] sodium chloride flush  3 mL Intravenous Q12H    Social History   Tobacco Use   Smoking status: Never   Smokeless tobacco: Never  Vaping Use   Vaping status: Never Used  Substance Use Topics   Alcohol use: Yes    Alcohol/week: 4.0 standard drinks of alcohol    Types: 4 Standard drinks or equivalent per week    Comment: occ   Drug use: No    Family History  Problem Relation Age of Onset   Heart disease Father        MI age 32   Colon cancer Neg Hx    Esophageal cancer Neg Hx    Rectal cancer Neg Hx    Stomach cancer Neg Hx    Colon polyps Neg Hx    Sleep apnea Neg Hx     Review of Systems - {ros master:310782}   OBJECTIVE: Temp:  [97.8 F (36.6 C)-100.6 F (38.1 C)] 97.8 F (36.6 C) (10/02 1332) Pulse Rate:  [75-97] 80 (10/02 1332) Resp:  [15-20] 18 (10/02 1332) BP: (130-158)/(67-81) 146/72 (10/02 1332) SpO2:  [95 %-100 %] 97 % (10/02 1332) Weight:  [79.4 kg]  79.4 kg (10/02 1332) {physical exam:3041130}  LABS: Results for orders placed or performed during the hospital encounter of 08/29/23 (from the past 48 hour(s))  Comprehensive metabolic panel     Status: Abnormal   Collection Time: 08/29/23  4:00 PM  Result Value Ref Range   Sodium 132 (L) 135 - 145 mmol/L   Potassium 3.7 3.5 - 5.1 mmol/L   Chloride 98 98 - 111 mmol/L   CO2 23 22 - 32 mmol/L   Glucose, Bld 110 (H) 70 - 99 mg/dL    Comment: Glucose reference range applies only to samples taken after fasting for at least 8 hours.   BUN 12 8 - 23 mg/dL   Creatinine, Ser 0.86 0.61 - 1.24 mg/dL   Calcium 8.4 (L) 8.9 - 10.3 mg/dL   Total Protein 6.8 6.5 - 8.1 g/dL   Albumin 2.5 (L) 3.5 - 5.0 g/dL   AST 37 15 - 41 U/L   ALT 141 (H) 0 - 44 U/L   Alkaline Phosphatase 134 (H) 38 - 126 U/L   Total Bilirubin 0.5 0.3 - 1.2 mg/dL   GFR, Estimated >57 >84  mL/min    Comment: (NOTE) Calculated using the CKD-EPI Creatinine Equation (2021)    Anion gap 11 5 - 15    Comment: Performed at [facility], [address] [place], [place] [address]  Magnesium     Status: None   Collection Time: 08/29/23  4:00 PM  Result Value Ref Range   Magnesium 2.1 1.7 - 2.4 mg/dL    Comment: Performed at [facility], [address] [place], [place] [address]  Vitamin B12     Status: None   Collection Time: 08/29/23  4:00 PM  Result Value Ref Range   Vitamin B-12 413 180 - 914 pg/mL    Comment: (NOTE) This assay is not validated for testing neonatal or myeloproliferative syndrome specimens for Vitamin B12 levels. Performed at [facility], [address], [place], [place] [address]   TSH     Status: None   Collection Time: 08/29/23  4:00 PM  Result Value Ref Range   TSH 1.131 0.350 - 4.500 uIU/mL    Comment: Performed by a 3rd Generation assay with a functional sensitivity of <=0.01 uIU/mL. Performed at [facility], [address] [place], [place] [address]   Blood  culture (routine x 2)     Status: None (Preliminary result)   Collection Time: 08/29/23 10:05 PM   Specimen: BLOOD  Result Value Ref Range   Specimen Description BLOOD SITE NOT SPECIFIED    Special Requests      BOTTLES DRAWN AEROBIC AND ANAEROBIC Blood Culture adequate volume   Culture      NO GROWTH < 12 HOURS Performed at [facility], [address], [place], [place] [address]    Report Status PENDING   CBC with Differential     Status: Abnormal   Collection Time: 08/29/23 10:05 PM  Result Value Ref Range   WBC 10.4 4.0 - 10.5 K/uL   RBC 3.79 (L) 4.22 - 5.81 MIL/uL   Hemoglobin 10.6 (L) 13.0 - 17.0 g/dL   HCT 69.6 (L) 29.5 - 28.4 %   MCV 86.0 80.0 - 100.0 fL   MCH 28.0 26.0 - 34.0 pg   MCHC 32.5 30.0 - 36.0 g/dL   RDW 13.2 44.0 - 10.2 %   Platelets 357 150 - 400 K/uL   nRBC 0.0 0.0 - 0.2 %   Neutrophils Relative % 81 %   Neutro Abs 8.4 (H) 1.7 - 7.7 K/uL   Lymphocytes Relative 9 %   Lymphs Abs 1.0 0.7 - 4.0 K/uL   Monocytes Relative 9 %   Monocytes Absolute 1.0 0.1 - 1.0 K/uL   Eosinophils Relative 0 %   Eosinophils Absolute 0.0 0.0 - 0.5 K/uL   Basophils Relative 0 %   Basophils Absolute 0.0 0.0 - 0.1 K/uL   Immature Granulocytes 1 %   Abs Immature Granulocytes 0.06 0.00 - 0.07 K/uL    Comment: Performed at [facility], [address] [place], [place] [address]  Sedimentation rate     Status: Abnormal   Collection Time: 08/29/23 10:05 PM  Result Value Ref Range   Sed Rate 130 (H) 0 - 16 mm/hr    Comment: Performed at [facility], [address], [place], [place] [address]  Blood culture (routine x 2)     Status: None (Preliminary result)   Collection Time: 08/29/23 10:07 PM   Specimen: BLOOD  Result Value Ref Range   Specimen  Description BLOOD SITE NOT SPECIFIED    Special Requests      BOTTLES DRAWN AEROBIC AND ANAEROBIC Blood Culture adequate volume   Culture      NO GROWTH < 12 HOURS Performed at [facility], [address], [place], [place] [address]    Report Status PENDING   HIV Antibody (routine testing w rflx)     Status: None   Collection Time: 08/30/23  5:23 AM  Result Value Ref Range   HIV Screen 4th Generation wRfx Non Reactive Non Reactive    Comment: Performed at [facility], [address] [place], [place] [address]  CBC     Status: Abnormal   Collection Time: 08/30/23  5:23 AM  Result Value Ref Range   WBC 10.3 4.0 - 10.5 K/uL   RBC 3.98 (L) 4.22 - 5.81 MIL/uL   Hemoglobin 11.7 (L) 13.0 - 17.0 g/dL   HCT 44.0 (L) 34.7 - 42.5 %   MCV 88.4 80.0 - 100.0 fL   MCH 29.4 26.0 - 34.0 pg   MCHC 33.2 30.0 - 36.0 g/dL   RDW 95.6 38.7 - 56.4 %   Platelets 377 150 - 400 K/uL   nRBC 0.0 0.0 - 0.2 %    Comment: Performed at [facility], [address], [place], [place] [address]  Comprehensive metabolic panel     Status: Abnormal   Collection Time: 08/30/23  5:23 AM  Result Value Ref Range   Sodium 134 (L) 135 - 145 mmol/L   Potassium 3.8 3.5 - 5.1 mmol/L   Chloride 103 98 - 111 mmol/L   CO2 21 (L) 22 - 32 mmol/L   Glucose, Bld 102 (H) 70 - 99 mg/dL    Comment: Glucose reference range applies only to samples taken after fasting for at least 8 hours.   BUN 12 8 - 23 mg/dL   Creatinine, Ser 1.88 0.61 - 1.24 mg/dL   Calcium 8.7 (L) 8.9 - 10.3 mg/dL   Total Protein 6.8 6.5 - 8.1 g/dL   Albumin 2.3 (L) 3.5 - 5.0 g/dL   AST 24 15 - 41 U/L   ALT 116 (H) 0 - 44 U/L   Alkaline Phosphatase 125 38 - 126 U/L   Total Bilirubin 0.7 0.3 - 1.2 mg/dL   GFR, Estimated >41 >66 mL/min    Comment: (NOTE) Calculated using the CKD-EPI Creatinine Equation (2021)    Anion gap 10 5 - 15    Comment: Performed at [facility], [address] [place], [place] [address]  C-reactive protein     Status: Abnormal   Collection Time: 08/30/23  8:02 AM  Result Value Ref Range   CRP 23.9 (H) <1.0 mg/dL    Comment: Performed at [facility], [address], [place], [place] [address]     MICRO:  IMAGING: MR Lumbar Spine W Wo Contrast  Result Date: 08/29/2023 CLINICAL DATA:  Mid back pain EXAM: MRI THORACIC AND LUMBAR SPINE WITHOUT AND WITH CONTRAST TECHNIQUE: Multiplanar and multiecho pulse sequences of the thoracic and lumbar spine were obtained without and with intravenous contrast. CONTRAST:  8mL GADAVIST GADOBUTROL 1 MMOL/ML IV SOLN COMPARISON:  03/18/2021 lumbar spine MRI FINDINGS: MRI THORACIC SPINE FINDINGS Alignment:  Physiologic. Vertebrae: No fracture, evidence of discitis, or bone lesion. Multilevel degenerative loss. No contrast enhancement. Cord:  Normal signal and morphology. Paraspinal and other soft tissues: Negative. Disc levels: T5-6: Small right subarticular disc protrusion without  stenosis. T7-8: Small left subarticular disc protrusion without stenosis. The other disc levels are unremarkable. MRI LUMBAR SPINE FINDINGS Segmentation:  Standard. Alignment:  Grade 1 anterolisthesis at L4-5 Vertebrae: Severe facet edema at L4-5. No acute fracture. No discitis. Conus medullaris: Extends to the L1 level and appears normal. There is a dorsal epidural collection at the L3-S1 levels with greatest thickness 6 mm. Paraspinal and other soft tissues: There is edema and abnormal enhancement within the right greater than left paraspinous muscles beginning at the L3 level and extending to the sacrum. There are multiple right paraspinous fluid collections that measure up to 1.1 cm. There is a medial right psoas abscess measuring 6 mm. Disc levels: L1-L2: Mild facet arthrosis. No disc herniation. No spinal canal stenosis. No neural foraminal stenosis. L2-L3: Small disc bulge with mild facet arthrosis. Mild spinal canal stenosis. No neural foraminal stenosis. L3-L4: Intermediate sized disc bulge and mild facet hypertrophy in combination with dorsal epidural abscess. Severe spinal canal stenosis. Moderate bilateral neural foraminal stenosis. L4-L5: Severe facet arthrosis with likely septic  arthritis. Small disc bulge with grade 1 anterolisthesis. Dorsal epidural collection. Severe spinal canal stenosis. Moderate bilateral neural foraminal stenosis. L5-S1: Normal disc. Moderate facet hypertrophy. Dorsal epidural collection. No spinal canal stenosis. No neural foraminal stenosis. Visualized sacrum: Normal. IMPRESSION: 1. Severe facet arthrosis and edema at L4-5, consistent with septic arthritis. 2. Dorsal epidural abscess extending from L3-S1 measuring up to 6 mm in thickness. 3. Right psoas and bilateral paraspinous muscle myositis with multiple old small abscesses. 4. Severe spinal canal stenosis at L3-4 and L4-5. 5. No acute abnormality of the thoracic spine. Mild thoracic degenerative changes. Electronically Signed   By: Deatra Robinson M.D.   On: 08/29/2023 21:19   MR THORACIC SPINE W WO CONTRAST  Result Date: 08/29/2023 CLINICAL DATA:  Mid back pain EXAM: MRI THORACIC AND LUMBAR SPINE WITHOUT AND WITH CONTRAST TECHNIQUE: Multiplanar and multiecho pulse sequences of the thoracic and lumbar spine were obtained without and with intravenous contrast. CONTRAST:  8mL GADAVIST GADOBUTROL 1 MMOL/ML IV SOLN COMPARISON:  03/18/2021 lumbar spine MRI FINDINGS: MRI THORACIC SPINE FINDINGS Alignment:  Physiologic. Vertebrae: No fracture, evidence of discitis, or bone lesion. Multilevel degenerative loss. No contrast enhancement. Cord:  Normal signal and morphology. Paraspinal and other soft tissues: Negative. Disc levels: T5-6: Small right subarticular disc protrusion without stenosis. T7-8: Small left subarticular disc protrusion without stenosis. The other disc levels are unremarkable. MRI LUMBAR SPINE FINDINGS Segmentation:  Standard. Alignment:  Grade 1 anterolisthesis at L4-5 Vertebrae: Severe facet edema at L4-5. No acute fracture. No discitis. Conus medullaris: Extends to the L1 level and appears normal. There is a dorsal epidural collection at the L3-S1 levels with greatest thickness 6 mm. Paraspinal  and other soft tissues: There is edema and abnormal enhancement within the right greater than left paraspinous muscles beginning at the L3 level and extending to the sacrum. There are multiple right paraspinous fluid collections that measure up to 1.1 cm. There is a medial right psoas abscess measuring 6 mm. Disc levels: L1-L2: Mild facet arthrosis. No disc herniation. No spinal canal stenosis. No neural foraminal stenosis. L2-L3: Small disc bulge with mild facet arthrosis. Mild spinal canal stenosis. No neural foraminal stenosis. L3-L4: Intermediate sized disc bulge and mild facet hypertrophy in combination with dorsal epidural abscess. Severe spinal canal stenosis. Moderate bilateral neural foraminal stenosis. L4-L5: Severe facet arthrosis with likely septic arthritis. Small disc bulge with grade 1 anterolisthesis. Dorsal epidural collection. Severe spinal canal stenosis.  Moderate bilateral neural foraminal stenosis. L5-S1: Normal disc. Moderate facet hypertrophy. Dorsal epidural collection. No spinal canal stenosis. No neural foraminal stenosis. Visualized sacrum: Normal. IMPRESSION: 1. Severe facet arthrosis and edema at L4-5, consistent with septic arthritis. 2. Dorsal epidural abscess extending from L3-S1 measuring up to 6 mm in thickness. 3. Right psoas and bilateral paraspinous muscle myositis with multiple old small abscesses. 4. Severe spinal canal stenosis at L3-4 and L4-5. 5. No acute abnormality of the thoracic spine. Mild thoracic degenerative changes. Electronically Signed   By: Deatra Robinson M.D.   On: 08/29/2023 21:19   CT L-SPINE NO CHARGE  Result Date: 08/29/2023 CLINICAL DATA:  Back pain EXAM: CT LUMBAR SPINE WITHOUT CONTRAST TECHNIQUE: Multidetector CT imaging of the lumbar spine was performed without intravenous contrast administration. Multiplanar CT image reconstructions were also generated. RADIATION DOSE REDUCTION: This exam was performed according to the departmental dose-optimization  program which includes automated exposure control, adjustment of the mA and/or kV according to patient size and/or use of iterative reconstruction technique. COMPARISON:  CT abdomen 08/29/2023 FINDINGS: Segmentation: The lowest lumbar type non-rib-bearing vertebra is labeled as L5. Alignment: 3 mm degenerative anterolisthesis at L4-5. Vertebrae: No lumbar spine fracture. Preserved intervertebral disc height. Paraspinal and other soft tissues: No immediate paraspinal findings, please see dedicated CT abdomen report for other findings in the abdomen. Disc levels: T12-L1: Unremarkable L1-2: No impingement.  Mild degenerative facet arthropathy. L2-3: Moderate bilateral foraminal stenosis and mild central narrowing of the thecal sac due to short pedicles, disc bulge, and facet arthropathy. L3-4: Moderate bilateral foraminal stenosis and moderate central narrowing of the thecal sac due to short pedicles, facet arthropathy, and intervertebral spurring. L4-5: Prominent bilateral foraminal stenosis and prominent central narrowing of the thecal sac due to severe degenerative facet arthropathy, disc uncovering, and disc bulge. Suspected left facet joint effusion. L5-S1: Moderate left and mild to moderate right foraminal stenosis due to short pedicles and facet arthropathy. Mild disc bulge. IMPRESSION: 1. Lumbar spondylosis, congenitally short pedicles, and degenerative disc disease causing prominent impingement at L4-5; and moderate impingement at L2-3, L3-4, and L5-S1. 2. 3 mm degenerative anterolisthesis at L4-5. Electronically Signed   By: Gaylyn Rong M.D.   On: 08/29/2023 14:14   CT CHEST ABDOMEN PELVIS W CONTRAST  Result Date: 08/29/2023 CLINICAL DATA:  Weight loss and night sweats. Elevated liver function tests. Back pain. EXAM: CT CHEST, ABDOMEN, AND PELVIS WITH CONTRAST TECHNIQUE: Multidetector CT imaging of the chest, abdomen and pelvis was performed following the standard protocol during bolus  administration of intravenous contrast. RADIATION DOSE REDUCTION: This exam was performed according to the departmental dose-optimization program which includes automated exposure control, adjustment of the mA and/or kV according to patient size and/or use of iterative reconstruction technique. CONTRAST:  OMNIPAQUE IOHEXOL 300 MG/ML  SOLN COMPARISON:  Chest radiograph 08/28/2023 FINDINGS: CT CHEST FINDINGS Cardiovascular: Left anterior descending coronary artery atheromatous vascular calcification. Mediastinum/Nodes: Unremarkable Lungs/Pleura: Unremarkable Musculoskeletal: Unremarkable CT ABDOMEN PELVIS FINDINGS Hepatobiliary: Two gallstones are present, each about 1.9 cm in long axis. No biliary dilatation. Otherwise unremarkable. Pancreas: Unremarkable Spleen: Unremarkable Adrenals/Urinary Tract: Unremarkable Stomach/Bowel: Sigmoid colon diverticulosis without findings of active diverticulitis. No dilated bowel. Normal appendix. Vascular/Lymphatic: Unremarkable Reproductive: The prostate gland measures 5.0 by 3.5 by 4.6 cm (volume = 42 cm^3). Mild dystrophic calcifications posteriorly along the transition zone. Other: No supplemental non-categorized findings. Musculoskeletal: Lumbar spondylosis and degenerative disc disease with individual levels to be assessed at dedicated lumbar spine CT. Moderate degenerative hip arthropathy bilaterally.  IMPRESSION: 1. No specific cause for the patient's symptoms is identified. 2. Cholelithiasis. 3. Sigmoid colon diverticulosis. 4. Lumbar spondylosis and degenerative disc disease. 5. Moderate degenerative hip arthropathy bilaterally. 6. Left anterior descending coronary artery atheromatous vascular calcification. Electronically Signed   By: Gaylyn Rong M.D.   On: 08/29/2023 14:05    HISTORICAL MICRO/IMAGING  Assessment/Plan:  ***

## 2023-08-30 NOTE — Plan of Care (Signed)
  Problem: Education: Goal: Knowledge of General Education information will improve Description: Including pain rating scale, medication(s)/side effects and non-pharmacologic comfort measures Outcome: Progressing   Problem: Clinical Measurements: Goal: Will remain free from infection Outcome: Progressing   Problem: Activity: Goal: Risk for activity intolerance will decrease Outcome: Progressing   Problem: Nutrition: Goal: Adequate nutrition will be maintained Outcome: Progressing   Problem: Coping: Goal: Level of anxiety will decrease Outcome: Progressing   Problem: Elimination: Goal: Will not experience complications related to bowel motility Outcome: Progressing   Problem: Pain Managment: Goal: General experience of comfort will improve Outcome: Progressing   

## 2023-08-30 NOTE — Progress Notes (Signed)
Brief ID Note -   Dan Sanchez is a 67 y.o. male with epidural abscess of Lspine requiring surgical decompression.   Consult received - he has been getting IV vanc / Ceftriaxone.     Please continue this post op and we can see formally in the morning as he is currently in the OR.     Rexene Alberts, MSN, NP-C [facility]  [name] Pager: [phone] Office: [phone] [facility] Main Line: [phone] *Secure Chat Communication Welcome

## 2023-08-30 NOTE — Plan of Care (Signed)
°  Problem: Clinical Measurements: °Goal: Respiratory complications will improve °Outcome: Progressing °Goal: Cardiovascular complication will be avoided °Outcome: Progressing °  °Problem: Elimination: °Goal: Will not experience complications related to urinary retention °Outcome: Progressing °  °Problem: Safety: °Goal: Ability to remain free from injury will improve °Outcome: Progressing °  °

## 2023-08-30 NOTE — Progress Notes (Signed)
Neurosurgery Service Post-operative progress note  Assessment & Plan: 67 y.o. man s/p 3-4/4-5 lami for epidural abscess.  -intra-op significant purulence in the paraspinals and epidural space with good cultures taken / sent -no issues intra-op, can return to regular floor bed -preop orders restarted with a regular diet -activity as tolerated, no restrictions, no brace needed -okay to restart DVT chemoprophylaxis 10/4  Jadene Pierini  08/30/23 4:47 PM

## 2023-08-30 NOTE — ED Notes (Signed)
ED TO INPATIENT HANDOFF REPORT  ED Nurse Name and Phone #: Juliette Alcide RN [phone]  S Name/Age/Gender Dan Sanchez 67 y.o. male Room/Bed: 038C/038C  Code Status   Code Status: Full Code  Home/SNF/Other Home Patient oriented to: self, place, time, and situation Is this baseline? Yes   Triage Complete: Triage complete  Chief Complaint Abscess in epidural space of lumbar spine [G06.1]  Triage Note Bilateral leg weakness which caused to fall twice today while showering , epidural last week . Was seen today and had CT scan done , presents with left IV access done at CT department , also has MRI scheduled in two days .  Denies incontinence .  Alert and oriented x 4 .    Allergies Allergies  Allergen Reactions   Bee Venom Swelling    Level of Care/Admitting Diagnosis ED Disposition     ED Disposition  Admit   Condition  --   Comment  Hospital Area: [facility] [[id number]]  Level of Care: Telemetry Medical [104]  May admit patient to [facility] or [facility] if equivalent level of care is available:: No  Covid Evaluation: Asymptomatic - no recent exposure (last 10 days) testing not required  Diagnosis: Abscess in epidural space of lumbar spine [phone]  Admitting Physician: Charlsie Quest [[id number]]  Attending Physician: Charlsie Quest [[id number]]  Certification:: I certify this patient will need inpatient services for at least 2 midnights  Expected Medical Readiness: 09/05/2023          B Medical/Surgery History Past Medical History:  Diagnosis Date   Allergy    CAD (coronary artery disease)    mild non-obstructive   GERD (gastroesophageal reflux disease)    Hyperlipidemia    S/P right rotator cuff repair    Sinusitis, bacterial    Urinary incontinence    Past Surgical History:  Procedure Laterality Date   COLONOSCOPY  04/08/2009   LEFT HEART CATHETERIZATION WITH CORONARY ANGIOGRAM N/A 08/13/2014   Procedure: LEFT HEART CATHETERIZATION WITH  CORONARY ANGIOGRAM;  Surgeon: Kathleene Hazel, MD;  Location: [facility] CATH LAB;  Service: Cardiovascular;  Laterality: N/A;   SHOULDER ARTHROSCOPY WITH ROTATOR CUFF REPAIR Right 10/26/2017   Procedure: RIGHT SHOULDER ARTHROSCOPY WITH ROTATOR CUFF REPAIR AND DEBRIDEMENT;  Surgeon: Eugenia Mcalpine, MD;  Location: [facility] ;  Service: Orthopedics;  Laterality: Right;   SHOULDER SURGERY     Right shoulder      A IV Location/Drains/Wounds Patient Lines/Drains/Airways Status     Active Line/Drains/Airways     Name Placement date Placement time Site Days   Peripheral IV 08/29/23 22 G Left Antecubital 08/29/23  --  Antecubital  1   Incision - 3 Ports Other (Comment) Anterior Posterior Medial 10/26/17  1448  -- 2134            Intake/Output Last 24 hours No intake or output data in the 24 hours ending 08/30/23 0002  Labs/Imaging Results for orders placed or performed during the hospital encounter of 08/29/23 (from the past 48 hour(s))  Comprehensive metabolic panel     Status: Abnormal   Collection Time: 08/29/23  4:00 PM  Result Value Ref Range   Sodium 132 (L) 135 - 145 mmol/L   Potassium 3.7 3.5 - 5.1 mmol/L   Chloride 98 98 - 111 mmol/L   CO2 23 22 - 32 mmol/L   Glucose, Bld 110 (H) 70 - 99 mg/dL    Comment: Glucose reference range applies only to samples taken  after fasting for at least 8 hours.   BUN 12 8 - 23 mg/dL   Creatinine, Ser 1.61 0.61 - 1.24 mg/dL   Calcium 8.4 (L) 8.9 - 10.3 mg/dL   Total Protein 6.8 6.5 - 8.1 g/dL   Albumin 2.5 (L) 3.5 - 5.0 g/dL   AST 37 15 - 41 U/L   ALT 141 (H) 0 - 44 U/L   Alkaline Phosphatase 134 (H) 38 - 126 U/L   Total Bilirubin 0.5 0.3 - 1.2 mg/dL   GFR, Estimated >09 >60 mL/min    Comment: (NOTE) Calculated using the CKD-EPI Creatinine Equation (2021)    Anion gap 11 5 - 15    Comment: Performed at [facility], [address] [place], [place] [address]  Magnesium     Status: None   Collection Time:  08/29/23  4:00 PM  Result Value Ref Range   Magnesium 2.1 1.7 - 2.4 mg/dL    Comment: Performed at [facility], [address] [place], [place] [address]  Vitamin B12     Status: None   Collection Time: 08/29/23  4:00 PM  Result Value Ref Range   Vitamin B-12 413 180 - 914 pg/mL    Comment: (NOTE) This assay is not validated for testing neonatal or myeloproliferative syndrome specimens for Vitamin B12 levels. Performed at [facility], [address] [place], [place] [address]   TSH     Status: None   Collection Time: 08/29/23  4:00 PM  Result Value Ref Range   TSH 1.131 0.350 - 4.500 uIU/mL    Comment: Performed by a 3rd Generation assay with a functional sensitivity of <=0.01 uIU/mL. Performed at [facility], [address], [place], [place] [address]   CBC with Differential     Status: Abnormal   Collection Time: 08/29/23 10:05 PM  Result Value Ref Range   WBC 10.4 4.0 - 10.5 K/uL   RBC 3.79 (L) 4.22 - 5.81 MIL/uL   Hemoglobin 10.6 (L) 13.0 - 17.0 g/dL   HCT 08.6 (L) 57.8 - 46.9 %   MCV 86.0 80.0 - 100.0 fL   MCH 28.0 26.0 - 34.0 pg   MCHC 32.5 30.0 - 36.0 g/dL   RDW 62.9 52.8 - 41.3 %   Platelets 357 150 - 400 K/uL   nRBC 0.0 0.0 - 0.2 %   Neutrophils Relative % 81 %   Neutro Abs 8.4 (H) 1.7 - 7.7 K/uL   Lymphocytes Relative 9 %   Lymphs Abs 1.0 0.7 - 4.0 K/uL   Monocytes Relative 9 %   Monocytes Absolute 1.0 0.1 - 1.0 K/uL   Eosinophils Relative 0 %   Eosinophils Absolute 0.0 0.0 - 0.5 K/uL   Basophils Relative 0 %   Basophils Absolute 0.0 0.0 - 0.1 K/uL   Immature Granulocytes 1 %   Abs Immature Granulocytes 0.06 0.00 - 0.07 K/uL    Comment: Performed at [facility], [address], [place], [place] [address]  Sedimentation rate     Status: Abnormal   Collection Time: 08/29/23 10:05 PM  Result Value Ref Range   Sed Rate 130 (H) 0 - 16 mm/hr    Comment: Performed at [facility], [address], [place], [place] [address]    MR Lumbar Spine W Wo Contrast  Result Date: 08/29/2023 CLINICAL DATA:  Mid back pain EXAM: MRI THORACIC AND LUMBAR SPINE WITHOUT AND WITH CONTRAST TECHNIQUE:  Multiplanar and multiecho pulse sequences of the thoracic and lumbar spine were obtained without and with intravenous contrast. CONTRAST:  8mL GADAVIST GADOBUTROL 1 MMOL/ML IV SOLN COMPARISON:  03/18/2021 lumbar spine MRI FINDINGS: MRI THORACIC SPINE FINDINGS Alignment:  Physiologic. Vertebrae: No fracture, evidence of discitis, or bone lesion. Multilevel degenerative loss. No contrast enhancement. Cord:  Normal signal and morphology. Paraspinal and other soft tissues: Negative. Disc levels: T5-6: Small right subarticular disc protrusion without stenosis. T7-8: Small left subarticular disc protrusion without stenosis. The other disc levels are unremarkable. MRI LUMBAR SPINE FINDINGS Segmentation:  Standard. Alignment:  Grade 1 anterolisthesis at L4-5 Vertebrae: Severe facet edema at L4-5. No acute fracture. No discitis. Conus medullaris: Extends to the L1 level and appears normal. There is a dorsal epidural collection at the L3-S1 levels with greatest thickness 6 mm. Paraspinal and other soft tissues: There is edema and abnormal enhancement within the right greater than left paraspinous muscles beginning at the L3 level and extending to the sacrum. There are multiple right paraspinous fluid collections that measure up to 1.1 cm. There is a medial right psoas abscess measuring 6 mm. Disc levels: L1-L2: Mild facet arthrosis. No disc herniation. No spinal canal stenosis. No neural foraminal stenosis. L2-L3: Small disc bulge with mild facet arthrosis. Mild spinal canal stenosis. No neural foraminal stenosis. L3-L4: Intermediate sized disc bulge and mild facet hypertrophy in combination with dorsal epidural abscess. Severe spinal canal stenosis. Moderate bilateral neural foraminal stenosis. L4-L5: Severe facet arthrosis with likely septic arthritis. Small disc  bulge with grade 1 anterolisthesis. Dorsal epidural collection. Severe spinal canal stenosis. Moderate bilateral neural foraminal stenosis. L5-S1: Normal disc. Moderate facet hypertrophy. Dorsal epidural collection. No spinal canal stenosis. No neural foraminal stenosis. Visualized sacrum: Normal. IMPRESSION: 1. Severe facet arthrosis and edema at L4-5, consistent with septic arthritis. 2. Dorsal epidural abscess extending from L3-S1 measuring up to 6 mm in thickness. 3. Right psoas and bilateral paraspinous muscle myositis with multiple old small abscesses. 4. Severe spinal canal stenosis at L3-4 and L4-5. 5. No acute abnormality of the thoracic spine. Mild thoracic degenerative changes. Electronically Signed   By: Deatra Robinson M.D.   On: 08/29/2023 21:19   MR THORACIC SPINE W WO CONTRAST  Result Date: 08/29/2023 CLINICAL DATA:  Mid back pain EXAM: MRI THORACIC AND LUMBAR SPINE WITHOUT AND WITH CONTRAST TECHNIQUE: Multiplanar and multiecho pulse sequences of the thoracic and lumbar spine were obtained without and with intravenous contrast. CONTRAST:  8mL GADAVIST GADOBUTROL 1 MMOL/ML IV SOLN COMPARISON:  03/18/2021 lumbar spine MRI FINDINGS: MRI THORACIC SPINE FINDINGS Alignment:  Physiologic. Vertebrae: No fracture, evidence of discitis, or bone lesion. Multilevel degenerative loss. No contrast enhancement. Cord:  Normal signal and morphology. Paraspinal and other soft tissues: Negative. Disc levels: T5-6: Small right subarticular disc protrusion without stenosis. T7-8: Small left subarticular disc protrusion without stenosis. The other disc levels are unremarkable. MRI LUMBAR SPINE FINDINGS Segmentation:  Standard. Alignment:  Grade 1 anterolisthesis at L4-5 Vertebrae: Severe facet edema at L4-5. No acute fracture. No discitis. Conus medullaris: Extends to the L1 level and appears normal. There is a dorsal epidural collection at the L3-S1 levels with greatest thickness 6 mm. Paraspinal and other soft  tissues: There is edema and abnormal enhancement within the right greater than left paraspinous muscles beginning at the L3 level and extending to the sacrum. There are multiple right paraspinous fluid collections that measure up to 1.1 cm. There is a medial right psoas abscess measuring 6 mm. Disc levels:  L1-L2: Mild facet arthrosis. No disc herniation. No spinal canal stenosis. No neural foraminal stenosis. L2-L3: Small disc bulge with mild facet arthrosis. Mild spinal canal stenosis. No neural foraminal stenosis. L3-L4: Intermediate sized disc bulge and mild facet hypertrophy in combination with dorsal epidural abscess. Severe spinal canal stenosis. Moderate bilateral neural foraminal stenosis. L4-L5: Severe facet arthrosis with likely septic arthritis. Small disc bulge with grade 1 anterolisthesis. Dorsal epidural collection. Severe spinal canal stenosis. Moderate bilateral neural foraminal stenosis. L5-S1: Normal disc. Moderate facet hypertrophy. Dorsal epidural collection. No spinal canal stenosis. No neural foraminal stenosis. Visualized sacrum: Normal. IMPRESSION: 1. Severe facet arthrosis and edema at L4-5, consistent with septic arthritis. 2. Dorsal epidural abscess extending from L3-S1 measuring up to 6 mm in thickness. 3. Right psoas and bilateral paraspinous muscle myositis with multiple old small abscesses. 4. Severe spinal canal stenosis at L3-4 and L4-5. 5. No acute abnormality of the thoracic spine. Mild thoracic degenerative changes. Electronically Signed   By: Deatra Robinson M.D.   On: 08/29/2023 21:19   CT L-SPINE NO CHARGE  Result Date: 08/29/2023 CLINICAL DATA:  Back pain EXAM: CT LUMBAR SPINE WITHOUT CONTRAST TECHNIQUE: Multidetector CT imaging of the lumbar spine was performed without intravenous contrast administration. Multiplanar CT image reconstructions were also generated. RADIATION DOSE REDUCTION: This exam was performed according to the departmental dose-optimization program which  includes automated exposure control, adjustment of the mA and/or kV according to patient size and/or use of iterative reconstruction technique. COMPARISON:  CT abdomen 08/29/2023 FINDINGS: Segmentation: The lowest lumbar type non-rib-bearing vertebra is labeled as L5. Alignment: 3 mm degenerative anterolisthesis at L4-5. Vertebrae: No lumbar spine fracture. Preserved intervertebral disc height. Paraspinal and other soft tissues: No immediate paraspinal findings, please see dedicated CT abdomen report for other findings in the abdomen. Disc levels: T12-L1: Unremarkable L1-2: No impingement.  Mild degenerative facet arthropathy. L2-3: Moderate bilateral foraminal stenosis and mild central narrowing of the thecal sac due to short pedicles, disc bulge, and facet arthropathy. L3-4: Moderate bilateral foraminal stenosis and moderate central narrowing of the thecal sac due to short pedicles, facet arthropathy, and intervertebral spurring. L4-5: Prominent bilateral foraminal stenosis and prominent central narrowing of the thecal sac due to severe degenerative facet arthropathy, disc uncovering, and disc bulge. Suspected left facet joint effusion. L5-S1: Moderate left and mild to moderate right foraminal stenosis due to short pedicles and facet arthropathy. Mild disc bulge. IMPRESSION: 1. Lumbar spondylosis, congenitally short pedicles, and degenerative disc disease causing prominent impingement at L4-5; and moderate impingement at L2-3, L3-4, and L5-S1. 2. 3 mm degenerative anterolisthesis at L4-5. Electronically Signed   By: Gaylyn Rong M.D.   On: 08/29/2023 14:14   DG Chest 2 View  Result Date: 08/29/2023 CLINICAL DATA:  Weight loss and night sweats EXAM: CHEST - 2 VIEW COMPARISON:  07/22/2014 FINDINGS: The heart size and mediastinal contours are within normal limits. Both lungs are clear. The visualized skeletal structures are unremarkable. IMPRESSION: No active cardiopulmonary disease. Electronically Signed    By: Gaylyn Rong M.D.   On: 08/29/2023 14:06   CT CHEST ABDOMEN PELVIS W CONTRAST  Result Date: 08/29/2023 CLINICAL DATA:  Weight loss and night sweats. Elevated liver function tests. Back pain. EXAM: CT CHEST, ABDOMEN, AND PELVIS WITH CONTRAST TECHNIQUE: Multidetector CT imaging of the chest, abdomen and pelvis was performed following the standard protocol during bolus administration of intravenous contrast. RADIATION DOSE REDUCTION: This exam was performed according to the departmental dose-optimization program which includes automated exposure control, adjustment  of the mA and/or kV according to patient size and/or use of iterative reconstruction technique. CONTRAST:  OMNIPAQUE IOHEXOL 300 MG/ML  SOLN COMPARISON:  Chest radiograph 08/28/2023 FINDINGS: CT CHEST FINDINGS Cardiovascular: Left anterior descending coronary artery atheromatous vascular calcification. Mediastinum/Nodes: Unremarkable Lungs/Pleura: Unremarkable Musculoskeletal: Unremarkable CT ABDOMEN PELVIS FINDINGS Hepatobiliary: Two gallstones are present, each about 1.9 cm in long axis. No biliary dilatation. Otherwise unremarkable. Pancreas: Unremarkable Spleen: Unremarkable Adrenals/Urinary Tract: Unremarkable Stomach/Bowel: Sigmoid colon diverticulosis without findings of active diverticulitis. No dilated bowel. Normal appendix. Vascular/Lymphatic: Unremarkable Reproductive: The prostate gland measures 5.0 by 3.5 by 4.6 cm (volume = 42 cm^3). Mild dystrophic calcifications posteriorly along the transition zone. Other: No supplemental non-categorized findings. Musculoskeletal: Lumbar spondylosis and degenerative disc disease with individual levels to be assessed at dedicated lumbar spine CT. Moderate degenerative hip arthropathy bilaterally. IMPRESSION: 1. No specific cause for the patient's symptoms is identified. 2. Cholelithiasis. 3. Sigmoid colon diverticulosis. 4. Lumbar spondylosis and degenerative disc disease. 5. Moderate  degenerative hip arthropathy bilaterally. 6. Left anterior descending coronary artery atheromatous vascular calcification. Electronically Signed   By: Gaylyn Rong M.D.   On: 08/29/2023 14:05    Pending Labs Unresulted Labs (From admission, onward)     Start     Ordered   08/30/23 0500  HIV Antibody (routine testing w rflx)  (HIV Antibody (Routine testing w reflex) panel)  Tomorrow morning,   R        08/29/23 2303   08/30/23 0500  CBC  Tomorrow morning,   R        08/29/23 2303   08/30/23 0500  Comprehensive metabolic panel  Tomorrow morning,   R        08/29/23 2309   08/29/23 2139  Blood culture (routine x 2)  BLOOD CULTURE X 2,   R (with STAT occurrences)      08/29/23 2139            Vitals/Pain Today's Vitals   08/29/23 1946 08/29/23 2218 08/29/23 2219 08/29/23 2353  BP: 131/67 136/67    Pulse: 87 97    Resp: 16 16    Temp: (!) 100.6 F (38.1 C)   100 F (37.8 C)  SpO2: 96% 100%    Weight:      PainSc:   5      Isolation Precautions No active isolations  Medications Medications  vancomycin (VANCOREADY) IVPB 1750 mg/350 mL (1,750 mg Intravenous New Bag/Given 08/29/23 2252)  sodium chloride flush (NS) 0.9 % injection 3 mL (3 mLs Intravenous Not Given 08/29/23 2345)  traMADol (ULTRAM) tablet 50 mg (has no administration in time range)  HYDROcodone-acetaminophen (NORCO/VICODIN) 5-325 MG per tablet 1-2 tablet (has no administration in time range)  morphine (PF) 2 MG/ML injection 2 mg (has no administration in time range)  senna-docusate (Senokot-S) tablet 1 tablet (has no administration in time range)  bisacodyl (DULCOLAX) EC tablet 5 mg (has no administration in time range)  ondansetron (ZOFRAN) tablet 4 mg (has no administration in time range)    Or  ondansetron (ZOFRAN) injection 4 mg (has no administration in time range)  cefTRIAXone (ROCEPHIN) 2 g in sodium chloride 0.9 % 100 mL IVPB (has no administration in time range)  oxyCODONE-acetaminophen  (PERCOCET/ROXICET) 5-325 MG per tablet 1 tablet (1 tablet Oral Given 08/29/23 1706)  gadobutrol (GADAVIST) 1 MMOL/ML injection 8 mL (8 mLs Intravenous Contrast Given 08/29/23 1904)  ceFEPIme (MAXIPIME) 2 g in sodium chloride 0.9 % 100 mL IVPB (0 g Intravenous Stopped  08/29/23 2252)    Mobility walks     Focused Assessments Cardiac Assessment Handoff:    Lab Results  Component Value Date   TROPONINI 0.01 07/24/2014   No results found for: "DDIMER" Does the Patient currently have chest pain? No     R Recommendations: See Admitting Provider Note  Report given to:   Additional Notes:

## 2023-08-30 NOTE — Consult Note (Signed)
Neurosurgery Consultation  Reason for Consult: Lumbar epidural abscess Referring Physician: Allena Katz  CC: Back pain  HPI: This is a 67 y.o. man that presents with about a month of back pain, night sweats. He did have an ESI but it was a few days ago. Has a h/o chronic waxing/waning back pain, but this has felt very different, much worse, no significant radicular component, no IVDU or risk factors for epidural abscess. No new weakness but does feel like his legs give out when he tries to stand, no new numbness, or parasthesias, no recent change in bowel or bladder function. No recent use of anti-platelet or anti-coagulant medications except ASA81.   ROS: A 14 point ROS was performed and is negative except as noted in the HPI.   PMHx:  Past Medical History:  Diagnosis Date   Allergy    CAD (coronary artery disease)    mild non-obstructive   GERD (gastroesophageal reflux disease)    Hyperlipidemia    S/P right rotator cuff repair    Sinusitis, bacterial    Urinary incontinence    FamHx:  Family History  Problem Relation Age of Onset   Heart disease Father        MI age 76   Colon cancer Neg Hx    Esophageal cancer Neg Hx    Rectal cancer Neg Hx    Stomach cancer Neg Hx    Colon polyps Neg Hx    Sleep apnea Neg Hx    SocHx:  reports that he has never smoked. He has never used smokeless tobacco. He reports current alcohol use of about 4.0 standard drinks of alcohol per week. He reports that he does not use drugs.  Exam: Vital signs in last 24 hours: Temp:  [97.9 F (36.6 C)-100.6 F (38.1 C)] 99.5 F (37.5 C) (10/02 0757) Pulse Rate:  [75-102] 78 (10/02 0757) Resp:  [15-20] 18 (10/02 0757) BP: (130-158)/(67-90) 145/68 (10/02 0757) SpO2:  [95 %-100 %] 97 % (10/02 0757) Weight:  [83 kg] 83 kg (10/01 1251) General: Awake, alert, cooperative, lying in bed in NAD Head: Normocephalic and atruamatic HEENT: Neck supple Pulmonary: breathing room air comfortably, no evidence of  increased work of breathing Cardiac: RRR Abdomen: S NT ND Extremities: Warm and well perfused x4 Neuro: AOx3, PERRL, EOMI, FS, speech fluent with normal content Strength 5/5 x4, SILTx4, no hoffman's, no clonus   Assessment and Plan: 67 y.o. man with progressive LBP and night sweats. MRI L-spine personally reviewed, which shows a chronic degen-appearing 4-5 spondy with severe canal stenosis at L3-4/L4-5 with a dorsal epidural collection from L3-S1 c/w epidural abscess.   -OR today for L3-4/L4-5 laminectomies. Discussed his degen spondy - can't treat this in the setting of infection, could progress in the future -please call with any concerns or questions  Jadene Pierini, MD 08/30/23 8:35 AM [facility]

## 2023-08-30 NOTE — Progress Notes (Signed)
Initial Nutrition Assessment  DOCUMENTATION CODES:   Severe malnutrition in context of chronic illness  INTERVENTION:  Once diet resumes following surgery, recommend: Regular diet Ensure Enlive po BID, each supplement provides 350 kcal and 20 grams of protein. MVI with minerals daily  NUTRITION DIAGNOSIS:   Severe Malnutrition related to chronic illness (epidural abscess, septic arthritis) as evidenced by percent weight loss, energy intake < or equal to 75% for > or equal to 1 month.  GOAL:   Patient will meet greater than or equal to 90% of their needs  MONITOR:   PO intake, Supplement acceptance, Diet advancement, Labs, Weight trends  REASON FOR ASSESSMENT:   Malnutrition Screening Tool    ASSESSMENT:   Pt admitted with back pain, leg weakness, night sweats and weight loss d/t L3-S1 epidural abscess and L4-5 septic arthritis PMH significant for HTN, HLD.   Neurosurgery planning for laminectomies today.   Spoke with pt at bedside. He reports that he typically eats 3 times per day. He travels for work so typically eats on the road. Breakfast includes half a bagel with peanut butter, lunch and dinner often vary. Over the last 7 weeks d/t back pain/infection, night sweats and weakness, he has had a significant decrease in intake and appetite. He cannot recall the last time he ate a "good meal." On Saturday his wife made taco bowls which he was unable to consume much. Pt mentions that he has had nothing to eat over the last 4 days. Denies use of protein supplements but had planned to start using them to supplement and is agreeable to receiving them post-operatively.   Pt states that he has lost about 15 lbs in the last 7 weeks. His usual weight is about 199 lbs and was down to 182 lbs at most recent appointment. Today's weight documented to be 79.4 kg. Over the last 4 months (06/24-10/2), pt noted to have had a weight loss of 11.3% which is clinically significant for time frame.    Although pt with one area of mild muscle and fat deficits, the degree of weight loss for time frame is suggestive of a more chronic period of undernutrition.   Medications: IV abx  Labs: sodium 134, ALT 116  NUTRITION - FOCUSED PHYSICAL EXAM:  Flowsheet Row Most Recent Value  Orbital Region No depletion  Upper Arm Region Mild depletion  Thoracic and Lumbar Region No depletion  Buccal Region No depletion  Temple Region No depletion  Clavicle Bone Region No depletion  Clavicle and Acromion Bone Region No depletion  Scapular Bone Region No depletion  Dorsal Hand No depletion  Patellar Region Mild depletion  Anterior Thigh Region Mild depletion  Posterior Calf Region No depletion  Edema (RD Assessment) None  Hair Reviewed  Eyes Reviewed  Mouth Reviewed  Skin Reviewed  Nails Reviewed       Diet Order:   Diet Order             Diet NPO time specified Except for: Sips with Meds, Ice Chips  Diet effective now                   EDUCATION NEEDS:   Education needs have been addressed  Skin:  Skin Assessment: Reviewed RN Assessment  Last BM:  PTA  Height:   Ht Readings from Last 1 Encounters:  08/30/23 5\' 10"  (1.778 m)    Weight:   Wt Readings from Last 1 Encounters:  08/30/23 79.4 kg   BMI:  Body  mass index is 25.11 kg/m.  Estimated Nutritional Needs:   Kcal:  2000-2200  Protein:  105-120g  Fluid:  >/=2L  Drusilla Kanner, RDN, LDN Clinical Nutrition

## 2023-08-31 ENCOUNTER — Ambulatory Visit: Payer: Medicare Other | Admitting: Physical Therapy

## 2023-08-31 ENCOUNTER — Encounter (HOSPITAL_COMMUNITY): Payer: Self-pay | Admitting: Neurological Surgery

## 2023-08-31 ENCOUNTER — Other Ambulatory Visit: Payer: Self-pay

## 2023-08-31 ENCOUNTER — Other Ambulatory Visit (HOSPITAL_COMMUNITY): Payer: Medicare Other

## 2023-08-31 ENCOUNTER — Ambulatory Visit (HOSPITAL_BASED_OUTPATIENT_CLINIC_OR_DEPARTMENT_OTHER): Admission: RE | Admit: 2023-08-31 | Payer: Medicare Other | Source: Ambulatory Visit

## 2023-08-31 DIAGNOSIS — R7881 Bacteremia: Secondary | ICD-10-CM | POA: Diagnosis not present

## 2023-08-31 DIAGNOSIS — R7989 Other specified abnormal findings of blood chemistry: Secondary | ICD-10-CM | POA: Diagnosis not present

## 2023-08-31 DIAGNOSIS — B9561 Methicillin susceptible Staphylococcus aureus infection as the cause of diseases classified elsewhere: Secondary | ICD-10-CM | POA: Diagnosis not present

## 2023-08-31 DIAGNOSIS — G061 Intraspinal abscess and granuloma: Secondary | ICD-10-CM | POA: Diagnosis not present

## 2023-08-31 DIAGNOSIS — E7800 Pure hypercholesterolemia, unspecified: Secondary | ICD-10-CM | POA: Diagnosis not present

## 2023-08-31 DIAGNOSIS — M4656 Other infective spondylopathies, lumbar region: Secondary | ICD-10-CM | POA: Diagnosis not present

## 2023-08-31 LAB — PHOSPHORUS: Phosphorus: 4.5 mg/dL (ref 2.5–4.6)

## 2023-08-31 LAB — COMPREHENSIVE METABOLIC PANEL
ALT: 110 U/L — ABNORMAL HIGH (ref 0–44)
AST: 44 U/L — ABNORMAL HIGH (ref 15–41)
Albumin: 2 g/dL — ABNORMAL LOW (ref 3.5–5.0)
Alkaline Phosphatase: 112 U/L (ref 38–126)
Anion gap: 14 (ref 5–15)
BUN: 14 mg/dL (ref 8–23)
CO2: 21 mmol/L — ABNORMAL LOW (ref 22–32)
Calcium: 8.3 mg/dL — ABNORMAL LOW (ref 8.9–10.3)
Chloride: 100 mmol/L (ref 98–111)
Creatinine, Ser: 0.8 mg/dL (ref 0.61–1.24)
GFR, Estimated: >60 mL/min (ref >60)
Glucose, Bld: 144 mg/dL — ABNORMAL HIGH (ref 70–99)
Potassium: 3.9 mmol/L (ref 3.5–5.1)
Sodium: 135 mmol/L (ref 135–145)
Total Bilirubin: 0.4 mg/dL (ref 0.3–1.2)
Total Protein: 6.4 g/dL — ABNORMAL LOW (ref 6.5–8.1)

## 2023-08-31 LAB — BLOOD CULTURE ID PANEL (REFLEXED) - BCID2

## 2023-08-31 LAB — CBC
HCT: 32.8 % — ABNORMAL LOW (ref 39.0–52.0)
Hemoglobin: 10.8 g/dL — ABNORMAL LOW (ref 13.0–17.0)
MCH: 28.3 pg (ref 26.0–34.0)
MCHC: 32.9 g/dL (ref 30.0–36.0)
MCV: 86.1 fL (ref 80.0–100.0)
Platelets: 384 10*3/uL (ref 150–400)
RBC: 3.81 MIL/uL — ABNORMAL LOW (ref 4.22–5.81)
RDW: 12.4 % (ref 11.5–15.5)
WBC: 9.6 10*3/uL (ref 4.0–10.5)
nRBC: 0 % (ref 0.0–0.2)

## 2023-08-31 LAB — MAGNESIUM: Magnesium: 2.3 mg/dL (ref 1.7–2.4)

## 2023-08-31 MED ORDER — CEFAZOLIN SODIUM-DEXTROSE 2-4 GM/100ML-% IV SOLN
2.0000 g | Freq: Three times a day (TID) | INTRAVENOUS | Status: DC
  Administered 2023-08-31 – 2023-09-04 (×13): 2 g via INTRAVENOUS
  Filled 2023-08-31 (×13): qty 100

## 2023-08-31 NOTE — Progress Notes (Signed)
Neurosurgery Service Progress Note  Subjective: No acute events overnight, feels dramatically better than preop, he's quite pleased   Objective: Vitals:   08/30/23 1812 08/30/23 1952 08/31/23 0429 08/31/23 0739  BP: (P) 136/77 124/71 122/68 131/74  Pulse: (P) 70 77 71 67  Resp: (P) 15 18 18 17   Temp: (P) 98.1 F (36.7 C) 98.2 F (36.8 C) (!) 97.5 F (36.4 C) 98.1 F (36.7 C)  TempSrc:  Oral Oral Oral  SpO2: (P) 98% 96% 98% 97%  Weight:      Height:        Physical Exam: Strength 5/5 x4 and SILTx4  Assessment & Plan: 67 y.o. man s/p lami for evac of epidural abscess, recovering well.  -Bcx w/ staph, intra-op epidural Cx w/ GPCs -okay for DVT chemoPPx from my standpoint 10/4 -activity as tolerated, no restrictions -will place follow up instructions in the discharge tab in Epic   [place] A Aniyah Nobis  08/31/23 9:06 AM

## 2023-08-31 NOTE — TOC Initial Note (Addendum)
Transition of Care [facility]) - Initial/Assessment Note    Patient Details  Name: Dan Sanchez MRN: [id number] Date of Birth: March 08, 1956  Transition of Care [facility]) CM/SW Contact:    Epifanio Lesches, RN Phone Number: 08/31/2023, 12:29 PM  Clinical Narrative:                     -s/p lami for evac of epidural abscess, 10/2 From home with wife. PTA independent with ADL's , no DME usage.  ID following ...  NCM spoke with pt @ bedside regarding potential need for LT IV ABX therapy. Pt agreeable in needed. Wife to assist with care. Pt without provider preference. Referral made with [facility] for IV ABX therapy need pending MD's order and Morrie Sheldon / [facility] for  [facility] services.  TOC team  following and will continue to assist with needs.  10/4 TEE planned for 10/7.   Expected Discharge Plan: Home w Home Health Services Barriers to Discharge: Continued Medical Work up   Patient Goals and CMS Choice     Choice offered to / list presented to : Patient      Expected Discharge Plan and Services   Discharge Planning Services: CM Consult   Living arrangements for the past 2 months: Single Family Home                 DME Arranged:  (IV ABX therapy) DME Agency: Other - Comment ([facility]) Date DME Agency Contacted: 08/31/23 Time DME Agency Contacted: 1223 Representative spoke with at DME Agency: Pam HH Arranged: RN HH Agency: [facility] Date HH Agency Contacted: 08/31/23 Time HH Agency Contacted: 1221 Representative spoke with at [facility] Agency: Morrie Sheldon  Prior Living Arrangements/Services Living arrangements for the past 2 months: Single Family Home Lives with:: Spouse                   Activities of Daily Living   ADL Screening (condition at time of admission) Independently performs ADLs?: Yes (appropriate for developmental age) Is the patient deaf or have difficulty hearing?: No Does the patient have difficulty seeing,  even when wearing glasses/contacts?: No Does the patient have difficulty concentrating, remembering, or making decisions?: No  Permission Sought/Granted                  Emotional Assessment              Admission diagnosis:  Weakness [R53.1] Abscess in epidural space of lumbar spine [G06.1] Patient Active Problem List   Diagnosis Date Noted   Protein-calorie malnutrition, severe 08/30/2023   Abscess in epidural space of lumbar spine 08/29/2023   Septic arthritis of lumbar spine (HCC) 08/29/2023   Myositis of multiple muscles 08/29/2023   Normocytic anemia 08/29/2023   Elevated LFTs 08/29/2023   Osteoarthritis of knee 03/01/2023   Impingement syndrome of right shoulder region 02/27/2023   Coronary artery disease involving native coronary artery of native heart without angina pectoris 12/15/2022   Spinal stenosis of lumbar region with neurogenic claudication 03/23/2021   Lumbar radiculopathy 02/04/2021   HTN (hypertension) 03/29/2019   Overweight (BMI 25.0-29.9) 03/29/2019   Nasal septal deviation 02/01/2019   Nasal crusting 02/01/2019   Nasal turbinate hypertrophy 02/01/2019   S/P right rotator cuff repair 10/26/2017   Contact dermatitis 08/27/2015   HSV-1 (herpes simplex virus 1) infection 08/27/2015   Bee sting allergy 09/10/2014   Chest pain 08/11/2014   Allergic rhinitis 08/10/2014  Allergic conjunctivitis 02/20/2014   Insomnia 02/03/2014   GERD (gastroesophageal reflux disease) 02/03/2014   General medical examination 03/16/2012   Hyperlipidemia 12/24/2007   ERECTILE DYSFUNCTION 12/24/2007   PCP:  Sheliah Hatch, MD Pharmacy:   [facility] [place] [facility] [address] [place] [address] Phone: [phone] Fax: [phone]     Social Determinants of Health (SDOH) Social History: SDOH Screenings   Depression [id number]): Low Risk  (08/09/2023)  Tobacco Use: Low Risk  (08/30/2023)   SDOH  Interventions:     Readmission Risk Interventions     No data to display

## 2023-08-31 NOTE — Anesthesia Postprocedure Evaluation (Signed)
Anesthesia Post Note  Patient: Dan Sanchez  Procedure(s) Performed: LUMBAR LAMINECTOMY LUMBAR THREE-LUMBAR FOUR, LUMBAR FOUR-LUMBAR FIVE WITH EVACUATION OF EPIDURAL ABSCESS (Spine Lumbar)     Patient location during evaluation: PACU Anesthesia Type: General Level of consciousness: awake and alert Pain management: pain level controlled Vital Signs Assessment: post-procedure vital signs reviewed and stable Respiratory status: spontaneous breathing, nonlabored ventilation, respiratory function stable and patient connected to nasal cannula oxygen Cardiovascular status: blood pressure returned to baseline and stable Postop Assessment: no apparent nausea or vomiting Anesthetic complications: no   No notable events documented.  Last Vitals:  Vitals:   08/30/23 1952 08/31/23 0429  BP: 124/71 122/68  Pulse: 77 71  Resp: 18 18  Temp: 36.8 C (!) 36.4 C  SpO2: 96% 98%    Last Pain:  Vitals:   08/31/23 0429  TempSrc: Oral  PainSc:                  [name]

## 2023-08-31 NOTE — Discharge Instructions (Signed)
Neurosurgery Discharge Instructions  No restriction in activities, slowly increase your activity back to normal.   Your incision is closed with dermabond (purple glue). This will naturally fall off over the next 1-2 weeks.   Okay to shower on the day of discharge. Use regular soap and water and try to be gentle when cleaning your incision.   Follow up with Dr. Shylin Keizer in 2 weeks after discharge. If you do not already have a discharge appointment, please call his office at [phone] to schedule a follow up appointment. If you have any concerns or questions, please call the office and let us know.  

## 2023-08-31 NOTE — Plan of Care (Signed)
  Problem: Activity: Goal: Risk for activity intolerance will decrease Outcome: Progressing   Problem: Nutrition: Goal: Adequate nutrition will be maintained Outcome: Progressing   Problem: Clinical Measurements: Goal: Will remain free from infection Outcome: Not Progressing   Problem: Pain Managment: Goal: General experience of comfort will improve Outcome: Not Progressing

## 2023-08-31 NOTE — Progress Notes (Signed)
PROGRESS NOTE  Dan Sanchez Dan Sanchez:[id number] DOB: 08/23/1956   PCP: Sheliah Hatch, MD  Patient is from: Home.  Independently ambulates at baseline.  DOA: 08/29/2023 LOS: 2  Chief complaints Chief Complaint  Patient presents with   Extremity Weakness    Bilateral      Brief Narrative / Interim history: 67 year old M with PMH of HTN and HLD presenting with back pain, leg swelling and weakness, night sweat and unintentional weight loss, and admitted with lumbar septic arthritis, dorsal epidural abscesses right psoas and bilateral paraspinous muscle myositis with multiple small abscesses, and severe spinal canal stenosis at L3-4 and L4-5 as noted on MRI lumbar spine.  Blood cultures obtained.  Started on vancomycin and ceftriaxone.  Neurosurgery consulted.  The next day, patient underwent L3-4, L4-5 open laminectomy and evacuation of epidural abscess. ID consulted.  Blood culture with MSSA.  Antibiotic de-escalated to IV Unasyn.     Subjective: Seen and examined earlier this morning.  No major events overnight of this morning.  No complaints.  He denies pain, weakness, numbness or tingling in his legs.  Denies bowel or bladder habit change.  Patient's wife at bedside.  We have discussed about blood culture.   Objective: Vitals:   08/30/23 1812 08/30/23 1952 08/31/23 0429 08/31/23 0739  BP: (P) 136/77 124/71 122/68 131/74  Pulse: (P) 70 77 71 67  Resp: (P) 15 18 18 17   Temp: (P) 98.1 F (36.7 C) 98.2 F (36.8 C) (!) 97.5 F (36.4 C) 98.1 F (36.7 C)  TempSrc:  Oral Oral Oral  SpO2: (P) 98% 96% 98% 97%  Weight:      Height:        Examination:  GENERAL: No apparent distress.  Nontoxic. HEENT: MMM.  Vision and hearing grossly intact.  NECK: Supple.  No apparent JVD.  RESP:  No IWOB.  Fair aeration bilaterally. CVS:  RRR. Heart sounds normal.  ABD/GI/GU: BS+. Abd soft, NTND.  MSK/EXT:  Moves extremities. No apparent deformity. No edema.  NEURO: Awake, alert and  oriented appropriately.  No apparent focal neuro deficit.  5/5 in all extremities. PSYCH: Calm. Normal affect.   Procedures:  10/2-L3-4 and L4-5 open laminectomy and evacuation of epidural abscess by Dr. Maurice Small  Microbiology summarized: 10/1-blood cultures NGTD  Assessment and plan: Principal Problem:   Abscess in epidural space of lumbar spine Active Problems:   Septic arthritis of lumbar spine (HCC)   Hyperlipidemia   HTN (hypertension)   Myositis of multiple muscles   Normocytic anemia   Elevated LFTs   Protein-calorie malnutrition, severe   MSSA bacteremia  L3-S1 dorsal epidural abscess L4-L5 septic arthritis Right psoas and b/l paraspinous muscle myositis with multiple old small abscesses Severe spinal stenosis L3-4, L4-5 MSSA bacteremia -S/p L3-4 and L4-5 open laminectomy and evacuation of epidural abscess by Dr. Maurice Small -CTX and vancomycin 10/1> Ancef 10/3>> -Repeat blood culture -Echocardiogram -Follow ID recs   Elevated LFTs: Mild.  Likely due to the above.  Improving. -Continue to monitor   Hypertension: Normotensive for most part. -Continue home amlodipine.   Normocytic anemia: Stable   Hyperlipidemia: -Continue home statin.  Severe malnutrition: POA Body mass index is 25.11 kg/m. Nutrition Problem: Severe Malnutrition Etiology: chronic illness (epidural abscess, septic arthritis) Signs/Symptoms: percent weight loss, energy intake < or equal to 75% for > or equal to 1 month Percent weight loss: 11.3 % Interventions: Ensure Enlive (each supplement provides 350kcal and 20 grams of protein), MVI   DVT prophylaxis:  SCDs  Start: 08/29/23 2303  Code Status: Full code Family Communication: None at bedside Level of care: Med-Surg Status is: Inpatient Remains inpatient appropriate because: MSSA bacteremia, septic arthritis, epidural abscess and severe canal stenosis   Final disposition: Likely home once medically stable Consultants:   Neurosurgery Infectious disease  35 minutes with more than 50% spent in reviewing records, counseling patient/family and coordinating care.   Sch Meds:  Scheduled Meds:  amLODipine  10 mg Oral Daily   ascorbic acid  500 mg Oral Daily   feeding supplement  237 mL Oral BID BM   multivitamin with minerals  1 tablet Oral Daily   pantoprazole  40 mg Oral Daily   rosuvastatin  40 mg Oral Daily   sodium chloride flush  3 mL Intravenous Q12H   Continuous Infusions:   ceFAZolin (ANCEF) IV     PRN Meds:.bisacodyl, HYDROcodone-acetaminophen, morphine injection, ondansetron **OR** ondansetron (ZOFRAN) IV, senna-docusate, traMADol  Antimicrobials: Anti-infectives (From admission, onward)    Start     Dose/Rate Route Frequency Ordered Stop   08/31/23 1400  ceFAZolin (ANCEF) IVPB 2g/100 mL premix        2 g 200 mL/hr over 30 Minutes Intravenous Every 8 hours 08/31/23 0935     08/30/23 1000  cefTRIAXone (ROCEPHIN) 2 g in sodium chloride 0.9 % 100 mL IVPB  Status:  Discontinued        2 g 200 mL/hr over 30 Minutes Intravenous Every 12 hours 08/29/23 2304 08/29/23 2314   08/30/23 1000  vancomycin (VANCOREADY) IVPB 1250 mg/250 mL  Status:  Discontinued        1,250 mg 166.7 mL/hr over 90 Minutes Intravenous Every 12 hours 08/30/23 0046 08/31/23 0935   08/30/23 0800  metroNIDAZOLE (FLAGYL) IVPB 500 mg  Status:  Discontinued        500 mg 100 mL/hr over 60 Minutes Intravenous Every 12 hours 08/30/23 0748 08/31/23 0935   08/29/23 2315  cefTRIAXone (ROCEPHIN) 2 g in sodium chloride 0.9 % 100 mL IVPB  Status:  Discontinued        2 g 200 mL/hr over 30 Minutes Intravenous Every 12 hours 08/29/23 2314 08/31/23 0935   08/29/23 2200  vancomycin (VANCOREADY) IVPB 1750 mg/350 mL        1,750 mg 175 mL/hr over 120 Minutes Intravenous  Once 08/29/23 2152 08/30/23 0054   08/29/23 2145  ceFEPIme (MAXIPIME) 2 g in sodium chloride 0.9 % 100 mL IVPB        2 g 200 mL/hr over 30 Minutes Intravenous  Once  08/29/23 2139 08/29/23 2252        I have personally reviewed the following labs and images: CBC: Recent Labs  Lab 08/28/23 1138 08/29/23 2205 08/30/23 0523 08/31/23 0519  WBC 9.8 10.4 10.3 9.6  NEUTROABS 8.2* 8.4*  --   --   HGB 11.5* 10.6* 11.7* 10.8*  HCT 34.3* 32.6* 35.2* 32.8*  MCV 87.2 86.0 88.4 86.1  PLT 429.0* 357 377 384   BMP &GFR Recent Labs  Lab 08/29/23 1600 08/30/23 0523 08/31/23 0519  NA 132* 134* 135  K 3.7 3.8 3.9  CL 98 103 100  CO2 23 21* 21*  GLUCOSE 110* 102* 144*  BUN 12 12 14   CREATININE 0.76 0.90 0.80  CALCIUM 8.4* 8.7* 8.3*  MG 2.1  --  2.3  PHOS  --   --  4.5   Estimated Creatinine Clearance: 95.1 mL/min (by C-G formula based on SCr of 0.8 mg/dL). Liver & Pancreas: Recent  Labs  Lab 08/28/23 1004 08/29/23 1600 08/30/23 0523 08/31/23 0519  AST 93* 37 24 44*  ALT 210* 141* 116* 110*  ALKPHOS 156* 134* 125 112  BILITOT 0.4 0.5 0.7 0.4  PROT 7.4 6.8 6.8 6.4*  ALBUMIN 3.4* 2.5* 2.3* 2.0*   No results for input(s): "LIPASE", "AMYLASE" in the last 168 hours. No results for input(s): "AMMONIA" in the last 168 hours. Diabetic: No results for input(s): "HGBA1C" in the last 72 hours. No results for input(s): "GLUCAP" in the last 168 hours. Cardiac Enzymes: No results for input(s): "CKTOTAL", "CKMB", "CKMBINDEX", "TROPONINI" in the last 168 hours. No results for input(s): "PROBNP" in the last 8760 hours. Coagulation Profile: No results for input(s): "INR", "PROTIME" in the last 168 hours. Thyroid Function Tests: Recent Labs    08/29/23 1600  TSH 1.131   Lipid Profile: No results for input(s): "CHOL", "HDL", "LDLCALC", "TRIG", "CHOLHDL", "LDLDIRECT" in the last 72 hours. Anemia Panel: Recent Labs    08/29/23 1600  VITAMINB12 413   Urine analysis: No results found for: "COLORURINE", "APPEARANCEUR", "LABSPEC", "PHURINE", "GLUCOSEU", "HGBUR", "BILIRUBINUR", "KETONESUR", "PROTEINUR", "UROBILINOGEN", "NITRITE",  "LEUKOCYTESUR" Sepsis Labs: Invalid input(s): "PROCALCITONIN", "LACTICIDVEN"  Microbiology: Recent Results (from the past 240 hour(s))  Blood culture (routine x 2)     Status: None (Preliminary result)   Collection Time: 08/29/23 10:05 PM   Specimen: BLOOD  Result Value Ref Range Status   Specimen Description BLOOD SITE NOT SPECIFIED  Final   Special Requests   Final    BOTTLES DRAWN AEROBIC AND ANAEROBIC Blood Culture adequate volume   Culture   Final    NO GROWTH 2 DAYS Performed at [facility], [address] [place], [place] [address]    Report Status PENDING  Incomplete  Blood culture (routine x 2)     Status: None (Preliminary result)   Collection Time: 08/29/23 10:07 PM   Specimen: BLOOD  Result Value Ref Range Status   Specimen Description BLOOD SITE NOT SPECIFIED  Final   Special Requests   Final    BOTTLES DRAWN AEROBIC AND ANAEROBIC Blood Culture adequate volume   Culture  Setup Time   Final    GRAM POSITIVE COCCI IN CLUSTERS ANAEROBIC BOTTLE ONLY CRITICAL RESULT CALLED TO, READ BACK BY AND VERIFIED WITH: PHARMD J. LEDFORD 08/31/23 @ 0545 BY AB Performed at [facility], [address], [place], [place] [address]    Culture GRAM POSITIVE COCCI  Final   Report Status PENDING  Incomplete  Blood Culture ID Panel (Reflexed)     Status: Abnormal   Collection Time: 08/29/23 10:07 PM  Result Value Ref Range Status   Enterococcus faecalis NOT DETECTED NOT DETECTED Final   Enterococcus Faecium NOT DETECTED NOT DETECTED Final   Listeria monocytogenes NOT DETECTED NOT DETECTED Final   Staphylococcus species DETECTED (A) NOT DETECTED Final    Comment: CRITICAL RESULT CALLED TO, READ BACK BY AND VERIFIED WITH: PHARMD J. LEDFORD 08/31/23 @ 0545 BY AB    Staphylococcus aureus (BCID) DETECTED (A) NOT DETECTED Final    Comment: CRITICAL RESULT CALLED TO, READ BACK BY AND VERIFIED WITH: PHARMD J. LEDFORD 08/31/23 @ 0545 BY AB    Staphylococcus epidermidis NOT  DETECTED NOT DETECTED Final   Staphylococcus lugdunensis NOT DETECTED NOT DETECTED Final   Streptococcus species NOT DETECTED NOT DETECTED Final   Streptococcus agalactiae NOT DETECTED NOT DETECTED Final   Streptococcus pneumoniae NOT DETECTED NOT DETECTED Final   Streptococcus pyogenes NOT DETECTED NOT  DETECTED Final   A.calcoaceticus-baumannii NOT DETECTED NOT DETECTED Final   Bacteroides fragilis NOT DETECTED NOT DETECTED Final   Enterobacterales NOT DETECTED NOT DETECTED Final   Enterobacter cloacae complex NOT DETECTED NOT DETECTED Final   Escherichia coli NOT DETECTED NOT DETECTED Final   Klebsiella aerogenes NOT DETECTED NOT DETECTED Final   Klebsiella oxytoca NOT DETECTED NOT DETECTED Final   Klebsiella pneumoniae NOT DETECTED NOT DETECTED Final   Proteus species NOT DETECTED NOT DETECTED Final   Salmonella species NOT DETECTED NOT DETECTED Final   Serratia marcescens NOT DETECTED NOT DETECTED Final   Haemophilus influenzae NOT DETECTED NOT DETECTED Final   Neisseria meningitidis NOT DETECTED NOT DETECTED Final   Pseudomonas aeruginosa NOT DETECTED NOT DETECTED Final   Stenotrophomonas maltophilia NOT DETECTED NOT DETECTED Final   Candida albicans NOT DETECTED NOT DETECTED Final   Candida auris NOT DETECTED NOT DETECTED Final   Candida glabrata NOT DETECTED NOT DETECTED Final   Candida krusei NOT DETECTED NOT DETECTED Final   Candida parapsilosis NOT DETECTED NOT DETECTED Final   Candida tropicalis NOT DETECTED NOT DETECTED Final   Cryptococcus neoformans/gattii NOT DETECTED NOT DETECTED Final   Meth resistant mecA/C and MREJ NOT DETECTED NOT DETECTED Final    Comment: Performed at [facility], [address] [place], [place] [address]  Surgical pcr screen     Status: Abnormal   Collection Time: 08/30/23  7:34 AM   Specimen: Nasal Mucosa; Nasal Swab  Result Value Ref Range Status   MRSA, PCR NEGATIVE NEGATIVE Final   Staphylococcus aureus POSITIVE (A) NEGATIVE Final     Comment: (NOTE) The Xpert SA Assay (FDA approved for NASAL specimens in patients 22 years of age and older), is one component of a comprehensive surveillance program. It is not intended to diagnose infection nor to guide or monitor treatment. Performed at [facility], [address], [place], [place] [address]   Aerobic/Anaerobic Culture w Gram Stain (surgical/deep wound)     Status: None (Preliminary result)   Collection Time: 08/30/23  4:01 PM   Specimen: Wound; Abscess  Result Value Ref Range Status   Specimen Description ABSCESS  Final   Special Requests EPIDURAL  Final   Gram Stain   Final    FEW WBC PRESENT, PREDOMINANTLY PMN RARE GRAM POSITIVE COCCI    Culture   Final    TOO YOUNG TO READ Performed at [facility], [address], [place], [place] [address]    Report Status PENDING  Incomplete    Radiology Studies: DG Lumbar Spine 2-3 Views  Result Date: 08/30/2023 CLINICAL DATA:  [id number] Surgery, elective [id number] EXAM: LUMBAR SPINE - 2-3 VIEW COMPARISON:  MRI 08/29/2023 FINDINGS: Intraoperative imaging for localization. First cross-table lateral image demonstrates a posterior instrument directed toward the L4 spinous process and L3-4 posterior interspace. IMPRESSION: Negative. Electronically Signed   By: Charlett Nose M.D.   On: 08/30/2023 20:41   DG C-Arm 1-60 Min-No Report  Result Date: 08/30/2023 Fluoroscopy was utilized by the requesting physician.  No radiographic interpretation.      Zekiah Caruth T. Nimsi Males [facility]  If 7PM-7AM, please contact night-coverage [email or web address] 08/31/2023, 1:42 PM

## 2023-08-31 NOTE — Progress Notes (Signed)
PHARMACY - PHYSICIAN COMMUNICATION CRITICAL VALUE ALERT - BLOOD CULTURE IDENTIFICATION (BCID)  Dan Sanchez is an 67 y.o. male who presented to [facility] [place] [facility] on 08/29/2023 with a chief complaint of epidural abscess  Name of physician (or Provider) Contacted: A. Virgel Manifold ([facility])  Current antibiotics: Vancomycin, Ceftriaxone, Flagyl  Changes to prescribed antibiotics recommended:  No changes  ID to see today and make changes  Results for orders placed or performed during the hospital encounter of 08/29/23  Blood Culture ID Panel (Reflexed) (Collected: 08/29/2023 10:07 PM)  Result Value Ref Range   Enterococcus faecalis NOT DETECTED NOT DETECTED   Enterococcus Faecium NOT DETECTED NOT DETECTED   Listeria monocytogenes NOT DETECTED NOT DETECTED   Staphylococcus species DETECTED (A) NOT DETECTED   Staphylococcus aureus (BCID) DETECTED (A) NOT DETECTED   Staphylococcus epidermidis NOT DETECTED NOT DETECTED   Staphylococcus lugdunensis NOT DETECTED NOT DETECTED   Streptococcus species NOT DETECTED NOT DETECTED   Streptococcus agalactiae NOT DETECTED NOT DETECTED   Streptococcus pneumoniae NOT DETECTED NOT DETECTED   Streptococcus pyogenes NOT DETECTED NOT DETECTED   A.calcoaceticus-baumannii NOT DETECTED NOT DETECTED   Bacteroides fragilis NOT DETECTED NOT DETECTED   Enterobacterales NOT DETECTED NOT DETECTED   Enterobacter cloacae complex NOT DETECTED NOT DETECTED   Escherichia coli NOT DETECTED NOT DETECTED   Klebsiella aerogenes NOT DETECTED NOT DETECTED   Klebsiella oxytoca NOT DETECTED NOT DETECTED   Klebsiella pneumoniae NOT DETECTED NOT DETECTED   Proteus species NOT DETECTED NOT DETECTED   Salmonella species NOT DETECTED NOT DETECTED   Serratia marcescens NOT DETECTED NOT DETECTED   Haemophilus influenzae NOT DETECTED NOT DETECTED   Neisseria meningitidis NOT DETECTED NOT DETECTED   Pseudomonas aeruginosa NOT DETECTED NOT DETECTED   Stenotrophomonas maltophilia NOT DETECTED NOT  DETECTED   Candida albicans NOT DETECTED NOT DETECTED   Candida auris NOT DETECTED NOT DETECTED   Candida glabrata NOT DETECTED NOT DETECTED   Candida krusei NOT DETECTED NOT DETECTED   Candida parapsilosis NOT DETECTED NOT DETECTED   Candida tropicalis NOT DETECTED NOT DETECTED   Cryptococcus neoformans/gattii NOT DETECTED NOT DETECTED   Meth resistant mecA/C and MREJ NOT DETECTED NOT DETECTED   Abran Duke, PharmD, BCPS Clinical Pharmacist Phone: [phone]

## 2023-09-01 ENCOUNTER — Inpatient Hospital Stay (HOSPITAL_COMMUNITY): Payer: Medicare Other

## 2023-09-01 DIAGNOSIS — R7881 Bacteremia: Secondary | ICD-10-CM

## 2023-09-01 DIAGNOSIS — R7989 Other specified abnormal findings of blood chemistry: Secondary | ICD-10-CM | POA: Diagnosis not present

## 2023-09-01 DIAGNOSIS — E7800 Pure hypercholesterolemia, unspecified: Secondary | ICD-10-CM | POA: Diagnosis not present

## 2023-09-01 DIAGNOSIS — M4656 Other infective spondylopathies, lumbar region: Secondary | ICD-10-CM | POA: Diagnosis not present

## 2023-09-01 DIAGNOSIS — G061 Intraspinal abscess and granuloma: Secondary | ICD-10-CM | POA: Diagnosis not present

## 2023-09-01 LAB — CBC
HCT: 34.9 % — ABNORMAL LOW (ref 39.0–52.0)
Hemoglobin: 11.2 g/dL — ABNORMAL LOW (ref 13.0–17.0)
MCH: 28.1 pg (ref 26.0–34.0)
MCHC: 32.1 g/dL (ref 30.0–36.0)
MCV: 87.5 fL (ref 80.0–100.0)
Platelets: 473 10*3/uL — ABNORMAL HIGH (ref 150–400)
RBC: 3.99 MIL/uL — ABNORMAL LOW (ref 4.22–5.81)
RDW: 12.6 % (ref 11.5–15.5)
WBC: 10.7 10*3/uL — ABNORMAL HIGH (ref 4.0–10.5)
nRBC: 0 % (ref 0.0–0.2)

## 2023-09-01 LAB — COMPREHENSIVE METABOLIC PANEL
ALT: 96 U/L — ABNORMAL HIGH (ref 0–44)
AST: 31 U/L (ref 15–41)
Albumin: 2.1 g/dL — ABNORMAL LOW (ref 3.5–5.0)
Alkaline Phosphatase: 107 U/L (ref 38–126)
Anion gap: 10 (ref 5–15)
BUN: 16 mg/dL (ref 8–23)
CO2: 25 mmol/L (ref 22–32)
Calcium: 8.2 mg/dL — ABNORMAL LOW (ref 8.9–10.3)
Chloride: 101 mmol/L (ref 98–111)
Creatinine, Ser: 0.89 mg/dL (ref 0.61–1.24)
GFR, Estimated: >60 mL/min (ref >60)
Glucose, Bld: 115 mg/dL — ABNORMAL HIGH (ref 70–99)
Potassium: 3.9 mmol/L (ref 3.5–5.1)
Sodium: 136 mmol/L (ref 135–145)
Total Bilirubin: 0.5 mg/dL (ref 0.3–1.2)
Total Protein: 6.3 g/dL — ABNORMAL LOW (ref 6.5–8.1)

## 2023-09-01 LAB — ECHOCARDIOGRAM COMPLETE
AR max vel: 2.48 cm2
AV Peak grad: 8.8 mm[Hg]
Ao pk vel: 1.48 m/s
Area-P 1/2: 3.72 cm2
Height: 70 in
MV M vel: 4.54 m/s
MV Peak grad: 82.4 mm[Hg]
S' Lateral: 2.35 cm
Weight: 2800 [oz_av]

## 2023-09-01 LAB — MAGNESIUM: Magnesium: 2.2 mg/dL (ref 1.7–2.4)

## 2023-09-01 MED ORDER — POLYETHYLENE GLYCOL 3350 17 G PO PACK
17.0000 g | PACK | Freq: Once | ORAL | Status: AC
  Administered 2023-09-01: 17 g via ORAL
  Filled 2023-09-01: qty 1

## 2023-09-01 MED ORDER — SENNOSIDES-DOCUSATE SODIUM 8.6-50 MG PO TABS: 2.0000 | ORAL_TABLET | Freq: Two times a day (BID) | ORAL | Status: DC | PRN

## 2023-09-01 MED ORDER — POLYETHYLENE GLYCOL 3350 17 G PO PACK: 17.0000 g | PACK | Freq: Two times a day (BID) | ORAL | Status: DC | PRN

## 2023-09-01 MED ORDER — SENNOSIDES-DOCUSATE SODIUM 8.6-50 MG PO TABS
2.0000 | ORAL_TABLET | Freq: Once | ORAL | Status: AC
  Administered 2023-09-01: 2 via ORAL
  Filled 2023-09-01: qty 2

## 2023-09-01 NOTE — Progress Notes (Signed)
   [facility] has been requested to perform a transesophageal echocardiogram on Dan Sanchez for bacteremia.  After careful review of history and examination, the risks and benefits of transesophageal echocardiogram have been explained including risks of esophageal damage, perforation (1:10,000 risk), bleeding, pharyngeal hematoma as well as other potential complications associated with conscious sedation including aspiration, arrhythmia, respiratory failure and death. Alternatives to treatment were discussed, questions were answered. Patient is willing to proceed.   Abagail Kitchens, PA-C  09/01/2023 3:02 PM

## 2023-09-01 NOTE — Progress Notes (Signed)
Mobility Specialist: Progress Note   09/01/23 1551  Mobility  Activity Ambulated independently in hallway  Level of Assistance Independent  Assistive Device None  Distance Ambulated (ft) 600 ft  Activity Response Tolerated well  Mobility Referral Yes  $Mobility charge 1 Mobility  Mobility Specialist Start Time (ACUTE ONLY) 1033  Mobility Specialist Stop Time (ACUTE ONLY) 1046  Mobility Specialist Time Calculation (min) (ACUTE ONLY) 13 min    Received pt in bed having no complaints and agreeable to mobility. Pt was asymptomatic throughout ambulation and returned to room w/o fault. Left in bed w/ call bell in reach and all needs met.   Maurene Capes Mobility Specialist Please contact via SecureChat or Rehab office at [phone]

## 2023-09-01 NOTE — Progress Notes (Signed)
PROGRESS NOTE  Dan Sanchez ZOX:[id number] DOB: 1956-04-13   PCP: Sheliah Hatch, MD  Patient is from: Home.  Independently ambulates at baseline.  DOA: 08/29/2023 LOS: 3  Chief complaints Chief Complaint  Patient presents with   Extremity Weakness    Bilateral      Brief Narrative / Interim history: 67 year old M with PMH of HTN and HLD presenting with back pain, leg swelling and weakness, night sweat and unintentional weight loss, and admitted with lumbar septic arthritis, dorsal epidural abscesses right psoas and bilateral paraspinous muscle myositis with multiple small abscesses, and severe spinal canal stenosis at L3-4 and L4-5 as noted on MRI lumbar spine.  Blood cultures obtained.  Started on vancomycin and ceftriaxone.  Neurosurgery consulted.  The next day, patient underwent L3-4, L4-5 open laminectomy and evacuation of epidural abscess. ID consulted.  Blood culture with MSSA.  Repeat blood culture NGTD.  Antibiotic de-escalated to IV Ancef.  TTE without significant finding.  TEE planned for 10/7.     Subjective: Seen and examined earlier this morning.  No major events overnight of this morning.  Reports constipation.  He says he has not had bowel movement since yesterday.  Denies abdominal pain, nausea or vomiting.  Likes to shower and walk around.  Objective: Vitals:   08/31/23 1700 08/31/23 1958 09/01/23 0613 09/01/23 0728  BP: 123/75 129/79 136/85 (!) 138/90  Pulse: 78 78 87 86  Resp: 18 18    Temp: 98.4 F (36.9 C) 98.2 F (36.8 C) 98.6 F (37 C) 98 F (36.7 C)  TempSrc: Oral Oral Oral Oral  SpO2: 97% 92% 95% 93%  Weight:      Height:        Examination:  GENERAL: No apparent distress.  Nontoxic. HEENT: MMM.  Vision and hearing grossly intact.  NECK: Supple.  No apparent JVD.  RESP:  No IWOB.  Fair aeration bilaterally. CVS:  RRR. Heart sounds normal.  ABD/GI/GU: BS+. Abd soft, NTND.  MSK/EXT:  Moves extremities. No apparent deformity. No edema.   SKIN: Surgical incision DCI. NEURO: Awake, alert and oriented appropriately.  No apparent focal neuro deficit.  5/5 in all extremities. PSYCH: Calm. Normal affect.   Procedures:  10/2-L3-4 and L4-5 open laminectomy and evacuation of epidural abscess by Dr. Maurice Small  Microbiology summarized: 10/1-blood cultures MSSA 10/2-epidural abscess culture with abundant Staph aureus. 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 Maurice Small.  -CTX and vancomycin 10/1> Ancef 10/3>> -Repeat blood culture NGTD. -TTE without significant finding. -TEE planned for 10/7. -ID following   Elevated LFTs: Mild.  Likely due to the above.  Improving. -Continue to monitor   Hypertension: Normotensive for most part. -Continue home amlodipine.   Normocytic anemia: Stable   Hyperlipidemia: -Continue home statin.  Constipation -Bowel regimen  Severe malnutrition: POA Body mass index is 25.11 kg/m. Nutrition Problem: Severe Malnutrition Etiology: chronic illness (epidural abscess, septic arthritis) Signs/Symptoms: percent weight loss, energy intake < or equal to 75%  for > or equal to 1 month Percent weight loss: 11.3 % Interventions: Ensure Enlive (each supplement provides 350kcal and 20 grams of protein), MVI   DVT prophylaxis:  SCDs Start: 08/29/23 2303  Code Status: Full code Family Communication: None at bedside Level of care:  Med-Surg Status is: Inpatient Remains inpatient appropriate because: MSSA bacteremia, septic arthritis, epidural abscess and severe canal stenosis   Final disposition: Likely home once medically stable Consultants:  Neurosurgery Infectious disease  35 minutes with more than 50% spent in reviewing records, counseling patient/family and coordinating care.   Sch Meds:  Scheduled Meds:  amLODipine  10 mg Oral Daily   ascorbic acid  500 mg Oral Daily   feeding supplement  237 mL Oral BID BM   multivitamin with minerals  1 tablet Oral Daily   pantoprazole  40 mg Oral Daily   rosuvastatin  40 mg Oral Daily   sodium chloride flush  3 mL Intravenous Q12H   Continuous Infusions:   ceFAZolin (ANCEF) IV 2 g (09/01/23 0616)   PRN Meds:.HYDROcodone-acetaminophen, morphine injection, ondansetron **OR** ondansetron (ZOFRAN) IV, [COMPLETED] polyethylene glycol **FOLLOWED BY** polyethylene glycol, [COMPLETED] senna-docusate **FOLLOWED BY** senna-docusate, traMADol  Antimicrobials: Anti-infectives (From admission, onward)    Start     Dose/Rate Route Frequency Ordered Stop   08/31/23 1400  ceFAZolin (ANCEF) IVPB 2g/100 mL premix        2 g 200 mL/hr over 30 Minutes Intravenous Every 8 hours 08/31/23 0935     08/30/23 1000  cefTRIAXone (ROCEPHIN) 2 g in sodium chloride 0.9 % 100 mL IVPB  Status:  Discontinued        2 g 200 mL/hr over 30 Minutes Intravenous Every 12 hours 08/29/23 2304 08/29/23 2314   08/30/23 1000  vancomycin (VANCOREADY) IVPB 1250 mg/250 mL  Status:  Discontinued        1,250 mg 166.7 mL/hr over 90 Minutes Intravenous Every 12 hours 08/30/23 0046 08/31/23 0935   08/30/23 0800  metroNIDAZOLE (FLAGYL) IVPB 500 mg  Status:  Discontinued        500 mg 100 mL/hr over 60 Minutes Intravenous Every 12 hours 08/30/23 0748 08/31/23 0935   08/29/23 2315  cefTRIAXone (ROCEPHIN) 2 g in sodium chloride 0.9 % 100 mL IVPB  Status:  Discontinued        2 g 200 mL/hr over 30 Minutes  Intravenous Every 12 hours 08/29/23 2314 08/31/23 0935   08/29/23 2200  vancomycin (VANCOREADY) IVPB 1750 mg/350 mL        1,750 mg 175 mL/hr over 120 Minutes Intravenous  Once 08/29/23 2152 08/30/23 0054   08/29/23 2145  ceFEPIme (MAXIPIME) 2 g in sodium chloride 0.9 % 100 mL IVPB        2 g 200 mL/hr over 30 Minutes Intravenous  Once 08/29/23 2139 08/29/23 2252        I have personally reviewed the following labs and images: CBC: Recent Labs  Lab 08/28/23 1138 08/29/23 2205 08/30/23 0523 08/31/23 0519 09/01/23 0339  WBC 9.8 10.4 10.3 9.6 10.7*  NEUTROABS 8.2* 8.4*  --   --   --   HGB 11.5* 10.6* 11.7* 10.8* 11.2*  HCT 34.3* 32.6* 35.2* 32.8* 34.9*  MCV 87.2 86.0 88.4 86.1 87.5  PLT 429.0* 357 377 384 473*   BMP &GFR Recent Labs  Lab 08/29/23 1600 08/30/23 0523 08/31/23 0519 09/01/23 0339  NA 132* 134* 135 136  K 3.7 3.8 3.9 3.9  CL 98 103 100 101  CO2 23  21* 21* 25  GLUCOSE 110* 102* 144* 115*  BUN 12 12 14 16   CREATININE 0.76 0.90 0.80 0.89  CALCIUM 8.4* 8.7* 8.3* 8.2*  MG 2.1  --  2.3 2.2  PHOS  --   --  4.5  --    Estimated Creatinine Clearance: 85.4 mL/min (by C-G formula based on SCr of 0.89 mg/dL). Liver & Pancreas: Recent Labs  Lab 08/28/23 1004 08/29/23 1600 08/30/23 0523 08/31/23 0519 09/01/23 0339  AST 93* 37 24 44* 31  ALT 210* 141* 116* 110* 96*  ALKPHOS 156* 134* 125 112 107  BILITOT 0.4 0.5 0.7 0.4 0.5  PROT 7.4 6.8 6.8 6.4* 6.3*  ALBUMIN 3.4* 2.5* 2.3* 2.0* 2.1*   No results for input(s): "LIPASE", "AMYLASE" in the last 168 hours. No results for input(s): "AMMONIA" in the last 168 hours. Diabetic: No results for input(s): "HGBA1C" in the last 72 hours. No results for input(s): "GLUCAP" in the last 168 hours. Cardiac Enzymes: No results for input(s): "CKTOTAL", "CKMB", "CKMBINDEX", "TROPONINI" in the last 168 hours. No results for input(s): "PROBNP" in the last 8760 hours. Coagulation Profile: No results for input(s): "INR",  "PROTIME" in the last 168 hours. Thyroid Function Tests: Recent Labs    08/29/23 1600  TSH 1.131   Lipid Profile: No results for input(s): "CHOL", "HDL", "LDLCALC", "TRIG", "CHOLHDL", "LDLDIRECT" in the last 72 hours. Anemia Panel: Recent Labs    08/29/23 1600  VITAMINB12 413   Urine analysis: No results found for: "COLORURINE", "APPEARANCEUR", "LABSPEC", "PHURINE", "GLUCOSEU", "HGBUR", "BILIRUBINUR", "KETONESUR", "PROTEINUR", "UROBILINOGEN", "NITRITE", "LEUKOCYTESUR" Sepsis Labs: Invalid input(s): "PROCALCITONIN", "LACTICIDVEN"  Microbiology: Recent Results (from the past 240 hour(s))  Blood culture (routine x 2)     Status: None (Preliminary result)   Collection Time: 08/29/23 10:05 PM   Specimen: BLOOD  Result Value Ref Range Status   Specimen Description BLOOD SITE NOT SPECIFIED  Final   Special Requests   Final    BOTTLES DRAWN AEROBIC AND ANAEROBIC Blood Culture adequate volume   Culture   Final    NO GROWTH 3 DAYS Performed at [facility], [address], [place], [place] [address]    Report Status PENDING  Incomplete  Blood culture (routine x 2)     Status: Abnormal (Preliminary result)   Collection Time: 08/29/23 10:07 PM   Specimen: BLOOD  Result Value Ref Range Status   Specimen Description BLOOD SITE NOT SPECIFIED  Final   Special Requests   Final    BOTTLES DRAWN AEROBIC AND ANAEROBIC Blood Culture adequate volume   Culture  Setup Time   Final    GRAM POSITIVE COCCI IN CLUSTERS ANAEROBIC BOTTLE ONLY CRITICAL RESULT CALLED TO, READ BACK BY AND VERIFIED WITH: PHARMD J. LEDFORD 08/31/23 @ 0545 BY AB    Culture (A)  Final    STAPHYLOCOCCUS AUREUS SUSCEPTIBILITIES TO FOLLOW Performed at [facility], [address] [place], [place] [address]    Report Status PENDING  Incomplete  Blood Culture ID Panel (Reflexed)     Status: Abnormal   Collection Time: 08/29/23 10:07 PM  Result Value Ref Range Status   Enterococcus faecalis NOT DETECTED NOT  DETECTED Final   Enterococcus Faecium NOT DETECTED NOT DETECTED Final   Listeria monocytogenes NOT DETECTED NOT DETECTED Final   Staphylococcus species DETECTED (A) NOT DETECTED Final    Comment: CRITICAL RESULT CALLED TO, READ BACK BY AND VERIFIED WITH: PHARMD J. LEDFORD 08/31/23 @ 0545 BY AB    Staphylococcus  aureus (BCID) DETECTED (A) NOT DETECTED Final    Comment: CRITICAL RESULT CALLED TO, READ BACK BY AND VERIFIED WITH: PHARMD J. LEDFORD 08/31/23 @ 0545 BY AB    Staphylococcus epidermidis NOT DETECTED NOT DETECTED Final   Staphylococcus lugdunensis NOT DETECTED NOT DETECTED Final   Streptococcus species NOT DETECTED NOT DETECTED Final   Streptococcus agalactiae NOT DETECTED NOT DETECTED Final   Streptococcus pneumoniae NOT DETECTED NOT DETECTED Final   Streptococcus pyogenes NOT DETECTED NOT DETECTED Final   A.calcoaceticus-baumannii NOT DETECTED NOT DETECTED Final   Bacteroides fragilis NOT DETECTED NOT DETECTED Final   Enterobacterales NOT DETECTED NOT DETECTED Final   Enterobacter cloacae complex NOT DETECTED NOT DETECTED Final   Escherichia coli NOT DETECTED NOT DETECTED Final   Klebsiella aerogenes NOT DETECTED NOT DETECTED Final   Klebsiella oxytoca NOT DETECTED NOT DETECTED Final   Klebsiella pneumoniae NOT DETECTED NOT DETECTED Final   Proteus species NOT DETECTED NOT DETECTED Final   Salmonella species NOT DETECTED NOT DETECTED Final   Serratia marcescens NOT DETECTED NOT DETECTED Final   Haemophilus influenzae NOT DETECTED NOT DETECTED Final   Neisseria meningitidis NOT DETECTED NOT DETECTED Final   Pseudomonas aeruginosa NOT DETECTED NOT DETECTED Final   Stenotrophomonas maltophilia NOT DETECTED NOT DETECTED Final   Candida albicans NOT DETECTED NOT DETECTED Final   Candida auris NOT DETECTED NOT DETECTED Final   Candida glabrata NOT DETECTED NOT DETECTED Final   Candida krusei NOT DETECTED NOT DETECTED Final   Candida parapsilosis NOT DETECTED NOT DETECTED Final    Candida tropicalis NOT DETECTED NOT DETECTED Final   Cryptococcus neoformans/gattii NOT DETECTED NOT DETECTED Final   Meth resistant mecA/C and MREJ NOT DETECTED NOT DETECTED Final    Comment: Performed at [facility], [address], [place], [place] [address]  Surgical pcr screen     Status: Abnormal   Collection Time: 08/30/23  7:34 AM   Specimen: Nasal Mucosa; Nasal Swab  Result Value Ref Range Status   MRSA, PCR NEGATIVE NEGATIVE Final   Staphylococcus aureus POSITIVE (A) NEGATIVE Final    Comment: (NOTE) The Xpert SA Assay (FDA approved for NASAL specimens in patients 8 years of age and older), is one component of a comprehensive surveillance program. It is not intended to diagnose infection nor to guide or monitor treatment. Performed at [facility], [address], [place], [place] [address]   Aerobic/Anaerobic Culture w Gram Stain (surgical/deep wound)     Status: None (Preliminary result)   Collection Time: 08/30/23  4:01 PM   Specimen: Wound; Abscess  Result Value Ref Range Status   Specimen Description ABSCESS  Final   Special Requests EPIDURAL  Final   Gram Stain   Final    FEW WBC PRESENT, PREDOMINANTLY PMN RARE GRAM POSITIVE COCCI Performed at [facility], [address], [place], [place] [address]    Culture   Final    ABUNDANT STAPHYLOCOCCUS AUREUS SUSCEPTIBILITIES TO FOLLOW NO ANAEROBES ISOLATED; CULTURE IN PROGRESS FOR 5 DAYS    Report Status PENDING  Incomplete  Culture, blood (Routine X 2) w Reflex to ID Panel     Status: None (Preliminary result)   Collection Time: 08/31/23  9:11 AM   Specimen: BLOOD LEFT HAND  Result Value Ref Range Status   Specimen Description BLOOD LEFT HAND  Final   Special Requests   Final    BOTTLES DRAWN AEROBIC AND ANAEROBIC Blood Culture adequate volume   Culture  Final    NO GROWTH < 24 HOURS Performed at [facility], [address], [place], [place] [address]    Report Status PENDING   Incomplete  Culture, blood (Routine X 2) w Reflex to ID Panel     Status: None (Preliminary result)   Collection Time: 08/31/23  9:11 AM   Specimen: BLOOD RIGHT HAND  Result Value Ref Range Status   Specimen Description BLOOD RIGHT HAND  Final   Special Requests   Final    BOTTLES DRAWN AEROBIC AND ANAEROBIC Blood Culture adequate volume   Culture   Final    NO GROWTH < 24 HOURS Performed at [facility], [address] [place], [place] [address]    Report Status PENDING  Incomplete    Radiology Studies: No results found.    Mao Lockner T. Daanish Copes [facility]  If 7PM-7AM, please contact night-coverage [email or web address] 09/01/2023, 11:53 AM

## 2023-09-01 NOTE — Progress Notes (Signed)
[facility]    Date of Admission:  08/29/2023   Total days of antibiotics 4/cefazolin          ID: MITCHUM URDANETA is a 67 y.o. male with  MSSA epidural abscess s/p L3-L5 open laminectomy and evacuation of epidural abscess and secondary bacteremia Principal Problem:   Abscess in epidural space of lumbar spine Active Problems:   Hyperlipidemia   HTN (hypertension)   Septic arthritis of lumbar spine (HCC)   Myositis of multiple muscles   Normocytic anemia   Elevated LFTs   Protein-calorie malnutrition, severe   MSSA bacteremia    Subjective: Afebrile, mild back pain, ambulating without difficulty  Medications:   amLODipine  10 mg Oral Daily   ascorbic acid  500 mg Oral Daily   feeding supplement  237 mL Oral BID BM   multivitamin with minerals  1 tablet Oral Daily   pantoprazole  40 mg Oral Daily   rosuvastatin  40 mg Oral Daily   sodium chloride flush  3 mL Intravenous Q12H    Objective: Vital signs in last 24 hours: Temp:  [98 F (36.7 C)-99.2 F (37.3 C)] 99.2 F (37.3 C) (10/04 1543) Pulse Rate:  [78-91] 91 (10/04 1543) Resp:  [18] 18 (10/03 1958) BP: (123-138)/(75-90) 132/77 (10/04 1543) SpO2:  [92 %-97 %] 97 % (10/04 1543) Physical Exam  Constitutional: He is oriented to person, place, and time. He appears well-developed and well-nourished. No distress.  HENT:  Mouth/Throat: Oropharynx is clear and moist. No oropharyngeal exudate.  Cardiovascular: Normal rate, regular rhythm and normal heart sounds. Exam reveals no gallop and no friction rub.  No murmur heard.  Pulmonary/Chest: Effort normal and breath sounds normal. No respiratory distress. He has no wheezes.  Abdominal: Soft. Bowel sounds are normal. He exhibits no distension. There is no tenderness.  Back= c/d/I-incision glued Lymphadenopathy:  He has no cervical adenopathy.  Neurological: He is alert and oriented to person, place, and time.  Skin: Skin is warm and dry. No  rash noted. No erythema.  Psychiatric: He has a normal mood and affect. His behavior is normal.    Lab Results Recent Labs    08/31/23 0519 09/01/23 0339  WBC 9.6 10.7*  HGB 10.8* 11.2*  HCT 32.8* 34.9*  NA 135 136  K 3.9 3.9  CL 100 101  CO2 21* 25  BUN 14 16  CREATININE 0.80 0.89   Liver Panel Recent Labs    08/31/23 0519 09/01/23 0339  PROT 6.4* 6.3*  ALBUMIN 2.0* 2.1*  AST 44* 31  ALT 110* 96*  ALKPHOS 112 107  BILITOT 0.4 0.5   Sedimentation Rate Recent Labs    08/29/23 2205  ESRSEDRATE 130*   C-Reactive Protein Recent Labs    08/30/23 0802  CRP 23.9*    Microbiology: 10/3 blood cx NGTD 10/2 or culture: staph aureus, sensitivities pending 10/1 blood cx MSSA 10/1 blood cx NGTD  Studies/Results: ECHOCARDIOGRAM COMPLETE  Result Date: 09/01/2023    ECHOCARDIOGRAM REPORT   Patient Name:   LYNDALL COUNTERMAN Date of Exam: 09/01/2023 Medical Rec #:  [id number]      Height:       70.0 in Accession #:    [id number]     Weight:       175.0 lb Date of Birth:  [birth year]      BSA:          1.972 m Patient Age:    68 years  BP:           138/90 mmHg Patient Gender: M              HR:           80 bpm. Exam Location:  Inpatient Procedure: 2D Echo, 3D Echo, Cardiac Doppler and Color Doppler Indications:    Bacteremia R78.81  History:        Patient has no prior history of Echocardiogram examinations.                 CAD, Signs/Symptoms:Chest Pain; Risk Factors:Hypertension and                 Dyslipidemia.  Sonographer:    Lucendia Herrlich RCS Referring Phys: [id number] TAYE T GONFA IMPRESSIONS  1. Left ventricular ejection fraction, by estimation, is 60 to 65%. Left ventricular ejection fraction by 3D volume is 67 %. The left ventricle has normal function. The left ventricle has no regional wall motion abnormalities. Left ventricular diastolic  parameters are indeterminate.  2. Right ventricular systolic function is normal. The right ventricular size is normal.  3. The mitral  valve is normal in structure. Trivial mitral valve regurgitation. No evidence of mitral stenosis.  4. The aortic valve is normal in structure. Aortic valve regurgitation is not visualized. No aortic stenosis is present.  5. Hemangioma noted in the liver . The inferior vena cava is normal in size with greater than 50% respiratory variability, suggesting right atrial pressure of 3 mmHg. FINDINGS  Left Ventricle: Left ventricular ejection fraction, by estimation, is 60 to 65%. Left ventricular ejection fraction by 3D volume is 67 %. The left ventricle has normal function. The left ventricle has no regional wall motion abnormalities. The left ventricular internal cavity size was normal in size. There is no left ventricular hypertrophy. Left ventricular diastolic parameters are indeterminate. Right Ventricle: The right ventricular size is normal. No increase in right ventricular wall thickness. Right ventricular systolic function is normal. Left Atrium: Left atrial size was normal in size. Right Atrium: Right atrial size was normal in size. Pericardium: There is no evidence of pericardial effusion. Mitral Valve: The mitral valve is normal in structure. Trivial mitral valve regurgitation. No evidence of mitral valve stenosis. Tricuspid Valve: The tricuspid valve is normal in structure. Tricuspid valve regurgitation is not demonstrated. No evidence of tricuspid stenosis. Aortic Valve: The aortic valve is normal in structure. Aortic valve regurgitation is not visualized. No aortic stenosis is present. Aortic valve peak gradient measures 8.8 mmHg. Pulmonic Valve: The pulmonic valve was normal in structure. Pulmonic valve regurgitation is not visualized. No evidence of pulmonic stenosis. Aorta: The aortic root is normal in size and structure. Venous: Hemangioma noted in the liver. The inferior vena cava is normal in size with greater than 50% respiratory variability, suggesting right atrial pressure of 3 mmHg. IAS/Shunts: No  atrial level shunt detected by color flow Doppler.  LEFT VENTRICLE PLAX 2D LVIDd:         4.70 cm         Diastology LVIDs:         2.35 cm         LV e' medial:    6.85 cm/s LV PW:         1.00 cm         LV E/e' medial:  8.7 LV IVS:        1.10 cm         LV e' lateral:  12.60 cm/s LVOT diam:     2.10 cm         LV E/e' lateral: 4.7 LV SV:         68 LV SV Index:   35 LVOT Area:     3.46 cm        3D Volume EF                                LV 3D EF:    Left                                             ventricul                                             ar                                             ejection                                             fraction                                             by 3D                                             volume is                                             67 %.                                 3D Volume EF:                                3D EF:        67 %                                LV EDV:       139 ml                                LV ESV:       46 ml  LV SV:        93 ml RIGHT VENTRICLE             IVC RV S prime:     16.30 cm/s  IVC diam: 1.50 cm TAPSE (M-mode): 3.0 cm LEFT ATRIUM             Index        RIGHT ATRIUM           Index LA diam:        3.40 cm 1.72 cm/m   RA Area:     17.50 cm LA Vol (A2C):   45.3 ml 22.97 ml/m  RA Volume:   37.00 ml  18.76 ml/m LA Vol (A4C):   40.4 ml 20.49 ml/m LA Biplane Vol: 44.9 ml 22.77 ml/m  AORTIC VALVE AV Area (Vmax): 2.48 cm AV Vmax:        148.00 cm/s AV Peak Grad:   8.8 mmHg LVOT Vmax:      106.00 cm/s LVOT Vmean:     67.667 cm/s LVOT VTI:       0.197 m  AORTA Ao Root diam: 3.60 cm Ao Asc diam:  3.50 cm MITRAL VALVE               TRICUSPID VALVE MV Area (PHT): 3.72 cm    TR Peak grad:   17.5 mmHg MV Decel Time: 204 msec    TR Vmax:        209.00 cm/s MR Peak grad: 82.4 mmHg MR Vmax:      454.00 cm/s  SHUNTS MV E velocity: 59.70 cm/s  Systemic VTI:  0.20 m MV A velocity:  68.40 cm/s  Systemic Diam: 2.10 cm MV E/A ratio:  0.87 Kardie Tobb DO Electronically signed by Thomasene Ripple DO Signature Date/Time: 09/01/2023/1:22:12 PM    Final     Assessment/Plan: MSSA lumbar epidural abscess with secondary bacteremia s/p L3-L5 laminectomy/evacuation of abscess = continue with cefazolin 2gm IV q8hr  Bacteremia - if blood cx are no growth tomorrow, he can have picc line placed Plan for TEE on Monday to ensure no sign of endocarditis  Leukocytosis = anticipate to trend down with abtx  Plan for 6 wk of iv abtx using 10/3 as day 1   I have personally spent 50 minutes involved in face-to-face and non-face-to-face activities for this patient on the day of the visit. Professional time spent includes the following activities: Preparing to see the patient (review of tests), Obtaining and/or reviewing separately obtained history (admission/discharge record), Performing a medically appropriate examination and/or evaluation , Ordering medications/tests/procedures, referring and communicating with other health care professionals, Documenting clinical information in the EMR, Independently interpreting results (not separately reported), Communicating results to the patient/family/caregiver, Counseling and educating the patient/family/caregiver and Care coordination (not separately reported).     [facility] Pager: [phone]  09/01/2023, 3:47 PM

## 2023-09-01 NOTE — Care Management Important Message (Signed)
Important Message  Patient Details  Name: JILES GOYA MRN: [id number] Date of Birth: 1956/06/20   Important Message Given:  Yes - Medicare IM     Sherilyn Banker 09/01/2023, 12:06 PM

## 2023-09-01 NOTE — Plan of Care (Signed)
  Problem: Activity: Goal: Risk for activity intolerance will decrease Outcome: Progressing   Problem: Nutrition: Goal: Adequate nutrition will be maintained Outcome: Progressing   Problem: Pain Managment: Goal: General experience of comfort will improve Outcome: Progressing   

## 2023-09-01 NOTE — Care Plan (Signed)
Updated patient's wife over the phone. 

## 2023-09-01 NOTE — Progress Notes (Addendum)
Neurosurgery Service Progress Note  Subjective: No acute events overnight, feels dramatically better than preop, he's quite pleased   Objective: Vitals:   08/31/23 1700 08/31/23 1958 09/01/23 0613 09/01/23 0728  BP: 123/75 129/79 136/85 (!) 138/90  Pulse: 78 78 87 86  Resp: 18 18    Temp: 98.4 F (36.9 C) 98.2 F (36.8 C) 98.6 F (37 C) 98 F (36.7 C)  TempSrc: Oral Oral Oral Oral  SpO2: 97% 92% 95% 93%  Weight:      Height:        Physical Exam: Strength 5/5 x4 and SILTx4  Assessment & Plan: 67 y.o. man s/p lami for evac of epidural abscess, recovering well.  -okay for DVT chemoPPx from my standpoint 10/4 -activity as tolerated, no restrictions, okay to shower, no dressing needed -will place follow up instructions in the discharge tab in Epic, f/u with me in 2 weeks -if anything is needed or any concerns over the weekend, please page the neurosurgeon on call for assistance  Maisie Fus A Prisha Hiley  09/01/23 11:00 AM

## 2023-09-01 NOTE — Plan of Care (Signed)
  Problem: Activity: Goal: Risk for activity intolerance will decrease Outcome: Progressing   Problem: Coping: Goal: Level of anxiety will decrease Outcome: Progressing   Problem: Pain Managment: Goal: General experience of comfort will improve Outcome: Progressing   

## 2023-09-01 NOTE — Progress Notes (Signed)
Echocardiogram 2D Echocardiogram has been performed.  Lucendia Herrlich 09/01/2023, 12:14 PM

## 2023-09-01 NOTE — Plan of Care (Signed)
  Problem: Health Behavior/Discharge Planning: Goal: Ability to manage health-related needs will improve Outcome: Progressing   Problem: Activity: Goal: Risk for activity intolerance will decrease Outcome: Progressing   Problem: Pain Managment: Goal: General experience of comfort will improve Outcome: Progressing   

## 2023-09-02 ENCOUNTER — Other Ambulatory Visit: Payer: Self-pay

## 2023-09-02 DIAGNOSIS — G061 Intraspinal abscess and granuloma: Secondary | ICD-10-CM | POA: Diagnosis not present

## 2023-09-02 DIAGNOSIS — R7989 Other specified abnormal findings of blood chemistry: Secondary | ICD-10-CM | POA: Diagnosis not present

## 2023-09-02 DIAGNOSIS — E7800 Pure hypercholesterolemia, unspecified: Secondary | ICD-10-CM | POA: Diagnosis not present

## 2023-09-02 DIAGNOSIS — M4656 Other infective spondylopathies, lumbar region: Secondary | ICD-10-CM | POA: Diagnosis not present

## 2023-09-02 LAB — HEPATIC FUNCTION PANEL
ALT: 66 U/L — ABNORMAL HIGH (ref 0–44)
AST: 47 U/L — ABNORMAL HIGH (ref 15–41)
Albumin: 2.2 g/dL — ABNORMAL LOW (ref 3.5–5.0)
Alkaline Phosphatase: 107 U/L (ref 38–126)
Bilirubin, Direct: <0.1 mg/dL (ref 0.0–0.2)
Total Bilirubin: 0.2 mg/dL — ABNORMAL LOW (ref 0.3–1.2)
Total Protein: 6.4 g/dL — ABNORMAL LOW (ref 6.5–8.1)

## 2023-09-02 LAB — CBC
HCT: 34.7 % — ABNORMAL LOW (ref 39.0–52.0)
Hemoglobin: 11.1 g/dL — ABNORMAL LOW (ref 13.0–17.0)
MCH: 28.6 pg (ref 26.0–34.0)
MCHC: 32 g/dL (ref 30.0–36.0)
MCV: 89.4 fL (ref 80.0–100.0)
Platelets: 470 10*3/uL — ABNORMAL HIGH (ref 150–400)
RBC: 3.88 MIL/uL — ABNORMAL LOW (ref 4.22–5.81)
RDW: 12.6 % (ref 11.5–15.5)
WBC: 9.7 10*3/uL (ref 4.0–10.5)
nRBC: 0 % (ref 0.0–0.2)

## 2023-09-02 MED ORDER — POLYETHYLENE GLYCOL 3350 17 G PO PACK
17.0000 g | PACK | Freq: Two times a day (BID) | ORAL | Status: AC
  Administered 2023-09-02: 17 g via ORAL
  Filled 2023-09-02 (×2): qty 1

## 2023-09-02 MED ORDER — SENNOSIDES-DOCUSATE SODIUM 8.6-50 MG PO TABS
2.0000 | ORAL_TABLET | Freq: Two times a day (BID) | ORAL | Status: AC
  Administered 2023-09-02 (×2): 2 via ORAL
  Filled 2023-09-02 (×2): qty 2

## 2023-09-02 MED ORDER — SENNOSIDES-DOCUSATE SODIUM 8.6-50 MG PO TABS
2.0000 | ORAL_TABLET | Freq: Two times a day (BID) | ORAL | Status: DC | PRN
  Administered 2023-09-03: 2 via ORAL
  Filled 2023-09-02: qty 2

## 2023-09-02 MED ORDER — POLYETHYLENE GLYCOL 3350 17 G PO PACK: 17.0000 g | PACK | Freq: Two times a day (BID) | ORAL | Status: DC | PRN

## 2023-09-02 MED ORDER — MELATONIN 3 MG PO TABS
6.0000 mg | ORAL_TABLET | Freq: Every day | ORAL | Status: DC
  Administered 2023-09-02 – 2023-09-03 (×2): 6 mg via ORAL
  Filled 2023-09-02 (×2): qty 2

## 2023-09-02 NOTE — Progress Notes (Signed)
PROGRESS NOTE  Dan Sanchez UJW:[id number] DOB: 1956-03-22   PCP: Dan Hatch, MD  Patient is from: Home.  Independently ambulates at baseline.  DOA: 08/29/2023 LOS: 4  Chief complaints Chief Complaint  Patient presents with   Extremity Weakness    Bilateral      Brief Narrative / Interim history: 67 year old M with PMH of HTN and HLD presenting with back pain, leg swelling and weakness, night sweat and unintentional weight loss, and admitted with lumbar septic arthritis, dorsal epidural abscesses right psoas and bilateral paraspinous muscle myositis with multiple Sanchez abscesses, and severe spinal canal stenosis at L3-4 and L4-5 as noted on MRI lumbar spine.  Blood cultures obtained.  Started on vancomycin and ceftriaxone.  Neurosurgery consulted.  The next day, patient underwent L3-4, L4-5 open laminectomy and evacuation of epidural abscess. ID consulted.  Blood culture with MSSA.  Repeat blood culture NGTD.  Antibiotic de-escalated to IV Ancef.  TTE without significant finding.  TEE planned for 10/7.     Subjective: Seen and examined earlier this morning.  No major events overnight of this morning other than difficulty sleeping.  Felt hot last night although his temperature was okay.  Has not a bowel movement yet.  Objective: Vitals:   09/01/23 1543 09/01/23 2146 09/02/23 0508 09/02/23 0821  BP: 132/77 135/73 (!) 146/73 (!) 142/83  Pulse: 91 81 79 94  Resp:    16  Temp: 99.2 F (37.3 C) 98.3 F (36.8 C) 98.2 F (36.8 C) 98.8 F (37.1 C)  TempSrc: Oral Oral Oral Oral  SpO2: 97% 93% 97% 95%  Weight:      Height:        Examination:  GENERAL: No apparent distress.  Nontoxic. HEENT: MMM.  Vision and hearing grossly intact.  NECK: Supple.  No apparent JVD.  RESP:  No IWOB.  Fair aeration bilaterally. CVS:  RRR. Heart sounds normal.  ABD/GI/GU: BS+. Abd soft, NTND.  MSK/EXT:  Moves extremities. No apparent deformity. No edema.  SKIN: Surgical incision  DCI. NEURO: Awake, alert and oriented appropriately.  No apparent focal neuro deficit.  5/5 in all extremities. PSYCH: Calm. Normal affect.   Procedures:  10/2-L3-4 and L4-5 open laminectomy and evacuation of epidural abscess by Dr. Maurice Sanchez  Microbiology summarized: 10/1-blood cultures MSSA 10/2-epidural abscess culture with abundant Staph aureus. 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 Maurice Sanchez.  -CTX and vancomycin 10/1> Ancef 10/3>> -Repeat blood culture NGTD. -TTE without significant finding. -TEE planned for 10/7. -ID following   Elevated LFTs: Mild.  Likely due to the above.  Improving. -Continue to monitor   Hypertension: Normotensive for most part. -Continue home amlodipine.   Normocytic anemia: Stable.  Morning labs pending   Hyperlipidemia: -Continue home statin.  Constipation -Continue scheduled bowel regimen until he has bowel movement  Severe malnutrition: POA Body mass index is 25.11 kg/m. Nutrition Problem: Severe Malnutrition Etiology: chronic illness (epidural abscess, septic arthritis) Signs/Symptoms: percent weight loss, energy intake <  SENSITIVE Sensitive     GENTAMICIN <=0.5 SENSITIVE Sensitive     OXACILLIN 0.5 SENSITIVE Sensitive     TETRACYCLINE <=1 SENSITIVE Sensitive     VANCOMYCIN 1 SENSITIVE Sensitive     TRIMETH/SULFA <=10 SENSITIVE Sensitive     CLINDAMYCIN <=0.25 SENSITIVE Sensitive     RIFAMPIN <=0.5 SENSITIVE Sensitive     Inducible Clindamycin NEGATIVE Sensitive     LINEZOLID 2 SENSITIVE Sensitive     * ABUNDANT STAPHYLOCOCCUS AUREUS  Culture, blood (Routine X 2) w Reflex to ID Panel     Status: None (Preliminary result)   Collection Time: 08/31/23  9:11 AM   Specimen: BLOOD LEFT HAND  Result Value Ref Range Status   Specimen Description BLOOD LEFT HAND  Final   Special Requests   Final    BOTTLES DRAWN AEROBIC AND ANAEROBIC Blood Culture adequate volume   Culture   Final    NO GROWTH 2 DAYS Performed at [facility], [address] [place], [place] [address]    Report Status PENDING  Incomplete  Culture, blood (Routine X 2) w Reflex to ID Panel     Status: None (Preliminary result)   Collection Time: 08/31/23  9:11 AM   Specimen: BLOOD RIGHT HAND  Result Value Ref Range Status   Specimen Description BLOOD RIGHT HAND  Final   Special Requests   Final    BOTTLES DRAWN AEROBIC AND ANAEROBIC Blood Culture adequate volume   Culture   Final    NO GROWTH 2 DAYS Performed at [facility], [address] [place], [place] [address]    Report Status PENDING  Incomplete    Radiology Studies: ECHOCARDIOGRAM COMPLETE  Result Date: 09/01/2023    ECHOCARDIOGRAM REPORT   Patient Name:   Dan Sanchez Date of Exam: 09/01/2023 Medical Rec #:  [id number]      Height:       70.0 in Accession #:    [id number]     Weight:       175.0 lb Date of Birth:  [birth year]      BSA:          1.972 m Patient Age:    67 years       BP:           138/90 mmHg Patient Gender: M              HR:           80 bpm. Exam Location:  Inpatient Procedure: 2D Echo, 3D Echo, Cardiac Doppler and Color  Doppler Indications:    Bacteremia R78.81  History:        Patient has no prior history of Echocardiogram examinations.                 CAD, Signs/Symptoms:Chest Pain; Risk Factors:Hypertension and                 Dyslipidemia.  Sonographer:    Dan Sanchez RCS Referring Phys: [id number] Dan Sanchez Dan Sanchez IMPRESSIONS  1. Left ventricular ejection fraction, by estimation, is 60 to 65%. Left ventricular ejection fraction by 3D volume is 67 %. The left ventricle has normal function. The left ventricle has no regional wall motion abnormalities. Left ventricular diastolic  parameters are indeterminate.  2. Right ventricular systolic function is normal. The right ventricular size is normal.  3. The mitral valve is normal in structure. Trivial mitral valve regurgitation. No evidence of mitral stenosis.  4. The aortic  SENSITIVE Sensitive     GENTAMICIN <=0.5 SENSITIVE Sensitive     OXACILLIN 0.5 SENSITIVE Sensitive     TETRACYCLINE <=1 SENSITIVE Sensitive     VANCOMYCIN 1 SENSITIVE Sensitive     TRIMETH/SULFA <=10 SENSITIVE Sensitive     CLINDAMYCIN <=0.25 SENSITIVE Sensitive     RIFAMPIN <=0.5 SENSITIVE Sensitive     Inducible Clindamycin NEGATIVE Sensitive     LINEZOLID 2 SENSITIVE Sensitive     * ABUNDANT STAPHYLOCOCCUS AUREUS  Culture, blood (Routine X 2) w Reflex to ID Panel     Status: None (Preliminary result)   Collection Time: 08/31/23  9:11 AM   Specimen: BLOOD LEFT HAND  Result Value Ref Range Status   Specimen Description BLOOD LEFT HAND  Final   Special Requests   Final    BOTTLES DRAWN AEROBIC AND ANAEROBIC Blood Culture adequate volume   Culture   Final    NO GROWTH 2 DAYS Performed at [facility], [address] [place], [place] [address]    Report Status PENDING  Incomplete  Culture, blood (Routine X 2) w Reflex to ID Panel     Status: None (Preliminary result)   Collection Time: 08/31/23  9:11 AM   Specimen: BLOOD RIGHT HAND  Result Value Ref Range Status   Specimen Description BLOOD RIGHT HAND  Final   Special Requests   Final    BOTTLES DRAWN AEROBIC AND ANAEROBIC Blood Culture adequate volume   Culture   Final    NO GROWTH 2 DAYS Performed at [facility], [address] [place], [place] [address]    Report Status PENDING  Incomplete    Radiology Studies: ECHOCARDIOGRAM COMPLETE  Result Date: 09/01/2023    ECHOCARDIOGRAM REPORT   Patient Name:   Dan Sanchez Date of Exam: 09/01/2023 Medical Rec #:  [id number]      Height:       70.0 in Accession #:    [id number]     Weight:       175.0 lb Date of Birth:  [birth year]      BSA:          1.972 m Patient Age:    67 years       BP:           138/90 mmHg Patient Gender: M              HR:           80 bpm. Exam Location:  Inpatient Procedure: 2D Echo, 3D Echo, Cardiac Doppler and Color  Doppler Indications:    Bacteremia R78.81  History:        Patient has no prior history of Echocardiogram examinations.                 CAD, Signs/Symptoms:Chest Pain; Risk Factors:Hypertension and                 Dyslipidemia.  Sonographer:    Dan Sanchez RCS Referring Phys: [id number] Dan Sanchez Dan Sanchez IMPRESSIONS  1. Left ventricular ejection fraction, by estimation, is 60 to 65%. Left ventricular ejection fraction by 3D volume is 67 %. The left ventricle has normal function. The left ventricle has no regional wall motion abnormalities. Left ventricular diastolic  parameters are indeterminate.  2. Right ventricular systolic function is normal. The right ventricular size is normal.  3. The mitral valve is normal in structure. Trivial mitral valve regurgitation. No evidence of mitral stenosis.  4. The aortic  PROGRESS NOTE  Dan Sanchez UJW:[id number] DOB: 1956-03-22   PCP: Dan Hatch, MD  Patient is from: Home.  Independently ambulates at baseline.  DOA: 08/29/2023 LOS: 4  Chief complaints Chief Complaint  Patient presents with   Extremity Weakness    Bilateral      Brief Narrative / Interim history: 67 year old M with PMH of HTN and HLD presenting with back pain, leg swelling and weakness, night sweat and unintentional weight loss, and admitted with lumbar septic arthritis, dorsal epidural abscesses right psoas and bilateral paraspinous muscle myositis with multiple Sanchez abscesses, and severe spinal canal stenosis at L3-4 and L4-5 as noted on MRI lumbar spine.  Blood cultures obtained.  Started on vancomycin and ceftriaxone.  Neurosurgery consulted.  The next day, patient underwent L3-4, L4-5 open laminectomy and evacuation of epidural abscess. ID consulted.  Blood culture with MSSA.  Repeat blood culture NGTD.  Antibiotic de-escalated to IV Ancef.  TTE without significant finding.  TEE planned for 10/7.     Subjective: Seen and examined earlier this morning.  No major events overnight of this morning other than difficulty sleeping.  Felt hot last night although his temperature was okay.  Has not a bowel movement yet.  Objective: Vitals:   09/01/23 1543 09/01/23 2146 09/02/23 0508 09/02/23 0821  BP: 132/77 135/73 (!) 146/73 (!) 142/83  Pulse: 91 81 79 94  Resp:    16  Temp: 99.2 F (37.3 C) 98.3 F (36.8 C) 98.2 F (36.8 C) 98.8 F (37.1 C)  TempSrc: Oral Oral Oral Oral  SpO2: 97% 93% 97% 95%  Weight:      Height:        Examination:  GENERAL: No apparent distress.  Nontoxic. HEENT: MMM.  Vision and hearing grossly intact.  NECK: Supple.  No apparent JVD.  RESP:  No IWOB.  Fair aeration bilaterally. CVS:  RRR. Heart sounds normal.  ABD/GI/GU: BS+. Abd soft, NTND.  MSK/EXT:  Moves extremities. No apparent deformity. No edema.  SKIN: Surgical incision  DCI. NEURO: Awake, alert and oriented appropriately.  No apparent focal neuro deficit.  5/5 in all extremities. PSYCH: Calm. Normal affect.   Procedures:  10/2-L3-4 and L4-5 open laminectomy and evacuation of epidural abscess by Dr. Maurice Sanchez  Microbiology summarized: 10/1-blood cultures MSSA 10/2-epidural abscess culture with abundant Staph aureus. 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 Maurice Sanchez.  -CTX and vancomycin 10/1> Ancef 10/3>> -Repeat blood culture NGTD. -TTE without significant finding. -TEE planned for 10/7. -ID following   Elevated LFTs: Mild.  Likely due to the above.  Improving. -Continue to monitor   Hypertension: Normotensive for most part. -Continue home amlodipine.   Normocytic anemia: Stable.  Morning labs pending   Hyperlipidemia: -Continue home statin.  Constipation -Continue scheduled bowel regimen until he has bowel movement  Severe malnutrition: POA Body mass index is 25.11 kg/m. Nutrition Problem: Severe Malnutrition Etiology: chronic illness (epidural abscess, septic arthritis) Signs/Symptoms: percent weight loss, energy intake <  SENSITIVE Sensitive     GENTAMICIN <=0.5 SENSITIVE Sensitive     OXACILLIN 0.5 SENSITIVE Sensitive     TETRACYCLINE <=1 SENSITIVE Sensitive     VANCOMYCIN 1 SENSITIVE Sensitive     TRIMETH/SULFA <=10 SENSITIVE Sensitive     CLINDAMYCIN <=0.25 SENSITIVE Sensitive     RIFAMPIN <=0.5 SENSITIVE Sensitive     Inducible Clindamycin NEGATIVE Sensitive     LINEZOLID 2 SENSITIVE Sensitive     * ABUNDANT STAPHYLOCOCCUS AUREUS  Culture, blood (Routine X 2) w Reflex to ID Panel     Status: None (Preliminary result)   Collection Time: 08/31/23  9:11 AM   Specimen: BLOOD LEFT HAND  Result Value Ref Range Status   Specimen Description BLOOD LEFT HAND  Final   Special Requests   Final    BOTTLES DRAWN AEROBIC AND ANAEROBIC Blood Culture adequate volume   Culture   Final    NO GROWTH 2 DAYS Performed at [facility], [address] [place], [place] [address]    Report Status PENDING  Incomplete  Culture, blood (Routine X 2) w Reflex to ID Panel     Status: None (Preliminary result)   Collection Time: 08/31/23  9:11 AM   Specimen: BLOOD RIGHT HAND  Result Value Ref Range Status   Specimen Description BLOOD RIGHT HAND  Final   Special Requests   Final    BOTTLES DRAWN AEROBIC AND ANAEROBIC Blood Culture adequate volume   Culture   Final    NO GROWTH 2 DAYS Performed at [facility], [address] [place], [place] [address]    Report Status PENDING  Incomplete    Radiology Studies: ECHOCARDIOGRAM COMPLETE  Result Date: 09/01/2023    ECHOCARDIOGRAM REPORT   Patient Name:   Dan Sanchez Date of Exam: 09/01/2023 Medical Rec #:  [id number]      Height:       70.0 in Accession #:    [id number]     Weight:       175.0 lb Date of Birth:  [birth year]      BSA:          1.972 m Patient Age:    67 years       BP:           138/90 mmHg Patient Gender: M              HR:           80 bpm. Exam Location:  Inpatient Procedure: 2D Echo, 3D Echo, Cardiac Doppler and Color  Doppler Indications:    Bacteremia R78.81  History:        Patient has no prior history of Echocardiogram examinations.                 CAD, Signs/Symptoms:Chest Pain; Risk Factors:Hypertension and                 Dyslipidemia.  Sonographer:    Dan Sanchez RCS Referring Phys: [id number] Dan Sanchez Dan Sanchez IMPRESSIONS  1. Left ventricular ejection fraction, by estimation, is 60 to 65%. Left ventricular ejection fraction by 3D volume is 67 %. The left ventricle has normal function. The left ventricle has no regional wall motion abnormalities. Left ventricular diastolic  parameters are indeterminate.  2. Right ventricular systolic function is normal. The right ventricular size is normal.  3. The mitral valve is normal in structure. Trivial mitral valve regurgitation. No evidence of mitral stenosis.  4. The aortic  PROGRESS NOTE  Dan Sanchez UJW:[id number] DOB: 1956-03-22   PCP: Dan Hatch, MD  Patient is from: Home.  Independently ambulates at baseline.  DOA: 08/29/2023 LOS: 4  Chief complaints Chief Complaint  Patient presents with   Extremity Weakness    Bilateral      Brief Narrative / Interim history: 67 year old M with PMH of HTN and HLD presenting with back pain, leg swelling and weakness, night sweat and unintentional weight loss, and admitted with lumbar septic arthritis, dorsal epidural abscesses right psoas and bilateral paraspinous muscle myositis with multiple Sanchez abscesses, and severe spinal canal stenosis at L3-4 and L4-5 as noted on MRI lumbar spine.  Blood cultures obtained.  Started on vancomycin and ceftriaxone.  Neurosurgery consulted.  The next day, patient underwent L3-4, L4-5 open laminectomy and evacuation of epidural abscess. ID consulted.  Blood culture with MSSA.  Repeat blood culture NGTD.  Antibiotic de-escalated to IV Ancef.  TTE without significant finding.  TEE planned for 10/7.     Subjective: Seen and examined earlier this morning.  No major events overnight of this morning other than difficulty sleeping.  Felt hot last night although his temperature was okay.  Has not a bowel movement yet.  Objective: Vitals:   09/01/23 1543 09/01/23 2146 09/02/23 0508 09/02/23 0821  BP: 132/77 135/73 (!) 146/73 (!) 142/83  Pulse: 91 81 79 94  Resp:    16  Temp: 99.2 F (37.3 C) 98.3 F (36.8 C) 98.2 F (36.8 C) 98.8 F (37.1 C)  TempSrc: Oral Oral Oral Oral  SpO2: 97% 93% 97% 95%  Weight:      Height:        Examination:  GENERAL: No apparent distress.  Nontoxic. HEENT: MMM.  Vision and hearing grossly intact.  NECK: Supple.  No apparent JVD.  RESP:  No IWOB.  Fair aeration bilaterally. CVS:  RRR. Heart sounds normal.  ABD/GI/GU: BS+. Abd soft, NTND.  MSK/EXT:  Moves extremities. No apparent deformity. No edema.  SKIN: Surgical incision  DCI. NEURO: Awake, alert and oriented appropriately.  No apparent focal neuro deficit.  5/5 in all extremities. PSYCH: Calm. Normal affect.   Procedures:  10/2-L3-4 and L4-5 open laminectomy and evacuation of epidural abscess by Dr. Maurice Sanchez  Microbiology summarized: 10/1-blood cultures MSSA 10/2-epidural abscess culture with abundant Staph aureus. 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 Maurice Sanchez.  -CTX and vancomycin 10/1> Ancef 10/3>> -Repeat blood culture NGTD. -TTE without significant finding. -TEE planned for 10/7. -ID following   Elevated LFTs: Mild.  Likely due to the above.  Improving. -Continue to monitor   Hypertension: Normotensive for most part. -Continue home amlodipine.   Normocytic anemia: Stable.  Morning labs pending   Hyperlipidemia: -Continue home statin.  Constipation -Continue scheduled bowel regimen until he has bowel movement  Severe malnutrition: POA Body mass index is 25.11 kg/m. Nutrition Problem: Severe Malnutrition Etiology: chronic illness (epidural abscess, septic arthritis) Signs/Symptoms: percent weight loss, energy intake <  SENSITIVE Sensitive     GENTAMICIN <=0.5 SENSITIVE Sensitive     OXACILLIN 0.5 SENSITIVE Sensitive     TETRACYCLINE <=1 SENSITIVE Sensitive     VANCOMYCIN 1 SENSITIVE Sensitive     TRIMETH/SULFA <=10 SENSITIVE Sensitive     CLINDAMYCIN <=0.25 SENSITIVE Sensitive     RIFAMPIN <=0.5 SENSITIVE Sensitive     Inducible Clindamycin NEGATIVE Sensitive     LINEZOLID 2 SENSITIVE Sensitive     * ABUNDANT STAPHYLOCOCCUS AUREUS  Culture, blood (Routine X 2) w Reflex to ID Panel     Status: None (Preliminary result)   Collection Time: 08/31/23  9:11 AM   Specimen: BLOOD LEFT HAND  Result Value Ref Range Status   Specimen Description BLOOD LEFT HAND  Final   Special Requests   Final    BOTTLES DRAWN AEROBIC AND ANAEROBIC Blood Culture adequate volume   Culture   Final    NO GROWTH 2 DAYS Performed at [facility], [address] [place], [place] [address]    Report Status PENDING  Incomplete  Culture, blood (Routine X 2) w Reflex to ID Panel     Status: None (Preliminary result)   Collection Time: 08/31/23  9:11 AM   Specimen: BLOOD RIGHT HAND  Result Value Ref Range Status   Specimen Description BLOOD RIGHT HAND  Final   Special Requests   Final    BOTTLES DRAWN AEROBIC AND ANAEROBIC Blood Culture adequate volume   Culture   Final    NO GROWTH 2 DAYS Performed at [facility], [address] [place], [place] [address]    Report Status PENDING  Incomplete    Radiology Studies: ECHOCARDIOGRAM COMPLETE  Result Date: 09/01/2023    ECHOCARDIOGRAM REPORT   Patient Name:   Dan Sanchez Date of Exam: 09/01/2023 Medical Rec #:  [id number]      Height:       70.0 in Accession #:    [id number]     Weight:       175.0 lb Date of Birth:  [birth year]      BSA:          1.972 m Patient Age:    67 years       BP:           138/90 mmHg Patient Gender: M              HR:           80 bpm. Exam Location:  Inpatient Procedure: 2D Echo, 3D Echo, Cardiac Doppler and Color  Doppler Indications:    Bacteremia R78.81  History:        Patient has no prior history of Echocardiogram examinations.                 CAD, Signs/Symptoms:Chest Pain; Risk Factors:Hypertension and                 Dyslipidemia.  Sonographer:    Dan Sanchez RCS Referring Phys: [id number] Dan Sanchez Dan Sanchez IMPRESSIONS  1. Left ventricular ejection fraction, by estimation, is 60 to 65%. Left ventricular ejection fraction by 3D volume is 67 %. The left ventricle has normal function. The left ventricle has no regional wall motion abnormalities. Left ventricular diastolic  parameters are indeterminate.  2. Right ventricular systolic function is normal. The right ventricular size is normal.  3. The mitral valve is normal in structure. Trivial mitral valve regurgitation. No evidence of mitral stenosis.  4. The aortic  PROGRESS NOTE  Dan Sanchez UJW:[id number] DOB: 1956-03-22   PCP: Dan Hatch, MD  Patient is from: Home.  Independently ambulates at baseline.  DOA: 08/29/2023 LOS: 4  Chief complaints Chief Complaint  Patient presents with   Extremity Weakness    Bilateral      Brief Narrative / Interim history: 67 year old M with PMH of HTN and HLD presenting with back pain, leg swelling and weakness, night sweat and unintentional weight loss, and admitted with lumbar septic arthritis, dorsal epidural abscesses right psoas and bilateral paraspinous muscle myositis with multiple Sanchez abscesses, and severe spinal canal stenosis at L3-4 and L4-5 as noted on MRI lumbar spine.  Blood cultures obtained.  Started on vancomycin and ceftriaxone.  Neurosurgery consulted.  The next day, patient underwent L3-4, L4-5 open laminectomy and evacuation of epidural abscess. ID consulted.  Blood culture with MSSA.  Repeat blood culture NGTD.  Antibiotic de-escalated to IV Ancef.  TTE without significant finding.  TEE planned for 10/7.     Subjective: Seen and examined earlier this morning.  No major events overnight of this morning other than difficulty sleeping.  Felt hot last night although his temperature was okay.  Has not a bowel movement yet.  Objective: Vitals:   09/01/23 1543 09/01/23 2146 09/02/23 0508 09/02/23 0821  BP: 132/77 135/73 (!) 146/73 (!) 142/83  Pulse: 91 81 79 94  Resp:    16  Temp: 99.2 F (37.3 C) 98.3 F (36.8 C) 98.2 F (36.8 C) 98.8 F (37.1 C)  TempSrc: Oral Oral Oral Oral  SpO2: 97% 93% 97% 95%  Weight:      Height:        Examination:  GENERAL: No apparent distress.  Nontoxic. HEENT: MMM.  Vision and hearing grossly intact.  NECK: Supple.  No apparent JVD.  RESP:  No IWOB.  Fair aeration bilaterally. CVS:  RRR. Heart sounds normal.  ABD/GI/GU: BS+. Abd soft, NTND.  MSK/EXT:  Moves extremities. No apparent deformity. No edema.  SKIN: Surgical incision  DCI. NEURO: Awake, alert and oriented appropriately.  No apparent focal neuro deficit.  5/5 in all extremities. PSYCH: Calm. Normal affect.   Procedures:  10/2-L3-4 and L4-5 open laminectomy and evacuation of epidural abscess by Dr. Maurice Sanchez  Microbiology summarized: 10/1-blood cultures MSSA 10/2-epidural abscess culture with abundant Staph aureus. 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 Maurice Sanchez.  -CTX and vancomycin 10/1> Ancef 10/3>> -Repeat blood culture NGTD. -TTE without significant finding. -TEE planned for 10/7. -ID following   Elevated LFTs: Mild.  Likely due to the above.  Improving. -Continue to monitor   Hypertension: Normotensive for most part. -Continue home amlodipine.   Normocytic anemia: Stable.  Morning labs pending   Hyperlipidemia: -Continue home statin.  Constipation -Continue scheduled bowel regimen until he has bowel movement  Severe malnutrition: POA Body mass index is 25.11 kg/m. Nutrition Problem: Severe Malnutrition Etiology: chronic illness (epidural abscess, septic arthritis) Signs/Symptoms: percent weight loss, energy intake <

## 2023-09-02 NOTE — Progress Notes (Signed)
Providing Compassionate, Quality Care - Together   Subjective: Patient reports soreness at his back that is better today.  Objective: Vital signs in last 24 hours: Temp:  [98.2 F (36.8 C)-99.2 F (37.3 C)] 98.8 F (37.1 C) (10/05 0821) Pulse Rate:  [79-94] 94 (10/05 0821) Resp:  [16] 16 (10/05 0821) BP: (132-146)/(73-83) 142/83 (10/05 0821) SpO2:  [93 %-97 %] 95 % (10/05 0821)  Intake/Output from previous day: 10/04 0701 - 10/05 0700 In: 600 [IV Piggyback:600] Out: -  Intake/Output this shift: No intake/output data recorded.  Alert and oriented x 4 PERRLA CN II-XII grossly intact MAE, Strength and sensation intact Dermabond, incision is clean, dry, and intact   Lab Results: Recent Labs    09/01/23 0339 09/02/23 0949  WBC 10.7* 9.7  HGB 11.2* 11.1*  HCT 34.9* 34.7*  PLT 473* 470*   BMET Recent Labs    08/31/23 0519 09/01/23 0339  NA 135 136  K 3.9 3.9  CL 100 101  CO2 21* 25  GLUCOSE 144* 115*  BUN 14 16  CREATININE 0.80 0.89  CALCIUM 8.3* 8.2*    Studies/Results: ECHOCARDIOGRAM COMPLETE  Result Date: 09/01/2023    ECHOCARDIOGRAM REPORT   Patient Name:   Dan Sanchez Date of Exam: 09/01/2023 Medical Rec #:  [id number]      Height:       70.0 in Accession #:    [id number]     Weight:       175.0 lb Date of Birth:  Dec 25, 1955      BSA:          1.972 m Patient Age:    67 years       BP:           138/90 mmHg Patient Gender: M              HR:           80 bpm. Exam Location:  Inpatient Procedure: 2D Echo, 3D Echo, Cardiac Doppler and Color Doppler Indications:    Bacteremia R78.81  History:        Patient has no prior history of Echocardiogram examinations.                 CAD, Signs/Symptoms:Chest Pain; Risk Factors:Hypertension and                 Dyslipidemia.  Sonographer:    Lucendia Herrlich RCS Referring Phys: [id number] TAYE T GONFA IMPRESSIONS  1. Left ventricular ejection fraction, by estimation, is 60 to 65%. Left ventricular ejection fraction by 3D  volume is 67 %. The left ventricle has normal function. The left ventricle has no regional wall motion abnormalities. Left ventricular diastolic  parameters are indeterminate.  2. Right ventricular systolic function is normal. The right ventricular size is normal.  3. The mitral valve is normal in structure. Trivial mitral valve regurgitation. No evidence of mitral stenosis.  4. The aortic valve is normal in structure. Aortic valve regurgitation is not visualized. No aortic stenosis is present.  5. Hemangioma noted in the liver . The inferior vena cava is normal in size with greater than 50% respiratory variability, suggesting right atrial pressure of 3 mmHg. FINDINGS  Left Ventricle: Left ventricular ejection fraction, by estimation, is 60 to 65%. Left ventricular ejection fraction by 3D volume is 67 %. The left ventricle has normal function. The left ventricle has no regional wall motion abnormalities. The left ventricular internal cavity size was normal in size. There  is no left ventricular hypertrophy. Left ventricular diastolic parameters are indeterminate. Right Ventricle: The right ventricular size is normal. No increase in right ventricular wall thickness. Right ventricular systolic function is normal. Left Atrium: Left atrial size was normal in size. Right Atrium: Right atrial size was normal in size. Pericardium: There is no evidence of pericardial effusion. Mitral Valve: The mitral valve is normal in structure. Trivial mitral valve regurgitation. No evidence of mitral valve stenosis. Tricuspid Valve: The tricuspid valve is normal in structure. Tricuspid valve regurgitation is not demonstrated. No evidence of tricuspid stenosis. Aortic Valve: The aortic valve is normal in structure. Aortic valve regurgitation is not visualized. No aortic stenosis is present. Aortic valve peak gradient measures 8.8 mmHg. Pulmonic Valve: The pulmonic valve was normal in structure. Pulmonic valve regurgitation is not  visualized. No evidence of pulmonic stenosis. Aorta: The aortic root is normal in size and structure. Venous: Hemangioma noted in the liver. The inferior vena cava is normal in size with greater than 50% respiratory variability, suggesting right atrial pressure of 3 mmHg. IAS/Shunts: No atrial level shunt detected by color flow Doppler.  LEFT VENTRICLE PLAX 2D LVIDd:         4.70 cm         Diastology LVIDs:         2.35 cm         LV e' medial:    6.85 cm/s LV PW:         1.00 cm         LV E/e' medial:  8.7 LV IVS:        1.10 cm         LV e' lateral:   12.60 cm/s LVOT diam:     2.10 cm         LV E/e' lateral: 4.7 LV SV:         68 LV SV Index:   35 LVOT Area:     3.46 cm        3D Volume EF                                LV 3D EF:    Left                                             ventricul                                             ar                                             ejection                                             fraction  by 3D                                             volume is                                             67 %.                                 3D Volume EF:                                3D EF:        67 %                                LV EDV:       139 ml                                LV ESV:       46 ml                                LV SV:        93 ml RIGHT VENTRICLE             IVC RV S prime:     16.30 cm/s  IVC diam: 1.50 cm TAPSE (M-mode): 3.0 cm LEFT ATRIUM             Index        RIGHT ATRIUM           Index LA diam:        3.40 cm 1.72 cm/m   RA Area:     17.50 cm LA Vol (A2C):   45.3 ml 22.97 ml/m  RA Volume:   37.00 ml  18.76 ml/m LA Vol (A4C):   40.4 ml 20.49 ml/m LA Biplane Vol: 44.9 ml 22.77 ml/m  AORTIC VALVE AV Area (Vmax): 2.48 cm AV Vmax:        148.00 cm/s AV Peak Grad:   8.8 mmHg LVOT Vmax:      106.00 cm/s LVOT Vmean:     67.667 cm/s LVOT VTI:       0.197 m  AORTA Ao Root diam: 3.60 cm Ao Asc  diam:  3.50 cm MITRAL VALVE               TRICUSPID VALVE MV Area (PHT): 3.72 cm    TR Peak grad:   17.5 mmHg MV Decel Time: 204 msec    TR Vmax:        209.00 cm/s MR Peak grad: 82.4 mmHg MR Vmax:      454.00 cm/s  SHUNTS MV E velocity: 59.70 cm/s  Systemic VTI:  0.20 m MV A velocity: 68.40 cm/s  Systemic Diam: 2.10 cm MV E/A ratio:  0.87 Kardie Tobb DO Electronically signed by Thomasene Ripple DO Signature Date/Time: 09/01/2023/1:22:12 PM    Final  Assessment/Plan: Patient underwent laminotomy for evacuation of epidural abscess by Dr. Maurice Small on 08/30/2023. Culture grew Staph aureus. Patient on Ancef.   LOS: 4 days   -PICC placement today if still no growth on blood cultures per ID -TEE Monday per ID, if no endocarditis, can d/c home   Val Eagle, DNP, AGNP-C Nurse Practitioner  [facility] [address], [place], [place] [address] P: [phone]    F: [phone]  09/02/2023, 10:57 AM

## 2023-09-02 NOTE — Progress Notes (Signed)
Mobility Specialist: Progress Note   09/02/23 1259  Mobility  Activity Ambulated independently in hallway  Level of Assistance Independent  Assistive Device None  Distance Ambulated (ft) 450 ft  Activity Response Tolerated well  Mobility Referral Yes  $Mobility charge 1 Mobility  Mobility Specialist Start Time (ACUTE ONLY) 1054  Mobility Specialist Stop Time (ACUTE ONLY) 1101  Mobility Specialist Time Calculation (min) (ACUTE ONLY) 7 min    Pt was agreeable to mobility session - received in bed. Had c/o back soreness but otherwise asymptomatic throughout. Returned to room without fault. Left ambulating on EOB with all needs met, call bell in reach.   Maurene Capes Mobility Specialist Please contact via SecureChat or Rehab office at [phone]

## 2023-09-02 NOTE — Plan of Care (Signed)

## 2023-09-03 DIAGNOSIS — M4656 Other infective spondylopathies, lumbar region: Secondary | ICD-10-CM | POA: Diagnosis not present

## 2023-09-03 DIAGNOSIS — R7989 Other specified abnormal findings of blood chemistry: Secondary | ICD-10-CM | POA: Diagnosis not present

## 2023-09-03 DIAGNOSIS — E7800 Pure hypercholesterolemia, unspecified: Secondary | ICD-10-CM | POA: Diagnosis not present

## 2023-09-03 DIAGNOSIS — G061 Intraspinal abscess and granuloma: Secondary | ICD-10-CM | POA: Diagnosis not present

## 2023-09-03 LAB — CULTURE, BLOOD (ROUTINE X 2)
Culture: NO GROWTH
Special Requests: ADEQUATE

## 2023-09-03 MED ORDER — CHLORHEXIDINE GLUCONATE CLOTH 2 % EX PADS
6.0000 | MEDICATED_PAD | Freq: Every day | CUTANEOUS | Status: DC
  Administered 2023-09-03: 6 via TOPICAL

## 2023-09-03 MED ORDER — LORAZEPAM 0.5 MG PO TABS
0.5000 mg | ORAL_TABLET | Freq: Three times a day (TID) | ORAL | Status: DC | PRN
  Administered 2023-09-03 – 2023-09-04 (×3): 0.5 mg via ORAL
  Filled 2023-09-03 (×3): qty 1

## 2023-09-03 MED ORDER — SODIUM CHLORIDE 0.9% FLUSH
10.0000 mL | Freq: Two times a day (BID) | INTRAVENOUS | Status: DC
  Administered 2023-09-03 (×2): 10 mL

## 2023-09-03 MED ORDER — SODIUM CHLORIDE 0.9% FLUSH: 10.0000 mL | INTRAVENOUS | Status: DC | PRN

## 2023-09-03 NOTE — Progress Notes (Signed)
PROGRESS NOTE  Dan BOTBYL ZOX:[id number] DOB: 06-03-1956   PCP: Sheliah Hatch, MD  Patient is from: Home.  Independently ambulates at baseline.  DOA: 08/29/2023 LOS: 5  Chief complaints Chief Complaint  Patient presents with   Extremity Weakness    Bilateral      Brief Narrative / Interim history: 67 year old M with PMH of HTN and HLD presenting with back pain, leg swelling and weakness, night sweat and unintentional weight loss, and admitted with lumbar septic arthritis, dorsal epidural abscesses right psoas and bilateral paraspinous muscle myositis with multiple small abscesses, and severe spinal canal stenosis at L3-4 and L4-5 as noted on MRI lumbar spine.  Blood cultures obtained.  Started on vancomycin and ceftriaxone.  Neurosurgery consulted.  The next day, patient underwent L3-4, L4-5 open laminectomy and evacuation of epidural abscess. ID consulted.  Blood culture with MSSA.  Repeat blood culture NGTD.  Antibiotic de-escalated to IV Ancef.  TTE without significant finding.  TEE planned for 10/7.   Subjective: Seen and examined earlier this morning.  No major events overnight of this morning.  No complaints.  Anxious about PICC line and hospitalization.  Objective: Vitals:   09/02/23 1524 09/02/23 2118 09/03/23 0519 09/03/23 0804  BP: (!) 152/76 133/82 122/85   Pulse: 86 85 79 89  Resp: 16 18 16 17   Temp: 98.9 F (37.2 C) 99.6 F (37.6 C) 98.4 F (36.9 C) 98.7 F (37.1 C)  TempSrc: Oral   Oral  SpO2: 95% 97% 99% 98%  Weight:      Height:        Examination:  GENERAL: No apparent distress.  Nontoxic. HEENT: MMM.  Vision and hearing grossly intact.  NECK: Supple.  No apparent JVD.  RESP:  No IWOB.  Fair aeration bilaterally. CVS:  RRR. Heart sounds normal.  ABD/GI/GU: BS+. Abd soft, NTND.  MSK/EXT:  Moves extremities. No apparent deformity. No edema.  SKIN: Surgical incision DCI. NEURO: Awake, alert and oriented appropriately.  No apparent focal  neuro deficit.  5/5 in all extremities. PSYCH: Anxious.  Procedures:  10/2-L3-4 and L4-5 open laminectomy and evacuation of epidural abscess by Dr. Maurice Small  Microbiology summarized: 10/1-blood cultures MSSA 10/2-epidural abscess culture with abundant Staph aureus. 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 Maurice Small.  -CTX and vancomycin 10/1> Ancef 10/3>> -Repeat blood culture NGTD. -PICC line placed on 10/6. -TTE without significant finding. -TEE planned for 10/7. -Likely discharge with IV antibiotics on 10/7 after TEE   Elevated LFTs: Mild.  Likely due to the above.  Improved. -Continue to monitor   Hypertension: Normotensive for most part. -Continue home amlodipine.   Normocytic anemia: Stable.  Morning labs pending   Hyperlipidemia: -Continue home statin.  Constipation: Resolved. -Bowel regimen as needed  Anxiety: Was anxious about PICC line and hospitalization.  Somewhat tearful -Low-dose Ativan as needed  Severe malnutrition: POA Body mass index is 25.11 kg/m. Nutrition Problem: Severe Malnutrition Etiology: chronic illness (epidural abscess, septic  arthritis) Signs/Symptoms: percent weight loss, energy intake < or equal to 75% for > or equal to 1 month Percent weight loss: 11.3 % Interventions: Ensure Enlive (each supplement provides 350kcal and 20 grams of protein), MVI   DVT prophylaxis:  SCDs  Start: 08/29/23 2303  Code Status: Full code Family Communication: Patient's wife at bedside Level of care: Med-Surg Status is: Inpatient Remains inpatient appropriate because: MSSA bacteremia, septic arthritis, epidural abscess and severe canal stenosis   Final disposition: Likely home on 10/7. Consultants:  Neurosurgery Infectious disease  35 minutes with more than 50% spent in reviewing records, counseling patient/family and coordinating care.   Sch Meds:  Scheduled Meds:  amLODipine  10 mg Oral Daily   ascorbic acid  500 mg Oral Daily   Chlorhexidine Gluconate Cloth  6 each Topical Daily   feeding supplement  237 mL Oral BID BM   melatonin  6 mg Oral QHS   multivitamin with minerals  1 tablet Oral Daily   pantoprazole  40 mg Oral Daily   rosuvastatin  40 mg Oral Daily   sodium chloride flush  10-40 mL Intracatheter Q12H   sodium chloride flush  3 mL Intravenous Q12H   Continuous Infusions:   ceFAZolin (ANCEF) IV 2 g (09/03/23 0527)   PRN Meds:.HYDROcodone-acetaminophen, LORazepam, morphine injection, ondansetron **OR** ondansetron (ZOFRAN) IV, [COMPLETED] polyethylene glycol **FOLLOWED BY** [EXPIRED] polyethylene glycol **FOLLOWED BY** polyethylene glycol, [COMPLETED] senna-docusate **FOLLOWED BY** [COMPLETED] senna-docusate **FOLLOWED BY** senna-docusate, sodium chloride flush, traMADol  Antimicrobials: Anti-infectives (From admission, onward)    Start     Dose/Rate Route Frequency Ordered Stop   08/31/23 1400  ceFAZolin (ANCEF) IVPB 2g/100 mL premix        2 g 200 mL/hr over 30 Minutes Intravenous Every 8 hours 08/31/23 0935     08/30/23 1000  cefTRIAXone (ROCEPHIN) 2 g in sodium chloride 0.9 % 100 mL IVPB  Status:  Discontinued        2 g 200 mL/hr over 30 Minutes Intravenous Every 12 hours 08/29/23 2304 08/29/23 2314   08/30/23 1000  vancomycin (VANCOREADY) IVPB 1250 mg/250 mL  Status:  Discontinued        1,250 mg 166.7 mL/hr over 90 Minutes Intravenous Every  12 hours 08/30/23 0046 08/31/23 0935   08/30/23 0800  metroNIDAZOLE (FLAGYL) IVPB 500 mg  Status:  Discontinued        500 mg 100 mL/hr over 60 Minutes Intravenous Every 12 hours 08/30/23 0748 08/31/23 0935   08/29/23 2315  cefTRIAXone (ROCEPHIN) 2 g in sodium chloride 0.9 % 100 mL IVPB  Status:  Discontinued        2 g 200 mL/hr over 30 Minutes Intravenous Every 12 hours 08/29/23 2314 08/31/23 0935   08/29/23 2200  vancomycin (VANCOREADY) IVPB 1750 mg/350 mL        1,750 mg 175 mL/hr over 120 Minutes Intravenous  Once 08/29/23 2152 08/30/23 0054   08/29/23 2145  ceFEPIme (MAXIPIME) 2 g in sodium chloride 0.9 % 100 mL IVPB        2 g 200 mL/hr over 30 Minutes Intravenous  Once 08/29/23 2139 08/29/23 2252        I have personally reviewed the following labs and images: CBC: Recent Labs  Lab 08/28/23 1138 08/29/23 2205 08/30/23 0523 08/31/23 0519 09/01/23 0339 09/02/23 0949  WBC 9.8 10.4 10.3 9.6 10.7* 9.7  NEUTROABS 8.2* 8.4*  --   --   --   --   HGB 11.5* 10.6* 11.7* 10.8*  11.2* 11.1*  HCT 34.3* 32.6* 35.2* 32.8* 34.9* 34.7*  MCV 87.2 86.0 88.4 86.1 87.5 89.4  PLT 429.0* 357 377 384 473* 470*   BMP &GFR Recent Labs  Lab 08/29/23 1600 08/30/23 0523 08/31/23 0519 09/01/23 0339  NA 132* 134* 135 136  K 3.7 3.8 3.9 3.9  CL 98 103 100 101  CO2 23 21* 21* 25  GLUCOSE 110* 102* 144* 115*  BUN 12 12 14 16   CREATININE 0.76 0.90 0.80 0.89  CALCIUM 8.4* 8.7* 8.3* 8.2*  MG 2.1  --  2.3 2.2  PHOS  --   --  4.5  --    Estimated Creatinine Clearance: 85.4 mL/min (by C-G formula based on SCr of 0.89 mg/dL). Liver & Pancreas: Recent Labs  Lab 08/29/23 1600 08/30/23 0523 08/31/23 0519 09/01/23 0339 09/02/23 0949  AST 37 24 44* 31 47*  ALT 141* 116* 110* 96* 66*  ALKPHOS 134* 125 112 107 107  BILITOT 0.5 0.7 0.4 0.5 0.2*  PROT 6.8 6.8 6.4* 6.3* 6.4*  ALBUMIN 2.5* 2.3* 2.0* 2.1* 2.2*   No results for input(s): "LIPASE", "AMYLASE" in the last 168 hours. No results for  input(s): "AMMONIA" in the last 168 hours. Diabetic: No results for input(s): "HGBA1C" in the last 72 hours. No results for input(s): "GLUCAP" in the last 168 hours. Cardiac Enzymes: No results for input(s): "CKTOTAL", "CKMB", "CKMBINDEX", "TROPONINI" in the last 168 hours. No results for input(s): "PROBNP" in the last 8760 hours. Coagulation Profile: No results for input(s): "INR", "PROTIME" in the last 168 hours. Thyroid Function Tests: No results for input(s): "TSH", "T4TOTAL", "FREET4", "T3FREE", "THYROIDAB" in the last 72 hours.  Lipid Profile: No results for input(s): "CHOL", "HDL", "LDLCALC", "TRIG", "CHOLHDL", "LDLDIRECT" in the last 72 hours. Anemia Panel: No results for input(s): "VITAMINB12", "FOLATE", "FERRITIN", "TIBC", "IRON", "RETICCTPCT" in the last 72 hours.  Urine analysis: No results found for: "COLORURINE", "APPEARANCEUR", "LABSPEC", "PHURINE", "GLUCOSEU", "HGBUR", "BILIRUBINUR", "KETONESUR", "PROTEINUR", "UROBILINOGEN", "NITRITE", "LEUKOCYTESUR" Sepsis Labs: Invalid input(s): "PROCALCITONIN", "LACTICIDVEN"  Microbiology: Recent Results (from the past 240 hour(s))  Blood culture (routine x 2)     Status: None   Collection Time: 08/29/23 10:05 PM   Specimen: BLOOD  Result Value Ref Range Status   Specimen Description BLOOD SITE NOT SPECIFIED  Final   Special Requests   Final    BOTTLES DRAWN AEROBIC AND ANAEROBIC Blood Culture adequate volume   Culture   Final    NO GROWTH 5 DAYS Performed at [facility], [address], [place], [place] [address]    Report Status 09/03/2023 FINAL  Final  Blood culture (routine x 2)     Status: Abnormal (Preliminary result)   Collection Time: 08/29/23 10:07 PM   Specimen: BLOOD  Result Value Ref Range Status   Specimen Description BLOOD SITE NOT SPECIFIED  Final   Special Requests   Final    BOTTLES DRAWN AEROBIC AND ANAEROBIC Blood Culture adequate volume   Culture  Setup Time   Final    GRAM POSITIVE COCCI IN  CLUSTERS ANAEROBIC BOTTLE ONLY CRITICAL RESULT CALLED TO, READ BACK BY AND VERIFIED WITH: PHARMD J. LEDFORD 08/31/23 @ 0545 BY AB    Culture (A)  Final    STAPHYLOCOCCUS AUREUS SENDING OUT FOR SUSCEPTIBILITIES Performed at [facility], [address], [place], [place] [address]    Report Status PENDING  Incomplete  Blood Culture ID Panel (Reflexed)     Status: Abnormal   Collection Time: 08/29/23  10:07 PM  Result Value Ref Range Status   Enterococcus faecalis NOT DETECTED NOT DETECTED Final   Enterococcus Faecium NOT DETECTED NOT DETECTED Final   Listeria monocytogenes NOT DETECTED NOT DETECTED Final   Staphylococcus species DETECTED (A) NOT DETECTED Final    Comment: CRITICAL RESULT CALLED TO, READ BACK BY AND VERIFIED WITH: PHARMD J. LEDFORD 08/31/23 @ 0545 BY AB    Staphylococcus aureus (BCID) DETECTED (A) NOT DETECTED Final    Comment: CRITICAL RESULT CALLED TO, READ BACK BY AND VERIFIED WITH: PHARMD J. LEDFORD 08/31/23 @ 0545 BY AB    Staphylococcus epidermidis NOT DETECTED NOT DETECTED Final   Staphylococcus lugdunensis NOT DETECTED NOT DETECTED Final   Streptococcus species NOT DETECTED NOT DETECTED Final   Streptococcus agalactiae NOT DETECTED NOT DETECTED Final   Streptococcus pneumoniae NOT DETECTED NOT DETECTED Final   Streptococcus pyogenes NOT DETECTED NOT DETECTED Final   A.calcoaceticus-baumannii NOT DETECTED NOT DETECTED Final   Bacteroides fragilis NOT DETECTED NOT DETECTED Final   Enterobacterales NOT DETECTED NOT DETECTED Final   Enterobacter cloacae complex NOT DETECTED NOT DETECTED Final   Escherichia coli NOT DETECTED NOT DETECTED Final   Klebsiella aerogenes NOT DETECTED NOT DETECTED Final   Klebsiella oxytoca NOT DETECTED NOT DETECTED Final   Klebsiella pneumoniae NOT DETECTED NOT DETECTED Final   Proteus species NOT DETECTED NOT DETECTED Final   Salmonella species NOT DETECTED NOT DETECTED Final   Serratia marcescens NOT DETECTED NOT DETECTED  Final   Haemophilus influenzae NOT DETECTED NOT DETECTED Final   Neisseria meningitidis NOT DETECTED NOT DETECTED Final   Pseudomonas aeruginosa NOT DETECTED NOT DETECTED Final   Stenotrophomonas maltophilia NOT DETECTED NOT DETECTED Final   Candida albicans NOT DETECTED NOT DETECTED Final   Candida auris NOT DETECTED NOT DETECTED Final   Candida glabrata NOT DETECTED NOT DETECTED Final   Candida krusei NOT DETECTED NOT DETECTED Final   Candida parapsilosis NOT DETECTED NOT DETECTED Final   Candida tropicalis NOT DETECTED NOT DETECTED Final   Cryptococcus neoformans/gattii NOT DETECTED NOT DETECTED Final   Meth resistant mecA/C and MREJ NOT DETECTED NOT DETECTED Final    Comment: Performed at [facility], [address], [place], [place] [address]  Surgical pcr screen     Status: Abnormal   Collection Time: 08/30/23  7:34 AM   Specimen: Nasal Mucosa; Nasal Swab  Result Value Ref Range Status   MRSA, PCR NEGATIVE NEGATIVE Final   Staphylococcus aureus POSITIVE (A) NEGATIVE Final    Comment: (NOTE) The Xpert SA Assay (FDA approved for NASAL specimens in patients 1 years of age and older), is one component of a comprehensive surveillance program. It is not intended to diagnose infection nor to guide or monitor treatment. Performed at [facility], [address], [place], [place] [address]   Aerobic/Anaerobic Culture w Gram Stain (surgical/deep wound)     Status: None (Preliminary result)   Collection Time: 08/30/23  4:01 PM   Specimen: Wound; Abscess  Result Value Ref Range Status   Specimen Description ABSCESS  Final   Special Requests EPIDURAL  Final   Gram Stain   Final    FEW WBC PRESENT, PREDOMINANTLY PMN RARE GRAM POSITIVE COCCI Performed at [facility], [address] [place], [place] [address]    Culture   Final    ABUNDANT STAPHYLOCOCCUS AUREUS NO ANAEROBES ISOLATED; CULTURE IN PROGRESS FOR 5 DAYS    Report Status PENDING  Incomplete   Organism  ID,  Bacteria STAPHYLOCOCCUS AUREUS  Final      Susceptibility   Staphylococcus aureus - MIC*    CIPROFLOXACIN <=0.5 SENSITIVE Sensitive     ERYTHROMYCIN <=0.25 SENSITIVE Sensitive     GENTAMICIN <=0.5 SENSITIVE Sensitive     OXACILLIN 0.5 SENSITIVE Sensitive     TETRACYCLINE <=1 SENSITIVE Sensitive     VANCOMYCIN 1 SENSITIVE Sensitive     TRIMETH/SULFA <=10 SENSITIVE Sensitive     CLINDAMYCIN <=0.25 SENSITIVE Sensitive     RIFAMPIN <=0.5 SENSITIVE Sensitive     Inducible Clindamycin NEGATIVE Sensitive     LINEZOLID 2 SENSITIVE Sensitive     * ABUNDANT STAPHYLOCOCCUS AUREUS  Culture, blood (Routine X 2) w Reflex to ID Panel     Status: None (Preliminary result)   Collection Time: 08/31/23  9:11 AM   Specimen: BLOOD LEFT HAND  Result Value Ref Range Status   Specimen Description BLOOD LEFT HAND  Final   Special Requests   Final    BOTTLES DRAWN AEROBIC AND ANAEROBIC Blood Culture adequate volume   Culture   Final    NO GROWTH 3 DAYS Performed at [facility], [address], [place], [place] [address]    Report Status PENDING  Incomplete  Culture, blood (Routine X 2) w Reflex to ID Panel     Status: None (Preliminary result)   Collection Time: 08/31/23  9:11 AM   Specimen: BLOOD RIGHT HAND  Result Value Ref Range Status   Specimen Description BLOOD RIGHT HAND  Final   Special Requests   Final    BOTTLES DRAWN AEROBIC AND ANAEROBIC Blood Culture adequate volume   Culture   Final    NO GROWTH 3 DAYS Performed at [facility], [address] [place], [place] [address]    Report Status PENDING  Incomplete    Radiology Studies: [place] EKG SITE RITE  Result Date: 09/02/2023 If Site Rite image not attached, placement could not be confirmed due to current cardiac rhythm.     Garfield Coiner T. Kambrea Carrasco [facility]  If 7PM-7AM, please contact night-coverage [email or web address] 09/03/2023, 12:06 PM

## 2023-09-03 NOTE — Plan of Care (Signed)
  Problem: Activity: Goal: Risk for activity intolerance will decrease Outcome: Progressing   Problem: Coping: Goal: Level of anxiety will decrease Outcome: Progressing   Problem: Skin Integrity: Goal: Risk for impaired skin integrity will decrease Outcome: Progressing   

## 2023-09-03 NOTE — Progress Notes (Signed)
Peripherally Inserted Central Catheter Placement  The IV Nurse has discussed with the patient and/or persons authorized to consent for the patient, the purpose of this procedure and the potential benefits and risks involved with this procedure.  The benefits include less needle sticks, lab draws from the catheter, and the patient may be discharged home with the catheter. Risks include, but not limited to, infection, bleeding, blood clot (thrombus formation), and puncture of an artery; nerve damage and irregular heartbeat and possibility to perform a PICC exchange if needed/ordered by physician.  Alternatives to this procedure were also discussed.  Bard Power PICC patient education guide, fact sheet on infection prevention and patient information card has been provided to patient /or left at bedside.    PICC Placement Documentation  PICC Single Lumen 09/03/23 Right Basilic 37 cm 0 cm (Active)  Indication for Insertion or Continuance of Line Home intravenous therapies (PICC only) 09/03/23 1149  Exposed Catheter (cm) 0 cm 09/03/23 1149  Site Assessment Clean, Dry, Intact 09/03/23 1149  Line Status Saline locked;Blood return noted 09/03/23 1149  Dressing Type Transparent;Securing device 09/03/23 1149  Dressing Status Antimicrobial disc in place;Clean, Dry, Intact 09/03/23 1149  Line Care Connections checked and tightened 09/03/23 1149  Line Adjustment (NICU/IV Team Only) No 09/03/23 1149  Dressing Intervention New dressing;Adhesive placed at insertion site (IV team only) 09/03/23 1149  Dressing Change Due 09/10/23 09/03/23 1149       Burnard Bunting Chenice 09/03/2023, 11:50 AM

## 2023-09-03 NOTE — H&P (View-Only) (Signed)
PROGRESS NOTE  SAMER BOTBYL ZOX:[id number] DOB: 06-03-1956   PCP: Sheliah Hatch, MD  Patient is from: Home.  Independently ambulates at baseline.  DOA: 08/29/2023 LOS: 5  Chief complaints Chief Complaint  Patient presents with   Extremity Weakness    Bilateral      Brief Narrative / Interim history: 67 year old M with PMH of HTN and HLD presenting with back pain, leg swelling and weakness, night sweat and unintentional weight loss, and admitted with lumbar septic arthritis, dorsal epidural abscesses right psoas and bilateral paraspinous muscle myositis with multiple small abscesses, and severe spinal canal stenosis at L3-4 and L4-5 as noted on MRI lumbar spine.  Blood cultures obtained.  Started on vancomycin and ceftriaxone.  Neurosurgery consulted.  The next day, patient underwent L3-4, L4-5 open laminectomy and evacuation of epidural abscess. ID consulted.  Blood culture with MSSA.  Repeat blood culture NGTD.  Antibiotic de-escalated to IV Ancef.  TTE without significant finding.  TEE planned for 10/7.   Subjective: Seen and examined earlier this morning.  No major events overnight of this morning.  No complaints.  Anxious about PICC line and hospitalization.  Objective: Vitals:   09/02/23 1524 09/02/23 2118 09/03/23 0519 09/03/23 0804  BP: (!) 152/76 133/82 122/85   Pulse: 86 85 79 89  Resp: 16 18 16 17   Temp: 98.9 F (37.2 C) 99.6 F (37.6 C) 98.4 F (36.9 C) 98.7 F (37.1 C)  TempSrc: Oral   Oral  SpO2: 95% 97% 99% 98%  Weight:      Height:        Examination:  GENERAL: No apparent distress.  Nontoxic. HEENT: MMM.  Vision and hearing grossly intact.  NECK: Supple.  No apparent JVD.  RESP:  No IWOB.  Fair aeration bilaterally. CVS:  RRR. Heart sounds normal.  ABD/GI/GU: BS+. Abd soft, NTND.  MSK/EXT:  Moves extremities. No apparent deformity. No edema.  SKIN: Surgical incision DCI. NEURO: Awake, alert and oriented appropriately.  No apparent focal  neuro deficit.  5/5 in all extremities. PSYCH: Anxious.  Procedures:  10/2-L3-4 and L4-5 open laminectomy and evacuation of epidural abscess by Dr. Maurice Small  Microbiology summarized: 10/1-blood cultures MSSA 10/2-epidural abscess culture with abundant Staph aureus. 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 Maurice Small.  -CTX and vancomycin 10/1> Ancef 10/3>> -Repeat blood culture NGTD. -PICC line placed on 10/6. -TTE without significant finding. -TEE planned for 10/7. -Likely discharge with IV antibiotics on 10/7 after TEE   Elevated LFTs: Mild.  Likely due to the above.  Improved. -Continue to monitor   Hypertension: Normotensive for most part. -Continue home amlodipine.   Normocytic anemia: Stable.  Morning labs pending   Hyperlipidemia: -Continue home statin.  Constipation: Resolved. -Bowel regimen as needed  Anxiety: Was anxious about PICC line and hospitalization.  Somewhat tearful -Low-dose Ativan as needed  Severe malnutrition: POA Body mass index is 25.11 kg/m. Nutrition Problem: Severe Malnutrition Etiology: chronic illness (epidural abscess, septic  arthritis) Signs/Symptoms: percent weight loss, energy intake < or equal to 75% for > or equal to 1 month Percent weight loss: 11.3 % Interventions: Ensure Enlive (each supplement provides 350kcal and 20 grams of protein), MVI   DVT prophylaxis:  SCDs  Start: 08/29/23 2303  Code Status: Full code Family Communication: Patient's wife at bedside Level of care: Med-Surg Status is: Inpatient Remains inpatient appropriate because: MSSA bacteremia, septic arthritis, epidural abscess and severe canal stenosis   Final disposition: Likely home on 10/7. Consultants:  Neurosurgery Infectious disease  35 minutes with more than 50% spent in reviewing records, counseling patient/family and coordinating care.   Sch Meds:  Scheduled Meds:  amLODipine  10 mg Oral Daily   ascorbic acid  500 mg Oral Daily   Chlorhexidine Gluconate Cloth  6 each Topical Daily   feeding supplement  237 mL Oral BID BM   melatonin  6 mg Oral QHS   multivitamin with minerals  1 tablet Oral Daily   pantoprazole  40 mg Oral Daily   rosuvastatin  40 mg Oral Daily   sodium chloride flush  10-40 mL Intracatheter Q12H   sodium chloride flush  3 mL Intravenous Q12H   Continuous Infusions:   ceFAZolin (ANCEF) IV 2 g (09/03/23 0527)   PRN Meds:.HYDROcodone-acetaminophen, LORazepam, morphine injection, ondansetron **OR** ondansetron (ZOFRAN) IV, [COMPLETED] polyethylene glycol **FOLLOWED BY** [EXPIRED] polyethylene glycol **FOLLOWED BY** polyethylene glycol, [COMPLETED] senna-docusate **FOLLOWED BY** [COMPLETED] senna-docusate **FOLLOWED BY** senna-docusate, sodium chloride flush, traMADol  Antimicrobials: Anti-infectives (From admission, onward)    Start     Dose/Rate Route Frequency Ordered Stop   08/31/23 1400  ceFAZolin (ANCEF) IVPB 2g/100 mL premix        2 g 200 mL/hr over 30 Minutes Intravenous Every 8 hours 08/31/23 0935     08/30/23 1000  cefTRIAXone (ROCEPHIN) 2 g in sodium chloride 0.9 % 100 mL IVPB  Status:  Discontinued        2 g 200 mL/hr over 30 Minutes Intravenous Every 12 hours 08/29/23 2304 08/29/23 2314   08/30/23 1000  vancomycin (VANCOREADY) IVPB 1250 mg/250 mL  Status:  Discontinued        1,250 mg 166.7 mL/hr over 90 Minutes Intravenous Every  12 hours 08/30/23 0046 08/31/23 0935   08/30/23 0800  metroNIDAZOLE (FLAGYL) IVPB 500 mg  Status:  Discontinued        500 mg 100 mL/hr over 60 Minutes Intravenous Every 12 hours 08/30/23 0748 08/31/23 0935   08/29/23 2315  cefTRIAXone (ROCEPHIN) 2 g in sodium chloride 0.9 % 100 mL IVPB  Status:  Discontinued        2 g 200 mL/hr over 30 Minutes Intravenous Every 12 hours 08/29/23 2314 08/31/23 0935   08/29/23 2200  vancomycin (VANCOREADY) IVPB 1750 mg/350 mL        1,750 mg 175 mL/hr over 120 Minutes Intravenous  Once 08/29/23 2152 08/30/23 0054   08/29/23 2145  ceFEPIme (MAXIPIME) 2 g in sodium chloride 0.9 % 100 mL IVPB        2 g 200 mL/hr over 30 Minutes Intravenous  Once 08/29/23 2139 08/29/23 2252        I have personally reviewed the following labs and images: CBC: Recent Labs  Lab 08/28/23 1138 08/29/23 2205 08/30/23 0523 08/31/23 0519 09/01/23 0339 09/02/23 0949  WBC 9.8 10.4 10.3 9.6 10.7* 9.7  NEUTROABS 8.2* 8.4*  --   --   --   --   HGB 11.5* 10.6* 11.7* 10.8*  11.2* 11.1*  HCT 34.3* 32.6* 35.2* 32.8* 34.9* 34.7*  MCV 87.2 86.0 88.4 86.1 87.5 89.4  PLT 429.0* 357 377 384 473* 470*   BMP &GFR Recent Labs  Lab 08/29/23 1600 08/30/23 0523 08/31/23 0519 09/01/23 0339  NA 132* 134* 135 136  K 3.7 3.8 3.9 3.9  CL 98 103 100 101  CO2 23 21* 21* 25  GLUCOSE 110* 102* 144* 115*  BUN 12 12 14 16   CREATININE 0.76 0.90 0.80 0.89  CALCIUM 8.4* 8.7* 8.3* 8.2*  MG 2.1  --  2.3 2.2  PHOS  --   --  4.5  --    Estimated Creatinine Clearance: 85.4 mL/min (by C-G formula based on SCr of 0.89 mg/dL). Liver & Pancreas: Recent Labs  Lab 08/29/23 1600 08/30/23 0523 08/31/23 0519 09/01/23 0339 09/02/23 0949  AST 37 24 44* 31 47*  ALT 141* 116* 110* 96* 66*  ALKPHOS 134* 125 112 107 107  BILITOT 0.5 0.7 0.4 0.5 0.2*  PROT 6.8 6.8 6.4* 6.3* 6.4*  ALBUMIN 2.5* 2.3* 2.0* 2.1* 2.2*   No results for input(s): "LIPASE", "AMYLASE" in the last 168 hours. No results for  input(s): "AMMONIA" in the last 168 hours. Diabetic: No results for input(s): "HGBA1C" in the last 72 hours. No results for input(s): "GLUCAP" in the last 168 hours. Cardiac Enzymes: No results for input(s): "CKTOTAL", "CKMB", "CKMBINDEX", "TROPONINI" in the last 168 hours. No results for input(s): "PROBNP" in the last 8760 hours. Coagulation Profile: No results for input(s): "INR", "PROTIME" in the last 168 hours. Thyroid Function Tests: No results for input(s): "TSH", "T4TOTAL", "FREET4", "T3FREE", "THYROIDAB" in the last 72 hours.  Lipid Profile: No results for input(s): "CHOL", "HDL", "LDLCALC", "TRIG", "CHOLHDL", "LDLDIRECT" in the last 72 hours. Anemia Panel: No results for input(s): "VITAMINB12", "FOLATE", "FERRITIN", "TIBC", "IRON", "RETICCTPCT" in the last 72 hours.  Urine analysis: No results found for: "COLORURINE", "APPEARANCEUR", "LABSPEC", "PHURINE", "GLUCOSEU", "HGBUR", "BILIRUBINUR", "KETONESUR", "PROTEINUR", "UROBILINOGEN", "NITRITE", "LEUKOCYTESUR" Sepsis Labs: Invalid input(s): "PROCALCITONIN", "LACTICIDVEN"  Microbiology: Recent Results (from the past 240 hour(s))  Blood culture (routine x 2)     Status: None   Collection Time: 08/29/23 10:05 PM   Specimen: BLOOD  Result Value Ref Range Status   Specimen Description BLOOD SITE NOT SPECIFIED  Final   Special Requests   Final    BOTTLES DRAWN AEROBIC AND ANAEROBIC Blood Culture adequate volume   Culture   Final    NO GROWTH 5 DAYS Performed at [facility], [address], [place], [place] [address]    Report Status 09/03/2023 FINAL  Final  Blood culture (routine x 2)     Status: Abnormal (Preliminary result)   Collection Time: 08/29/23 10:07 PM   Specimen: BLOOD  Result Value Ref Range Status   Specimen Description BLOOD SITE NOT SPECIFIED  Final   Special Requests   Final    BOTTLES DRAWN AEROBIC AND ANAEROBIC Blood Culture adequate volume   Culture  Setup Time   Final    GRAM POSITIVE COCCI IN  CLUSTERS ANAEROBIC BOTTLE ONLY CRITICAL RESULT CALLED TO, READ BACK BY AND VERIFIED WITH: PHARMD J. LEDFORD 08/31/23 @ 0545 BY AB    Culture (A)  Final    STAPHYLOCOCCUS AUREUS SENDING OUT FOR SUSCEPTIBILITIES Performed at [facility], [address], [place], [place] [address]    Report Status PENDING  Incomplete  Blood Culture ID Panel (Reflexed)     Status: Abnormal   Collection Time: 08/29/23  10:07 PM  Result Value Ref Range Status   Enterococcus faecalis NOT DETECTED NOT DETECTED Final   Enterococcus Faecium NOT DETECTED NOT DETECTED Final   Listeria monocytogenes NOT DETECTED NOT DETECTED Final   Staphylococcus species DETECTED (A) NOT DETECTED Final    Comment: CRITICAL RESULT CALLED TO, READ BACK BY AND VERIFIED WITH: PHARMD J. LEDFORD 08/31/23 @ 0545 BY AB    Staphylococcus aureus (BCID) DETECTED (A) NOT DETECTED Final    Comment: CRITICAL RESULT CALLED TO, READ BACK BY AND VERIFIED WITH: PHARMD J. LEDFORD 08/31/23 @ 0545 BY AB    Staphylococcus epidermidis NOT DETECTED NOT DETECTED Final   Staphylococcus lugdunensis NOT DETECTED NOT DETECTED Final   Streptococcus species NOT DETECTED NOT DETECTED Final   Streptococcus agalactiae NOT DETECTED NOT DETECTED Final   Streptococcus pneumoniae NOT DETECTED NOT DETECTED Final   Streptococcus pyogenes NOT DETECTED NOT DETECTED Final   A.calcoaceticus-baumannii NOT DETECTED NOT DETECTED Final   Bacteroides fragilis NOT DETECTED NOT DETECTED Final   Enterobacterales NOT DETECTED NOT DETECTED Final   Enterobacter cloacae complex NOT DETECTED NOT DETECTED Final   Escherichia coli NOT DETECTED NOT DETECTED Final   Klebsiella aerogenes NOT DETECTED NOT DETECTED Final   Klebsiella oxytoca NOT DETECTED NOT DETECTED Final   Klebsiella pneumoniae NOT DETECTED NOT DETECTED Final   Proteus species NOT DETECTED NOT DETECTED Final   Salmonella species NOT DETECTED NOT DETECTED Final   Serratia marcescens NOT DETECTED NOT DETECTED  Final   Haemophilus influenzae NOT DETECTED NOT DETECTED Final   Neisseria meningitidis NOT DETECTED NOT DETECTED Final   Pseudomonas aeruginosa NOT DETECTED NOT DETECTED Final   Stenotrophomonas maltophilia NOT DETECTED NOT DETECTED Final   Candida albicans NOT DETECTED NOT DETECTED Final   Candida auris NOT DETECTED NOT DETECTED Final   Candida glabrata NOT DETECTED NOT DETECTED Final   Candida krusei NOT DETECTED NOT DETECTED Final   Candida parapsilosis NOT DETECTED NOT DETECTED Final   Candida tropicalis NOT DETECTED NOT DETECTED Final   Cryptococcus neoformans/gattii NOT DETECTED NOT DETECTED Final   Meth resistant mecA/C and MREJ NOT DETECTED NOT DETECTED Final    Comment: Performed at [facility], [address], [place], [place] [address]  Surgical pcr screen     Status: Abnormal   Collection Time: 08/30/23  7:34 AM   Specimen: Nasal Mucosa; Nasal Swab  Result Value Ref Range Status   MRSA, PCR NEGATIVE NEGATIVE Final   Staphylococcus aureus POSITIVE (A) NEGATIVE Final    Comment: (NOTE) The Xpert SA Assay (FDA approved for NASAL specimens in patients 1 years of age and older), is one component of a comprehensive surveillance program. It is not intended to diagnose infection nor to guide or monitor treatment. Performed at [facility], [address], [place], [place] [address]   Aerobic/Anaerobic Culture w Gram Stain (surgical/deep wound)     Status: None (Preliminary result)   Collection Time: 08/30/23  4:01 PM   Specimen: Wound; Abscess  Result Value Ref Range Status   Specimen Description ABSCESS  Final   Special Requests EPIDURAL  Final   Gram Stain   Final    FEW WBC PRESENT, PREDOMINANTLY PMN RARE GRAM POSITIVE COCCI Performed at [facility], [address] [place], [place] [address]    Culture   Final    ABUNDANT STAPHYLOCOCCUS AUREUS NO ANAEROBES ISOLATED; CULTURE IN PROGRESS FOR 5 DAYS    Report Status PENDING  Incomplete   Organism  ID,  Bacteria STAPHYLOCOCCUS AUREUS  Final      Susceptibility   Staphylococcus aureus - MIC*    CIPROFLOXACIN <=0.5 SENSITIVE Sensitive     ERYTHROMYCIN <=0.25 SENSITIVE Sensitive     GENTAMICIN <=0.5 SENSITIVE Sensitive     OXACILLIN 0.5 SENSITIVE Sensitive     TETRACYCLINE <=1 SENSITIVE Sensitive     VANCOMYCIN 1 SENSITIVE Sensitive     TRIMETH/SULFA <=10 SENSITIVE Sensitive     CLINDAMYCIN <=0.25 SENSITIVE Sensitive     RIFAMPIN <=0.5 SENSITIVE Sensitive     Inducible Clindamycin NEGATIVE Sensitive     LINEZOLID 2 SENSITIVE Sensitive     * ABUNDANT STAPHYLOCOCCUS AUREUS  Culture, blood (Routine X 2) w Reflex to ID Panel     Status: None (Preliminary result)   Collection Time: 08/31/23  9:11 AM   Specimen: BLOOD LEFT HAND  Result Value Ref Range Status   Specimen Description BLOOD LEFT HAND  Final   Special Requests   Final    BOTTLES DRAWN AEROBIC AND ANAEROBIC Blood Culture adequate volume   Culture   Final    NO GROWTH 3 DAYS Performed at [facility], [address], [place], [place] [address]    Report Status PENDING  Incomplete  Culture, blood (Routine X 2) w Reflex to ID Panel     Status: None (Preliminary result)   Collection Time: 08/31/23  9:11 AM   Specimen: BLOOD RIGHT HAND  Result Value Ref Range Status   Specimen Description BLOOD RIGHT HAND  Final   Special Requests   Final    BOTTLES DRAWN AEROBIC AND ANAEROBIC Blood Culture adequate volume   Culture   Final    NO GROWTH 3 DAYS Performed at [facility], [address] [place], [place] [address]    Report Status PENDING  Incomplete    Radiology Studies: [place] EKG SITE RITE  Result Date: 09/02/2023 If Site Rite image not attached, placement could not be confirmed due to current cardiac rhythm.     Garfield Coiner T. Kambrea Carrasco [facility]  If 7PM-7AM, please contact night-coverage [email or web address] 09/03/2023, 12:06 PM

## 2023-09-03 NOTE — Progress Notes (Signed)
Providing Compassionate, Quality Care - Together   Subjective: Patient's wife reports bloody drainage from surgical wound. She also notes there is swelling around the surgical site. No purulent drainage. No fever.  Objective: Vital signs in last 24 hours: Temp:  [98.4 F (36.9 C)-99.6 F (37.6 C)] 98.7 F (37.1 C) (10/06 0804) Pulse Rate:  [79-89] 89 (10/06 0804) Resp:  [16-18] 17 (10/06 0804) BP: (122-152)/(76-85) 122/85 (10/06 0519) SpO2:  [95 %-99 %] 98 % (10/06 0804)  Intake/Output from previous day: 10/05 0701 - 10/06 0700 In: 920 [P.O.:720; IV Piggyback:200] Out: -  Intake/Output this shift: No intake/output data recorded.  Alert and oriented x 4 PERRLA CN II-XII grossly intact MAE, Strength and sensation intact Mepilex dressing, Dermabond, small amount of sanguinous drainage. Incision with mild swelling around incision.  Lab Results: Recent Labs    09/01/23 0339 09/02/23 0949  WBC 10.7* 9.7  HGB 11.2* 11.1*  HCT 34.9* 34.7*  PLT 473* 470*   BMET Recent Labs    09/01/23 0339  NA 136  K 3.9  CL 101  CO2 25  GLUCOSE 115*  BUN 16  CREATININE 0.89  CALCIUM 8.2*    Studies/Results: [place] EKG SITE RITE  Result Date: 09/02/2023 If Site Rite image not attached, placement could not be confirmed due to current cardiac rhythm.  ECHOCARDIOGRAM COMPLETE  Result Date: 09/01/2023    ECHOCARDIOGRAM REPORT   Patient Name:   Dan Sanchez Date of Exam: 09/01/2023 Medical Rec #:  [id number]      Height:       70.0 in Accession #:    [id number]     Weight:       175.0 lb Date of Birth:  [birth year]      BSA:          1.972 m Patient Age:    67 years       BP:           138/90 mmHg Patient Gender: M              HR:           80 bpm. Exam Location:  Inpatient Procedure: 2D Echo, 3D Echo, Cardiac Doppler and Color Doppler Indications:    Bacteremia R78.81  History:        Patient has no prior history of Echocardiogram examinations.                 CAD, Signs/Symptoms:Chest  Pain; Risk Factors:Hypertension and                 Dyslipidemia.  Sonographer:    Lucendia Herrlich RCS Referring Phys: [id number] TAYE T GONFA IMPRESSIONS  1. Left ventricular ejection fraction, by estimation, is 60 to 65%. Left ventricular ejection fraction by 3D volume is 67 %. The left ventricle has normal function. The left ventricle has no regional wall motion abnormalities. Left ventricular diastolic  parameters are indeterminate.  2. Right ventricular systolic function is normal. The right ventricular size is normal.  3. The mitral valve is normal in structure. Trivial mitral valve regurgitation. No evidence of mitral stenosis.  4. The aortic valve is normal in structure. Aortic valve regurgitation is not visualized. No aortic stenosis is present.  5. Hemangioma noted in the liver . The inferior vena cava is normal in size with greater than 50% respiratory variability, suggesting right atrial pressure of 3 mmHg. FINDINGS  Left Ventricle: Left ventricular ejection fraction, by estimation, is 60 to 65%. Left  ventricular ejection fraction by 3D volume is 67 %. The left ventricle has normal function. The left ventricle has no regional wall motion abnormalities. The left ventricular internal cavity size was normal in size. There is no left ventricular hypertrophy. Left ventricular diastolic parameters are indeterminate. Right Ventricle: The right ventricular size is normal. No increase in right ventricular wall thickness. Right ventricular systolic function is normal. Left Atrium: Left atrial size was normal in size. Right Atrium: Right atrial size was normal in size. Pericardium: There is no evidence of pericardial effusion. Mitral Valve: The mitral valve is normal in structure. Trivial mitral valve regurgitation. No evidence of mitral valve stenosis. Tricuspid Valve: The tricuspid valve is normal in structure. Tricuspid valve regurgitation is not demonstrated. No evidence of tricuspid stenosis. Aortic Valve: The  aortic valve is normal in structure. Aortic valve regurgitation is not visualized. No aortic stenosis is present. Aortic valve peak gradient measures 8.8 mmHg. Pulmonic Valve: The pulmonic valve was normal in structure. Pulmonic valve regurgitation is not visualized. No evidence of pulmonic stenosis. Aorta: The aortic root is normal in size and structure. Venous: Hemangioma noted in the liver. The inferior vena cava is normal in size with greater than 50% respiratory variability, suggesting right atrial pressure of 3 mmHg. IAS/Shunts: No atrial level shunt detected by color flow Doppler.  LEFT VENTRICLE PLAX 2D LVIDd:         4.70 cm         Diastology LVIDs:         2.35 cm         LV e' medial:    6.85 cm/s LV PW:         1.00 cm         LV E/e' medial:  8.7 LV IVS:        1.10 cm         LV e' lateral:   12.60 cm/s LVOT diam:     2.10 cm         LV E/e' lateral: 4.7 LV SV:         68 LV SV Index:   35 LVOT Area:     3.46 cm        3D Volume EF                                LV 3D EF:    Left                                             ventricul                                             ar                                             ejection  fraction                                             by 3D                                             volume is                                             67 %.                                 3D Volume EF:                                3D EF:        67 %                                LV EDV:       139 ml                                LV ESV:       46 ml                                LV SV:        93 ml RIGHT VENTRICLE             IVC RV S prime:     16.30 cm/s  IVC diam: 1.50 cm TAPSE (M-mode): 3.0 cm LEFT ATRIUM             Index        RIGHT ATRIUM           Index LA diam:        3.40 cm 1.72 cm/m   RA Area:     17.50 cm LA Vol (A2C):   45.3 ml 22.97 ml/m  RA Volume:   37.00 ml  18.76 ml/m LA Vol (A4C):   40.4 ml  20.49 ml/m LA Biplane Vol: 44.9 ml 22.77 ml/m  AORTIC VALVE AV Area (Vmax): 2.48 cm AV Vmax:        148.00 cm/s AV Peak Grad:   8.8 mmHg LVOT Vmax:      106.00 cm/s LVOT Vmean:     67.667 cm/s LVOT VTI:       0.197 m  AORTA Ao Root diam: 3.60 cm Ao Asc diam:  3.50 cm MITRAL VALVE               TRICUSPID VALVE MV Area (PHT): 3.72 cm    TR Peak grad:   17.5 mmHg MV Decel Time: 204 msec    TR Vmax:        209.00 cm/s MR Peak grad: 82.4 mmHg MR Vmax:      454.00 cm/s  SHUNTS MV E velocity: 59.70 cm/s  Systemic VTI:  0.20 m MV A velocity: 68.40 cm/s  Systemic Diam: 2.10 cm MV E/A ratio:  0.87 Kardie Tobb DO Electronically signed by Thomasene Ripple DO Signature Date/Time: 09/01/2023/1:22:12 PM    Final     Assessment/Plan: Patient underwent laminotomy for evacuation of epidural abscess by Dr. Maurice Small on 08/30/2023. Culture grew Staph aureus. Patient on Ancef.    LOS: 5 days   -PICC to be placed today per patient's wife. -TEE Monday per ID, if no endocarditis, can d/c home    Val Eagle, DNP, AGNP-C Nurse Practitioner  [facility] [address], [place], [place] [address] P: [phone]    F: [phone]  09/03/2023, 9:33 AM

## 2023-09-04 ENCOUNTER — Inpatient Hospital Stay (HOSPITAL_COMMUNITY): Payer: Medicare Other | Admitting: Certified Registered Nurse Anesthetist

## 2023-09-04 ENCOUNTER — Other Ambulatory Visit (HOSPITAL_COMMUNITY): Payer: Self-pay

## 2023-09-04 ENCOUNTER — Inpatient Hospital Stay (HOSPITAL_COMMUNITY): Payer: Medicare Other

## 2023-09-04 ENCOUNTER — Ambulatory Visit: Payer: Medicare Other | Admitting: Physical Therapy

## 2023-09-04 ENCOUNTER — Encounter (HOSPITAL_COMMUNITY)
Admission: EM | Disposition: A | Discharge status: Home / Self Care | Payer: Self-pay | Source: Home / Self Care | Attending: Student

## 2023-09-04 DIAGNOSIS — I10 Essential (primary) hypertension: Secondary | ICD-10-CM

## 2023-09-04 DIAGNOSIS — B9561 Methicillin susceptible Staphylococcus aureus infection as the cause of diseases classified elsewhere: Secondary | ICD-10-CM

## 2023-09-04 DIAGNOSIS — G061 Intraspinal abscess and granuloma: Secondary | ICD-10-CM | POA: Diagnosis not present

## 2023-09-04 DIAGNOSIS — R7881 Bacteremia: Secondary | ICD-10-CM

## 2023-09-04 DIAGNOSIS — E43 Unspecified severe protein-calorie malnutrition: Secondary | ICD-10-CM | POA: Diagnosis not present

## 2023-09-04 DIAGNOSIS — R7989 Other specified abnormal findings of blood chemistry: Secondary | ICD-10-CM | POA: Diagnosis not present

## 2023-09-04 DIAGNOSIS — M4656 Other infective spondylopathies, lumbar region: Secondary | ICD-10-CM | POA: Diagnosis not present

## 2023-09-04 HISTORY — PX: TEE WITHOUT CARDIOVERSION: SHX5443

## 2023-09-04 LAB — COMPREHENSIVE METABOLIC PANEL
ALT: 81 U/L — ABNORMAL HIGH (ref 0–44)
AST: 54 U/L — ABNORMAL HIGH (ref 15–41)
Albumin: 2 g/dL — ABNORMAL LOW (ref 3.5–5.0)
Alkaline Phosphatase: 103 U/L (ref 38–126)
Anion gap: 9 (ref 5–15)
BUN: 9 mg/dL (ref 8–23)
CO2: 26 mmol/L (ref 22–32)
Calcium: 8.2 mg/dL — ABNORMAL LOW (ref 8.9–10.3)
Chloride: 101 mmol/L (ref 98–111)
Creatinine, Ser: 0.77 mg/dL (ref 0.61–1.24)
GFR, Estimated: >60 mL/min (ref >60)
Glucose, Bld: 127 mg/dL — ABNORMAL HIGH (ref 70–99)
Potassium: 3.6 mmol/L (ref 3.5–5.1)
Sodium: 136 mmol/L (ref 135–145)
Total Bilirubin: 0.4 mg/dL (ref 0.3–1.2)
Total Protein: 5.9 g/dL — ABNORMAL LOW (ref 6.5–8.1)

## 2023-09-04 LAB — CBC
HCT: 30.2 % — ABNORMAL LOW (ref 39.0–52.0)
Hemoglobin: 9.8 g/dL — ABNORMAL LOW (ref 13.0–17.0)
MCH: 28.6 pg (ref 26.0–34.0)
MCHC: 32.5 g/dL (ref 30.0–36.0)
MCV: 88 fL (ref 80.0–100.0)
Platelets: 407 10*3/uL — ABNORMAL HIGH (ref 150–400)
RBC: 3.43 MIL/uL — ABNORMAL LOW (ref 4.22–5.81)
RDW: 12.6 % (ref 11.5–15.5)
WBC: 10.5 10*3/uL (ref 4.0–10.5)
nRBC: 0 % (ref 0.0–0.2)

## 2023-09-04 LAB — AEROBIC/ANAEROBIC CULTURE W GRAM STAIN (SURGICAL/DEEP WOUND)

## 2023-09-04 LAB — ECHO TEE

## 2023-09-04 LAB — C-REACTIVE PROTEIN: CRP: 12.9 mg/dL — ABNORMAL HIGH (ref <1.0)

## 2023-09-04 LAB — MAGNESIUM: Magnesium: 2.3 mg/dL (ref 1.7–2.4)

## 2023-09-04 LAB — SEDIMENTATION RATE: Sed Rate: 122 mm/h — ABNORMAL HIGH (ref 0–16)

## 2023-09-04 SURGERY — ECHOCARDIOGRAM, TRANSESOPHAGEAL
Anesthesia: Monitor Anesthesia Care

## 2023-09-04 MED ORDER — CEFAZOLIN IV (FOR PTA / DISCHARGE USE ONLY): 2.0000 g | Freq: Three times a day (TID) | INTRAVENOUS | 0 refills | Status: AC

## 2023-09-04 MED ORDER — HEPARIN SOD (PORK) LOCK FLUSH 100 UNIT/ML IV SOLN: 250.0000 [IU] | INTRAVENOUS | Status: DC | PRN

## 2023-09-04 MED ORDER — PROPOFOL 10 MG/ML IV BOLUS
INTRAVENOUS | Status: DC | PRN
  Administered 2023-09-04: 50 mg via INTRAVENOUS

## 2023-09-04 MED ORDER — TRAMADOL HCL 50 MG PO TABS
50.0000 mg | ORAL_TABLET | Freq: Three times a day (TID) | ORAL | 0 refills | Status: AC | PRN
  Filled 2023-09-04 – 2023-09-06 (×2): qty 15, 5d supply, fill #0

## 2023-09-04 MED ORDER — HEPARIN SOD (PORK) LOCK FLUSH 100 UNIT/ML IV SOLN
250.0000 [IU] | INTRAVENOUS | Status: DC | PRN
  Filled 2023-09-04: qty 3

## 2023-09-04 MED ORDER — ROSUVASTATIN CALCIUM 40 MG PO TABS: 40.0000 mg | ORAL_TABLET | Freq: Every day | ORAL | Status: DC

## 2023-09-04 MED ORDER — SODIUM CHLORIDE 0.9 % IV SOLN: INTRAVENOUS | Status: DC

## 2023-09-04 MED ORDER — ACETAMINOPHEN 325 MG PO TABS: 650.0000 mg | ORAL_TABLET | Freq: Three times a day (TID) | ORAL | Status: AC | PRN

## 2023-09-04 MED ORDER — PROPOFOL 500 MG/50ML IV EMUL
INTRAVENOUS | Status: DC | PRN
  Administered 2023-09-04: 100 ug/kg/min via INTRAVENOUS

## 2023-09-04 MED ORDER — MUPIROCIN 2 % EX OINT: TOPICAL_OINTMENT | Freq: Two times a day (BID) | CUTANEOUS | Status: DC

## 2023-09-04 NOTE — TOC Transition Note (Addendum)
Transition of Care [facility]) - CM/SW Discharge Note   Patient Details  Name: Dan Sanchez MRN: [id number] Date of Birth: Jan 13, 1956  Transition of Care [facility]) CM/SW Contact:  Epifanio Lesches, RN Phone Number: 09/04/2023, 11:46 AM   Clinical Narrative:    Patient will DC to: home Anticipated DC date: 09/04/2023 Family notified: yes Transport by: car      - MSSA bacteremia 2/2 epidural abscess  Per MD patient ready for DC today. RN, patient, patient's wife, [facility] and [facility] notified of DC. Wife to assist with care. Home infusion teaching will be done @ bedside by 2 pm with Pam ([facility]) with pt /wife prior to d/c. Pt without RX med concerns. Post hospital f/u noted on AVS. Wife to provide transportation to home.  RNCM will sign off for now as intervention is no longer needed. Please consult [place] again if new needs arise.    Final next level of care: Home w Home Health Services Barriers to Discharge: No Barriers Identified   Patient Goals and CMS Choice   Choice offered to / list presented to : Patient  Discharge Placement                         Discharge Plan and Services Additional resources added to the After Visit Summary for     Discharge Planning Services: CM Consult            DME Arranged:  (IV ABX therapy) DME Agency: Other - Comment ([facility]) Date DME Agency Contacted: 08/31/23 Time DME Agency Contacted: 1223 Representative spoke with at DME Agency: Pam [facility] Arranged: RN HH Agency: [facility] ([facility] Date HH Agency Contacted: 08/31/23 Time HH Agency Contacted: 1221 Representative spoke with at [facility] Agency: Morrie Sheldon  Social Determinants of Health (SDOH) Interventions SDOH Screenings   Depression (PHQ2-9): Low Risk  (08/09/2023)  Tobacco Use: Low Risk  (08/30/2023)     Readmission Risk Interventions     No data to display

## 2023-09-04 NOTE — Progress Notes (Signed)
PHARMACY CONSULT NOTE FOR:  OUTPATIENT  PARENTERAL ANTIBIOTIC THERAPY (OPAT)  Indication: Cefazolin 2g IV every 8 hours Regimen: MSSA epidural abscess End date: 10/11/23 (6 weeks from OR 08/30/23)  IV antibiotic discharge orders are pended. To discharging provider:  please sign these orders via discharge navigator,  Select New Orders & click on the button choice - Manage This Unsigned Work.     Thank you for allowing pharmacy to be a part of this patient's care.  Georgina Pillion, PharmD, BCPS, BCIDP Infectious Diseases Clinical Pharmacist 09/04/2023 11:51 AM   **Pharmacist phone directory can now be found on [email or web address] ([email or web address]).  Listed under [facility].

## 2023-09-04 NOTE — Interval H&P Note (Signed)
History and Physical Interval Note:  09/04/2023 9:36 AM  Dan Sanchez  has presented today for surgery, with the diagnosis of Bacteremia.  The various methods of treatment have been discussed with the patient and family. After consideration of risks, benefits and other options for treatment, the patient has consented to  Procedure(s): TRANSESOPHAGEAL ECHOCARDIOGRAM (N/A) as a surgical intervention.  The patient's history has been reviewed, patient examined, no change in status, stable for surgery.  I have reviewed the patient's chart and labs.  Questions were answered to the patient's satisfaction.     [name]

## 2023-09-04 NOTE — Anesthesia Preprocedure Evaluation (Signed)
Anesthesia Evaluation  Patient identified by MRN, date of birth, ID band Patient awake    Reviewed: Allergy & Precautions, NPO status , Patient's Chart, lab work & pertinent test results  History of Anesthesia Complications Negative for: history of anesthetic complications  Airway Mallampati: IV  TM Distance: >3 FB Neck ROM: Full  Mouth opening: Limited Mouth Opening  Dental  (+) Dental Advisory Given, Teeth Intact   Pulmonary neg pulmonary ROS   breath sounds clear to auscultation       Cardiovascular hypertension, Pt. on medications (-) angina + CAD  (-) Past MI and (-) CHF (-) dysrhythmias  Rhythm:Regular  1. Left ventricular ejection fraction, by estimation, is 60 to 65%. Left  ventricular ejection fraction by 3D volume is 67 %. The left ventricle has  normal function. The left ventricle has no regional wall motion  abnormalities. Left ventricular diastolic   parameters are indeterminate.   2. Right ventricular systolic function is normal. The right ventricular  size is normal.   3. The mitral valve is normal in structure. Trivial mitral valve  regurgitation. No evidence of mitral stenosis.   4. The aortic valve is normal in structure. Aortic valve regurgitation is  not visualized. No aortic stenosis is present.   5. Hemangioma noted in the liver . The inferior vena cava is normal in  size with greater than 50% respiratory variability, suggesting right  atrial pressure of 3 mmHg.     Neuro/Psych neg Seizures  Neuromuscular disease  negative psych ROS   GI/Hepatic Neg liver ROS,GERD  Medicated and Controlled,,  Endo/Other  negative endocrine ROS    Renal/GU negative Renal ROSLab Results      Component                Value               Date                      NA                       136                 09/04/2023                K                        3.6                 09/04/2023                CO2                       26                  09/04/2023                GLUCOSE                  127 (H)             09/04/2023                BUN                      9  09/04/2023                CREATININE               0.77                09/04/2023                CALCIUM                  8.2 (L)             09/04/2023                GFR                      84.27               08/09/2023                GFRNONAA                 >60                 09/04/2023                Musculoskeletal  (+) Arthritis ,    Abdominal   Peds  Hematology  (+) Blood dyscrasia, anemia Lab Results      Component                Value               Date                      WBC                      10.5                09/04/2023                HGB                      9.8 (L)             09/04/2023                HCT                      30.2 (L)            09/04/2023                MCV                      88.0                09/04/2023                PLT                      407 (H)             09/04/2023              Anesthesia Other Findings   Reproductive/Obstetrics                              Anesthesia Physical Anesthesia Plan  ASA: 2  Anesthesia Plan: MAC  Post-op Pain Management: Minimal or no pain anticipated   Induction: Intravenous  PONV Risk Score and Plan: 1 and Propofol infusion and Treatment may vary due to age or medical condition  Airway Management Planned: Nasal Cannula, Simple Face Mask and Natural Airway  Additional Equipment: None  Intra-op Plan:   Post-operative Plan:   Informed Consent: I have reviewed the patients History and Physical, chart, labs and discussed the procedure including the risks, benefits and alternatives for the proposed anesthesia with the patient or authorized representative who has indicated his/her understanding and acceptance.     Dental advisory given  Plan Discussed with: CRNA  Anesthesia Plan Comments:           Anesthesia Quick Evaluation

## 2023-09-04 NOTE — Plan of Care (Signed)
  Problem: Activity: Goal: Risk for activity intolerance will decrease Outcome: Progressing   Problem: Coping: Goal: Level of anxiety will decrease Outcome: Progressing   Problem: Safety: Goal: Ability to remain free from injury will improve Outcome: Progressing   

## 2023-09-04 NOTE — CV Procedure (Signed)
TEE  Indication:  Bacteremia  Pt sedated by anesthesia with propofol intravenously  Mouth guard placed in mouth TEE probe advanced to mid esophagus without difficulty   MV normal  Trace MR TV normal  Trace TR AV normal  Trace AI PV normal  Trace PI  LVEF and RVEF normal  No PFO as tested with injection of agitated saline  LA, LAA without masses  Normal thoracic aorta   Procedure was without complication  [name] MD

## 2023-09-04 NOTE — Transfer of Care (Signed)
Immediate Anesthesia Transfer of Care Note  Patient: Severiano Utsey Feeser  Procedure(s) Performed: TRANSESOPHAGEAL ECHOCARDIOGRAM  Patient Location: Cath Lab  Anesthesia Type:MAC  Level of Consciousness: drowsy and patient cooperative  Airway & Oxygen Therapy: Patient Spontanous Breathing and Patient connected to nasal cannula oxygen  Post-op Assessment: Report given to RN, Post -op Vital signs reviewed and stable, and Patient moving all extremities X 4  Post vital signs: Reviewed and stable  Last Vitals:  Vitals Value Taken Time  BP 110/74   Temp    Pulse 74   Resp 14   SpO2 100     Last Pain:  Vitals:   09/04/23 0924  TempSrc: Temporal  PainSc: 0-No pain      Patients Stated Pain Goal: 3 (09/02/23 2203)  Complications: No notable events documented.

## 2023-09-04 NOTE — Progress Notes (Signed)
Brief ID Note Update:    Dan Sanchez is a 67 y.o. male with mssa bacteremia 2/2 epidural abscess s/p evacuation.   TEE is negative.   PICC is in place and repeat blood cultures negative. Patient doing well.   Please see OPAT below for details. Will FU in ID clinic outpatient.    OPAT ORDERS:  Diagnosis: Epidural abscess   Culture Result: MSSA  Allergies  Allergen Reactions   Bee Venom Swelling     Discharge antibiotics to be given via PICC line:  Cefazolin 2 gm IV Q8h    Duration: 6 weeks   End Date: 10/11/2023  [facility] Care Per Protocol with [employer] Use: Home health RN for IV administration and teaching, line care and labs.    Labs weekly while on IV antibiotics: _x_ CBC with differential __ BMP **TWICE WEEKLY ON VANCOMYCIN  _x_ CMP _x_ CRP _x_ ESR __ Vancomycin trough TWICE WEEKLY __ CK  _x_ Please pull PIC at completion of IV antibiotics __ Please leave PIC in place until doctor has seen patient or been notified  Fax weekly labs to [phone]  Clinic Follow Up Appt: 09/26/2023 @ 1:45 pm with Dr. Fernande Boyden, MSN, NP-C [facility]  [email or web address] Pager: [phone] Office: [phone] [facility] Main Line: [phone] *Secure Chat Communication Welcome

## 2023-09-04 NOTE — Discharge Summary (Signed)
[id number]     Weight:       175.0 lb Date of Birth:  11-12-1956      BSA:          1.972 m Patient Age:    67 years       BP:           138/90 mmHg Patient Gender: M              HR:           80 bpm. Exam Location:  Inpatient Procedure: 2D Echo, 3D Echo, Cardiac Doppler and Color Doppler Indications:    Bacteremia R78.81  History:        Patient has no prior history of Echocardiogram examinations.                 CAD, Signs/Symptoms:Chest Pain; Risk Factors:Hypertension and                 Dyslipidemia.  Sonographer:    Lucendia Herrlich RCS Referring Phys: [id number] Deija Buhrman T Tranice Laduke IMPRESSIONS  1. Left ventricular ejection fraction, by estimation, is 60 to 65%. Left ventricular ejection fraction by 3D volume is 67 %. The left ventricle has normal function. The left ventricle has no regional wall motion abnormalities. Left ventricular diastolic  parameters are indeterminate.  2. Right ventricular systolic function is normal. The right ventricular size is normal.  3. The mitral valve is normal in structure. Trivial mitral valve regurgitation. No evidence of mitral stenosis.  4. The aortic valve is normal in structure. Aortic valve regurgitation is not visualized. No aortic stenosis is present.  5. Hemangioma noted in the liver . The inferior vena cava is normal in size with greater than 50% respiratory variability, suggesting right atrial pressure of 3 mmHg. FINDINGS  Left Ventricle: Left ventricular ejection fraction, by estimation, is 60 to 65%. Left ventricular ejection fraction by 3D volume is 67 %. The left ventricle has normal function. The left ventricle has no regional wall  motion abnormalities. The left ventricular internal cavity size was normal in size. There is no left ventricular hypertrophy. Left ventricular diastolic parameters are indeterminate. Right Ventricle: The right ventricular size is normal. No increase in right ventricular wall thickness. Right ventricular systolic function is normal. Left Atrium: Left atrial size was normal in size. Right Atrium: Right atrial size was normal in size. Pericardium: There is no evidence of pericardial effusion. Mitral Valve: The mitral valve is normal in structure. Trivial mitral valve regurgitation. No evidence of mitral valve stenosis. Tricuspid Valve: The tricuspid valve is normal in structure. Tricuspid valve regurgitation is not demonstrated. No evidence of tricuspid stenosis. Aortic Valve: The aortic valve is normal in structure. Aortic valve regurgitation is not visualized. No aortic stenosis is present. Aortic valve peak gradient measures 8.8 mmHg. Pulmonic Valve: The pulmonic valve was normal in structure. Pulmonic valve regurgitation is not visualized. No evidence of pulmonic stenosis. Aorta: The aortic root is normal in size and structure. Venous: Hemangioma noted in the liver. The inferior vena cava is normal in size with greater than 50% respiratory variability, suggesting right atrial pressure of 3 mmHg. IAS/Shunts: No atrial level shunt detected by color flow Doppler.  LEFT VENTRICLE PLAX 2D LVIDd:         4.70 cm         Diastology LVIDs:         2.35 cm         LV e' medial:    6.85  Physician Discharge Summary  Dan Sanchez WUJ:[id number] DOB: September 22, 1956 DOA: 08/29/2023  PCP: Sheliah Hatch, MD  Admit date: 08/29/2023 Discharge date: 09/04/2023 Admitted From: Home Disposition: Home Recommendations for Outpatient Follow-up:  Follow up with PCP in 1 week Check CBC with differential, BMP, CRP and ESR weekly Check LFT in 1 week.  May resume statin if LFT normalizes Please follow up on the following pending results: None  Home Health: HH RN Equipment/Devices: Home IV antibiotics/infusion  Discharge Condition: Stable CODE STATUS: Full code  Follow-up Information     Sheliah Hatch, MD. Schedule an appointment as soon as possible for a visit in 1 week(s).   Specialty: Family Medicine Contact information: [address] [place] [address] [phone]         Judyann Munson, MD Follow up on 09/26/2023.   Specialty: Infectious Diseases Why: Hospital Discharge Follow Up @ 1:45 pm with Dr. Raynelle Dick information: [address] [place] [address] [phone]                 Hospital course 67 year old M with PMH of HTN and HLD presenting with back pain, leg swelling and weakness, night sweat and unintentional weight loss, and admitted with lumbar septic arthritis, dorsal epidural abscesses right psoas and bilateral paraspinous muscle myositis with multiple small abscesses, and severe spinal canal stenosis at L3-4 and L4-5 as noted on MRI lumbar spine.  Blood cultures obtained.  Started on vancomycin and ceftriaxone.  Neurosurgery consulted.   The next day, patient underwent L3-4, L4-5 open laminectomy and evacuation of epidural abscess. ID consulted.  Blood culture with MSSA.  Repeat blood culture NGTD.  Antibiotic de-escalated to IV Ancef.  TTE and TEE without significant finding.  Discharged on IV Ancef until 10/11/2023 per recommendation by ID.  Needs weekly CBC with differential, BMP, CRP and ESR.   Outpatient follow-up as above.  See individual problem list below for more.   Problems addressed during this hospitalization Principal Problem:   Abscess in epidural space of lumbar spine Active Problems:   Septic arthritis of lumbar spine (HCC)   Hyperlipidemia   HTN (hypertension)   Myositis of multiple muscles   Normocytic anemia   Elevated LFTs   Protein-calorie malnutrition, severe   MSSA bacteremia   L3-S1 dorsal epidural abscess L4-L5 septic arthritis Right psoas and b/l paraspinous muscle myositis with multiple old small abscesses Severe spinal stenosis L3-4, L4-5 MSSA bacteremia -S/p L3-4 and L4-5 open laminectomy and evacuation of epidural abscess by Dr. Maurice Small.  -Repeat blood culture NGTD. -TTE and TEE without significant finding. -PICC line placed on 10/6. -CTX and vancomycin 10/1> Ancef 08/31/2023-10/11/2023 -Weekly labs -Outpatient follow-up with ID and neurosurgery as above   Elevated LFTs: Mild.  Likely due to the above.  He is also on Crestor. -Holding Crestor for 2 weeks -May resume Crestor if LFT resolves.   Hypertension: Normotensive for most part. -Continue home amlodipine.   Normocytic anemia: Stable.  -Recheck CBC in 1 week   Hyperlipidemia: -Holding home Crestor for elevated LFT although mild   Constipation: Resolved. -Bowel regimen as needed   Anxiety: Was anxious about PICC line and hospitalization.  -Emotional support   Severe malnutrition: POA Nutrition Problem: Severe Malnutrition Etiology: chronic illness (epidural abscess, septic arthritis) Signs/Symptoms: percent weight loss, energy intake < or equal to 75% for > or equal to 1 month Percent weight loss: 11.3 % Interventions: Ensure Enlive (each supplement provides 350kcal and 20 grams  Physician Discharge Summary  Dan Sanchez WUJ:[id number] DOB: September 22, 1956 DOA: 08/29/2023  PCP: Sheliah Hatch, MD  Admit date: 08/29/2023 Discharge date: 09/04/2023 Admitted From: Home Disposition: Home Recommendations for Outpatient Follow-up:  Follow up with PCP in 1 week Check CBC with differential, BMP, CRP and ESR weekly Check LFT in 1 week.  May resume statin if LFT normalizes Please follow up on the following pending results: None  Home Health: HH RN Equipment/Devices: Home IV antibiotics/infusion  Discharge Condition: Stable CODE STATUS: Full code  Follow-up Information     Sheliah Hatch, MD. Schedule an appointment as soon as possible for a visit in 1 week(s).   Specialty: Family Medicine Contact information: [address] [place] [address] [phone]         Judyann Munson, MD Follow up on 09/26/2023.   Specialty: Infectious Diseases Why: Hospital Discharge Follow Up @ 1:45 pm with Dr. Raynelle Dick information: [address] [place] [address] [phone]                 Hospital course 67 year old M with PMH of HTN and HLD presenting with back pain, leg swelling and weakness, night sweat and unintentional weight loss, and admitted with lumbar septic arthritis, dorsal epidural abscesses right psoas and bilateral paraspinous muscle myositis with multiple small abscesses, and severe spinal canal stenosis at L3-4 and L4-5 as noted on MRI lumbar spine.  Blood cultures obtained.  Started on vancomycin and ceftriaxone.  Neurosurgery consulted.   The next day, patient underwent L3-4, L4-5 open laminectomy and evacuation of epidural abscess. ID consulted.  Blood culture with MSSA.  Repeat blood culture NGTD.  Antibiotic de-escalated to IV Ancef.  TTE and TEE without significant finding.  Discharged on IV Ancef until 10/11/2023 per recommendation by ID.  Needs weekly CBC with differential, BMP, CRP and ESR.   Outpatient follow-up as above.  See individual problem list below for more.   Problems addressed during this hospitalization Principal Problem:   Abscess in epidural space of lumbar spine Active Problems:   Septic arthritis of lumbar spine (HCC)   Hyperlipidemia   HTN (hypertension)   Myositis of multiple muscles   Normocytic anemia   Elevated LFTs   Protein-calorie malnutrition, severe   MSSA bacteremia   L3-S1 dorsal epidural abscess L4-L5 septic arthritis Right psoas and b/l paraspinous muscle myositis with multiple old small abscesses Severe spinal stenosis L3-4, L4-5 MSSA bacteremia -S/p L3-4 and L4-5 open laminectomy and evacuation of epidural abscess by Dr. Maurice Small.  -Repeat blood culture NGTD. -TTE and TEE without significant finding. -PICC line placed on 10/6. -CTX and vancomycin 10/1> Ancef 08/31/2023-10/11/2023 -Weekly labs -Outpatient follow-up with ID and neurosurgery as above   Elevated LFTs: Mild.  Likely due to the above.  He is also on Crestor. -Holding Crestor for 2 weeks -May resume Crestor if LFT resolves.   Hypertension: Normotensive for most part. -Continue home amlodipine.   Normocytic anemia: Stable.  -Recheck CBC in 1 week   Hyperlipidemia: -Holding home Crestor for elevated LFT although mild   Constipation: Resolved. -Bowel regimen as needed   Anxiety: Was anxious about PICC line and hospitalization.  -Emotional support   Severe malnutrition: POA Nutrition Problem: Severe Malnutrition Etiology: chronic illness (epidural abscess, septic arthritis) Signs/Symptoms: percent weight loss, energy intake < or equal to 75% for > or equal to 1 month Percent weight loss: 11.3 % Interventions: Ensure Enlive (each supplement provides 350kcal and 20 grams  Physician Discharge Summary  Dan Sanchez WUJ:[id number] DOB: September 22, 1956 DOA: 08/29/2023  PCP: Sheliah Hatch, MD  Admit date: 08/29/2023 Discharge date: 09/04/2023 Admitted From: Home Disposition: Home Recommendations for Outpatient Follow-up:  Follow up with PCP in 1 week Check CBC with differential, BMP, CRP and ESR weekly Check LFT in 1 week.  May resume statin if LFT normalizes Please follow up on the following pending results: None  Home Health: HH RN Equipment/Devices: Home IV antibiotics/infusion  Discharge Condition: Stable CODE STATUS: Full code  Follow-up Information     Sheliah Hatch, MD. Schedule an appointment as soon as possible for a visit in 1 week(s).   Specialty: Family Medicine Contact information: [address] [place] [address] [phone]         Judyann Munson, MD Follow up on 09/26/2023.   Specialty: Infectious Diseases Why: Hospital Discharge Follow Up @ 1:45 pm with Dr. Raynelle Dick information: [address] [place] [address] [phone]                 Hospital course 67 year old M with PMH of HTN and HLD presenting with back pain, leg swelling and weakness, night sweat and unintentional weight loss, and admitted with lumbar septic arthritis, dorsal epidural abscesses right psoas and bilateral paraspinous muscle myositis with multiple small abscesses, and severe spinal canal stenosis at L3-4 and L4-5 as noted on MRI lumbar spine.  Blood cultures obtained.  Started on vancomycin and ceftriaxone.  Neurosurgery consulted.   The next day, patient underwent L3-4, L4-5 open laminectomy and evacuation of epidural abscess. ID consulted.  Blood culture with MSSA.  Repeat blood culture NGTD.  Antibiotic de-escalated to IV Ancef.  TTE and TEE without significant finding.  Discharged on IV Ancef until 10/11/2023 per recommendation by ID.  Needs weekly CBC with differential, BMP, CRP and ESR.   Outpatient follow-up as above.  See individual problem list below for more.   Problems addressed during this hospitalization Principal Problem:   Abscess in epidural space of lumbar spine Active Problems:   Septic arthritis of lumbar spine (HCC)   Hyperlipidemia   HTN (hypertension)   Myositis of multiple muscles   Normocytic anemia   Elevated LFTs   Protein-calorie malnutrition, severe   MSSA bacteremia   L3-S1 dorsal epidural abscess L4-L5 septic arthritis Right psoas and b/l paraspinous muscle myositis with multiple old small abscesses Severe spinal stenosis L3-4, L4-5 MSSA bacteremia -S/p L3-4 and L4-5 open laminectomy and evacuation of epidural abscess by Dr. Maurice Small.  -Repeat blood culture NGTD. -TTE and TEE without significant finding. -PICC line placed on 10/6. -CTX and vancomycin 10/1> Ancef 08/31/2023-10/11/2023 -Weekly labs -Outpatient follow-up with ID and neurosurgery as above   Elevated LFTs: Mild.  Likely due to the above.  He is also on Crestor. -Holding Crestor for 2 weeks -May resume Crestor if LFT resolves.   Hypertension: Normotensive for most part. -Continue home amlodipine.   Normocytic anemia: Stable.  -Recheck CBC in 1 week   Hyperlipidemia: -Holding home Crestor for elevated LFT although mild   Constipation: Resolved. -Bowel regimen as needed   Anxiety: Was anxious about PICC line and hospitalization.  -Emotional support   Severe malnutrition: POA Nutrition Problem: Severe Malnutrition Etiology: chronic illness (epidural abscess, septic arthritis) Signs/Symptoms: percent weight loss, energy intake < or equal to 75% for > or equal to 1 month Percent weight loss: 11.3 % Interventions: Ensure Enlive (each supplement provides 350kcal and 20 grams  Physician Discharge Summary  Dan Sanchez WUJ:[id number] DOB: September 22, 1956 DOA: 08/29/2023  PCP: Sheliah Hatch, MD  Admit date: 08/29/2023 Discharge date: 09/04/2023 Admitted From: Home Disposition: Home Recommendations for Outpatient Follow-up:  Follow up with PCP in 1 week Check CBC with differential, BMP, CRP and ESR weekly Check LFT in 1 week.  May resume statin if LFT normalizes Please follow up on the following pending results: None  Home Health: HH RN Equipment/Devices: Home IV antibiotics/infusion  Discharge Condition: Stable CODE STATUS: Full code  Follow-up Information     Sheliah Hatch, MD. Schedule an appointment as soon as possible for a visit in 1 week(s).   Specialty: Family Medicine Contact information: [address] [place] [address] [phone]         Judyann Munson, MD Follow up on 09/26/2023.   Specialty: Infectious Diseases Why: Hospital Discharge Follow Up @ 1:45 pm with Dr. Raynelle Dick information: [address] [place] [address] [phone]                 Hospital course 67 year old M with PMH of HTN and HLD presenting with back pain, leg swelling and weakness, night sweat and unintentional weight loss, and admitted with lumbar septic arthritis, dorsal epidural abscesses right psoas and bilateral paraspinous muscle myositis with multiple small abscesses, and severe spinal canal stenosis at L3-4 and L4-5 as noted on MRI lumbar spine.  Blood cultures obtained.  Started on vancomycin and ceftriaxone.  Neurosurgery consulted.   The next day, patient underwent L3-4, L4-5 open laminectomy and evacuation of epidural abscess. ID consulted.  Blood culture with MSSA.  Repeat blood culture NGTD.  Antibiotic de-escalated to IV Ancef.  TTE and TEE without significant finding.  Discharged on IV Ancef until 10/11/2023 per recommendation by ID.  Needs weekly CBC with differential, BMP, CRP and ESR.   Outpatient follow-up as above.  See individual problem list below for more.   Problems addressed during this hospitalization Principal Problem:   Abscess in epidural space of lumbar spine Active Problems:   Septic arthritis of lumbar spine (HCC)   Hyperlipidemia   HTN (hypertension)   Myositis of multiple muscles   Normocytic anemia   Elevated LFTs   Protein-calorie malnutrition, severe   MSSA bacteremia   L3-S1 dorsal epidural abscess L4-L5 septic arthritis Right psoas and b/l paraspinous muscle myositis with multiple old small abscesses Severe spinal stenosis L3-4, L4-5 MSSA bacteremia -S/p L3-4 and L4-5 open laminectomy and evacuation of epidural abscess by Dr. Maurice Small.  -Repeat blood culture NGTD. -TTE and TEE without significant finding. -PICC line placed on 10/6. -CTX and vancomycin 10/1> Ancef 08/31/2023-10/11/2023 -Weekly labs -Outpatient follow-up with ID and neurosurgery as above   Elevated LFTs: Mild.  Likely due to the above.  He is also on Crestor. -Holding Crestor for 2 weeks -May resume Crestor if LFT resolves.   Hypertension: Normotensive for most part. -Continue home amlodipine.   Normocytic anemia: Stable.  -Recheck CBC in 1 week   Hyperlipidemia: -Holding home Crestor for elevated LFT although mild   Constipation: Resolved. -Bowel regimen as needed   Anxiety: Was anxious about PICC line and hospitalization.  -Emotional support   Severe malnutrition: POA Nutrition Problem: Severe Malnutrition Etiology: chronic illness (epidural abscess, septic arthritis) Signs/Symptoms: percent weight loss, energy intake < or equal to 75% for > or equal to 1 month Percent weight loss: 11.3 % Interventions: Ensure Enlive (each supplement provides 350kcal and 20 grams  [id number]     Weight:       175.0 lb Date of Birth:  11-12-1956      BSA:          1.972 m Patient Age:    67 years       BP:           138/90 mmHg Patient Gender: M              HR:           80 bpm. Exam Location:  Inpatient Procedure: 2D Echo, 3D Echo, Cardiac Doppler and Color Doppler Indications:    Bacteremia R78.81  History:        Patient has no prior history of Echocardiogram examinations.                 CAD, Signs/Symptoms:Chest Pain; Risk Factors:Hypertension and                 Dyslipidemia.  Sonographer:    Lucendia Herrlich RCS Referring Phys: [id number] Deija Buhrman T Tranice Laduke IMPRESSIONS  1. Left ventricular ejection fraction, by estimation, is 60 to 65%. Left ventricular ejection fraction by 3D volume is 67 %. The left ventricle has normal function. The left ventricle has no regional wall motion abnormalities. Left ventricular diastolic  parameters are indeterminate.  2. Right ventricular systolic function is normal. The right ventricular size is normal.  3. The mitral valve is normal in structure. Trivial mitral valve regurgitation. No evidence of mitral stenosis.  4. The aortic valve is normal in structure. Aortic valve regurgitation is not visualized. No aortic stenosis is present.  5. Hemangioma noted in the liver . The inferior vena cava is normal in size with greater than 50% respiratory variability, suggesting right atrial pressure of 3 mmHg. FINDINGS  Left Ventricle: Left ventricular ejection fraction, by estimation, is 60 to 65%. Left ventricular ejection fraction by 3D volume is 67 %. The left ventricle has normal function. The left ventricle has no regional wall  motion abnormalities. The left ventricular internal cavity size was normal in size. There is no left ventricular hypertrophy. Left ventricular diastolic parameters are indeterminate. Right Ventricle: The right ventricular size is normal. No increase in right ventricular wall thickness. Right ventricular systolic function is normal. Left Atrium: Left atrial size was normal in size. Right Atrium: Right atrial size was normal in size. Pericardium: There is no evidence of pericardial effusion. Mitral Valve: The mitral valve is normal in structure. Trivial mitral valve regurgitation. No evidence of mitral valve stenosis. Tricuspid Valve: The tricuspid valve is normal in structure. Tricuspid valve regurgitation is not demonstrated. No evidence of tricuspid stenosis. Aortic Valve: The aortic valve is normal in structure. Aortic valve regurgitation is not visualized. No aortic stenosis is present. Aortic valve peak gradient measures 8.8 mmHg. Pulmonic Valve: The pulmonic valve was normal in structure. Pulmonic valve regurgitation is not visualized. No evidence of pulmonic stenosis. Aorta: The aortic root is normal in size and structure. Venous: Hemangioma noted in the liver. The inferior vena cava is normal in size with greater than 50% respiratory variability, suggesting right atrial pressure of 3 mmHg. IAS/Shunts: No atrial level shunt detected by color flow Doppler.  LEFT VENTRICLE PLAX 2D LVIDd:         4.70 cm         Diastology LVIDs:         2.35 cm         LV e' medial:    6.85  Physician Discharge Summary  Dan Sanchez WUJ:[id number] DOB: September 22, 1956 DOA: 08/29/2023  PCP: Sheliah Hatch, MD  Admit date: 08/29/2023 Discharge date: 09/04/2023 Admitted From: Home Disposition: Home Recommendations for Outpatient Follow-up:  Follow up with PCP in 1 week Check CBC with differential, BMP, CRP and ESR weekly Check LFT in 1 week.  May resume statin if LFT normalizes Please follow up on the following pending results: None  Home Health: HH RN Equipment/Devices: Home IV antibiotics/infusion  Discharge Condition: Stable CODE STATUS: Full code  Follow-up Information     Sheliah Hatch, MD. Schedule an appointment as soon as possible for a visit in 1 week(s).   Specialty: Family Medicine Contact information: [address] [place] [address] [phone]         Judyann Munson, MD Follow up on 09/26/2023.   Specialty: Infectious Diseases Why: Hospital Discharge Follow Up @ 1:45 pm with Dr. Raynelle Dick information: [address] [place] [address] [phone]                 Hospital course 67 year old M with PMH of HTN and HLD presenting with back pain, leg swelling and weakness, night sweat and unintentional weight loss, and admitted with lumbar septic arthritis, dorsal epidural abscesses right psoas and bilateral paraspinous muscle myositis with multiple small abscesses, and severe spinal canal stenosis at L3-4 and L4-5 as noted on MRI lumbar spine.  Blood cultures obtained.  Started on vancomycin and ceftriaxone.  Neurosurgery consulted.   The next day, patient underwent L3-4, L4-5 open laminectomy and evacuation of epidural abscess. ID consulted.  Blood culture with MSSA.  Repeat blood culture NGTD.  Antibiotic de-escalated to IV Ancef.  TTE and TEE without significant finding.  Discharged on IV Ancef until 10/11/2023 per recommendation by ID.  Needs weekly CBC with differential, BMP, CRP and ESR.   Outpatient follow-up as above.  See individual problem list below for more.   Problems addressed during this hospitalization Principal Problem:   Abscess in epidural space of lumbar spine Active Problems:   Septic arthritis of lumbar spine (HCC)   Hyperlipidemia   HTN (hypertension)   Myositis of multiple muscles   Normocytic anemia   Elevated LFTs   Protein-calorie malnutrition, severe   MSSA bacteremia   L3-S1 dorsal epidural abscess L4-L5 septic arthritis Right psoas and b/l paraspinous muscle myositis with multiple old small abscesses Severe spinal stenosis L3-4, L4-5 MSSA bacteremia -S/p L3-4 and L4-5 open laminectomy and evacuation of epidural abscess by Dr. Maurice Small.  -Repeat blood culture NGTD. -TTE and TEE without significant finding. -PICC line placed on 10/6. -CTX and vancomycin 10/1> Ancef 08/31/2023-10/11/2023 -Weekly labs -Outpatient follow-up with ID and neurosurgery as above   Elevated LFTs: Mild.  Likely due to the above.  He is also on Crestor. -Holding Crestor for 2 weeks -May resume Crestor if LFT resolves.   Hypertension: Normotensive for most part. -Continue home amlodipine.   Normocytic anemia: Stable.  -Recheck CBC in 1 week   Hyperlipidemia: -Holding home Crestor for elevated LFT although mild   Constipation: Resolved. -Bowel regimen as needed   Anxiety: Was anxious about PICC line and hospitalization.  -Emotional support   Severe malnutrition: POA Nutrition Problem: Severe Malnutrition Etiology: chronic illness (epidural abscess, septic arthritis) Signs/Symptoms: percent weight loss, energy intake < or equal to 75% for > or equal to 1 month Percent weight loss: 11.3 % Interventions: Ensure Enlive (each supplement provides 350kcal and 20 grams  Physician Discharge Summary  Dan Sanchez WUJ:[id number] DOB: September 22, 1956 DOA: 08/29/2023  PCP: Sheliah Hatch, MD  Admit date: 08/29/2023 Discharge date: 09/04/2023 Admitted From: Home Disposition: Home Recommendations for Outpatient Follow-up:  Follow up with PCP in 1 week Check CBC with differential, BMP, CRP and ESR weekly Check LFT in 1 week.  May resume statin if LFT normalizes Please follow up on the following pending results: None  Home Health: HH RN Equipment/Devices: Home IV antibiotics/infusion  Discharge Condition: Stable CODE STATUS: Full code  Follow-up Information     Sheliah Hatch, MD. Schedule an appointment as soon as possible for a visit in 1 week(s).   Specialty: Family Medicine Contact information: [address] [place] [address] [phone]         Judyann Munson, MD Follow up on 09/26/2023.   Specialty: Infectious Diseases Why: Hospital Discharge Follow Up @ 1:45 pm with Dr. Raynelle Dick information: [address] [place] [address] [phone]                 Hospital course 67 year old M with PMH of HTN and HLD presenting with back pain, leg swelling and weakness, night sweat and unintentional weight loss, and admitted with lumbar septic arthritis, dorsal epidural abscesses right psoas and bilateral paraspinous muscle myositis with multiple small abscesses, and severe spinal canal stenosis at L3-4 and L4-5 as noted on MRI lumbar spine.  Blood cultures obtained.  Started on vancomycin and ceftriaxone.  Neurosurgery consulted.   The next day, patient underwent L3-4, L4-5 open laminectomy and evacuation of epidural abscess. ID consulted.  Blood culture with MSSA.  Repeat blood culture NGTD.  Antibiotic de-escalated to IV Ancef.  TTE and TEE without significant finding.  Discharged on IV Ancef until 10/11/2023 per recommendation by ID.  Needs weekly CBC with differential, BMP, CRP and ESR.   Outpatient follow-up as above.  See individual problem list below for more.   Problems addressed during this hospitalization Principal Problem:   Abscess in epidural space of lumbar spine Active Problems:   Septic arthritis of lumbar spine (HCC)   Hyperlipidemia   HTN (hypertension)   Myositis of multiple muscles   Normocytic anemia   Elevated LFTs   Protein-calorie malnutrition, severe   MSSA bacteremia   L3-S1 dorsal epidural abscess L4-L5 septic arthritis Right psoas and b/l paraspinous muscle myositis with multiple old small abscesses Severe spinal stenosis L3-4, L4-5 MSSA bacteremia -S/p L3-4 and L4-5 open laminectomy and evacuation of epidural abscess by Dr. Maurice Small.  -Repeat blood culture NGTD. -TTE and TEE without significant finding. -PICC line placed on 10/6. -CTX and vancomycin 10/1> Ancef 08/31/2023-10/11/2023 -Weekly labs -Outpatient follow-up with ID and neurosurgery as above   Elevated LFTs: Mild.  Likely due to the above.  He is also on Crestor. -Holding Crestor for 2 weeks -May resume Crestor if LFT resolves.   Hypertension: Normotensive for most part. -Continue home amlodipine.   Normocytic anemia: Stable.  -Recheck CBC in 1 week   Hyperlipidemia: -Holding home Crestor for elevated LFT although mild   Constipation: Resolved. -Bowel regimen as needed   Anxiety: Was anxious about PICC line and hospitalization.  -Emotional support   Severe malnutrition: POA Nutrition Problem: Severe Malnutrition Etiology: chronic illness (epidural abscess, septic arthritis) Signs/Symptoms: percent weight loss, energy intake < or equal to 75% for > or equal to 1 month Percent weight loss: 11.3 % Interventions: Ensure Enlive (each supplement provides 350kcal and 20 grams  Physician Discharge Summary  Dan Sanchez WUJ:[id number] DOB: September 22, 1956 DOA: 08/29/2023  PCP: Sheliah Hatch, MD  Admit date: 08/29/2023 Discharge date: 09/04/2023 Admitted From: Home Disposition: Home Recommendations for Outpatient Follow-up:  Follow up with PCP in 1 week Check CBC with differential, BMP, CRP and ESR weekly Check LFT in 1 week.  May resume statin if LFT normalizes Please follow up on the following pending results: None  Home Health: HH RN Equipment/Devices: Home IV antibiotics/infusion  Discharge Condition: Stable CODE STATUS: Full code  Follow-up Information     Sheliah Hatch, MD. Schedule an appointment as soon as possible for a visit in 1 week(s).   Specialty: Family Medicine Contact information: [address] [place] [address] [phone]         Judyann Munson, MD Follow up on 09/26/2023.   Specialty: Infectious Diseases Why: Hospital Discharge Follow Up @ 1:45 pm with Dr. Raynelle Dick information: [address] [place] [address] [phone]                 Hospital course 67 year old M with PMH of HTN and HLD presenting with back pain, leg swelling and weakness, night sweat and unintentional weight loss, and admitted with lumbar septic arthritis, dorsal epidural abscesses right psoas and bilateral paraspinous muscle myositis with multiple small abscesses, and severe spinal canal stenosis at L3-4 and L4-5 as noted on MRI lumbar spine.  Blood cultures obtained.  Started on vancomycin and ceftriaxone.  Neurosurgery consulted.   The next day, patient underwent L3-4, L4-5 open laminectomy and evacuation of epidural abscess. ID consulted.  Blood culture with MSSA.  Repeat blood culture NGTD.  Antibiotic de-escalated to IV Ancef.  TTE and TEE without significant finding.  Discharged on IV Ancef until 10/11/2023 per recommendation by ID.  Needs weekly CBC with differential, BMP, CRP and ESR.   Outpatient follow-up as above.  See individual problem list below for more.   Problems addressed during this hospitalization Principal Problem:   Abscess in epidural space of lumbar spine Active Problems:   Septic arthritis of lumbar spine (HCC)   Hyperlipidemia   HTN (hypertension)   Myositis of multiple muscles   Normocytic anemia   Elevated LFTs   Protein-calorie malnutrition, severe   MSSA bacteremia   L3-S1 dorsal epidural abscess L4-L5 septic arthritis Right psoas and b/l paraspinous muscle myositis with multiple old small abscesses Severe spinal stenosis L3-4, L4-5 MSSA bacteremia -S/p L3-4 and L4-5 open laminectomy and evacuation of epidural abscess by Dr. Maurice Small.  -Repeat blood culture NGTD. -TTE and TEE without significant finding. -PICC line placed on 10/6. -CTX and vancomycin 10/1> Ancef 08/31/2023-10/11/2023 -Weekly labs -Outpatient follow-up with ID and neurosurgery as above   Elevated LFTs: Mild.  Likely due to the above.  He is also on Crestor. -Holding Crestor for 2 weeks -May resume Crestor if LFT resolves.   Hypertension: Normotensive for most part. -Continue home amlodipine.   Normocytic anemia: Stable.  -Recheck CBC in 1 week   Hyperlipidemia: -Holding home Crestor for elevated LFT although mild   Constipation: Resolved. -Bowel regimen as needed   Anxiety: Was anxious about PICC line and hospitalization.  -Emotional support   Severe malnutrition: POA Nutrition Problem: Severe Malnutrition Etiology: chronic illness (epidural abscess, septic arthritis) Signs/Symptoms: percent weight loss, energy intake < or equal to 75% for > or equal to 1 month Percent weight loss: 11.3 % Interventions: Ensure Enlive (each supplement provides 350kcal and 20 grams  Physician Discharge Summary  Dan Sanchez WUJ:[id number] DOB: September 22, 1956 DOA: 08/29/2023  PCP: Sheliah Hatch, MD  Admit date: 08/29/2023 Discharge date: 09/04/2023 Admitted From: Home Disposition: Home Recommendations for Outpatient Follow-up:  Follow up with PCP in 1 week Check CBC with differential, BMP, CRP and ESR weekly Check LFT in 1 week.  May resume statin if LFT normalizes Please follow up on the following pending results: None  Home Health: HH RN Equipment/Devices: Home IV antibiotics/infusion  Discharge Condition: Stable CODE STATUS: Full code  Follow-up Information     Sheliah Hatch, MD. Schedule an appointment as soon as possible for a visit in 1 week(s).   Specialty: Family Medicine Contact information: [address] [place] [address] [phone]         Judyann Munson, MD Follow up on 09/26/2023.   Specialty: Infectious Diseases Why: Hospital Discharge Follow Up @ 1:45 pm with Dr. Raynelle Dick information: [address] [place] [address] [phone]                 Hospital course 67 year old M with PMH of HTN and HLD presenting with back pain, leg swelling and weakness, night sweat and unintentional weight loss, and admitted with lumbar septic arthritis, dorsal epidural abscesses right psoas and bilateral paraspinous muscle myositis with multiple small abscesses, and severe spinal canal stenosis at L3-4 and L4-5 as noted on MRI lumbar spine.  Blood cultures obtained.  Started on vancomycin and ceftriaxone.  Neurosurgery consulted.   The next day, patient underwent L3-4, L4-5 open laminectomy and evacuation of epidural abscess. ID consulted.  Blood culture with MSSA.  Repeat blood culture NGTD.  Antibiotic de-escalated to IV Ancef.  TTE and TEE without significant finding.  Discharged on IV Ancef until 10/11/2023 per recommendation by ID.  Needs weekly CBC with differential, BMP, CRP and ESR.   Outpatient follow-up as above.  See individual problem list below for more.   Problems addressed during this hospitalization Principal Problem:   Abscess in epidural space of lumbar spine Active Problems:   Septic arthritis of lumbar spine (HCC)   Hyperlipidemia   HTN (hypertension)   Myositis of multiple muscles   Normocytic anemia   Elevated LFTs   Protein-calorie malnutrition, severe   MSSA bacteremia   L3-S1 dorsal epidural abscess L4-L5 septic arthritis Right psoas and b/l paraspinous muscle myositis with multiple old small abscesses Severe spinal stenosis L3-4, L4-5 MSSA bacteremia -S/p L3-4 and L4-5 open laminectomy and evacuation of epidural abscess by Dr. Maurice Small.  -Repeat blood culture NGTD. -TTE and TEE without significant finding. -PICC line placed on 10/6. -CTX and vancomycin 10/1> Ancef 08/31/2023-10/11/2023 -Weekly labs -Outpatient follow-up with ID and neurosurgery as above   Elevated LFTs: Mild.  Likely due to the above.  He is also on Crestor. -Holding Crestor for 2 weeks -May resume Crestor if LFT resolves.   Hypertension: Normotensive for most part. -Continue home amlodipine.   Normocytic anemia: Stable.  -Recheck CBC in 1 week   Hyperlipidemia: -Holding home Crestor for elevated LFT although mild   Constipation: Resolved. -Bowel regimen as needed   Anxiety: Was anxious about PICC line and hospitalization.  -Emotional support   Severe malnutrition: POA Nutrition Problem: Severe Malnutrition Etiology: chronic illness (epidural abscess, septic arthritis) Signs/Symptoms: percent weight loss, energy intake < or equal to 75% for > or equal to 1 month Percent weight loss: 11.3 % Interventions: Ensure Enlive (each supplement provides 350kcal and 20 grams  [id number]     Weight:       175.0 lb Date of Birth:  11-12-1956      BSA:          1.972 m Patient Age:    67 years       BP:           138/90 mmHg Patient Gender: M              HR:           80 bpm. Exam Location:  Inpatient Procedure: 2D Echo, 3D Echo, Cardiac Doppler and Color Doppler Indications:    Bacteremia R78.81  History:        Patient has no prior history of Echocardiogram examinations.                 CAD, Signs/Symptoms:Chest Pain; Risk Factors:Hypertension and                 Dyslipidemia.  Sonographer:    Lucendia Herrlich RCS Referring Phys: [id number] Deija Buhrman T Tranice Laduke IMPRESSIONS  1. Left ventricular ejection fraction, by estimation, is 60 to 65%. Left ventricular ejection fraction by 3D volume is 67 %. The left ventricle has normal function. The left ventricle has no regional wall motion abnormalities. Left ventricular diastolic  parameters are indeterminate.  2. Right ventricular systolic function is normal. The right ventricular size is normal.  3. The mitral valve is normal in structure. Trivial mitral valve regurgitation. No evidence of mitral stenosis.  4. The aortic valve is normal in structure. Aortic valve regurgitation is not visualized. No aortic stenosis is present.  5. Hemangioma noted in the liver . The inferior vena cava is normal in size with greater than 50% respiratory variability, suggesting right atrial pressure of 3 mmHg. FINDINGS  Left Ventricle: Left ventricular ejection fraction, by estimation, is 60 to 65%. Left ventricular ejection fraction by 3D volume is 67 %. The left ventricle has normal function. The left ventricle has no regional wall  motion abnormalities. The left ventricular internal cavity size was normal in size. There is no left ventricular hypertrophy. Left ventricular diastolic parameters are indeterminate. Right Ventricle: The right ventricular size is normal. No increase in right ventricular wall thickness. Right ventricular systolic function is normal. Left Atrium: Left atrial size was normal in size. Right Atrium: Right atrial size was normal in size. Pericardium: There is no evidence of pericardial effusion. Mitral Valve: The mitral valve is normal in structure. Trivial mitral valve regurgitation. No evidence of mitral valve stenosis. Tricuspid Valve: The tricuspid valve is normal in structure. Tricuspid valve regurgitation is not demonstrated. No evidence of tricuspid stenosis. Aortic Valve: The aortic valve is normal in structure. Aortic valve regurgitation is not visualized. No aortic stenosis is present. Aortic valve peak gradient measures 8.8 mmHg. Pulmonic Valve: The pulmonic valve was normal in structure. Pulmonic valve regurgitation is not visualized. No evidence of pulmonic stenosis. Aorta: The aortic root is normal in size and structure. Venous: Hemangioma noted in the liver. The inferior vena cava is normal in size with greater than 50% respiratory variability, suggesting right atrial pressure of 3 mmHg. IAS/Shunts: No atrial level shunt detected by color flow Doppler.  LEFT VENTRICLE PLAX 2D LVIDd:         4.70 cm         Diastology LVIDs:         2.35 cm         LV e' medial:    6.85  Physician Discharge Summary  Dan Sanchez WUJ:[id number] DOB: September 22, 1956 DOA: 08/29/2023  PCP: Sheliah Hatch, MD  Admit date: 08/29/2023 Discharge date: 09/04/2023 Admitted From: Home Disposition: Home Recommendations for Outpatient Follow-up:  Follow up with PCP in 1 week Check CBC with differential, BMP, CRP and ESR weekly Check LFT in 1 week.  May resume statin if LFT normalizes Please follow up on the following pending results: None  Home Health: HH RN Equipment/Devices: Home IV antibiotics/infusion  Discharge Condition: Stable CODE STATUS: Full code  Follow-up Information     Sheliah Hatch, MD. Schedule an appointment as soon as possible for a visit in 1 week(s).   Specialty: Family Medicine Contact information: [address] [place] [address] [phone]         Judyann Munson, MD Follow up on 09/26/2023.   Specialty: Infectious Diseases Why: Hospital Discharge Follow Up @ 1:45 pm with Dr. Raynelle Dick information: [address] [place] [address] [phone]                 Hospital course 67 year old M with PMH of HTN and HLD presenting with back pain, leg swelling and weakness, night sweat and unintentional weight loss, and admitted with lumbar septic arthritis, dorsal epidural abscesses right psoas and bilateral paraspinous muscle myositis with multiple small abscesses, and severe spinal canal stenosis at L3-4 and L4-5 as noted on MRI lumbar spine.  Blood cultures obtained.  Started on vancomycin and ceftriaxone.  Neurosurgery consulted.   The next day, patient underwent L3-4, L4-5 open laminectomy and evacuation of epidural abscess. ID consulted.  Blood culture with MSSA.  Repeat blood culture NGTD.  Antibiotic de-escalated to IV Ancef.  TTE and TEE without significant finding.  Discharged on IV Ancef until 10/11/2023 per recommendation by ID.  Needs weekly CBC with differential, BMP, CRP and ESR.   Outpatient follow-up as above.  See individual problem list below for more.   Problems addressed during this hospitalization Principal Problem:   Abscess in epidural space of lumbar spine Active Problems:   Septic arthritis of lumbar spine (HCC)   Hyperlipidemia   HTN (hypertension)   Myositis of multiple muscles   Normocytic anemia   Elevated LFTs   Protein-calorie malnutrition, severe   MSSA bacteremia   L3-S1 dorsal epidural abscess L4-L5 septic arthritis Right psoas and b/l paraspinous muscle myositis with multiple old small abscesses Severe spinal stenosis L3-4, L4-5 MSSA bacteremia -S/p L3-4 and L4-5 open laminectomy and evacuation of epidural abscess by Dr. Maurice Small.  -Repeat blood culture NGTD. -TTE and TEE without significant finding. -PICC line placed on 10/6. -CTX and vancomycin 10/1> Ancef 08/31/2023-10/11/2023 -Weekly labs -Outpatient follow-up with ID and neurosurgery as above   Elevated LFTs: Mild.  Likely due to the above.  He is also on Crestor. -Holding Crestor for 2 weeks -May resume Crestor if LFT resolves.   Hypertension: Normotensive for most part. -Continue home amlodipine.   Normocytic anemia: Stable.  -Recheck CBC in 1 week   Hyperlipidemia: -Holding home Crestor for elevated LFT although mild   Constipation: Resolved. -Bowel regimen as needed   Anxiety: Was anxious about PICC line and hospitalization.  -Emotional support   Severe malnutrition: POA Nutrition Problem: Severe Malnutrition Etiology: chronic illness (epidural abscess, septic arthritis) Signs/Symptoms: percent weight loss, energy intake < or equal to 75% for > or equal to 1 month Percent weight loss: 11.3 % Interventions: Ensure Enlive (each supplement provides 350kcal and 20 grams

## 2023-09-05 ENCOUNTER — Encounter: Payer: Self-pay | Admitting: Family Medicine

## 2023-09-05 ENCOUNTER — Encounter: Payer: Self-pay | Admitting: Internal Medicine

## 2023-09-05 ENCOUNTER — Telehealth: Payer: Self-pay

## 2023-09-05 ENCOUNTER — Encounter (HOSPITAL_COMMUNITY): Payer: Self-pay | Admitting: Internal Medicine

## 2023-09-05 DIAGNOSIS — I10 Essential (primary) hypertension: Secondary | ICD-10-CM | POA: Diagnosis not present

## 2023-09-05 DIAGNOSIS — M48061 Spinal stenosis, lumbar region without neurogenic claudication: Secondary | ICD-10-CM | POA: Diagnosis not present

## 2023-09-05 DIAGNOSIS — M0008 Staphylococcal arthritis, vertebrae: Secondary | ICD-10-CM | POA: Diagnosis not present

## 2023-09-05 DIAGNOSIS — Z4789 Encounter for other orthopedic aftercare: Secondary | ICD-10-CM | POA: Diagnosis not present

## 2023-09-05 DIAGNOSIS — D649 Anemia, unspecified: Secondary | ICD-10-CM | POA: Diagnosis not present

## 2023-09-05 DIAGNOSIS — M47814 Spondylosis without myelopathy or radiculopathy, thoracic region: Secondary | ICD-10-CM | POA: Diagnosis not present

## 2023-09-05 DIAGNOSIS — F419 Anxiety disorder, unspecified: Secondary | ICD-10-CM | POA: Diagnosis not present

## 2023-09-05 DIAGNOSIS — M6089 Other myositis, multiple sites: Secondary | ICD-10-CM | POA: Diagnosis not present

## 2023-09-05 DIAGNOSIS — E785 Hyperlipidemia, unspecified: Secondary | ICD-10-CM | POA: Diagnosis not present

## 2023-09-05 DIAGNOSIS — Z556 Problems related to health literacy: Secondary | ICD-10-CM | POA: Diagnosis not present

## 2023-09-05 DIAGNOSIS — Z792 Long term (current) use of antibiotics: Secondary | ICD-10-CM | POA: Diagnosis not present

## 2023-09-05 DIAGNOSIS — G061 Intraspinal abscess and granuloma: Secondary | ICD-10-CM | POA: Diagnosis not present

## 2023-09-05 DIAGNOSIS — B9561 Methicillin susceptible Staphylococcus aureus infection as the cause of diseases classified elsewhere: Secondary | ICD-10-CM | POA: Diagnosis not present

## 2023-09-05 DIAGNOSIS — Z7982 Long term (current) use of aspirin: Secondary | ICD-10-CM | POA: Diagnosis not present

## 2023-09-05 DIAGNOSIS — I2510 Atherosclerotic heart disease of native coronary artery without angina pectoris: Secondary | ICD-10-CM | POA: Diagnosis not present

## 2023-09-05 DIAGNOSIS — R634 Abnormal weight loss: Secondary | ICD-10-CM | POA: Diagnosis not present

## 2023-09-05 DIAGNOSIS — E43 Unspecified severe protein-calorie malnutrition: Secondary | ICD-10-CM | POA: Diagnosis not present

## 2023-09-05 DIAGNOSIS — M4726 Other spondylosis with radiculopathy, lumbar region: Secondary | ICD-10-CM | POA: Diagnosis not present

## 2023-09-05 DIAGNOSIS — Z452 Encounter for adjustment and management of vascular access device: Secondary | ICD-10-CM | POA: Diagnosis not present

## 2023-09-05 LAB — CULTURE, BLOOD (ROUTINE X 2)
Culture: NO GROWTH
Culture: NO GROWTH
Special Requests: ADEQUATE
Special Requests: ADEQUATE

## 2023-09-05 NOTE — Transitions of Care (Post Inpatient/ED Visit) (Signed)
09/05/2023  Name: Dan Sanchez MRN: [id number] DOB: 09-25-1956  Today's TOC FU Call Status: Today's TOC FU Call Status:: Successful TOC FU Call Completed TOC FU Call Complete Date: 09/05/23 Patient's Name and Date of Birth confirmed.  Transition Care Management Follow-up Telephone Call Date of Discharge: 09/04/23 Discharge Facility: [facility]) Type of Discharge: Inpatient Admission Primary Inpatient Discharge Diagnosis:: weakness How have you been since you were released from the hospital?: Better Any questions or concerns?: No  Items Reviewed: Did you receive and understand the discharge instructions provided?: Yes Medications obtained,verified, and reconciled?: Yes (Medications Reviewed) Any new allergies since your discharge?: No Dietary orders reviewed?: Yes Do you have support at home?: Yes People in Home: spouse  Medications Reviewed Today: Medications Reviewed Today     Reviewed by Karena Addison, LPN (Licensed Practical Nurse) on 09/05/23 at 1147  Med List Status: <None>   Medication Order Taking? Sig Documenting Provider Last Dose Status Informant  acetaminophen (TYLENOL) 325 MG tablet [id number]  Take 2 tablets (650 mg total) by mouth every 8 (eight) hours as needed for mild pain. Almon Hercules, MD  Active   amLODipine (NORVASC) 10 MG tablet [id number] No Take 1 tablet (10 mg total) by mouth daily. Kathleene Hazel, MD 08/27/2023 am Active Self  ascorbic acid (VITAMIN C) 1000 MG tablet [id number] No Take by mouth. [provider] 08/27/2023 am Active Self  aspirin EC 81 MG tablet [id number] No Take 1 tablet (81 mg total) by mouth daily. Swallow whole. Kathleene Hazel, MD 08/27/2023 am Active Self  ceFAZolin (ANCEF) IVPB [id number]  Inject 2 g into the vein every 8 (eight) hours. Indication:  MSSA epidural abscess First Dose: Yes Last Day of Therapy:  10/11/23 Labs - Once weekly:  CBC/D and BMP, Labs - Once weekly: ESR and CRP Method of  administration: IV Push Method of administration may be changed at the discretion of home infusion pharmacist based upon assessment of the patient and/or caregiver's ability to self-administer the medication ordered. Almon Hercules, MD  Active   celecoxib (CELEBREX) 200 MG capsule [id number] No Take 1 capsule (200 mg total) by mouth 2 (two) times daily.  08/27/2023 pm Active Self  fexofenadine (ALLEGRA) 180 MG tablet [id number] No Take 180 mg by mouth daily. [provider] 08/27/2023 am Active Self  fluticasone (FLONASE) 50 MCG/ACT nasal spray [id number] No Place 2 sprays into both nostrils daily. Waldon Merl, PA-C Past Week Active Self  Omega-3 Fatty Acids (FISH OIL) 1200 MG CAPS [id number] No Take 2,400 mg by mouth 2 (two) times daily. [provider] 08/27/2023 pm Active Self  omeprazole (PRILOSEC) 20 MG capsule [id number] No Take 1 capsule (20 mg total) by mouth daily. Sheliah Hatch, MD 08/27/2023 am Active Self  rosuvastatin (CRESTOR) 40 MG tablet [id number]  Take 1 tablet (40 mg total) by mouth daily. Almon Hercules, MD  Active   traMADol (ULTRAM) 50 MG tablet [id number]  Take 1 tablet (50 mg total) by mouth every 8 (eight) hours as needed for up to 5 days (mild pain). Almon Hercules, MD  Active             Home Care and Equipment/Supplies: Were Home Health Services Ordered?: NA Any new equipment or medical supplies ordered?: NA  Functional Questionnaire: Do you need assistance with bathing/showering or dressing?: No Do you need assistance with meal preparation?: No Do you need assistance with eating?: No Do you have difficulty maintaining continence: No Do  you need assistance with getting out of bed/getting out of a chair/moving?: No Do you have difficulty managing or taking your medications?: No  Follow up appointments reviewed: PCP Follow-up appointment confirmed?: No (declined appt.) MD Provider Line [phone] Given: No Specialist Hospital Follow-up  appointment confirmed?: NA Do you need transportation to your follow-up appointment?: No Do you understand care options if your condition(s) worsen?: Yes-patient verbalized understanding    SIGNATURE Karena Addison, LPN [facility] Nurse Health Advisor Direct Dial [phone]

## 2023-09-06 ENCOUNTER — Ambulatory Visit: Payer: Medicare Other | Admitting: Urology

## 2023-09-06 ENCOUNTER — Other Ambulatory Visit (HOSPITAL_BASED_OUTPATIENT_CLINIC_OR_DEPARTMENT_OTHER): Payer: Self-pay

## 2023-09-06 ENCOUNTER — Other Ambulatory Visit (HOSPITAL_COMMUNITY): Payer: Self-pay

## 2023-09-06 LAB — SUSCEPTIBILITY, AER + ANAEROB

## 2023-09-06 LAB — SUSCEPTIBILITY RESULT

## 2023-09-06 NOTE — Anesthesia Postprocedure Evaluation (Signed)
Anesthesia Post Note  Patient: Dan Sanchez  Procedure(s) Performed: TRANSESOPHAGEAL ECHOCARDIOGRAM     Patient location during evaluation: Cath Lab Anesthesia Type: MAC Level of consciousness: awake and alert Pain management: pain level controlled Vital Signs Assessment: post-procedure vital signs reviewed and stable Respiratory status: spontaneous breathing, nonlabored ventilation and respiratory function stable Cardiovascular status: stable and blood pressure returned to baseline Postop Assessment: no apparent nausea or vomiting Anesthetic complications: no   No notable events documented.  Last Vitals:  Vitals:   09/04/23 1020 09/04/23 1032  BP:  114/68  Pulse: 70 (!) 42  Resp: 14   Temp: 36.6 C 36.5 C  SpO2: 96% 98%    Last Pain:  Vitals:   09/04/23 1032  TempSrc: Oral  PainSc: 9                  Dan Sanchez

## 2023-09-07 ENCOUNTER — Telehealth: Payer: Self-pay

## 2023-09-07 ENCOUNTER — Ambulatory Visit: Payer: Medicare Other | Admitting: Physical Therapy

## 2023-09-07 DIAGNOSIS — M48061 Spinal stenosis, lumbar region without neurogenic claudication: Secondary | ICD-10-CM | POA: Diagnosis not present

## 2023-09-07 DIAGNOSIS — Z4789 Encounter for other orthopedic aftercare: Secondary | ICD-10-CM | POA: Diagnosis not present

## 2023-09-07 DIAGNOSIS — M6089 Other myositis, multiple sites: Secondary | ICD-10-CM | POA: Diagnosis not present

## 2023-09-07 DIAGNOSIS — M4726 Other spondylosis with radiculopathy, lumbar region: Secondary | ICD-10-CM | POA: Diagnosis not present

## 2023-09-07 DIAGNOSIS — R634 Abnormal weight loss: Secondary | ICD-10-CM | POA: Diagnosis not present

## 2023-09-07 DIAGNOSIS — D649 Anemia, unspecified: Secondary | ICD-10-CM | POA: Diagnosis not present

## 2023-09-07 DIAGNOSIS — Z452 Encounter for adjustment and management of vascular access device: Secondary | ICD-10-CM | POA: Diagnosis not present

## 2023-09-07 DIAGNOSIS — M0008 Staphylococcal arthritis, vertebrae: Secondary | ICD-10-CM | POA: Diagnosis not present

## 2023-09-07 DIAGNOSIS — I2510 Atherosclerotic heart disease of native coronary artery without angina pectoris: Secondary | ICD-10-CM | POA: Diagnosis not present

## 2023-09-07 DIAGNOSIS — E785 Hyperlipidemia, unspecified: Secondary | ICD-10-CM | POA: Diagnosis not present

## 2023-09-07 DIAGNOSIS — I10 Essential (primary) hypertension: Secondary | ICD-10-CM | POA: Diagnosis not present

## 2023-09-07 DIAGNOSIS — M47814 Spondylosis without myelopathy or radiculopathy, thoracic region: Secondary | ICD-10-CM | POA: Diagnosis not present

## 2023-09-07 DIAGNOSIS — B9561 Methicillin susceptible Staphylococcus aureus infection as the cause of diseases classified elsewhere: Secondary | ICD-10-CM | POA: Diagnosis not present

## 2023-09-07 DIAGNOSIS — E43 Unspecified severe protein-calorie malnutrition: Secondary | ICD-10-CM | POA: Diagnosis not present

## 2023-09-07 DIAGNOSIS — G061 Intraspinal abscess and granuloma: Secondary | ICD-10-CM | POA: Diagnosis not present

## 2023-09-07 DIAGNOSIS — F419 Anxiety disorder, unspecified: Secondary | ICD-10-CM | POA: Diagnosis not present

## 2023-09-07 NOTE — Transitions of Care (Post Inpatient/ED Visit) (Signed)
09/07/2023  Name: Dan Sanchez MRN: [id number] DOB: 1956-08-18  Today's TOC FU Call Status: Today's TOC FU Call Status:: Unsuccessful Call (1st Attempt) Unsuccessful Call (1st Attempt) Date: 09/07/23  Attempted to reach the patient regarding the most recent Inpatient/ED visit. Spoke briefly to patient He was in middle of [facility] [place] for labs and IV ABT requested a call back at another time   Follow Up Plan: Additional outreach attempts will be made to reach the patient to complete the Transitions of Care (Post Inpatient/ED visit) call.   Susa Loffler , BSN, RN Care Management Coordinator [place]   [facility] [email or web address] Direct Dial: [phone]

## 2023-09-08 ENCOUNTER — Telehealth: Payer: Self-pay

## 2023-09-08 ENCOUNTER — Telehealth: Payer: Self-pay | Admitting: Family Medicine

## 2023-09-08 NOTE — Telephone Encounter (Signed)
Called and spoke with Lorena and VO provided.

## 2023-09-08 NOTE — Telephone Encounter (Signed)
Okay to provide verbal orders?

## 2023-09-08 NOTE — Telephone Encounter (Signed)
Ok for verbal orders ?

## 2023-09-08 NOTE — Transitions of Care (Post Inpatient/ED Visit) (Signed)
09/08/2023  Name: Dan Sanchez MRN: [id number] DOB: [birth year]  Today's TOC FU Call Status: TOC FU Call Complete Date: 09/08/23 Patient's Name and Date of Birth confirmed.  Transition Care Management Follow-up Telephone Call Date of Discharge: 09/04/23 Discharge Facility: [facility]) Type of Discharge: Inpatient Admission Primary Inpatient Discharge Diagnosis:: Epidural abcess + MSSA Bacteremia ( w/ weakness) How have you been since you were released from the hospital?: Better Any questions or concerns?: No (PICC line replaced 09/07/23)  Items Reviewed: Medications obtained,verified, and reconciled?: Yes (Medications Reviewed) Any new allergies since your discharge?: No Dietary orders reviewed?: Yes Type of Diet Ordered:: Reg Heart Healthy Do you have support at home?: Yes People in Home: spouse Name of Support/Comfort Primary Source: Angie  Medications Reviewed Today: Medications Reviewed Today     Reviewed by Johnnette Barrios, RN (Registered Nurse) on 09/08/23 at [phone]  Med List Status: <None>   Medication Order Taking? Sig Documenting Provider Last Dose Status Informant  acetaminophen (TYLENOL) 325 MG tablet [id number] Yes Take 2 tablets (650 mg total) by mouth every 8 (eight) hours as needed for mild pain. Almon Hercules, MD Taking Active   amLODipine (NORVASC) 10 MG tablet [id number] Yes Take 1 tablet (10 mg total) by mouth daily. Kathleene Hazel, MD Taking Active Self  ascorbic acid (VITAMIN C) 1000 MG tablet [id number] Yes Take by mouth. [provider] Taking Active Self  aspirin EC 81 MG tablet [id number] Yes Take 1 tablet (81 mg total) by mouth daily. Swallow whole. Kathleene Hazel, MD Taking Active Self  ceFAZolin (ANCEF) IVPB [id number] Yes Inject 2 g into the vein every 8 (eight) hours. Indication:  MSSA epidural abscess First Dose: Yes Last Day of Therapy:  10/11/23 Labs - Once weekly:  CBC/D and BMP, Labs - Once weekly: ESR and  CRP Method of administration: IV Push Method of administration may be changed at the discretion of home infusion pharmacist based upon assessment of the patient and/or caregiver's ability to self-administer the medication ordered. Almon Hercules, MD Taking Active   celecoxib (CELEBREX) 200 MG capsule [id number] No Take 1 capsule (200 mg total) by mouth 2 (two) times daily.  Patient not taking: Reported on 09/08/2023    Not Taking Active Self  fexofenadine (ALLEGRA) 180 MG tablet [id number] Yes Take 180 mg by mouth daily. [provider] Taking Active Self  fluticasone (FLONASE) 50 MCG/ACT nasal spray [id number] Yes Place 2 sprays into both nostrils daily. Waldon Merl, PA-C Taking Active Self           Med Note Sharon Seller, Alayha Babineaux L   Fri Sep 08, 2023  9:46 AM) Leanora Ivanoff as needed   Omega-3 Fatty Acids (FISH OIL) 1200 MG CAPS [id number] Yes Take 2,400 mg by mouth 2 (two) times daily. [provider] Taking Active Self  omeprazole (PRILOSEC) 20 MG capsule [id number] Yes Take 1 capsule (20 mg total) by mouth daily. Sheliah Hatch, MD Taking Active Self  rosuvastatin (CRESTOR) 40 MG tablet [id number] No Take 1 tablet (40 mg total) by mouth daily.  Patient not taking: Reported on 09/08/2023   Almon Hercules, MD Not Taking Active            Med Note Sharon Seller, Josalynn Johndrow L   Fri Sep 08, 2023  9:47 AM) Elijio Miles 09/18/23  traMADol (ULTRAM) 50 MG tablet [id number] Yes Take 1 tablet (50 mg total) by mouth every 8 (eight) hours as needed for up to 5 days (mild pain). Gonfa,  Boyce Medici, MD Taking Active             Home Care and Equipment/Supplies: Were Home Health Services Ordered?: Yes [facility] Has Agency set up a time to come to your home?: Yes First Home Health Visit Date: 09/05/23 Any new equipment or medical supplies ordered?: Yes Name of Medical supply agency?: [facility] Were you able to get the equipment/medical supplies?: Yes (PICC line supplies and  dressings) Do you have any questions related to the use of the equipment/supplies?: No  Functional Questionnaire: Do you need assistance with bathing/showering or dressing?: No Do you need assistance with meal preparation?: No Do you need assistance with eating?: No Do you have difficulty maintaining continence: No Do you need assistance with getting out of bed/getting out of a chair/moving?: No Do you have difficulty managing or taking your medications?: No  Follow up appointments reviewed: PCP Follow-up appointment confirmed?: No (Per patienr pCP is aware of recent hospital stay, does not need to see him until 10/2023) MD Provider Line Number:[phone] Given: No Specialist Hospital Follow-up appointment confirmed?: Yes Date of Specialist follow-up appointment?: 09/26/23 Follow-Up Specialty Provider:: ID Dr Drue Second 2 appts noted 1:45 pm and 4:00pm Do you need transportation to your follow-up appointment?: No Do you understand care options if your condition(s) worsen?: Yes-patient verbalized understanding  SDOH Interventions Today    Flowsheet Row Most Recent Value  SDOH Interventions   Food Insecurity Interventions Patient Declined, Intervention Not Indicated  Housing Interventions Intervention Not Indicated       Goals Addressed             This Visit's Progress    Patient Stated       [facility] Care Plan       Current Barriers:  New PICC line and requiring IV ABT x 30 days  RNCM Clinical Goal(s):  Patient will work with the Care Management team over the next 30 days to address Transition of Care Barriers: Medication Management Provider appointments Home Health services take all medications exactly as prescribed and will call provider for medication related questions as evidenced by no adverse or uncontrolled side effects , no missed medications  attend all scheduled medical appointments: with Infectious Disease  as evidenced by no missed appts  through collaboration with  RN Care manager, provider, and care team.   Interventions: Evaluation of current treatment plan related to  self management and patient's adherence to plan as established by provider  Transitions of Care:  New goal. Notify Home Health , or VBCI CM for any changes in PICC line , s/s of infection.SOB or pain   Patient Goals/Self-Care Activities: Participate in Transition of Care Program/Attend TOC scheduled calls Take all medications as prescribed Attend all scheduled provider appointments Perform IADL's (shopping, preparing meals, housekeeping, managing finances) independently Call provider office for new concerns or questions   Follow Up Plan:  Telephone follow up appointment with care management team member scheduled for:  09/11/23   2 pm as requested by patient            Susa Loffler , BSN, RN Care Management Coordinator [facility]   [facility] [email or web address] Direct Dial: [phone]

## 2023-09-08 NOTE — Telephone Encounter (Signed)
[facility] is requesting verbal orders for:  Nurse visits once a week and 2 nurse visits PRN Picc line management, antibiotics and blood draws

## 2023-09-09 LAB — CULTURE, BLOOD (ROUTINE X 2): Special Requests: ADEQUATE

## 2023-09-11 ENCOUNTER — Other Ambulatory Visit: Payer: Medicare Other

## 2023-09-11 ENCOUNTER — Telehealth: Payer: Self-pay

## 2023-09-11 ENCOUNTER — Ambulatory Visit: Payer: Medicare Other | Admitting: Physical Therapy

## 2023-09-11 ENCOUNTER — Encounter: Payer: Self-pay | Admitting: Internal Medicine

## 2023-09-11 ENCOUNTER — Encounter: Payer: Self-pay | Admitting: Family Medicine

## 2023-09-11 DIAGNOSIS — M6089 Other myositis, multiple sites: Secondary | ICD-10-CM | POA: Diagnosis not present

## 2023-09-11 DIAGNOSIS — Z452 Encounter for adjustment and management of vascular access device: Secondary | ICD-10-CM | POA: Diagnosis not present

## 2023-09-11 DIAGNOSIS — R972 Elevated prostate specific antigen [PSA]: Secondary | ICD-10-CM

## 2023-09-11 DIAGNOSIS — I10 Essential (primary) hypertension: Secondary | ICD-10-CM | POA: Diagnosis not present

## 2023-09-11 DIAGNOSIS — Z792 Long term (current) use of antibiotics: Secondary | ICD-10-CM | POA: Diagnosis not present

## 2023-09-11 DIAGNOSIS — E43 Unspecified severe protein-calorie malnutrition: Secondary | ICD-10-CM | POA: Diagnosis not present

## 2023-09-11 DIAGNOSIS — M47814 Spondylosis without myelopathy or radiculopathy, thoracic region: Secondary | ICD-10-CM | POA: Diagnosis not present

## 2023-09-11 DIAGNOSIS — R634 Abnormal weight loss: Secondary | ICD-10-CM | POA: Diagnosis not present

## 2023-09-11 DIAGNOSIS — G061 Intraspinal abscess and granuloma: Secondary | ICD-10-CM | POA: Diagnosis not present

## 2023-09-11 DIAGNOSIS — M4726 Other spondylosis with radiculopathy, lumbar region: Secondary | ICD-10-CM | POA: Diagnosis not present

## 2023-09-11 DIAGNOSIS — M48061 Spinal stenosis, lumbar region without neurogenic claudication: Secondary | ICD-10-CM | POA: Diagnosis not present

## 2023-09-11 DIAGNOSIS — M0008 Staphylococcal arthritis, vertebrae: Secondary | ICD-10-CM | POA: Diagnosis not present

## 2023-09-11 DIAGNOSIS — E785 Hyperlipidemia, unspecified: Secondary | ICD-10-CM | POA: Diagnosis not present

## 2023-09-11 DIAGNOSIS — I2510 Atherosclerotic heart disease of native coronary artery without angina pectoris: Secondary | ICD-10-CM | POA: Diagnosis not present

## 2023-09-11 DIAGNOSIS — D649 Anemia, unspecified: Secondary | ICD-10-CM | POA: Diagnosis not present

## 2023-09-11 DIAGNOSIS — Z4789 Encounter for other orthopedic aftercare: Secondary | ICD-10-CM | POA: Diagnosis not present

## 2023-09-11 DIAGNOSIS — F419 Anxiety disorder, unspecified: Secondary | ICD-10-CM | POA: Diagnosis not present

## 2023-09-11 DIAGNOSIS — B9561 Methicillin susceptible Staphylococcus aureus infection as the cause of diseases classified elsewhere: Secondary | ICD-10-CM | POA: Diagnosis not present

## 2023-09-11 NOTE — Patient Outreach (Signed)
Care Management  Transitions of Care Program Transitions of Care Post-discharge week 2   09/11/2023 Name: Dan Sanchez MRN: [id number] DOB: [birth year]  Subjective: Dan Sanchez is a 67 y.o. year old male who is a primary care patient of Tabori, Helane Rima, MD. The Care Management team Engaged with patient Engaged with patient by telephone to assess and address transitions of care needs.   Consent to Services:  Patient was given information about care management services, agreed to services, and gave verbal consent to participate.   Assessment:     Patient voices no new complaints Patient has not developed/ reported any new Medical issues / Dx or acute changes.- since last follow-up call for  Hospital stay     08/29/23-10/7 / 2024  Patient educated on red flags s/s to watch for and was encouraged to report any of these identified , any new symptoms , changes in baseline or  medication regimen,  change in health status  /  well-being, or safety concerns to PCP and / or the  VBCI Case Management team . Reviewed goals for care including follow-up appointments,  lab work as ordered, on-going  IV ABT ( TID )  Patient provided with Contact information and verbalized understanding with current POC.       SDOH Interventions    Flowsheet Row Telephone from 09/11/2023 in [place] POPULATION HEALTH DEPARTMENT Telephone from 09/08/2023 in [place] POPULATION HEALTH DEPARTMENT  SDOH Interventions    Food Insecurity Interventions -- Patient Declined, Intervention Not Indicated  Housing Interventions -- Intervention Not Indicated  Transportation Interventions Intervention Not Indicated --  Health Literacy Interventions Intervention Not Indicated --        Goals Addressed             This Visit's Progress    TOC Care Plan       Current Barriers:  New PICC line and requiring IV ABT x 45 days  RNCM Clinical Goal(s):  Patient will work with the Care Management team over the next  30 days to address Transition of Care Barriers: Medication Management Provider appointments Home Health services take all medications exactly as prescribed and will call provider for medication related questions as evidenced by no adverse or uncontrolled side effects , no missed medications, lab draws as ordered  attend all scheduled medical appointments: with Infectious Disease  as evidenced by no missed appts  through collaboration with RN Care manager, provider, and care team.   Interventions: Evaluation of current treatment plan related to  self management and patient's adherence to plan as established by provider  Transitions of Care:  Goal on track:  Yes. Notify Home Health , or VBCI CM for any changes in PICC line , s/s of infection.SOB or pain   Patient Goals/Self-Care Activities: Participate in Transition of Care Program/Attend TOC scheduled calls Take all medications as prescribed Attend all scheduled provider appointments Call pharmacy for medication refills 3-7 days in advance of running out of medications Perform IADL's (shopping, preparing meals, housekeeping, managing finances) independently Call provider office for new concerns or questions   Follow Up Plan:  Telephone follow up appointment with care management team member scheduled for:  09/18/23   2 pm as requested by patient  Follow up with provider re: any changes in medication dose, s/s of infection, change in Mental status, elevated Temp, or change in HR or B/P           Plan: The patient has been provided with  contact information for the care management team and has been advised to call with any health related questions or concerns.   Susa Loffler , BSN, RN Care Management Coordinator [place]   [facility] [email or web address] Direct Dial: [phone]

## 2023-09-11 NOTE — Telephone Encounter (Signed)
Pt notes reading hospital discharge papers and noted bladder and bone infection asking if this could be the reason for the PSA number on 08/28/23  Please advise

## 2023-09-13 ENCOUNTER — Ambulatory Visit: Payer: Medicare Other | Admitting: Physical Therapy

## 2023-09-13 NOTE — Telephone Encounter (Signed)
Spoke with Denyse Amass at [facility]. She will add this PSA

## 2023-09-14 ENCOUNTER — Telehealth: Payer: Self-pay

## 2023-09-14 NOTE — Telephone Encounter (Signed)
Patient called office requesting copy of labs done by Home Health. Would like to ask Dr. Drue Second or pharmacy team if labs  are improving from hospital visit. Will forward message to them to review results. Labs from 10/14 are in pharmacy box for review.  Patient would like call back.  Juanita Laster, RMA

## 2023-09-14 NOTE — Telephone Encounter (Signed)
Spoke with patient regarding message from [place]. Ensured patient that we would reach out with any abnormalities. Verbalized understanding. Would like message sent on mychart for his wife to see. Juanita Laster, RMA

## 2023-09-14 NOTE — Telephone Encounter (Signed)
Stamped and faxed.

## 2023-09-14 NOTE — Telephone Encounter (Signed)
[facility] sent fax requesting signature for requested lab orders.  Placed in the Provider file at front office

## 2023-09-14 NOTE — Telephone Encounter (Signed)
Labs look great from 10/14; we received their fax earlier this week. ESR and CRP has decreased since hospitalization.

## 2023-09-15 DIAGNOSIS — G061 Intraspinal abscess and granuloma: Secondary | ICD-10-CM | POA: Diagnosis not present

## 2023-09-18 ENCOUNTER — Other Ambulatory Visit: Payer: Self-pay

## 2023-09-18 ENCOUNTER — Telehealth: Payer: Self-pay

## 2023-09-18 DIAGNOSIS — M6089 Other myositis, multiple sites: Secondary | ICD-10-CM | POA: Diagnosis not present

## 2023-09-18 DIAGNOSIS — B9561 Methicillin susceptible Staphylococcus aureus infection as the cause of diseases classified elsewhere: Secondary | ICD-10-CM | POA: Diagnosis not present

## 2023-09-18 DIAGNOSIS — F419 Anxiety disorder, unspecified: Secondary | ICD-10-CM | POA: Diagnosis not present

## 2023-09-18 DIAGNOSIS — E43 Unspecified severe protein-calorie malnutrition: Secondary | ICD-10-CM | POA: Diagnosis not present

## 2023-09-18 DIAGNOSIS — R634 Abnormal weight loss: Secondary | ICD-10-CM | POA: Diagnosis not present

## 2023-09-18 DIAGNOSIS — Z4789 Encounter for other orthopedic aftercare: Secondary | ICD-10-CM | POA: Diagnosis not present

## 2023-09-18 DIAGNOSIS — M0008 Staphylococcal arthritis, vertebrae: Secondary | ICD-10-CM | POA: Diagnosis not present

## 2023-09-18 DIAGNOSIS — D649 Anemia, unspecified: Secondary | ICD-10-CM | POA: Diagnosis not present

## 2023-09-18 DIAGNOSIS — M47814 Spondylosis without myelopathy or radiculopathy, thoracic region: Secondary | ICD-10-CM | POA: Diagnosis not present

## 2023-09-18 DIAGNOSIS — Z452 Encounter for adjustment and management of vascular access device: Secondary | ICD-10-CM | POA: Diagnosis not present

## 2023-09-18 DIAGNOSIS — I2510 Atherosclerotic heart disease of native coronary artery without angina pectoris: Secondary | ICD-10-CM | POA: Diagnosis not present

## 2023-09-18 DIAGNOSIS — M4726 Other spondylosis with radiculopathy, lumbar region: Secondary | ICD-10-CM | POA: Diagnosis not present

## 2023-09-18 DIAGNOSIS — R972 Elevated prostate specific antigen [PSA]: Secondary | ICD-10-CM | POA: Diagnosis not present

## 2023-09-18 DIAGNOSIS — M48061 Spinal stenosis, lumbar region without neurogenic claudication: Secondary | ICD-10-CM | POA: Diagnosis not present

## 2023-09-18 DIAGNOSIS — I10 Essential (primary) hypertension: Secondary | ICD-10-CM | POA: Diagnosis not present

## 2023-09-18 DIAGNOSIS — E785 Hyperlipidemia, unspecified: Secondary | ICD-10-CM | POA: Diagnosis not present

## 2023-09-18 DIAGNOSIS — Z792 Long term (current) use of antibiotics: Secondary | ICD-10-CM | POA: Diagnosis not present

## 2023-09-18 DIAGNOSIS — G061 Intraspinal abscess and granuloma: Secondary | ICD-10-CM | POA: Diagnosis not present

## 2023-09-18 NOTE — Patient Outreach (Signed)
Care Management  Transitions of Care Program Transitions of Care Post-discharge week 3   09/18/2023 Name: Dan Sanchez MRN: [id number] DOB: Jan 17, 1956  Subjective: Dan Sanchez is a 67 y.o. year old male who is a primary care patient of Tabori, Helane Rima, MD. The Care Management team Engaged with patient Engaged with patient by telephone to assess and address transitions of care needs.   Consent to Services:  Patient was given information about care management services, agreed to services, and gave verbal consent to participate.   Assessment:   Patient voices no new complaints Patient has not developed/ reported any new Medical issues / Dx or acute changes.- since last follow-up call for most recent  He does report a 7 lb weight gain, denies any LE edema, cough ,SOB. He continues to be followed by [facility] and states Nurse visited today. He had recent labs was upset he was not able to see labs. He reports he wanted to view them and thought they were visible on my chart. He did receive a copy from Infectious disease. Next appt 09/27/23  Hospital stay    10/1-10/7/ 2024  Patient educated on red flags s/s to watch for and was encouraged to report any of these identified , any new symptoms , changes in baseline or  medication regimen,  change in health status  /  well-being, or safety concerns to PCP and / or the  VBCI Case Management team .          SDOH Interventions    Flowsheet Row Telephone from 09/11/2023 in [place] POPULATION HEALTH DEPARTMENT Telephone from 09/08/2023 in [place] POPULATION HEALTH DEPARTMENT  SDOH Interventions    Food Insecurity Interventions -- Patient Declined, Intervention Not Indicated  Housing Interventions -- Intervention Not Indicated  Transportation Interventions Intervention Not Indicated --  Health Literacy Interventions Intervention Not Indicated --        Goals Addressed             This Visit's Progress    TOC Care Plan        Current Barriers:  New PICC line and requiring IV ABT x 45 days- next appt with Infectious disease 09/27/23 Recent labs completed   RNCM Clinical Goal(s):  Patient will work with the Care Management team over the next 30 days to address Transition of Care Barriers: Medication Management Provider appointments Home Health services take all medications exactly as prescribed and will call provider for medication related questions as evidenced by no adverse or uncontrolled side effects , no missed medications, lab draws as ordered  attend all scheduled medical appointments: with Infectious Disease  as evidenced by no missed appts  through collaboration with RN Care manager, provider, and care team.   Interventions: Evaluation of current treatment plan related to  self management and patient's adherence to plan as established by provider  Transitions of Care:  Goal on track:  Yes. Notify Home Health , or VBCI CM for any changes in PICC line , s/s of infection.SOB or pain   Patient Goals/Self-Care Activities: Participate in Transition of Care Program/Attend TOC scheduled calls Take all medications as prescribed Attend all scheduled provider appointments Call pharmacy for medication refills 3-7 days in advance of running out of medications Perform IADL's (shopping, preparing meals, housekeeping, managing finances) independently Call provider office for new concerns or questions   Follow Up Plan:  Telephone follow up appointment with care management team member scheduled for:  09/28/23   2 pm as  requested by patient  Follow up with provider re: any changes in medication dose, s/s of infection, change in Mental status, elevated Temp, or change in HR or B/P         Routine follow-up and on-going assessment evaluation and education of disease processes, recommended interventions for both chronic and acute medical conditions , will occur during each  visit along with ongoing  management of symptoms  ,medication reviews and reconciliation. Will make changes and updates episodically to Care Plan and initiate visits / follow-up as indicated .   Patient provided with Contact information for VBCI CM   Reviewed goals for care , patient   verbalized understanding and agreement with current POC.   Plan: The patient has been provided with contact information for the care management team and has been advised to call with any health related questions or concerns.   Susa Loffler , BSN, RN Care Management Coordinator [place]   [facility] [email or web address] Direct Dial: [phone]

## 2023-09-18 NOTE — Patient Outreach (Signed)
Care Management  Transitions of Care Program Transitions of Care Post-discharge week 3  09/18/2023 Name: JEHAD BAACK MRN: [id number] DOB: 05-11-1956  Subjective: Veverly Fells Bohman is a 67 y.o. year old male who is a primary care patient of Beverely Low, Helane Rima, MD. The Care Management team was unable to reach the patient by phone to assess and address transitions of care needs. Week 3 TOC follow-up  Plan: Additional outreach attempts will be made to reach the patient enrolled in the [facility] Program (Post Inpatient/ED Visit).  Susa Loffler , BSN, RN Care Management Coordinator [place]   [facility] [email or web address] Direct Dial: [phone]

## 2023-09-18 NOTE — Patient Instructions (Signed)
Visit Information  Thank you for taking time to visit with me today. Please don't hesitate to contact me if I can be of assistance to you before our next scheduled telephone appointment.  Our next appointment is by telephone on 09/28/23 at 2:00pm  Following is a copy of your care plan:   Goals Addressed             This Visit's Progress    TOC Care Plan       Current Barriers:  New PICC line and requiring IV ABT x 45 days- next appt with Infectious disease 09/27/23 Recent labs completed   RNCM Clinical Goal(s):  Patient will work with the Care Management team over the next 30 days to address Transition of Care Barriers: Medication Management Provider appointments Home Health services take all medications exactly as prescribed and will call provider for medication related questions as evidenced by no adverse or uncontrolled side effects , no missed medications, lab draws as ordered  attend all scheduled medical appointments: with Infectious Disease  as evidenced by no missed appts  through collaboration with RN Care manager, provider, and care team.   Interventions: Evaluation of current treatment plan related to  self management and patient's adherence to plan as established by provider  Transitions of Care:  Goal on track:  Yes. Notify Home Health , or VBCI CM for any changes in PICC line , s/s of infection.SOB or pain   Patient Goals/Self-Care Activities: Participate in Transition of Care Program/Attend TOC scheduled calls Take all medications as prescribed Attend all scheduled provider appointments Call pharmacy for medication refills 3-7 days in advance of running out of medications Perform IADL's (shopping, preparing meals, housekeeping, managing finances) independently Call provider office for new concerns or questions   Follow Up Plan:  Telephone follow up appointment with care management team member scheduled for:  09/28/23   2 pm as requested by patient  Follow up  with provider re: any changes in medication dose, s/s of infection, change in Mental status, elevated Temp, or change in HR or B/P           Patient verbalizes understanding of instructions and care plan provided today and agrees to view in MyChart. Active MyChart status and patient understanding of how to access instructions and care plan via MyChart confirmed with patient.     The patient has been provided with contact information for the care management team and has been advised to call with any health related questions or concerns.   Please call the care guide team at [phone] if you need to cancel or reschedule your appointment.   Please call the [place] National Suicide Prevention Lifeline: 365-386-9022 or TTY: 630-584-2060 TTY 450 503 4508) to talk to a trained counselor if you are experiencing a Mental Health or Behavioral Health Crisis or need someone to talk to. Susa Loffler , BSN, RN Care Management Coordinator [place]   [facility] [email or web address] Direct Dial: [phone]

## 2023-09-20 ENCOUNTER — Other Ambulatory Visit: Payer: Self-pay

## 2023-09-20 ENCOUNTER — Other Ambulatory Visit (HOSPITAL_BASED_OUTPATIENT_CLINIC_OR_DEPARTMENT_OTHER): Payer: Self-pay

## 2023-09-20 ENCOUNTER — Encounter: Payer: Self-pay | Admitting: Family Medicine

## 2023-09-20 LAB — LAB REPORT - SCANNED: EGFR: 97

## 2023-09-23 DIAGNOSIS — G061 Intraspinal abscess and granuloma: Secondary | ICD-10-CM | POA: Diagnosis not present

## 2023-09-24 ENCOUNTER — Other Ambulatory Visit: Payer: Self-pay

## 2023-09-24 ENCOUNTER — Emergency Department (HOSPITAL_COMMUNITY)
Admission: EM | Admit: 2023-09-24 | Discharge: 2023-09-24 | Disposition: A | Discharge status: Home / Self Care | Payer: Medicare Other | Attending: Emergency Medicine | Admitting: Emergency Medicine

## 2023-09-24 ENCOUNTER — Encounter (HOSPITAL_COMMUNITY): Payer: Self-pay

## 2023-09-24 ENCOUNTER — Emergency Department (HOSPITAL_COMMUNITY): Payer: Medicare Other

## 2023-09-24 DIAGNOSIS — G061 Intraspinal abscess and granuloma: Secondary | ICD-10-CM | POA: Diagnosis not present

## 2023-09-24 DIAGNOSIS — M4726 Other spondylosis with radiculopathy, lumbar region: Secondary | ICD-10-CM | POA: Diagnosis not present

## 2023-09-24 DIAGNOSIS — D649 Anemia, unspecified: Secondary | ICD-10-CM | POA: Diagnosis not present

## 2023-09-24 DIAGNOSIS — I10 Essential (primary) hypertension: Secondary | ICD-10-CM | POA: Diagnosis not present

## 2023-09-24 DIAGNOSIS — Z7982 Long term (current) use of aspirin: Secondary | ICD-10-CM | POA: Insufficient documentation

## 2023-09-24 DIAGNOSIS — B9561 Methicillin susceptible Staphylococcus aureus infection as the cause of diseases classified elsewhere: Secondary | ICD-10-CM | POA: Diagnosis not present

## 2023-09-24 DIAGNOSIS — I2510 Atherosclerotic heart disease of native coronary artery without angina pectoris: Secondary | ICD-10-CM | POA: Diagnosis not present

## 2023-09-24 DIAGNOSIS — M47814 Spondylosis without myelopathy or radiculopathy, thoracic region: Secondary | ICD-10-CM | POA: Diagnosis not present

## 2023-09-24 DIAGNOSIS — Z452 Encounter for adjustment and management of vascular access device: Secondary | ICD-10-CM | POA: Diagnosis not present

## 2023-09-24 DIAGNOSIS — Y69 Unspecified misadventure during surgical and medical care: Secondary | ICD-10-CM | POA: Insufficient documentation

## 2023-09-24 DIAGNOSIS — T82898A Other specified complication of vascular prosthetic devices, implants and grafts, initial encounter: Secondary | ICD-10-CM | POA: Diagnosis not present

## 2023-09-24 DIAGNOSIS — F419 Anxiety disorder, unspecified: Secondary | ICD-10-CM | POA: Diagnosis not present

## 2023-09-24 DIAGNOSIS — E785 Hyperlipidemia, unspecified: Secondary | ICD-10-CM | POA: Diagnosis not present

## 2023-09-24 DIAGNOSIS — R634 Abnormal weight loss: Secondary | ICD-10-CM | POA: Diagnosis not present

## 2023-09-24 DIAGNOSIS — M6089 Other myositis, multiple sites: Secondary | ICD-10-CM | POA: Diagnosis not present

## 2023-09-24 DIAGNOSIS — E43 Unspecified severe protein-calorie malnutrition: Secondary | ICD-10-CM | POA: Diagnosis not present

## 2023-09-24 DIAGNOSIS — M48061 Spinal stenosis, lumbar region without neurogenic claudication: Secondary | ICD-10-CM | POA: Diagnosis not present

## 2023-09-24 DIAGNOSIS — Z4789 Encounter for other orthopedic aftercare: Secondary | ICD-10-CM | POA: Diagnosis not present

## 2023-09-24 DIAGNOSIS — T82594A Other mechanical complication of infusion catheter, initial encounter: Secondary | ICD-10-CM | POA: Diagnosis not present

## 2023-09-24 DIAGNOSIS — M0008 Staphylococcal arthritis, vertebrae: Secondary | ICD-10-CM | POA: Diagnosis not present

## 2023-09-24 MED ORDER — CEFAZOLIN SODIUM-DEXTROSE 2-4 GM/100ML-% IV SOLN
2.0000 g | Freq: Once | INTRAVENOUS | Status: AC
  Administered 2023-09-24: 2 g via INTRAVENOUS
  Filled 2023-09-24: qty 100

## 2023-09-24 MED ORDER — ALTEPLASE 2 MG IJ SOLR
2.0000 mg | Freq: Once | INTRAMUSCULAR | Status: AC
  Administered 2023-09-24: 2 mg
  Filled 2023-09-24: qty 2

## 2023-09-24 MED ORDER — ALTEPLASE 2 MG IJ SOLR: 2.0000 mg | Freq: Once | INTRAMUSCULAR | Status: DC

## 2023-09-24 NOTE — ED Triage Notes (Signed)
Pt came in POV d/t pickline issues. Pt stated he had pickline placed about 3 weeks ago d/t bone marrow infection . Pt has been receiving IV antibiotics 3 times a day through the line. Per pt, it was working till this morning. Pt tried to flush it and it did not flush. Pt was advised to come here by his home health nurse. No signs of infiltration at the site.

## 2023-09-24 NOTE — Discharge Instructions (Signed)
1.  Follow care instructions for your PICC line as outlined by PICC nurse and home care instructions.

## 2023-09-24 NOTE — ED Provider Notes (Signed)
[place] EMERGENCY DEPARTMENT AT [facility] Provider Note   CSN: [id number] Arrival date & time: 09/24/23  4098     History  Chief Complaint  Patient presents with   Vascular Access Problem    Dan Sanchez is a 67 y.o. male.  Six 4-year-old male with prior medical history as detailed below presents for evaluation.  Patient with PICC line in right upper extremity.  Patient on IV antibiotics until November for treatment of MSSA epidural abscess diagnosed earlier this month.  Patient received 2 g of Ancef every 8 hours.  Patient reports that the PICC line functions well yesterday evening.  This morning he was unable to access it.  Otherwise patient without complaint.  The history is provided by the patient and medical records.       Home Medications Prior to Admission medications   Medication Sig Start Date End Date Taking? Authorizing Provider  acetaminophen (TYLENOL) 325 MG tablet Take 2 tablets (650 mg total) by mouth every 8 (eight) hours as needed for mild pain. 09/04/23   Almon Hercules, MD  amLODipine (NORVASC) 10 MG tablet Take 1 tablet (10 mg total) by mouth daily. 12/02/22 12/20/23  Kathleene Hazel, MD  ascorbic acid (VITAMIN C) 1000 MG tablet Take by mouth. 02/01/19   [provider]  aspirin EC 81 MG tablet Take 1 tablet (81 mg total) by mouth daily. Swallow whole. 10/26/20   Kathleene Hazel, MD  ceFAZolin (ANCEF) IVPB Inject 2 g into the vein every 8 (eight) hours. Indication:  MSSA epidural abscess First Dose: Yes Last Day of Therapy:  10/11/23 Labs - Once weekly:  CBC/D and BMP, Labs - Once weekly: ESR and CRP Method of administration: IV Push Method of administration may be changed at the discretion of home infusion pharmacist based upon assessment of the patient and/or caregiver's ability to self-administer the medication ordered. 09/04/23 10/11/23  Almon Hercules, MD  celecoxib (CELEBREX) 200 MG capsule Take 1 capsule (200 mg  total) by mouth 2 (two) times daily. Patient not taking: Reported on 09/08/2023 08/16/23     fexofenadine (ALLEGRA) 180 MG tablet Take 180 mg by mouth daily.    [provider]  fluticasone (FLONASE) 50 MCG/ACT nasal spray Place 2 sprays into both nostrils daily. 06/28/18   Waldon Merl, PA-C  Omega-3 Fatty Acids (FISH OIL) 1200 MG CAPS Take 2,400 mg by mouth 2 (two) times daily.    [provider]  omeprazole (PRILOSEC) 20 MG capsule Take 1 capsule (20 mg total) by mouth daily. 04/18/23 04/17/24  Sheliah Hatch, MD  rosuvastatin (CRESTOR) 40 MG tablet Take 1 tablet (40 mg total) by mouth daily. 12/02/22   Kathleene Hazel, MD  rosuvastatin (CRESTOR) 40 MG tablet Take 1 tablet (40 mg total) by mouth daily. Patient not taking: Reported on 09/08/2023 09/18/23   Almon Hercules, MD      Allergies    Bee venom    Review of Systems   Review of Systems  All other systems reviewed and are negative.   Physical Exam Updated Vital Signs BP (!) 161/85 (BP Location: Left Arm)   Pulse 85   Temp 98.2 F (36.8 C) (Oral)   Resp 16   SpO2 100%  Physical Exam Vitals and nursing note reviewed.  Constitutional:      General: He is not in acute distress.    Appearance: Normal appearance. He is well-developed.  HENT:     Head: Normocephalic  and atraumatic.  Eyes:     Conjunctiva/sclera: Conjunctivae normal.     Pupils: Pupils are equal, round, and reactive to light.  Cardiovascular:     Rate and Rhythm: Normal rate and regular rhythm.     Heart sounds: Normal heart sounds.  Pulmonary:     Effort: Pulmonary effort is normal. No respiratory distress.     Breath sounds: Normal breath sounds.  Abdominal:     General: There is no distension.     Palpations: Abdomen is soft.     Tenderness: There is no abdominal tenderness.  Musculoskeletal:        General: No deformity. Normal range of motion.     Cervical back: Normal range of motion and neck supple.  Skin:     General: Skin is warm and dry.  Neurological:     General: No focal deficit present.     Mental Status: He is alert and oriented to person, place, and time.     ED Results / Procedures / Treatments   Labs (all labs ordered are listed, but only abnormal results are displayed) Labs Reviewed - No data to display  EKG None  Radiology No results found.  Procedures Procedures    Medications Ordered in ED Medications  alteplase (CATHFLO ACTIVASE) injection 2 mg (2 mg Intracatheter Given 09/24/23 1103)    ED Course/ Medical Decision Making/ A&P                                 Medical Decision Making Amount and/or Complexity of Data Reviewed Radiology: ordered.  Risk Prescription drug management.    Medical Screen Complete  This patient presented to the ED with complaint of inoperable PICC line.  This complaint involves an extensive number of treatment options. The initial differential diagnosis includes, but is not limited to, malfunction of PICC line  This presentation is: Acute, Self-Limited, Previously Undiagnosed, and Uncertain Prognosis  Patient on IV antibiotics every 8 hours for treatment of MSSA epidural abscess.  Patient with malfunction of PICC line this morning.  Patient to the ED today for evaluation of PICC line.  Team involved.  Patient receiving tPA through the PICC to help declog it.  X-ray is suggestive of possible mechanical kink.  Oncoming EDP is aware of case.    If line can become functional patient is appropriate for discharge.  If line remains blocked, patient may require admission for replacement of PICC line given need for continuous every 8 hour IV antibiotic infusions.  Additional history obtained:  External records from outside sources obtained and reviewed including prior ED visits and prior Inpatient records.    Imaging Studies ordered:  I ordered imaging studies including CXR  I independently visualized and interpreted  obtained imaging which showed possible mechanical kink in PICC line I agree with the radiologist interpretation.   Medicines ordered:  I ordered medication including Ancef for MSSA epidural abscess Reevaluation of the patient after these medicines showed that the patient: stayed the same    Problem List / ED Course:  Malfunction of PICC line   Reevaluation:  After the interventions noted above, I reevaluated the patient and found that they have: stayed the same   Disposition:  After consideration of the diagnostic results and the patients response to treatment, I feel that the patent would benefit from completion of ED evaluation.          Final Clinical Impression(s) /  ED Diagnoses Final diagnoses:  Occlusion of peripherally inserted central catheter (PICC) line, initial encounter [facility])    Rx / DC Orders ED Discharge Orders     None         Wynetta Fines, MD 09/24/23 [phone]

## 2023-09-24 NOTE — ED Notes (Signed)
Diet tray ordered 

## 2023-09-24 NOTE — Progress Notes (Signed)
Peripherally Inserted Central Catheter Placement  The IV Nurse has discussed with the patient and/or persons authorized to consent for the patient, the purpose of this procedure and the potential benefits and risks involved with this procedure.  The benefits include less needle sticks, lab draws from the catheter, and the patient may be discharged home with the catheter. Risks include, but not limited to, infection, bleeding, blood clot (thrombus formation), and puncture of an artery; nerve damage and irregular heartbeat and possibility to perform a PICC exchange if needed/ordered by physician.  Alternatives to this procedure were also discussed.  Bard Power PICC patient education guide, fact sheet on infection prevention and patient information card has been provided to patient /or left at bedside.    PICC Placement Documentation  PICC Single Lumen 09/24/23 Right Basilic 38 cm 0 cm (Active)  Indication for Insertion or Continuance of Line Home intravenous therapies (PICC only) 09/24/23 1712  Exposed Catheter (cm) 0 cm 09/24/23 1712  Site Assessment Clean, Dry, Intact 09/24/23 1712  Line Status Flushed;Saline locked;Blood return noted 09/24/23 1712  Dressing Type Transparent;Securing device 09/24/23 1712  Dressing Status Antimicrobial disc in place;Clean, Dry, Intact 09/24/23 1712  Line Care Connections checked and tightened 09/24/23 1712  Line Adjustment (NICU/IV Team Only) No 09/24/23 1712  Dressing Intervention New dressing 09/24/23 1712  Dressing Change Due 10/01/23 09/24/23 1712       Elliot Dally 09/24/2023, 5:12 PM

## 2023-09-24 NOTE — ED Provider Notes (Signed)
Getting PICC line eval TX by PICC team. Getting Ancef 2gm q 8 for MSSA epidural abscess. Physical Exam  BP (!) 161/85 (BP Location: Left Arm)   Pulse 85   Temp 98.2 F (36.8 C) (Oral)   Resp 16   SpO2 100%   Physical Exam  Procedures  Procedures  ED Course / MDM    Medical Decision Making Amount and/or Complexity of Data Reviewed Radiology: ordered.  Risk Prescription drug management.  Vascular team has established PICC line and patient is ready for discharge.        Arby Barrette, MD 09/24/23 [phone]

## 2023-09-25 DIAGNOSIS — M0008 Staphylococcal arthritis, vertebrae: Secondary | ICD-10-CM | POA: Diagnosis not present

## 2023-09-25 DIAGNOSIS — M47814 Spondylosis without myelopathy or radiculopathy, thoracic region: Secondary | ICD-10-CM | POA: Diagnosis not present

## 2023-09-25 DIAGNOSIS — I10 Essential (primary) hypertension: Secondary | ICD-10-CM | POA: Diagnosis not present

## 2023-09-25 DIAGNOSIS — Z452 Encounter for adjustment and management of vascular access device: Secondary | ICD-10-CM | POA: Diagnosis not present

## 2023-09-25 DIAGNOSIS — Z792 Long term (current) use of antibiotics: Secondary | ICD-10-CM | POA: Diagnosis not present

## 2023-09-25 DIAGNOSIS — D649 Anemia, unspecified: Secondary | ICD-10-CM | POA: Diagnosis not present

## 2023-09-25 DIAGNOSIS — M6089 Other myositis, multiple sites: Secondary | ICD-10-CM | POA: Diagnosis not present

## 2023-09-25 DIAGNOSIS — F419 Anxiety disorder, unspecified: Secondary | ICD-10-CM | POA: Diagnosis not present

## 2023-09-25 DIAGNOSIS — R634 Abnormal weight loss: Secondary | ICD-10-CM | POA: Diagnosis not present

## 2023-09-25 DIAGNOSIS — E43 Unspecified severe protein-calorie malnutrition: Secondary | ICD-10-CM | POA: Diagnosis not present

## 2023-09-25 DIAGNOSIS — M4726 Other spondylosis with radiculopathy, lumbar region: Secondary | ICD-10-CM | POA: Diagnosis not present

## 2023-09-25 DIAGNOSIS — G061 Intraspinal abscess and granuloma: Secondary | ICD-10-CM | POA: Diagnosis not present

## 2023-09-25 DIAGNOSIS — Z4789 Encounter for other orthopedic aftercare: Secondary | ICD-10-CM | POA: Diagnosis not present

## 2023-09-25 DIAGNOSIS — I2510 Atherosclerotic heart disease of native coronary artery without angina pectoris: Secondary | ICD-10-CM | POA: Diagnosis not present

## 2023-09-25 DIAGNOSIS — B9561 Methicillin susceptible Staphylococcus aureus infection as the cause of diseases classified elsewhere: Secondary | ICD-10-CM | POA: Diagnosis not present

## 2023-09-25 DIAGNOSIS — E785 Hyperlipidemia, unspecified: Secondary | ICD-10-CM | POA: Diagnosis not present

## 2023-09-25 DIAGNOSIS — M48061 Spinal stenosis, lumbar region without neurogenic claudication: Secondary | ICD-10-CM | POA: Diagnosis not present

## 2023-09-26 ENCOUNTER — Other Ambulatory Visit: Payer: Self-pay

## 2023-09-26 ENCOUNTER — Other Ambulatory Visit (HOSPITAL_BASED_OUTPATIENT_CLINIC_OR_DEPARTMENT_OTHER): Payer: Self-pay

## 2023-09-26 ENCOUNTER — Ambulatory Visit: Payer: Medicare Other | Admitting: Internal Medicine

## 2023-09-26 ENCOUNTER — Encounter: Payer: Self-pay | Admitting: Internal Medicine

## 2023-09-26 ENCOUNTER — Telehealth: Payer: Self-pay

## 2023-09-26 ENCOUNTER — Inpatient Hospital Stay: Payer: Medicare Other | Admitting: Internal Medicine

## 2023-09-26 VITALS — BP 147/82 | HR 77 | Temp 98.7°F | Wt 183.0 lb

## 2023-09-26 DIAGNOSIS — R7881 Bacteremia: Secondary | ICD-10-CM | POA: Diagnosis not present

## 2023-09-26 DIAGNOSIS — G061 Intraspinal abscess and granuloma: Secondary | ICD-10-CM

## 2023-09-26 DIAGNOSIS — B9561 Methicillin susceptible Staphylococcus aureus infection as the cause of diseases classified elsewhere: Secondary | ICD-10-CM | POA: Diagnosis not present

## 2023-09-26 MED ORDER — DOXYCYCLINE HYCLATE 100 MG PO TABS
100.0000 mg | ORAL_TABLET | Freq: Two times a day (BID) | ORAL | 1 refills | Status: DC
  Filled 2023-09-26: qty 60, 30d supply, fill #0
  Filled 2023-11-08: qty 60, 30d supply, fill #1

## 2023-09-26 NOTE — Telephone Encounter (Signed)
Per Dr. Drue Second community message forwarded to [facility] team with orders to pull picc after last dose 11/13.  Juanita Laster, RMA

## 2023-09-27 ENCOUNTER — Other Ambulatory Visit (HOSPITAL_BASED_OUTPATIENT_CLINIC_OR_DEPARTMENT_OTHER): Payer: Self-pay

## 2023-09-27 DIAGNOSIS — M0008 Staphylococcal arthritis, vertebrae: Secondary | ICD-10-CM | POA: Diagnosis not present

## 2023-09-27 DIAGNOSIS — F419 Anxiety disorder, unspecified: Secondary | ICD-10-CM | POA: Diagnosis not present

## 2023-09-27 DIAGNOSIS — M6089 Other myositis, multiple sites: Secondary | ICD-10-CM | POA: Diagnosis not present

## 2023-09-27 DIAGNOSIS — Z452 Encounter for adjustment and management of vascular access device: Secondary | ICD-10-CM | POA: Diagnosis not present

## 2023-09-27 DIAGNOSIS — M48061 Spinal stenosis, lumbar region without neurogenic claudication: Secondary | ICD-10-CM | POA: Diagnosis not present

## 2023-09-27 DIAGNOSIS — B9561 Methicillin susceptible Staphylococcus aureus infection as the cause of diseases classified elsewhere: Secondary | ICD-10-CM | POA: Diagnosis not present

## 2023-09-27 DIAGNOSIS — I2510 Atherosclerotic heart disease of native coronary artery without angina pectoris: Secondary | ICD-10-CM | POA: Diagnosis not present

## 2023-09-27 DIAGNOSIS — E785 Hyperlipidemia, unspecified: Secondary | ICD-10-CM | POA: Diagnosis not present

## 2023-09-27 DIAGNOSIS — G061 Intraspinal abscess and granuloma: Secondary | ICD-10-CM | POA: Diagnosis not present

## 2023-09-27 DIAGNOSIS — I10 Essential (primary) hypertension: Secondary | ICD-10-CM | POA: Diagnosis not present

## 2023-09-27 DIAGNOSIS — D649 Anemia, unspecified: Secondary | ICD-10-CM | POA: Diagnosis not present

## 2023-09-27 DIAGNOSIS — M4726 Other spondylosis with radiculopathy, lumbar region: Secondary | ICD-10-CM | POA: Diagnosis not present

## 2023-09-27 DIAGNOSIS — R634 Abnormal weight loss: Secondary | ICD-10-CM | POA: Diagnosis not present

## 2023-09-27 DIAGNOSIS — E43 Unspecified severe protein-calorie malnutrition: Secondary | ICD-10-CM | POA: Diagnosis not present

## 2023-09-27 DIAGNOSIS — M47814 Spondylosis without myelopathy or radiculopathy, thoracic region: Secondary | ICD-10-CM | POA: Diagnosis not present

## 2023-09-27 DIAGNOSIS — Z4789 Encounter for other orthopedic aftercare: Secondary | ICD-10-CM | POA: Diagnosis not present

## 2023-09-28 ENCOUNTER — Telehealth: Payer: Self-pay

## 2023-09-28 ENCOUNTER — Other Ambulatory Visit: Payer: Self-pay

## 2023-09-28 NOTE — Patient Outreach (Signed)
Care Management  Transitions of Care Program Transitions of Care Post-discharge week 3   09/28/2023 Name: Dan Sanchez MRN: [id number] DOB: 03/15/1956  Subjective: Dan Sanchez is a 67 y.o. year old male who is a primary care patient of Tabori, Helane Rima, MD. The Care Management team Engaged with patient Engaged with patient by telephone to assess and address transitions of care needs.   Consent to Services:  Patient was given information about care management services, agreed to services, and gave verbal consent to participate.   Assessment:   Patient voices no new complaints Patient has not developed/ reported any new Medical issues / Dx or acute changes.- since last follow-up call for most recent  Hospital stay     10/1-10/7 / 2024  He was seen by ID 09/26/23 Blood cultures neg. He continues it IV Ancef thru 11/13, and will begin PO ABT 11/14. Labs progressing well. He has no concerns HH draws labs on Monday.  He is currently @ hospital w/ spouse who had knee surgery today.  Patient educated on red flags s/s to watch for and was encouraged to report any of these identified , any new symptoms , changes in baseline or  medication regimen,  change in health status  /  well-being, or safety concerns to PCP and / or the  VBCI Case Management team .          SDOH Interventions    Flowsheet Row Telephone from 09/11/2023 in [place] POPULATION HEALTH DEPARTMENT Telephone from 09/08/2023 in [place] POPULATION HEALTH DEPARTMENT  SDOH Interventions    Food Insecurity Interventions -- Patient Declined, Intervention Not Indicated  Housing Interventions -- Intervention Not Indicated  Transportation Interventions Intervention Not Indicated --  Health Literacy Interventions Intervention Not Indicated --        Goals Addressed             This Visit's Progress    TOC Care Plan       Current Barriers:  New PICC line and requiring IV ABT x 45 days- next appt with Infectious  disease 09/27/23- completed BC negative  Recent labs completed   RNCM Clinical Goal(s):  Patient will work with the Care Management team over the next 30 days to address Transition of Care Barriers: Medication Management Provider appointments Home Health services take all medications exactly as prescribed and will call provider for medication related questions as evidenced by no adverse or uncontrolled side effects , no missed medications, lab draws as ordered  attend all scheduled medical appointments: with Infectious Disease  as evidenced by no missed appts  through collaboration with RN Care manager, provider, and care team.   Interventions: Evaluation of current treatment plan related to  self management and patient's adherence to plan as established by provider  Transitions of Care:  Goal on track:  Yes. Notify Home Health , or VBCI CM for any changes in PICC line , s/s of infection.SOB or pain   Patient Goals/Self-Care Activities: Participate in Transition of Care Program/Attend TOC scheduled calls Take all medications as prescribed Attend all scheduled provider appointments Call pharmacy for medication refills 3-7 days in advance of running out of medications Perform IADL's (shopping, preparing meals, housekeeping, managing finances) independently Call provider office for new concerns or questions   Follow Up Plan:  Telephone follow up appointment with care management team member scheduled for:  10/05/23   2 pm as requested by patient  for lab review  Follow up with provider  re: any changes in medication dose, s/s of infection, change in Mental status, elevated Temp, or change in HR or B/P           Plan: The patient has been provided with contact information for the care management team and has been advised to call with any health related questions or concerns.   Routine follow-up and on-going assessment evaluation and education of disease processes, recommended interventions  for both chronic and acute medical conditions , will occur during each weekly visit along with ongoing review of symptoms ,medication reviews and reconciliation. Any updates , inconsistencies, discrepancies or acute care concerns will be addressed and routed to the correct Practitioner if indicated   Please refer to Care Plan for goals and interventions -Effectiveness of interventions, symptom management and outcomes will be evaluated  weekly during [facility] 30-day Program Outreach calls  . Any necessary  changes and updates to Care Plan will be completed episodically    Reviewed goals for care  Patient provided with Contact information and verbalized understanding with current POC.  Susa Loffler , BSN, RN Care Management Coordinator [place]   [facility] [email or web address] Direct Dial: [phone]

## 2023-10-01 DIAGNOSIS — G061 Intraspinal abscess and granuloma: Secondary | ICD-10-CM | POA: Diagnosis not present

## 2023-10-02 DIAGNOSIS — M0008 Staphylococcal arthritis, vertebrae: Secondary | ICD-10-CM | POA: Diagnosis not present

## 2023-10-02 DIAGNOSIS — D649 Anemia, unspecified: Secondary | ICD-10-CM | POA: Diagnosis not present

## 2023-10-02 DIAGNOSIS — Z4789 Encounter for other orthopedic aftercare: Secondary | ICD-10-CM | POA: Diagnosis not present

## 2023-10-02 DIAGNOSIS — M6089 Other myositis, multiple sites: Secondary | ICD-10-CM | POA: Diagnosis not present

## 2023-10-02 DIAGNOSIS — R634 Abnormal weight loss: Secondary | ICD-10-CM | POA: Diagnosis not present

## 2023-10-02 DIAGNOSIS — I10 Essential (primary) hypertension: Secondary | ICD-10-CM | POA: Diagnosis not present

## 2023-10-02 DIAGNOSIS — M48061 Spinal stenosis, lumbar region without neurogenic claudication: Secondary | ICD-10-CM | POA: Diagnosis not present

## 2023-10-02 DIAGNOSIS — I2510 Atherosclerotic heart disease of native coronary artery without angina pectoris: Secondary | ICD-10-CM | POA: Diagnosis not present

## 2023-10-02 DIAGNOSIS — E785 Hyperlipidemia, unspecified: Secondary | ICD-10-CM | POA: Diagnosis not present

## 2023-10-02 DIAGNOSIS — F419 Anxiety disorder, unspecified: Secondary | ICD-10-CM | POA: Diagnosis not present

## 2023-10-02 DIAGNOSIS — B9561 Methicillin susceptible Staphylococcus aureus infection as the cause of diseases classified elsewhere: Secondary | ICD-10-CM | POA: Diagnosis not present

## 2023-10-02 DIAGNOSIS — E43 Unspecified severe protein-calorie malnutrition: Secondary | ICD-10-CM | POA: Diagnosis not present

## 2023-10-02 DIAGNOSIS — Z792 Long term (current) use of antibiotics: Secondary | ICD-10-CM | POA: Diagnosis not present

## 2023-10-02 DIAGNOSIS — Z452 Encounter for adjustment and management of vascular access device: Secondary | ICD-10-CM | POA: Diagnosis not present

## 2023-10-02 DIAGNOSIS — M4726 Other spondylosis with radiculopathy, lumbar region: Secondary | ICD-10-CM | POA: Diagnosis not present

## 2023-10-02 DIAGNOSIS — M47814 Spondylosis without myelopathy or radiculopathy, thoracic region: Secondary | ICD-10-CM | POA: Diagnosis not present

## 2023-10-02 DIAGNOSIS — G061 Intraspinal abscess and granuloma: Secondary | ICD-10-CM | POA: Diagnosis not present

## 2023-10-05 ENCOUNTER — Other Ambulatory Visit: Payer: Self-pay

## 2023-10-05 ENCOUNTER — Encounter: Payer: Self-pay | Admitting: Urology

## 2023-10-05 ENCOUNTER — Ambulatory Visit: Payer: Medicare Other | Admitting: Urology

## 2023-10-05 VITALS — BP 152/74 | HR 70 | Ht 71.0 in | Wt 180.0 lb

## 2023-10-05 DIAGNOSIS — Z556 Problems related to health literacy: Secondary | ICD-10-CM | POA: Diagnosis not present

## 2023-10-05 DIAGNOSIS — E785 Hyperlipidemia, unspecified: Secondary | ICD-10-CM | POA: Diagnosis not present

## 2023-10-05 DIAGNOSIS — I10 Essential (primary) hypertension: Secondary | ICD-10-CM | POA: Diagnosis not present

## 2023-10-05 DIAGNOSIS — E43 Unspecified severe protein-calorie malnutrition: Secondary | ICD-10-CM | POA: Diagnosis not present

## 2023-10-05 DIAGNOSIS — R972 Elevated prostate specific antigen [PSA]: Secondary | ICD-10-CM | POA: Diagnosis not present

## 2023-10-05 DIAGNOSIS — Z452 Encounter for adjustment and management of vascular access device: Secondary | ICD-10-CM | POA: Diagnosis not present

## 2023-10-05 DIAGNOSIS — B9561 Methicillin susceptible Staphylococcus aureus infection as the cause of diseases classified elsewhere: Secondary | ICD-10-CM | POA: Diagnosis not present

## 2023-10-05 DIAGNOSIS — Z4789 Encounter for other orthopedic aftercare: Secondary | ICD-10-CM | POA: Diagnosis not present

## 2023-10-05 DIAGNOSIS — R634 Abnormal weight loss: Secondary | ICD-10-CM | POA: Diagnosis not present

## 2023-10-05 DIAGNOSIS — Z7982 Long term (current) use of aspirin: Secondary | ICD-10-CM | POA: Diagnosis not present

## 2023-10-05 DIAGNOSIS — D649 Anemia, unspecified: Secondary | ICD-10-CM | POA: Diagnosis not present

## 2023-10-05 DIAGNOSIS — M4726 Other spondylosis with radiculopathy, lumbar region: Secondary | ICD-10-CM | POA: Diagnosis not present

## 2023-10-05 DIAGNOSIS — M6089 Other myositis, multiple sites: Secondary | ICD-10-CM | POA: Diagnosis not present

## 2023-10-05 DIAGNOSIS — F419 Anxiety disorder, unspecified: Secondary | ICD-10-CM | POA: Diagnosis not present

## 2023-10-05 DIAGNOSIS — M0008 Staphylococcal arthritis, vertebrae: Secondary | ICD-10-CM | POA: Diagnosis not present

## 2023-10-05 DIAGNOSIS — M47814 Spondylosis without myelopathy or radiculopathy, thoracic region: Secondary | ICD-10-CM | POA: Diagnosis not present

## 2023-10-05 DIAGNOSIS — M48061 Spinal stenosis, lumbar region without neurogenic claudication: Secondary | ICD-10-CM | POA: Diagnosis not present

## 2023-10-05 DIAGNOSIS — Z792 Long term (current) use of antibiotics: Secondary | ICD-10-CM | POA: Diagnosis not present

## 2023-10-05 DIAGNOSIS — I2510 Atherosclerotic heart disease of native coronary artery without angina pectoris: Secondary | ICD-10-CM | POA: Diagnosis not present

## 2023-10-05 DIAGNOSIS — G061 Intraspinal abscess and granuloma: Secondary | ICD-10-CM | POA: Diagnosis not present

## 2023-10-05 NOTE — Progress Notes (Signed)
Assessment: 1. Elevated PSA      Plan: Today I had a long discussion with the patient regarding his elevated PSA including the issues and controversies regarding prostate cancer early detection.  Given his stability over the last several years and recent rise I have recommended short-term follow-up with repeat testing.  He will return in 2 to 3 months with a free and total PSA prior to visit.  Chief Complaint: elevated psa  History of Present Illness:  Dan Sanchez is a 67 y.o. male with a past medical history of hypertension, hyperlipidemia and recent epidural abscess status post laminectomy currently on long-term antibiotics for MSSA infection who is seen in consultation from Sheliah Hatch, MD for evaluation of elevated psa.  Patient had routine PSA testing 08/28/2023 which was 4.11 substantially above his baseline of approximately 1.5-2.0 over the last 6 years.  No bothersome LUTS No fam hx of CaP  Past Medical History:  Past Medical History:  Diagnosis Date   Allergy    CAD (coronary artery disease)    mild non-obstructive   GERD (gastroesophageal reflux disease)    Hyperlipidemia    S/P right rotator cuff repair    Sinusitis, bacterial    Urinary incontinence     Past Surgical History:  Past Surgical History:  Procedure Laterality Date   COLONOSCOPY  04/08/2009   LEFT HEART CATHETERIZATION WITH CORONARY ANGIOGRAM N/A 08/13/2014   Procedure: LEFT HEART CATHETERIZATION WITH CORONARY ANGIOGRAM;  Surgeon: Kathleene Hazel, MD;  Location: [facility] CATH LAB;  Service: Cardiovascular;  Laterality: N/A;   LUMBAR LAMINECTOMY FOR EPIDURAL ABSCESS N/A 08/30/2023   Procedure: LUMBAR LAMINECTOMY LUMBAR THREE-LUMBAR FOUR, LUMBAR FOUR-LUMBAR FIVE WITH EVACUATION OF EPIDURAL ABSCESS;  Surgeon: Jadene Pierini, MD;  Location: [facility] OR;  Service: Neurosurgery;  Laterality: N/A;   SHOULDER ARTHROSCOPY WITH ROTATOR CUFF REPAIR Right 10/26/2017   Procedure: RIGHT SHOULDER  ARTHROSCOPY WITH ROTATOR CUFF REPAIR AND DEBRIDEMENT;  Surgeon: Eugenia Mcalpine, MD;  Location: [facility];  Service: Orthopedics;  Laterality: Right;   SHOULDER SURGERY     Right shoulder    TEE WITHOUT CARDIOVERSION N/A 09/04/2023   Procedure: TRANSESOPHAGEAL ECHOCARDIOGRAM;  Surgeon: Pricilla Riffle, MD;  Location: [facility] INVASIVE CV LAB;  Service: Cardiovascular;  Laterality: N/A;    Allergies:  Allergies  Allergen Reactions   Bee Venom Swelling    Family History:  Family History  Problem Relation Age of Onset   Heart disease Father        MI age 18   Colon cancer Neg Hx    Esophageal cancer Neg Hx    Rectal cancer Neg Hx    Stomach cancer Neg Hx    Colon polyps Neg Hx    Sleep apnea Neg Hx     Social History:  Social History   Tobacco Use   Smoking status: Never   Smokeless tobacco: Never  Vaping Use   Vaping status: Never Used  Substance Use Topics   Alcohol use: Yes    Alcohol/week: 4.0 standard drinks of alcohol    Types: 4 Standard drinks or equivalent per week    Comment: occ   Drug use: No    Review of symptoms:  Constitutional:  Negative for unexplained weight loss, night sweats, fever, chills ENT:  Negative for nose bleeds, sinus pain, painful swallowing CV:  Negative for chest pain, shortness of breath, exercise intolerance, palpitations, loss of consciousness Resp:  Negative for cough, wheezing, shortness of breath GI:  Negative for nausea, vomiting, diarrhea, bloody stools GU:  Positives noted in HPI; otherwise negative for gross hematuria, dysuria, urinary incontinence Neuro:  Negative for seizures, poor balance, limb weakness, slurred speech Psych:  Negative for lack of energy, depression, anxiety Endocrine:  Negative for polydipsia, polyuria, symptoms of hypoglycemia (dizziness, hunger, sweating) Hematologic:  Negative for anemia, purpura, petechia, prolonged or excessive bleeding, use of anticoagulants  Allergic:  Negative for  difficulty breathing or choking as a result of exposure to anything; no shellfish allergy; no allergic response (rash/itch) to materials, foods  Physical exam: BP (!) 152/74   Pulse 70   Ht 5\' 11"  (1.803 m)   Wt 180 lb (81.6 kg)   BMI 25.10 kg/m  GENERAL APPEARANCE:  Well appearing, well developed, well nourished, NAD  GU:  nl ext genitalia DRE:  nst, prostate 30gm without n/i

## 2023-10-05 NOTE — Patient Outreach (Signed)
Care Management  Transitions of Care Program Transitions of Care Post-discharge week 4   10/05/2023 Name: Dan Sanchez MRN: [id number] DOB: Aug 06, 1956  Subjective: Dan Sanchez is a 67 y.o. year old male who is a primary care patient of Tabori, Helane Rima, MD. The Care Management team Engaged with patient Engaged with patient by telephone to assess and address transitions of care needs.   Consent to Services:  Patient was given information about care management services, agreed to services, and gave verbal consent to participate.   Assessment:     Patient voices no new complaints Patient has not developed/ reported any new Medical issues / Dx or acute changes.- since last follow-up call for most recent  Hospital stay    08/29/23-10/7   / 2024  He is doing very well. Is on final week of IV ABT last dose scheduled for 10/11/23 with PICC removal 10/12/23. He is then starting a 4 week course if oral ABT ( Doxycycline 100 mg. ) Next IF F/U is 10/2023. He denies any side effects from long term ABT. Per spouse his sed rate was 29. On most recent labs . He has UA pending from routine follow-up with Urology . He had no questions or concerns  Patient is aware of red flags s/s to watch for and was encouraged to report any of these identified , any new symptoms , changes in baseline or  medication regimen,  change in health status  /  well-being, or safety concerns to PCP and / or the  VBCI Case Management team .        SDOH Interventions    Flowsheet Row Telephone from 09/11/2023 in [place] POPULATION HEALTH DEPARTMENT Telephone from 09/08/2023 in [place] POPULATION HEALTH DEPARTMENT  SDOH Interventions    Food Insecurity Interventions -- Patient Declined, Intervention Not Indicated  Housing Interventions -- Intervention Not Indicated  Transportation Interventions Intervention Not Indicated --  Health Literacy Interventions Intervention Not Indicated --        Goals Addressed              This Visit's Progress    TOC Care Plan       Current Barriers:  New PICC line and requiring IV ABT x 45 days- next appt with Infectious disease 09/27/23- completed BC negative  Recent labs completed Next ID f/p 12/24   RNCM Clinical Goal(s):  Patient will work with the Care Management team over the next 30 days to address Transition of Care Barriers: Medication Management Provider appointments Home Health services take all medications exactly as prescribed and will call provider for medication related questions as evidenced by no adverse or uncontrolled side effects , no missed medications, lab draws as ordered  attend all scheduled medical appointments: with Infectious Disease  as evidenced by no missed appts  through collaboration with RN Care manager, provider, and care team.   Interventions: Evaluation of current treatment plan related to  self management and patient's adherence to plan as established by provider  Transitions of Care:  Goal on track:  Yes. Notify Home Health , or VBCI CM for any changes in PICC line , s/s of infection.SOB or pain-last dose IV ABT 11/13 PICC Lne removal 11/14, converts to ABT x 4 weeks    Patient Goals/Self-Care Activities: Participate in Transition of Care Program/Attend TOC scheduled calls Take all medications as prescribed Attend all scheduled provider appointments Call pharmacy for medication refills 3-7 days in advance of running out of medications  Perform IADL's (shopping, preparing meals, housekeeping, managing finances) independently Call provider office for new concerns or questions   Follow Up Plan:  Telephone follow up appointment with care management team member scheduled for:  10/17/23   10:00am as requested by patient  for lab review  Follow up with provider re: any changes in medication dose, s/s of infection, change in Mental status, elevated Temp, or change in HR or B/P           Plan: The patient has been  provided with contact information for the care management team and has been advised to call with any health related questions or concerns.  Routine follow-up and on-going assessment evaluation and education of disease processes, recommended interventions for both chronic and acute medical conditions , will occur during each weekly visit along with ongoing review of symptoms ,medication reviews and reconciliation. Any updates , inconsistencies, discrepancies or acute care concerns will be addressed and routed to the correct Practitioner if indicated   Please refer to Care Plan for goals and interventions -Effectiveness of interventions, symptom management and outcomes will be evaluated  weekly during [facility] 30-day Program Outreach calls  . Any necessary  changes and updates to Care Plan will be completed episodically    Reviewed goals for care  Patient provided with Contact information and verbalized understanding with current POC.  He requested a  f/u and final TOC  call 10/17/23 10 am.    Susa Loffler , BSN, RN Care Management Coordinator [facility]   [facility] [email or web address] Direct Dial: [phone]

## 2023-10-07 DIAGNOSIS — G061 Intraspinal abscess and granuloma: Secondary | ICD-10-CM | POA: Diagnosis not present

## 2023-10-09 DIAGNOSIS — E785 Hyperlipidemia, unspecified: Secondary | ICD-10-CM | POA: Diagnosis not present

## 2023-10-09 DIAGNOSIS — D649 Anemia, unspecified: Secondary | ICD-10-CM | POA: Diagnosis not present

## 2023-10-09 DIAGNOSIS — Z792 Long term (current) use of antibiotics: Secondary | ICD-10-CM | POA: Diagnosis not present

## 2023-10-09 DIAGNOSIS — I10 Essential (primary) hypertension: Secondary | ICD-10-CM | POA: Diagnosis not present

## 2023-10-09 DIAGNOSIS — Z4789 Encounter for other orthopedic aftercare: Secondary | ICD-10-CM | POA: Diagnosis not present

## 2023-10-09 DIAGNOSIS — M4726 Other spondylosis with radiculopathy, lumbar region: Secondary | ICD-10-CM | POA: Diagnosis not present

## 2023-10-09 DIAGNOSIS — M6089 Other myositis, multiple sites: Secondary | ICD-10-CM | POA: Diagnosis not present

## 2023-10-09 DIAGNOSIS — Z452 Encounter for adjustment and management of vascular access device: Secondary | ICD-10-CM | POA: Diagnosis not present

## 2023-10-09 DIAGNOSIS — M48061 Spinal stenosis, lumbar region without neurogenic claudication: Secondary | ICD-10-CM | POA: Diagnosis not present

## 2023-10-09 DIAGNOSIS — R634 Abnormal weight loss: Secondary | ICD-10-CM | POA: Diagnosis not present

## 2023-10-09 DIAGNOSIS — B9561 Methicillin susceptible Staphylococcus aureus infection as the cause of diseases classified elsewhere: Secondary | ICD-10-CM | POA: Diagnosis not present

## 2023-10-09 DIAGNOSIS — E43 Unspecified severe protein-calorie malnutrition: Secondary | ICD-10-CM | POA: Diagnosis not present

## 2023-10-09 DIAGNOSIS — M0008 Staphylococcal arthritis, vertebrae: Secondary | ICD-10-CM | POA: Diagnosis not present

## 2023-10-09 DIAGNOSIS — F419 Anxiety disorder, unspecified: Secondary | ICD-10-CM | POA: Diagnosis not present

## 2023-10-09 DIAGNOSIS — I2510 Atherosclerotic heart disease of native coronary artery without angina pectoris: Secondary | ICD-10-CM | POA: Diagnosis not present

## 2023-10-09 DIAGNOSIS — M47814 Spondylosis without myelopathy or radiculopathy, thoracic region: Secondary | ICD-10-CM | POA: Diagnosis not present

## 2023-10-09 DIAGNOSIS — G061 Intraspinal abscess and granuloma: Secondary | ICD-10-CM | POA: Diagnosis not present

## 2023-10-09 LAB — MICROSCOPIC EXAMINATION

## 2023-10-09 LAB — URINALYSIS, ROUTINE W REFLEX MICROSCOPIC
Bilirubin, UA: NEGATIVE
Glucose, UA: NEGATIVE
Ketones, UA: NEGATIVE
Leukocytes,UA: NEGATIVE
Nitrite, UA: NEGATIVE
RBC, UA: NEGATIVE
Specific Gravity, UA: 1.02 (ref 1.005–1.030)
Urobilinogen, Ur: 0.2 mg/dL (ref 0.2–1.0)
pH, UA: 7 (ref 5.0–7.5)

## 2023-10-10 ENCOUNTER — Telehealth: Payer: Self-pay | Admitting: Urology

## 2023-10-10 NOTE — Telephone Encounter (Signed)
Pt called and LVM stating urine results were posted. He would like someone to call hima nd go over the results with him.

## 2023-10-11 ENCOUNTER — Telehealth: Payer: Self-pay | Admitting: Urology

## 2023-10-11 ENCOUNTER — Telehealth: Payer: Self-pay | Admitting: Family Medicine

## 2023-10-11 NOTE — Telephone Encounter (Signed)
 Forms completed and returned to [place]

## 2023-10-11 NOTE — Telephone Encounter (Signed)
Faxed

## 2023-10-11 NOTE — Telephone Encounter (Signed)
Forms signed and returned to [place]

## 2023-10-11 NOTE — Telephone Encounter (Signed)
Returning call regarding Urine results.

## 2023-10-11 NOTE — Telephone Encounter (Signed)
Placed in your sign folder  

## 2023-10-11 NOTE — Telephone Encounter (Signed)
Home Health Order  Is this a Order  [facility] Agency:[facility]  Has charge sheet been attached? Yes    Where has form been placed: [employer]

## 2023-10-11 NOTE — Telephone Encounter (Signed)
Home Health Certification or Plan of Care Tracking  Is this a Order?  HH Agency:  Order Number:    Has charge sheet been attached? Yes  Where has form been placed:   [employer]

## 2023-10-11 NOTE — Telephone Encounter (Signed)
Placed in folder at nurse station

## 2023-10-12 DIAGNOSIS — M48061 Spinal stenosis, lumbar region without neurogenic claudication: Secondary | ICD-10-CM | POA: Diagnosis not present

## 2023-10-12 DIAGNOSIS — D649 Anemia, unspecified: Secondary | ICD-10-CM | POA: Diagnosis not present

## 2023-10-12 DIAGNOSIS — B9561 Methicillin susceptible Staphylococcus aureus infection as the cause of diseases classified elsewhere: Secondary | ICD-10-CM | POA: Diagnosis not present

## 2023-10-12 DIAGNOSIS — F419 Anxiety disorder, unspecified: Secondary | ICD-10-CM | POA: Diagnosis not present

## 2023-10-12 DIAGNOSIS — M47814 Spondylosis without myelopathy or radiculopathy, thoracic region: Secondary | ICD-10-CM | POA: Diagnosis not present

## 2023-10-12 DIAGNOSIS — Z4789 Encounter for other orthopedic aftercare: Secondary | ICD-10-CM | POA: Diagnosis not present

## 2023-10-12 DIAGNOSIS — G061 Intraspinal abscess and granuloma: Secondary | ICD-10-CM | POA: Diagnosis not present

## 2023-10-12 DIAGNOSIS — I2510 Atherosclerotic heart disease of native coronary artery without angina pectoris: Secondary | ICD-10-CM | POA: Diagnosis not present

## 2023-10-12 DIAGNOSIS — R634 Abnormal weight loss: Secondary | ICD-10-CM | POA: Diagnosis not present

## 2023-10-12 DIAGNOSIS — M0008 Staphylococcal arthritis, vertebrae: Secondary | ICD-10-CM | POA: Diagnosis not present

## 2023-10-12 DIAGNOSIS — Z452 Encounter for adjustment and management of vascular access device: Secondary | ICD-10-CM | POA: Diagnosis not present

## 2023-10-12 DIAGNOSIS — M4726 Other spondylosis with radiculopathy, lumbar region: Secondary | ICD-10-CM | POA: Diagnosis not present

## 2023-10-12 DIAGNOSIS — E785 Hyperlipidemia, unspecified: Secondary | ICD-10-CM | POA: Diagnosis not present

## 2023-10-12 DIAGNOSIS — E43 Unspecified severe protein-calorie malnutrition: Secondary | ICD-10-CM | POA: Diagnosis not present

## 2023-10-12 DIAGNOSIS — I10 Essential (primary) hypertension: Secondary | ICD-10-CM | POA: Diagnosis not present

## 2023-10-12 DIAGNOSIS — M6089 Other myositis, multiple sites: Secondary | ICD-10-CM | POA: Diagnosis not present

## 2023-10-13 ENCOUNTER — Other Ambulatory Visit (HOSPITAL_BASED_OUTPATIENT_CLINIC_OR_DEPARTMENT_OTHER): Payer: Self-pay

## 2023-10-16 ENCOUNTER — Other Ambulatory Visit: Payer: Medicare Other

## 2023-10-16 DIAGNOSIS — R972 Elevated prostate specific antigen [PSA]: Secondary | ICD-10-CM

## 2023-10-17 ENCOUNTER — Other Ambulatory Visit: Payer: Self-pay

## 2023-10-17 LAB — URINALYSIS, ROUTINE W REFLEX MICROSCOPIC
Bilirubin, UA: NEGATIVE
Glucose, UA: NEGATIVE
Ketones, UA: NEGATIVE
Leukocytes,UA: NEGATIVE
Nitrite, UA: NEGATIVE
Protein,UA: NEGATIVE
RBC, UA: NEGATIVE
Specific Gravity, UA: <1.005 — ABNORMAL LOW (ref 1.005–1.030)
Urobilinogen, Ur: 0.2 mg/dL (ref 0.2–1.0)
pH, UA: 6.5 (ref 5.0–7.5)

## 2023-10-18 ENCOUNTER — Telehealth: Payer: Self-pay

## 2023-10-18 NOTE — Patient Outreach (Signed)
  Care Management  Transitions of Care Program Transitions of Care Post-discharge Program Completion  10/18/2023 Name: Dan Sanchez MRN: [id number] DOB: 06-Mar-1956  Subjective: Dan Sanchez is a 67 y.o. year old male who is a primary care patient of Sheliah Hatch, MD. The Care Management team was unable to reach the patient by phone to assess and address transitions of care needs.   Plan: No further outreach attempts will be made at this time.  We have been unable to reach the patient.  The patient has successfully completed the 30-day TOC Program. Condition is stable No further acute needs identified at this time. Chronic conditions and ongoing care is  managed thru collaboration with  PCP,  Specialists and additional Healthcare Providers if indicated . Patient has previously verbalized understanding of ongoing plan of care.  SDOH needs have been screened  during the [facility] Program calls and interventions provided if identified.    Previously discussed current home medications  rationale of use, how/when to take medications. Patient is aware of potential side effects, and was encouraged to notify PCP for any changes in condition or signs / symptoms not relieved  with interventions.   Patient will call 911 for Medical Emergencies or Life -Threatening or report to a local emergency department or urgent care.   Patient was encouraged to Contact PCP  with any questions or concerns regarding ongoing  medical care, any  difficulty obtaining or picking up  prescriptions, any  changes or  worsening in  condition including signs / symptoms not relieved  with interventions  Susa Loffler , BSN, RN Care Management Coordinator    [facility] [email or web address] Direct Dial: [phone]

## 2023-10-19 ENCOUNTER — Other Ambulatory Visit (HOSPITAL_BASED_OUTPATIENT_CLINIC_OR_DEPARTMENT_OTHER): Payer: Self-pay

## 2023-10-19 ENCOUNTER — Other Ambulatory Visit: Payer: Self-pay | Admitting: Family Medicine

## 2023-10-19 ENCOUNTER — Telehealth: Payer: Self-pay

## 2023-10-19 ENCOUNTER — Telehealth: Payer: Self-pay | Admitting: Urology

## 2023-10-19 ENCOUNTER — Telehealth: Payer: Self-pay | Admitting: Family Medicine

## 2023-10-19 MED ORDER — OMEPRAZOLE 20 MG PO CPDR
20.0000 mg | DELAYED_RELEASE_CAPSULE | Freq: Every day | ORAL | 1 refills | Status: DC
  Filled 2023-10-19: qty 90, 90d supply, fill #0
  Filled 2024-01-22: qty 90, 90d supply, fill #1

## 2023-10-19 NOTE — Telephone Encounter (Signed)
Placed in folder at nurse station

## 2023-10-19 NOTE — Telephone Encounter (Signed)
Spoke with patient regarding the UA results. He verbalized understanding.

## 2023-10-19 NOTE — Telephone Encounter (Signed)
Home Health Discharge Summary  Is this a Discharge Summary?  [facility] Agency:[facility]   Has charge sheet been attached? Yes  Where has form been placed:  [employer]

## 2023-10-19 NOTE — Telephone Encounter (Signed)
Forms reviewed, no action needed

## 2023-10-19 NOTE — Telephone Encounter (Signed)
Pt left msg with after hours regarding results.   Left msg for pt to return call to discuss results.

## 2023-10-19 NOTE — Telephone Encounter (Signed)
Noted  

## 2023-10-19 NOTE — Telephone Encounter (Signed)
Returning your call. °

## 2023-11-10 ENCOUNTER — Encounter: Payer: Self-pay | Admitting: Physical Therapy

## 2023-11-10 ENCOUNTER — Ambulatory Visit: Payer: Medicare Other | Attending: Neurological Surgery | Admitting: Physical Therapy

## 2023-11-10 DIAGNOSIS — M5459 Other low back pain: Secondary | ICD-10-CM | POA: Insufficient documentation

## 2023-11-10 DIAGNOSIS — M5416 Radiculopathy, lumbar region: Secondary | ICD-10-CM | POA: Insufficient documentation

## 2023-11-10 DIAGNOSIS — M6281 Muscle weakness (generalized): Secondary | ICD-10-CM | POA: Insufficient documentation

## 2023-11-10 NOTE — Addendum Note (Signed)
Addended by: Jearld Lesch on: 11/10/2023 09:25 AM   Modules accepted: Orders

## 2023-11-10 NOTE — Therapy (Signed)
OUTPATIENT PHYSICAL THERAPY THORACOLUMBAR EVALUATION   Patient Name: Dan Sanchez MRN: [id number] DOB:20-Apr-1956, 67 y.o., male Today's Date: 11/10/2023  END OF SESSION:  PT End of Session - 11/10/23 0830     Visit Number 1    Date for PT Re-Evaluation 01/11/24    Authorization Type [employer]    PT Start Time 0830    PT Stop Time 0917    PT Time Calculation (min) 47 min    Activity Tolerance Patient tolerated treatment well    Behavior During Therapy [facility] for tasks assessed/performed              Past Medical History:  Diagnosis Date   Allergy    CAD (coronary artery disease)    mild non-obstructive   GERD (gastroesophageal reflux disease)    Hyperlipidemia    S/P right rotator cuff repair    Sinusitis, bacterial    Urinary incontinence    Past Surgical History:  Procedure Laterality Date   COLONOSCOPY  04/08/2009   LEFT HEART CATHETERIZATION WITH CORONARY ANGIOGRAM N/A 08/13/2014   Procedure: LEFT HEART CATHETERIZATION WITH CORONARY ANGIOGRAM;  Surgeon: Kathleene Hazel, MD;  Location: [facility] CATH LAB;  Service: Cardiovascular;  Laterality: N/A;   LUMBAR LAMINECTOMY FOR EPIDURAL ABSCESS N/A 08/30/2023   Procedure: LUMBAR LAMINECTOMY LUMBAR THREE-LUMBAR FOUR, LUMBAR FOUR-LUMBAR FIVE WITH EVACUATION OF EPIDURAL ABSCESS;  Surgeon: Jadene Pierini, MD;  Location: [facility] OR;  Service: Neurosurgery;  Laterality: N/A;   SHOULDER ARTHROSCOPY WITH ROTATOR CUFF REPAIR Right 10/26/2017   Procedure: RIGHT SHOULDER ARTHROSCOPY WITH ROTATOR CUFF REPAIR AND DEBRIDEMENT;  Surgeon: Eugenia Mcalpine, MD;  Location: [facility];  Service: Orthopedics;  Laterality: Right;   SHOULDER SURGERY     Right shoulder    TEE WITHOUT CARDIOVERSION N/A 09/04/2023   Procedure: TRANSESOPHAGEAL ECHOCARDIOGRAM;  Surgeon: Pricilla Riffle, MD;  Location: [facility] INVASIVE CV LAB;  Service: Cardiovascular;  Laterality: N/A;   Patient Active Problem List   Diagnosis Date Noted   MSSA bacteremia  08/31/2023   Protein-calorie malnutrition, severe 08/30/2023   Abscess in epidural space of lumbar spine 08/29/2023   Septic arthritis of lumbar spine (HCC) 08/29/2023   Myositis of multiple muscles 08/29/2023   Normocytic anemia 08/29/2023   Elevated LFTs 08/29/2023   Osteoarthritis of knee 03/01/2023   Impingement syndrome of right shoulder region 02/27/2023   Coronary artery disease involving native coronary artery of native heart without angina pectoris 12/15/2022   Spinal stenosis of lumbar region with neurogenic claudication 03/23/2021   Lumbar radiculopathy 02/04/2021   HTN (hypertension) 03/29/2019   Overweight (BMI 25.0-29.9) 03/29/2019   Nasal septal deviation 02/01/2019   Nasal crusting 02/01/2019   Nasal turbinate hypertrophy 02/01/2019   S/P right rotator cuff repair 10/26/2017   Contact dermatitis 08/27/2015   HSV-1 (herpes simplex virus 1) infection 08/27/2015   Bee sting allergy 09/10/2014   Chest pain 08/11/2014   Allergic rhinitis 08/10/2014   Allergic conjunctivitis 02/20/2014   Insomnia 02/03/2014   GERD (gastroesophageal reflux disease) 02/03/2014   General medical examination 03/16/2012   Hyperlipidemia 12/24/2007   ERECTILE DYSFUNCTION 12/24/2007    PCP: Beverely Low, MD  REFERRING PROVIDER: Shon Baton, MD  REFERRING DIAG: lumbar radiculopathy  Rationale for Evaluation and Treatment: Rehabilitation  THERAPY DIAG:  Other low back pain  Muscle weakness (generalized)  ONSET DATE: 08/30/23  SUBJECTIVE:  SUBJECTIVE STATEMENT: Patient was seen here for evaluation of back pain in September, he was having significant pain and pain down the legs and at times inability to walk.  He was later found to have septic arthritis in the lumbar spine, he underwent a lumbar laminectomy L2-5  08/30/23 with debridement, he has been on IV antibiotics.  He is currently on another 30 days of antibiotics.  He reports that being as sick as he was he feels he has lost a lot of strength and is very stiff in the low back   PERTINENT HISTORY:  Lumbar laminectomy L3-5 08/30/23  PAIN:  Are you having pain? Yes: NPRS scale: 0/10 Pain location: just feel really stiff and a little sore Pain description: stiff and sore Aggravating factors: just get sore with activity Relieving factors: stretching,  PRECAUTIONS: None  RED FLAGS: None   WEIGHT BEARING RESTRICTIONS: No  FALLS:  Has patient fallen in last 6 months? Yes, when legs were giving out  LIVING ENVIRONMENT: Lives with: lives with their family and lives with their spouse Lives in: House/apartment Stairs: Yes: Internal: 12 steps; bilateral but cannot reach both Has following equipment at home: None  OCCUPATION: sitting in sales, some driving  PLOF: Independent and yardwork, some pickleball 1 every month, golf every week  PATIENT GOALS: have less pain, move better, get my stamina and strength back  NEXT MD VISIT: none scheduled  OBJECTIVE:   DIAGNOSTIC FINDINGS:  Older X-ray with significant stenosis  COGNITION: Overall cognitive status: Within functional limits for tasks assessed     SENSATION: WFL  MUSCLE LENGTH: Very tight HS 55 degree SLR, right leg feels "more than left" Very tight ITB and piriformis mms + Thomas test bilateral, very tight quads  POSTURE: rounded shoulders, forward head, and decreased lumbar lordosis, slouched sitting posture  PALPATION: Tight with mild tenderness , scar is well healed  LUMBAR ROM:   AROM eval  Flexion Decreased 75%   Extension Decreased 75%   Right lateral flexion Decreased 75%  Left lateral flexion Decreased 75%  Right rotation   Left rotation    (Blank rows = not tested)  LOWER EXTREMITY ROM:     Stiff hips and HS  LOWER EXTREMITY MMT:   Right hip 4-/5,  left hip 4+, knees 5-, ankles 4+  LUMBAR SPECIAL TESTS:  Straight leg raise test: Positive and Slump test: Negative    FUNCTIONAL TESTS:  Timed up and go (TUG): 10 seconds Patient does have right shoulder pain, I did a quick assessment of this, he has very weak RC bilaterally, ER was 3+/5.  He has right shoulder ROM that is limited with ER, flexion and abduction, negative impingement test, + empty can and + Yergason's test.  Again very poor posture may influence the strain on the right biceps GAIT: Distance walked: 100 feet Assistive device utilized: None Level of assistance: Complete Independence Comments: forward flexed posture  TODAY'S TREATMENT:  DATE:     PATIENT EDUCATION:  Education details: HEP/POC Person educated: Patient Education method: Programmer, multimedia, Demonstration, Actor cues, and Verbal cues Education comprehension: verbalized understanding  HOME EXERCISE PROGRAM: Passive stretch  for HS, pirformis  ASSESSMENT:  CLINICAL IMPRESSION: Patient is a 67  y.o. male who was seen today for physical therapy evaluation and treatment for s/p lumbar laminectomy with septic arthritis on 08/30/23 past MRI's show significant stenosis.  He has been on antibiotics and overall just reports feeling stiff and weakness and unable to do what he was doing prior, like golf, pickle ball, yard work  He is very tight in the HS, very limited in his lumbar ROM, very poor posture.  OBJECTIVE IMPAIRMENTS: Abnormal gait, cardiopulmonary status limiting activity, decreased activity tolerance, decreased balance, decreased endurance, decreased mobility, difficulty walking, decreased ROM, decreased strength, increased muscle spasms, impaired flexibility, improper body mechanics, postural dysfunction, and pain.   REHAB POTENTIAL: Good  CLINICAL DECISION MAKING:  Stable/uncomplicated  EVALUATION COMPLEXITY: Low   GOALS: Goals reviewed with patient? Yes  SHORT TERM GOALS: Target date: 11/28/23  Independent with initial HEP Goal status: INITIAL  LONG TERM GOALS: Target date: 01/11/24  Independent with advanced HEP Goal status: INITIAL  2.  Decrease pain 50% Goal status: INITIAL  3.  Increase lumbar ROM 25% Goal status: INITIAL  4.  Increase SLR to 70 degrees Goal status: INITIAL  5.  Be able to play a round of golf without difficulty Goal status: INITIAL  PLAN:  PT FREQUENCY: 2x/week  PT DURATION: 12 weeks  PLANNED INTERVENTIONS: Therapeutic exercises, Therapeutic activity, Neuromuscular re-education, Balance training, Gait training, Patient/Family education, Self Care, Joint mobilization, Joint manipulation, Dry Needling, Electrical stimulation, Spinal manipulation, Spinal mobilization, Cryotherapy, Moist heat, Traction, Ultrasound, and Manual therapy.  PLAN FOR NEXT SESSION: start core and flexibility exercises and really focus on posture   Eulogio Requena W, PT 11/10/2023, 8:31 AM

## 2023-11-13 ENCOUNTER — Other Ambulatory Visit: Payer: Self-pay

## 2023-11-13 ENCOUNTER — Encounter: Payer: Self-pay | Admitting: Internal Medicine

## 2023-11-13 ENCOUNTER — Ambulatory Visit: Payer: Medicare Other | Admitting: Internal Medicine

## 2023-11-13 VITALS — BP 137/79 | HR 83 | Temp 98.0°F | Wt 190.0 lb

## 2023-11-13 DIAGNOSIS — Z8619 Personal history of other infectious and parasitic diseases: Secondary | ICD-10-CM | POA: Diagnosis not present

## 2023-11-13 DIAGNOSIS — M4646 Discitis, unspecified, lumbar region: Secondary | ICD-10-CM

## 2023-11-13 DIAGNOSIS — Z79899 Other long term (current) drug therapy: Secondary | ICD-10-CM | POA: Diagnosis not present

## 2023-11-13 DIAGNOSIS — G062 Extradural and subdural abscess, unspecified: Secondary | ICD-10-CM | POA: Diagnosis not present

## 2023-11-13 DIAGNOSIS — R7881 Bacteremia: Secondary | ICD-10-CM | POA: Diagnosis not present

## 2023-11-13 DIAGNOSIS — B9561 Methicillin susceptible Staphylococcus aureus infection as the cause of diseases classified elsewhere: Secondary | ICD-10-CM | POA: Diagnosis not present

## 2023-11-13 NOTE — Progress Notes (Signed)
Patient ID: Dan Sanchez, male   DOB: 29-Sep-1956, 67 y.o.   MRN: [id number]  HPI Dan Sanchez is a 67 y.o. male with  MSSA epidural abscess s/p L3-L5 open laminectomy and evacuation of epidural abscess on 08/30/23 and secondary bacteremia . Has been on abtx through 11/18 but had picc line replaced this past Sunday. TEE is negative.now has been on doxy for the last 4 wks.   Sed rate of 38  (down from 130 on admit) and crp of 5 on 10/21  -- at last visit in mid November    Outpatient Encounter Medications as of 11/13/2023  Medication Sig   acetaminophen (TYLENOL) 325 MG tablet Take 2 tablets (650 mg total) by mouth every 8 (eight) hours as needed for mild pain.   amLODipine (NORVASC) 10 MG tablet Take 1 tablet (10 mg total) by mouth daily.   ascorbic acid (VITAMIN C) 1000 MG tablet Take by mouth.   aspirin EC 81 MG tablet Take 1 tablet (81 mg total) by mouth daily. Swallow whole.   celecoxib (CELEBREX) 200 MG capsule Take 1 capsule (200 mg total) by mouth 2 (two) times daily.   doxycycline (VIBRA-TABS) 100 MG tablet Take 1 tablet (100 mg total) by mouth 2 (two) times daily. Take on full stomach. Start on 11/14   fexofenadine (ALLEGRA) 180 MG tablet Take 180 mg by mouth daily.   fluticasone (FLONASE) 50 MCG/ACT nasal spray Place 2 sprays into both nostrils daily.   Omega-3 Fatty Acids (FISH OIL) 1200 MG CAPS Take 2,400 mg by mouth 2 (two) times daily.   omeprazole (PRILOSEC) 20 MG capsule Take 1 capsule (20 mg total) by mouth daily.   rosuvastatin (CRESTOR) 40 MG tablet Take 1 tablet (40 mg total) by mouth daily.   rosuvastatin (CRESTOR) 40 MG tablet Take 1 tablet (40 mg total) by mouth daily.   No facility-administered encounter medications on file as of 11/13/2023.     Patient Active Problem List   Diagnosis Date Noted   MSSA bacteremia 08/31/2023   Protein-calorie malnutrition, severe 08/30/2023   Abscess in epidural space of lumbar spine 08/29/2023   Septic arthritis of  lumbar spine (HCC) 08/29/2023   Myositis of multiple muscles 08/29/2023   Normocytic anemia 08/29/2023   Elevated LFTs 08/29/2023   Osteoarthritis of knee 03/01/2023   Impingement syndrome of right shoulder region 02/27/2023   Coronary artery disease involving native coronary artery of native heart without angina pectoris 12/15/2022   Spinal stenosis of lumbar region with neurogenic claudication 03/23/2021   Lumbar radiculopathy 02/04/2021   HTN (hypertension) 03/29/2019   Overweight (BMI 25.0-29.9) 03/29/2019   Nasal septal deviation 02/01/2019   Nasal crusting 02/01/2019   Nasal turbinate hypertrophy 02/01/2019   S/P right rotator cuff repair 10/26/2017   Contact dermatitis 08/27/2015   HSV-1 (herpes simplex virus 1) infection 08/27/2015   Bee sting allergy 09/10/2014   Chest pain 08/11/2014   Allergic rhinitis 08/10/2014   Allergic conjunctivitis 02/20/2014   Insomnia 02/03/2014   GERD (gastroesophageal reflux disease) 02/03/2014   General medical examination 03/16/2012   Hyperlipidemia 12/24/2007   ERECTILE DYSFUNCTION 12/24/2007     Health Maintenance Due  Topic Date Due   Medicare Annual Wellness (AWV)  Never done   INFLUENZA VACCINE  06/29/2023     Review of Systems Still some back soreness Physical Exam   BP 137/79   Pulse 83   Temp 98 F (36.7 C) (Oral)   Wt 190 lb (86.2 kg)  SpO2 99%   BMI 26.50 kg/m   Physical Exam  Constitutional: He is oriented to person, place, and time. He appears well-developed and well-nourished. No distress.  HENT:  Mouth/Throat: Oropharynx is clear and moist. No oropharyngeal exudate.  Cardiovascular: Normal rate, regular rhythm and normal heart sounds. Exam reveals no gallop and no friction rub.  No murmur heard.  Pulmonary/Chest: Effort normal and breath sounds normal. No respiratory distress. He has no wheezes.  Abdominal: Soft. Bowel sounds are normal. He exhibits no distension. There is no tenderness.  Lymphadenopathy:   He has no cervical adenopathy.  Neurological: He is alert and oriented to person, place, and time.  Skin: Skin is warm and dry. No rash noted. No erythema.  Psychiatric: He has a normal mood and affect. His behavior is normal.    CBC Lab Results  Component Value Date   WBC 10.5 09/04/2023   RBC 3.43 (L) 09/04/2023   HGB 9.8 (L) 09/04/2023   HCT 30.2 (L) 09/04/2023   PLT 407 (H) 09/04/2023   MCV 88.0 09/04/2023   MCH 28.6 09/04/2023   MCHC 32.5 09/04/2023   RDW 12.6 09/04/2023   LYMPHSABS 1.0 08/29/2023   MONOABS 1.0 08/29/2023   EOSABS 0.0 08/29/2023    BMET Lab Results  Component Value Date   NA 136 09/04/2023   K 3.6 09/04/2023   CL 101 09/04/2023   CO2 26 09/04/2023   GLUCOSE 127 (H) 09/04/2023   BUN 9 09/04/2023   CREATININE 0.77 09/04/2023   CALCIUM 8.2 (L) 09/04/2023   GFRNONAA >60 09/04/2023    Lab Results  Component Value Date   ESRSEDRATE 6 11/13/2023   Lab Results  Component Value Date   CRP <3.0 11/13/2023     Assessment and Plan  Lumbar epidural abscess//discitis = Sees dr Jake Samples now since ostergard is gone. Plan on continuining doxy Continue with physical therapy to help with back pain.  Long term medication management =Will check sed rate and crp. If still up, need to extend abtx again for another 30-60 days Gave info on doxy, precautions   Addendum = inflammatory markers are normal. Thus will have him stop doxy at end of the month. See back in 4 wk off of abtx

## 2023-11-14 DIAGNOSIS — G062 Extradural and subdural abscess, unspecified: Secondary | ICD-10-CM | POA: Insufficient documentation

## 2023-11-14 LAB — C-REACTIVE PROTEIN: CRP: <3 mg/L (ref <8.0)

## 2023-11-14 LAB — SEDIMENTATION RATE: Sed Rate: 6 mm/h (ref 0–20)

## 2023-11-15 ENCOUNTER — Encounter: Payer: Self-pay | Admitting: Family Medicine

## 2023-11-15 ENCOUNTER — Ambulatory Visit: Payer: Medicare Other | Admitting: Family Medicine

## 2023-11-15 VITALS — BP 110/64 | HR 71 | Temp 98.1°F | Ht 70.0 in | Wt 189.0 lb

## 2023-11-15 DIAGNOSIS — Z0000 Encounter for general adult medical examination without abnormal findings: Secondary | ICD-10-CM

## 2023-11-15 DIAGNOSIS — Z125 Encounter for screening for malignant neoplasm of prostate: Secondary | ICD-10-CM | POA: Diagnosis not present

## 2023-11-15 DIAGNOSIS — I10 Essential (primary) hypertension: Secondary | ICD-10-CM

## 2023-11-15 DIAGNOSIS — E7800 Pure hypercholesterolemia, unspecified: Secondary | ICD-10-CM

## 2023-11-15 LAB — PSA, MEDICARE: PSA: 2.08 ng/mL (ref 0.10–4.00)

## 2023-11-15 NOTE — Patient Instructions (Signed)
Follow up in 6 months to recheck BP and cholesterol We'll notify you of your lab results and make any changes if needed Keep up the good work on healthy diet and regular exercise- you're doing great! Once you are off the antibiotics, we can do your flu shot and we can do your pneumonia vaccine at your next visit Call with any questions or concerns Stay Safe!  Stay Healthy! Happy Holidays!!

## 2023-11-15 NOTE — Progress Notes (Signed)
Subjective:    Patient ID: Dan Sanchez, male    DOB: 1956/06/10, 66 y.o.   MRN: [id number]  HPI Here today for Welcome to Medicare CPE.  Risk Factors: HTN- chronic problem, on Amlodipine 10mg  daily Hyperlipidemia- chronic problem, on Crestor 40mg  daily  Physical Activity: Fall Risk: improving now that epidural abscess treated Depression:  denies current sxs. Hearing: normal to conversational tones and whispered voice at 6 ft ADL's: independent Cognitive: normal linear thought process, memory and attention intact Home Safety: safe at home, lives w/ wife Height, Weight, BMI, Visual Acuity: see vitals, vision corrected to 20/20 w/ glasses Counseling: UTD on colonoscopy, Tdap.  Due for PNA and flu but pt is still on abx for epidural abscess Health Care POA/Living Will: has both in place Labs Ordered: See A&P Care Plan: See A&P   Addiction screening- no controlled substances, no hx of illicit drug use, social alcohol  Patient Care Team    Relationship Specialty Notifications Start End  Sheliah Hatch, MD PCP - General Family Medicine  09/18/23   Kathleene Hazel, MD PCP - Cardiology Cardiology Admissions 07/16/18   Beatrice Lecher, PA-C Physician Assistant Physician Assistant  04/06/12   Kathleene Hazel, MD Consulting Physician Cardiology  07/15/15      Health Maintenance  Topic Date Due   Medicare Annual Wellness (AWV)  Never done   Pneumonia Vaccine 45+ Years old (1 of 2 - PCV) Never done   Zoster Vaccines- Shingrix (1 of 2) Never done   INFLUENZA VACCINE  06/29/2023   COVID-19 Vaccine (4 - 2024-25 season) 07/30/2023   Colonoscopy  05/26/2029   DTaP/Tdap/Td (4 - Td or Tdap) 07/25/2032   Hepatitis C Screening  Completed   HIV Screening  Completed   HPV VACCINES  Aged Out      Review of Systems Patient reports no vision/hearing changes, anorexia, fever ,adenopathy, persistant/recurrent hoarseness, swallowing issues, chest pain, palpitations, edema,  persistant/recurrent cough, hemoptysis, dyspnea (rest,exertional, paroxysmal nocturnal), gastrointestinal  bleeding (melena, rectal bleeding), abdominal pain, excessive heart burn, GU symptoms (dysuria, hematuria, voiding/incontinence issues) syncope, focal weakness, memory loss, numbness & tingling, skin/hair/nail changes, depression, anxiety, abnormal bruising/bleeding, musculoskeletal symptoms/signs.     Objective:   Physical Exam General Appearance:    Alert, cooperative, no distress, appears stated age  Head:    Normocephalic, without obvious abnormality, atraumatic  Eyes:    PERRL, conjunctiva/corneas clear, EOM's intact both eyes       Ears:    Normal TM's and external ear canals, both ears  Nose:   Nares normal, septum midline, mucosa normal, no drainage   or sinus tenderness  Throat:   Lips, mucosa, and tongue normal; teeth and gums normal  Neck:   Supple, symmetrical, trachea midline, no adenopathy;       thyroid:  No enlargement/tenderness/nodules  Back:     Symmetric, no curvature, ROM normal, no CVA tenderness  Lungs:     Clear to auscultation bilaterally, respirations unlabored  Chest wall:    No tenderness or deformity  Heart:    Regular rate and rhythm, S1 and S2 normal, no murmur, rub   or gallop  Abdomen:     Soft, non-tender, bowel sounds active all four quadrants,    no masses, no organomegaly  Genitalia:    deferred  Rectal:    Extremities:   Extremities normal, atraumatic, no cyanosis or edema  Pulses:   2+ and symmetric all extremities  Skin:   Skin color, texture,  turgor normal, no rashes or lesions  Lymph nodes:   Cervical, supraclavicular, and axillary nodes normal  Neurologic:   CNII-XII intact. Normal strength, sensation and reflexes      throughout          Assessment & Plan:

## 2023-11-16 ENCOUNTER — Telehealth: Payer: Self-pay

## 2023-11-16 ENCOUNTER — Encounter: Payer: Self-pay | Admitting: Physical Therapy

## 2023-11-16 ENCOUNTER — Ambulatory Visit: Payer: Medicare Other | Admitting: Physical Therapy

## 2023-11-16 DIAGNOSIS — M5416 Radiculopathy, lumbar region: Secondary | ICD-10-CM

## 2023-11-16 DIAGNOSIS — M6281 Muscle weakness (generalized): Secondary | ICD-10-CM

## 2023-11-16 DIAGNOSIS — M5459 Other low back pain: Secondary | ICD-10-CM

## 2023-11-16 NOTE — Telephone Encounter (Signed)
Pt has been notified Pt states he will call after Christmas and talk about having other labs done

## 2023-11-16 NOTE — Therapy (Signed)
OUTPATIENT PHYSICAL THERAPY THORACOLUMBAR EVALUATION   Patient Name: Dan Sanchez MRN: [id number] DOB:[birth year], 67 y.o., male Today's Date: 11/16/2023  END OF SESSION:  PT End of Session - 11/16/23 1515     Visit Number 2    Date for PT Re-Evaluation 01/11/24    PT Start Time 1515    PT Stop Time 1600    PT Time Calculation (min) 45 min    Activity Tolerance Patient tolerated treatment well    Behavior During Therapy [facility] for tasks assessed/performed              Past Medical History:  Diagnosis Date   Allergy    CAD (coronary artery disease)    mild non-obstructive   GERD (gastroesophageal reflux disease)    Hyperlipidemia    S/P right rotator cuff repair    Sinusitis, bacterial    Urinary incontinence    Past Surgical History:  Procedure Laterality Date   COLONOSCOPY  04/08/2009   LEFT HEART CATHETERIZATION WITH CORONARY ANGIOGRAM N/A 08/13/2014   Procedure: LEFT HEART CATHETERIZATION WITH CORONARY ANGIOGRAM;  Surgeon: Kathleene Hazel, MD;  Location: [facility] CATH LAB;  Service: Cardiovascular;  Laterality: N/A;   LUMBAR LAMINECTOMY FOR EPIDURAL ABSCESS N/A 08/30/2023   Procedure: LUMBAR LAMINECTOMY LUMBAR THREE-LUMBAR FOUR, LUMBAR FOUR-LUMBAR FIVE WITH EVACUATION OF EPIDURAL ABSCESS;  Surgeon: Jadene Pierini, MD;  Location: [facility] OR;  Service: Neurosurgery;  Laterality: N/A;   SHOULDER ARTHROSCOPY WITH ROTATOR CUFF REPAIR Right 10/26/2017   Procedure: RIGHT SHOULDER ARTHROSCOPY WITH ROTATOR CUFF REPAIR AND DEBRIDEMENT;  Surgeon: Eugenia Mcalpine, MD;  Location: [facility];  Service: Orthopedics;  Laterality: Right;   SHOULDER SURGERY     Right shoulder    TEE WITHOUT CARDIOVERSION N/A 09/04/2023   Procedure: TRANSESOPHAGEAL ECHOCARDIOGRAM;  Surgeon: Pricilla Riffle, MD;  Location: [facility] INVASIVE CV LAB;  Service: Cardiovascular;  Laterality: N/A;   Patient Active Problem List   Diagnosis Date Noted   Epidural abscess 11/14/2023   MSSA bacteremia  08/31/2023   Septic arthritis of lumbar spine (HCC) 08/29/2023   Normocytic anemia 08/29/2023   Elevated LFTs 08/29/2023   Osteoarthritis of knee 03/01/2023   Impingement syndrome of right shoulder region 02/27/2023   Coronary artery disease involving native coronary artery of native heart without angina pectoris 12/15/2022   Spinal stenosis of lumbar region with neurogenic claudication 03/23/2021   Lumbar radiculopathy 02/04/2021   HTN (hypertension) 03/29/2019   Overweight (BMI 25.0-29.9) 03/29/2019   Nasal septal deviation 02/01/2019   Nasal turbinate hypertrophy 02/01/2019   S/P right rotator cuff repair 10/26/2017   Contact dermatitis 08/27/2015   HSV-1 (herpes simplex virus 1) infection 08/27/2015   Bee sting allergy 09/10/2014   Chest pain 08/11/2014   Allergic rhinitis 08/10/2014   Allergic conjunctivitis 02/20/2014   Insomnia 02/03/2014   GERD (gastroesophageal reflux disease) 02/03/2014   General medical examination 03/16/2012   Hyperlipidemia 12/24/2007   ERECTILE DYSFUNCTION 12/24/2007    PCP: Beverely Low, MD  REFERRING PROVIDER: Shon Baton, MD  REFERRING DIAG: lumbar radiculopathy  Rationale for Evaluation and Treatment: Rehabilitation  THERAPY DIAG:  Other low back pain  Radiculopathy, lumbar region  Muscle weakness (generalized)  ONSET DATE: 08/30/23  SUBJECTIVE:  SUBJECTIVE STATEMENT: Doing ok, hit golf balls yesterday, back is just stiff no pain  PERTINENT HISTORY:  Lumbar laminectomy L3-5 08/30/23  PAIN:  Are you having pain? Yes: NPRS scale: 0/10 Pain location: just feel really stiff and a little sore Pain description: stiff and sore Aggravating factors: just get sore with activity Relieving factors: stretching,  PRECAUTIONS: None  RED FLAGS: None   WEIGHT BEARING  RESTRICTIONS: No  FALLS:  Has patient fallen in last 6 months? Yes, when legs were giving out  LIVING ENVIRONMENT: Lives with: lives with their family and lives with their spouse Lives in: House/apartment Stairs: Yes: Internal: 12 steps; bilateral but cannot reach both Has following equipment at home: None  OCCUPATION: sitting in sales, some driving  PLOF: Independent and yardwork, some pickleball 1 every month, golf every week  PATIENT GOALS: have less pain, move better, get my stamina and strength back  NEXT MD VISIT: none scheduled  OBJECTIVE:   DIAGNOSTIC FINDINGS:  Older X-ray with significant stenosis  COGNITION: Overall cognitive status: Within functional limits for tasks assessed     SENSATION: WFL  MUSCLE LENGTH: Very tight HS 55 degree SLR, right leg feels "more than left" Very tight ITB and piriformis mms + Thomas test bilateral, very tight quads  POSTURE: rounded shoulders, forward head, and decreased lumbar lordosis, slouched sitting posture  PALPATION: Tight with mild tenderness , scar is well healed  LUMBAR ROM:   AROM eval  Flexion Decreased 75%   Extension Decreased 75%   Right lateral flexion Decreased 75%  Left lateral flexion Decreased 75%  Right rotation   Left rotation    (Blank rows = not tested)  LOWER EXTREMITY ROM:     Stiff hips and HS  LOWER EXTREMITY MMT:   Right hip 4-/5, left hip 4+, knees 5-, ankles 4+  LUMBAR SPECIAL TESTS:  Straight leg raise test: Positive and Slump test: Negative    FUNCTIONAL TESTS:  Timed up and go (TUG): 10 seconds Patient does have right shoulder pain, I did a quick assessment of this, he has very weak RC bilaterally, ER was 3+/5.  He has right shoulder ROM that is limited with ER, flexion and abduction, negative impingement test, + empty can and + Yergason's test.  Again very poor posture may influence the strain on the right biceps GAIT: Distance walked: 100 feet Assistive device utilized:  None Level of assistance: Complete Independence Comments: forward flexed posture  TODAY'S TREATMENT:                                                                                                                              DATE:   11/16/23 NuStep L5 x 6 min Rows & Lats 35lb 2x10  Shoulder Ext 10lb 2x10 W Backs 2x10 S2S OHP yellow ball 2x10 Bridges 2x10 LE on Pball bridges, oblq, K2C  Passive LE stretch to HS, Piriformis, single K2S, Double K2C, lower trunk rotations  PATIENT EDUCATION:  Education details: HEP/POC Person educated: Patient Education method: Explanation, Demonstration, Tactile cues, and Verbal cues Education comprehension: verbalized understanding  HOME EXERCISE PROGRAM: Access Code: [email or web address] URL: [email or web address] Date: 11/16/2023 Prepared by: Debroah Baller  Exercises - Supine Hamstring Stretch with Strap  - 1 x daily - 7 x weekly - 3 sets - 10 reps - Supine Bridge  - 1 x daily - 7 x weekly - 3 sets - 10 reps - Supine Figure 4 Piriformis Stretch  - 1 x daily - 7 x weekly - 3 sets - 10 reps  ASSESSMENT:  CLINICAL IMPRESSION: Patient is a 67  y.o. male who was seen today for physical therapy evaluation and treatment for s/p lumbar laminectomy with septic arthritis on 08/30/23 past MRI's show significant stenosis.  He has been on antibiotics and overall just reports feeling stiff and weakness and unable to do what he was doing prior, like golf, pickle ball, yard work  He remains very tight in the HS, very limited in his lumbar ROM, very poor posture. Strengthening exercises focused on posterior chain to aind with posture as well as stretching the anterior thoracic cavity.  OBJECTIVE IMPAIRMENTS: Abnormal gait, cardiopulmonary status limiting activity, decreased activity tolerance, decreased balance, decreased endurance, decreased mobility, difficulty walking, decreased ROM, decreased strength, increased muscle spasms, impaired flexibility,  improper body mechanics, postural dysfunction, and pain.   REHAB POTENTIAL: Good  CLINICAL DECISION MAKING: Stable/uncomplicated  EVALUATION COMPLEXITY: Low   GOALS: Goals reviewed with patient? Yes  SHORT TERM GOALS: Target date: 11/28/23  Independent with initial HEP Goal status: INITIAL  LONG TERM GOALS: Target date: 01/11/24  Independent with advanced HEP Goal status: INITIAL  2.  Decrease pain 50% Goal status: INITIAL  3.  Increase lumbar ROM 25% Goal status: INITIAL  4.  Increase SLR to 70 degrees Goal status: INITIAL  5.  Be able to play a round of golf without difficulty Goal status: INITIAL  PLAN:  PT FREQUENCY: 2x/week  PT DURATION: 12 weeks  PLANNED INTERVENTIONS: Therapeutic exercises, Therapeutic activity, Neuromuscular re-education, Balance training, Gait training, Patient/Family education, Self Care, Joint mobilization, Joint manipulation, Dry Needling, Electrical stimulation, Spinal manipulation, Spinal mobilization, Cryotherapy, Moist heat, Traction, Ultrasound, and Manual therapy.  PLAN FOR NEXT SESSION: start core and flexibility exercises and really focus on posture   Grayce Sessions, PTA 11/16/2023, 3:16 PM

## 2023-11-16 NOTE — Telephone Encounter (Signed)
-----   Message from Neena Rhymes sent at 11/16/2023 11:18 AM EST ----- PSA is now normal- great news!  I didn't repeat the other labs b/c of all the blood work you had over the last few months.  If you would like to repeat it we can certainly do so at a lab visit at your convenience.  Just let me know

## 2023-11-20 ENCOUNTER — Encounter: Payer: Self-pay | Admitting: Physical Therapy

## 2023-11-20 ENCOUNTER — Ambulatory Visit: Payer: Medicare Other | Admitting: Physical Therapy

## 2023-11-20 DIAGNOSIS — M5459 Other low back pain: Secondary | ICD-10-CM | POA: Diagnosis not present

## 2023-11-20 DIAGNOSIS — M6281 Muscle weakness (generalized): Secondary | ICD-10-CM

## 2023-11-20 DIAGNOSIS — M5416 Radiculopathy, lumbar region: Secondary | ICD-10-CM | POA: Diagnosis not present

## 2023-11-20 NOTE — Therapy (Signed)
OUTPATIENT PHYSICAL THERAPY THORACOLUMBAR EVALUATION   Patient Name: Dan Sanchez MRN: [id number] DOB:27-Dec-1955, 67 y.o., male Today's Date: 11/20/2023  END OF SESSION:  PT End of Session - 11/20/23 1426     Visit Number 3    Date for PT Re-Evaluation 01/11/24    PT Start Time 1430    PT Stop Time 1515    PT Time Calculation (min) 45 min    Activity Tolerance Patient tolerated treatment well    Behavior During Therapy [facility] for tasks assessed/performed              Past Medical History:  Diagnosis Date   Allergy    CAD (coronary artery disease)    mild non-obstructive   GERD (gastroesophageal reflux disease)    Hyperlipidemia    S/P right rotator cuff repair    Sinusitis, bacterial    Urinary incontinence    Past Surgical History:  Procedure Laterality Date   COLONOSCOPY  04/08/2009   LEFT HEART CATHETERIZATION WITH CORONARY ANGIOGRAM N/A 08/13/2014   Procedure: LEFT HEART CATHETERIZATION WITH CORONARY ANGIOGRAM;  Surgeon: Kathleene Hazel, MD;  Location: [facility] CATH LAB;  Service: Cardiovascular;  Laterality: N/A;   LUMBAR LAMINECTOMY FOR EPIDURAL ABSCESS N/A 08/30/2023   Procedure: LUMBAR LAMINECTOMY LUMBAR THREE-LUMBAR FOUR, LUMBAR FOUR-LUMBAR FIVE WITH EVACUATION OF EPIDURAL ABSCESS;  Surgeon: Jadene Pierini, MD;  Location: [facility] OR;  Service: Neurosurgery;  Laterality: N/A;   SHOULDER ARTHROSCOPY WITH ROTATOR CUFF REPAIR Right 10/26/2017   Procedure: RIGHT SHOULDER ARTHROSCOPY WITH ROTATOR CUFF REPAIR AND DEBRIDEMENT;  Surgeon: Eugenia Mcalpine, MD;  Location: [facility];  Service: Orthopedics;  Laterality: Right;   SHOULDER SURGERY     Right shoulder    TEE WITHOUT CARDIOVERSION N/A 09/04/2023   Procedure: TRANSESOPHAGEAL ECHOCARDIOGRAM;  Surgeon: Pricilla Riffle, MD;  Location: [facility] INVASIVE CV LAB;  Service: Cardiovascular;  Laterality: N/A;   Patient Active Problem List   Diagnosis Date Noted   Epidural abscess 11/14/2023   MSSA bacteremia  08/31/2023   Septic arthritis of lumbar spine (HCC) 08/29/2023   Normocytic anemia 08/29/2023   Elevated LFTs 08/29/2023   Osteoarthritis of knee 03/01/2023   Impingement syndrome of right shoulder region 02/27/2023   Coronary artery disease involving native coronary artery of native heart without angina pectoris 12/15/2022   Spinal stenosis of lumbar region with neurogenic claudication 03/23/2021   Lumbar radiculopathy 02/04/2021   HTN (hypertension) 03/29/2019   Overweight (BMI 25.0-29.9) 03/29/2019   Nasal septal deviation 02/01/2019   Nasal turbinate hypertrophy 02/01/2019   S/P right rotator cuff repair 10/26/2017   Contact dermatitis 08/27/2015   HSV-1 (herpes simplex virus 1) infection 08/27/2015   Bee sting allergy 09/10/2014   Chest pain 08/11/2014   Allergic rhinitis 08/10/2014   Allergic conjunctivitis 02/20/2014   Insomnia 02/03/2014   GERD (gastroesophageal reflux disease) 02/03/2014   General medical examination 03/16/2012   Hyperlipidemia 12/24/2007   ERECTILE DYSFUNCTION 12/24/2007    PCP: Beverely Low, MD  REFERRING PROVIDER: Shon Baton, MD  REFERRING DIAG: lumbar radiculopathy  Rationale for Evaluation and Treatment: Rehabilitation  THERAPY DIAG:  Radiculopathy, lumbar region  Muscle weakness (generalized)  Other low back pain  ONSET DATE: 08/30/23  SUBJECTIVE:  SUBJECTIVE STATEMENT: "Good" stretched yesterday PERTINENT HISTORY:  Lumbar laminectomy L3-5 08/30/23  PAIN:  Are you having pain? Yes: NPRS scale: 0/10 Pain location: just feel really stiff and a little sore Pain description: stiff and sore Aggravating factors: just get sore with activity Relieving factors: stretching,  PRECAUTIONS: None  RED FLAGS: None   WEIGHT BEARING RESTRICTIONS: No  FALLS:  Has  patient fallen in last 6 months? Yes, when legs were giving out  LIVING ENVIRONMENT: Lives with: lives with their family and lives with their spouse Lives in: House/apartment Stairs: Yes: Internal: 12 steps; bilateral but cannot reach both Has following equipment at home: None  OCCUPATION: sitting in sales, some driving  PLOF: Independent and yardwork, some pickleball 1 every month, golf every week  PATIENT GOALS: have less pain, move better, get my stamina and strength back  NEXT MD VISIT: none scheduled  OBJECTIVE:   DIAGNOSTIC FINDINGS:  Older X-ray with significant stenosis  COGNITION: Overall cognitive status: Within functional limits for tasks assessed     SENSATION: WFL  MUSCLE LENGTH: Very tight HS 55 degree SLR, right leg feels "more than left" Very tight ITB and piriformis mms + Thomas test bilateral, very tight quads  POSTURE: rounded shoulders, forward head, and decreased lumbar lordosis, slouched sitting posture  PALPATION: Tight with mild tenderness , scar is well healed  LUMBAR ROM:   AROM eval  Flexion Decreased 75%   Extension Decreased 75%   Right lateral flexion Decreased 75%  Left lateral flexion Decreased 75%  Right rotation   Left rotation    (Blank rows = not tested)  LOWER EXTREMITY ROM:     Stiff hips and HS  LOWER EXTREMITY MMT:   Right hip 4-/5, left hip 4+, knees 5-, ankles 4+  LUMBAR SPECIAL TESTS:  Straight leg raise test: Positive and Slump test: Negative    FUNCTIONAL TESTS:  Timed up and go (TUG): 10 seconds Patient does have right shoulder pain, I did a quick assessment of this, he has very weak RC bilaterally, ER was 3+/5.  He has right shoulder ROM that is limited with ER, flexion and abduction, negative impingement test, + empty can and + Yergason's test.  Again very poor posture may influence the strain on the right biceps GAIT: Distance walked: 100 feet Assistive device utilized: None Level of assistance: Complete  Independence Comments: forward flexed posture  TODAY'S TREATMENT:                                                                                                                              DATE:   11/20/23 NuStep L 5 x 6 min HS curls 35lb 2x10 Leg Ext 10lb 2x10  Shoulder ER yellow 2x10 Horiz abd green 2x10  Shoulder Ext 10lb 2x10  Supine bridges  Bridges w/ March  Passive LE stretch to HS, Piriformis, single K2S, Double K2C, lower trunk rotations  11/16/23 NuStep L5 x 6 min Rows & Lats  35lb 2x10  Shoulder Ext 10lb 2x10 W Backs 2x10 S2S OHP yellow ball 2x10 Bridges 2x10 LE on Pball bridges, oblq, K2C  Passive LE stretch to HS, Piriformis, single K2S, Double K2C, lower trunk rotations  PATIENT EDUCATION:  Education details: HEP/POC Person educated: Patient Education method: Programmer, multimedia, Demonstration, Tactile cues, and Verbal cues Education comprehension: verbalized understanding  HOME EXERCISE PROGRAM: Access Code: [email or web address] URL: [email or web address] Date: 11/16/2023 Prepared by: Debroah Baller  Exercises - Supine Hamstring Stretch with Strap  - 1 x daily - 7 x weekly - 3 sets - 10 reps - Supine Bridge  - 1 x daily - 7 x weekly - 3 sets - 10 reps - Supine Figure 4 Piriformis Stretch  - 1 x daily - 7 x weekly - 3 sets - 10 reps  ASSESSMENT:  CLINICAL IMPRESSION: Patient is a 67  y.o. male who was seen today for physical therapy evaluation and treatment for s/p lumbar laminectomy with septic arthritis on 08/30/23 past MRI's show significant stenosis.  He has been on antibiotics and overall just reports feeling stiff and weakness and unable to do what he was doing prior, like golf, pickle ball, and yard work  He remains very tight in the HS, very limited in his lumbar ROM, very poor posture. Again Strengthening exercises focused on posterior chain and stretching the anterior thoracic cavity. No issue with the addition of LE curls and extensions. Reports  compliance with HEP  OBJECTIVE IMPAIRMENTS: Abnormal gait, cardiopulmonary status limiting activity, decreased activity tolerance, decreased balance, decreased endurance, decreased mobility, difficulty walking, decreased ROM, decreased strength, increased muscle spasms, impaired flexibility, improper body mechanics, postural dysfunction, and pain.   REHAB POTENTIAL: Good  CLINICAL DECISION MAKING: Stable/uncomplicated  EVALUATION COMPLEXITY: Low   GOALS: Goals reviewed with patient? Yes  SHORT TERM GOALS: Target date: 11/28/23  Independent with initial HEP Goal status: Met 11/20/23  LONG TERM GOALS: Target date: 01/11/24  Independent with advanced HEP Goal status: INITIAL  2.  Decrease pain 50% Goal status: INITIAL  3.  Increase lumbar ROM 25% Goal status: INITIAL  4.  Increase SLR to 70 degrees Goal status: INITIAL  5.  Be able to play a round of golf without difficulty Goal status: INITIAL  PLAN:  PT FREQUENCY: 2x/week  PT DURATION: 12 weeks  PLANNED INTERVENTIONS: Therapeutic exercises, Therapeutic activity, Neuromuscular re-education, Balance training, Gait training, Patient/Family education, Self Care, Joint mobilization, Joint manipulation, Dry Needling, Electrical stimulation, Spinal manipulation, Spinal mobilization, Cryotherapy, Moist heat, Traction, Ultrasound, and Manual therapy.  PLAN FOR NEXT SESSION: start core and flexibility exercises and really focus on posture   Grayce Sessions, PTA 11/20/2023, 2:26 PM

## 2023-11-25 NOTE — Assessment & Plan Note (Signed)
[name] PE WNL.  UTD on colonoscopy and Tdap.  Due for PNA and flu but still on IV abx- will defer until later date.  Given all of his recent labs, will hold on repeating at this time.  Encouraged healthy diet and regular exercise.

## 2023-11-25 NOTE — Assessment & Plan Note (Signed)
Chronic problem.  Well controlled.  Currently asymptomatic.  Will continue to follow.

## 2023-11-25 NOTE — Assessment & Plan Note (Signed)
Chronic problem.  Tolerating Crestor 40mg  daily w/o difficulty.

## 2023-11-27 ENCOUNTER — Telehealth: Payer: Self-pay

## 2023-11-27 ENCOUNTER — Encounter: Payer: Self-pay | Admitting: Physical Therapy

## 2023-11-27 ENCOUNTER — Ambulatory Visit: Payer: Medicare Other | Admitting: Physical Therapy

## 2023-11-27 DIAGNOSIS — M5459 Other low back pain: Secondary | ICD-10-CM

## 2023-11-27 DIAGNOSIS — M6281 Muscle weakness (generalized): Secondary | ICD-10-CM

## 2023-11-27 DIAGNOSIS — M5416 Radiculopathy, lumbar region: Secondary | ICD-10-CM

## 2023-11-27 NOTE — Therapy (Signed)
OUTPATIENT PHYSICAL THERAPY THORACOLUMBAR TREATMENT   Patient Name: Dan Sanchez MRN: [id number] DOB:[birth year], 67 y.o., male Today's Date: 11/27/2023  END OF SESSION:  PT End of Session - 11/27/23 1621     Visit Number 4    Date for PT Re-Evaluation 01/11/24    Authorization Type BCBS    PT Start Time 1614    PT Stop Time 1700    PT Time Calculation (min) 46 min    Activity Tolerance Patient tolerated treatment well    Behavior During Therapy [facility] for tasks assessed/performed              Past Medical History:  Diagnosis Date   Allergy    CAD (coronary artery disease)    mild non-obstructive   GERD (gastroesophageal reflux disease)    Hyperlipidemia    S/P right rotator cuff repair    Sinusitis, bacterial    Urinary incontinence    Past Surgical History:  Procedure Laterality Date   COLONOSCOPY  04/08/2009   LEFT HEART CATHETERIZATION WITH CORONARY ANGIOGRAM N/A 08/13/2014   Procedure: LEFT HEART CATHETERIZATION WITH CORONARY ANGIOGRAM;  Surgeon: Kathleene Hazel, MD;  Location: [facility] CATH LAB;  Service: Cardiovascular;  Laterality: N/A;   LUMBAR LAMINECTOMY FOR EPIDURAL ABSCESS N/A 08/30/2023   Procedure: LUMBAR LAMINECTOMY LUMBAR THREE-LUMBAR FOUR, LUMBAR FOUR-LUMBAR FIVE WITH EVACUATION OF EPIDURAL ABSCESS;  Surgeon: Jadene Pierini, MD;  Location: [facility] OR;  Service: Neurosurgery;  Laterality: N/A;   SHOULDER ARTHROSCOPY WITH ROTATOR CUFF REPAIR Right 10/26/2017   Procedure: RIGHT SHOULDER ARTHROSCOPY WITH ROTATOR CUFF REPAIR AND DEBRIDEMENT;  Surgeon: Eugenia Mcalpine, MD;  Location: [facility];  Service: Orthopedics;  Laterality: Right;   SHOULDER SURGERY     Right shoulder    TEE WITHOUT CARDIOVERSION N/A 09/04/2023   Procedure: TRANSESOPHAGEAL ECHOCARDIOGRAM;  Surgeon: Pricilla Riffle, MD;  Location: [facility] INVASIVE CV LAB;  Service: Cardiovascular;  Laterality: N/A;   Patient Active Problem List   Diagnosis Date Noted   Epidural abscess  11/14/2023   MSSA bacteremia 08/31/2023   Septic arthritis of lumbar spine (HCC) 08/29/2023   Normocytic anemia 08/29/2023   Elevated LFTs 08/29/2023   Osteoarthritis of knee 03/01/2023   Impingement syndrome of right shoulder region 02/27/2023   Coronary artery disease involving native coronary artery of native heart without angina pectoris 12/15/2022   Spinal stenosis of lumbar region with neurogenic claudication 03/23/2021   Lumbar radiculopathy 02/04/2021   HTN (hypertension) 03/29/2019   Overweight (BMI 25.0-29.9) 03/29/2019   Nasal septal deviation 02/01/2019   Nasal turbinate hypertrophy 02/01/2019   S/P right rotator cuff repair 10/26/2017   Contact dermatitis 08/27/2015   HSV-1 (herpes simplex virus 1) infection 08/27/2015   Bee sting allergy 09/10/2014   Chest pain 08/11/2014   Allergic rhinitis 08/10/2014   Allergic conjunctivitis 02/20/2014   Insomnia 02/03/2014   GERD (gastroesophageal reflux disease) 02/03/2014   General medical examination 03/16/2012   Hyperlipidemia 12/24/2007   ERECTILE DYSFUNCTION 12/24/2007    PCP: Beverely Low, MD  REFERRING PROVIDER: Shon Baton, MD  REFERRING DIAG: lumbar radiculopathy  Rationale for Evaluation and Treatment: Rehabilitation  THERAPY DIAG:  Radiculopathy, lumbar region  Muscle weakness (generalized)  Other low back pain  ONSET DATE: 08/30/23  SUBJECTIVE:  SUBJECTIVE STATEMENT: Doing okay, some pain in the left hip at times PERTINENT HISTORY:  Lumbar laminectomy L3-5 08/30/23  PAIN:  Are you having pain? Yes: NPRS scale: 0/10 Pain location: just feel really stiff and a little sore Pain description: stiff and sore Aggravating factors: just get sore with activity Relieving factors: stretching,  PRECAUTIONS: None  RED  FLAGS: None   WEIGHT BEARING RESTRICTIONS: No  FALLS:  Has patient fallen in last 6 months? Yes, when legs were giving out  LIVING ENVIRONMENT: Lives with: lives with their family and lives with their spouse Lives in: House/apartment Stairs: Yes: Internal: 12 steps; bilateral but cannot reach both Has following equipment at home: None  OCCUPATION: sitting in sales, some driving  PLOF: Independent and yardwork, some pickleball 1 every month, golf every week  PATIENT GOALS: have less pain, move better, get my stamina and strength back  NEXT MD VISIT: none scheduled  OBJECTIVE:   DIAGNOSTIC FINDINGS:  Older X-ray with significant stenosis  COGNITION: Overall cognitive status: Within functional limits for tasks assessed     SENSATION: WFL  MUSCLE LENGTH: Very tight HS 55 degree SLR, right leg feels "more than left" Very tight ITB and piriformis mms + Thomas test bilateral, very tight quads  POSTURE: rounded shoulders, forward head, and decreased lumbar lordosis, slouched sitting posture  PALPATION: Tight with mild tenderness , scar is well healed  LUMBAR ROM:   AROM eval  Flexion Decreased 75%   Extension Decreased 75%   Right lateral flexion Decreased 75%  Left lateral flexion Decreased 75%  Right rotation   Left rotation    (Blank rows = not tested)  LOWER EXTREMITY ROM:     Stiff hips and HS  LOWER EXTREMITY MMT:   Right hip 4-/5, left hip 4+, knees 5-, ankles 4+  LUMBAR SPECIAL TESTS:  Straight leg raise test: Positive and Slump test: Negative    FUNCTIONAL TESTS:  Timed up and go (TUG): 10 seconds Patient does have right shoulder pain, I did a quick assessment of this, he has very weak RC bilaterally, ER was 3+/5.  He has right shoulder ROM that is limited with ER, flexion and abduction, negative impingement test, + empty can and + Yergason's test.  Again very poor posture may influence the strain on the right biceps GAIT: Distance walked: 100  feet Assistive device utilized: None Level of assistance: Complete Independence Comments: forward flexed posture  TODAY'S TREATMENT:                                                                                                                              DATE:   11/27/23 Nustep level 5 x 6 minutes HS curls 45 # 2x10 Leg extension 10# 10# straight arm pulls on airex On airex 5# chest press Back to wall W backs 10# sit to stand over head press 20# AR press 5# hip extension 5# hip abduction Isometric abs Passive LE stretches Calf  stretch Yellow tband ER Red tband horizontal abduction  11/20/23 NuStep L 5 x 6 min HS curls 35lb 2x10 Leg Ext 10lb 2x10  Shoulder ER yellow 2x10 Horiz abd green 2x10  Shoulder Ext 10lb 2x10  Supine bridges  Bridges w/ March  Passive LE stretch to HS, Piriformis, single K2S, Double K2C, lower trunk rotations  11/16/23 NuStep L5 x 6 min Rows & Lats 35lb 2x10  Shoulder Ext 10lb 2x10 W Backs 2x10 S2S OHP yellow ball 2x10 Bridges 2x10 LE on Pball bridges, oblq, K2C  Passive LE stretch to HS, Piriformis, single K2S, Double K2C, lower trunk rotations  PATIENT EDUCATION:  Education details: HEP/POC Person educated: Patient Education method: Programmer, multimedia, Demonstration, Actor cues, and Verbal cues Education comprehension: verbalized understanding  HOME EXERCISE PROGRAM: Access Code: [email or web address] URL: [email or web address] Date: 11/16/2023 Prepared by: Debroah Baller  Exercises - Supine Hamstring Stretch with Strap  - 1 x daily - 7 x weekly - 3 sets - 10 reps - Supine Bridge  - 1 x daily - 7 x weekly - 3 sets - 10 reps - Supine Figure 4 Piriformis Stretch  - 1 x daily - 7 x weekly - 3 sets - 10 reps  ASSESSMENT:  CLINICAL IMPRESSION: Patient is a 67  y.o. male who was seen today for physical therapy evaluation and treatment for s/p lumbar laminectomy with septic arthritis on 08/30/23 past MRI's show significant stenosis.   He has been on antibiotics and overall just reports feeling stiff and weakness and unable to do what he was doing prior, like golf, pickle ball, and yard work  We continue to add to the overall strength and function with increased core and extremity exercises, We also continue to stretch and he is improving with this especially HS flexibility, still tight but much improved.  Does need cues for posture and core activation  OBJECTIVE IMPAIRMENTS: Abnormal gait, cardiopulmonary status limiting activity, decreased activity tolerance, decreased balance, decreased endurance, decreased mobility, difficulty walking, decreased ROM, decreased strength, increased muscle spasms, impaired flexibility, improper body mechanics, postural dysfunction, and pain.   REHAB POTENTIAL: Good  CLINICAL DECISION MAKING: Stable/uncomplicated  EVALUATION COMPLEXITY: Low   GOALS: Goals reviewed with patient? Yes  SHORT TERM GOALS: Target date: 11/28/23  Independent with initial HEP Goal status: Met 11/20/23  LONG TERM GOALS: Target date: 01/11/24  Independent with advanced HEP Goal status: INITIAL  2.  Decrease pain 50% Goal status: progressing 11/27/23  3.  Increase lumbar ROM 25% Goal status: INITIAL  4.  Increase SLR to 70 degrees Goal status:progressing 11/27/23  5.  Be able to play a round of golf without difficulty Goal status: progressing 11/27/23  PLAN:  PT FREQUENCY: 2x/week  PT DURATION: 12 weeks  PLANNED INTERVENTIONS: Therapeutic exercises, Therapeutic activity, Neuromuscular re-education, Balance training, Gait training, Patient/Family education, Self Care, Joint mobilization, Joint manipulation, Dry Needling, Electrical stimulation, Spinal manipulation, Spinal mobilization, Cryotherapy, Moist heat, Traction, Ultrasound, and Manual therapy.  PLAN FOR NEXT SESSION: Continue to progress as tolerated  Jearld Lesch, PT 11/27/2023, 4:23 PM

## 2023-11-27 NOTE — Telephone Encounter (Signed)
Per Dr. Drue Second patient can stop taking the doxycycline and his labs look good.  She will recheck labs while off of antibiotics at his follow up visit. Patient informed and verbalized understanding. Deeric Cruise Jonathon Resides, CMA

## 2023-12-01 ENCOUNTER — Encounter: Payer: Self-pay | Admitting: Physical Therapy

## 2023-12-01 ENCOUNTER — Ambulatory Visit: Payer: Medicare Other | Attending: Orthopedic Surgery | Admitting: Physical Therapy

## 2023-12-01 DIAGNOSIS — M5416 Radiculopathy, lumbar region: Secondary | ICD-10-CM | POA: Insufficient documentation

## 2023-12-01 DIAGNOSIS — M6281 Muscle weakness (generalized): Secondary | ICD-10-CM | POA: Diagnosis not present

## 2023-12-01 DIAGNOSIS — M5459 Other low back pain: Secondary | ICD-10-CM | POA: Insufficient documentation

## 2023-12-01 NOTE — Therapy (Signed)
 OUTPATIENT PHYSICAL THERAPY THORACOLUMBAR TREATMENT   Patient Name: Dan Sanchez MRN: [id number] DOB:1956-06-06, 68 y.o., male Today's Date: 12/01/2023  END OF SESSION:  PT End of Session - 12/01/23 0840     Visit Number 5    Date for PT Re-Evaluation 01/11/24    Authorization Type BCBS    PT Start Time 0840    PT Stop Time 0927    PT Time Calculation (min) 47 min    Activity Tolerance Patient tolerated treatment well    Behavior During Therapy [facility] for tasks assessed/performed              Past Medical History:  Diagnosis Date   Allergy    CAD (coronary artery disease)    mild non-obstructive   GERD (gastroesophageal reflux disease)    Hyperlipidemia    S/P right rotator cuff repair    Sinusitis, bacterial    Urinary incontinence    Past Surgical History:  Procedure Laterality Date   COLONOSCOPY  04/08/2009   LEFT HEART CATHETERIZATION WITH CORONARY ANGIOGRAM N/A 08/13/2014   Procedure: LEFT HEART CATHETERIZATION WITH CORONARY ANGIOGRAM;  Surgeon: Lonni JONETTA Cash, MD;  Location: [facility] CATH LAB;  Service: Cardiovascular;  Laterality: N/A;   LUMBAR LAMINECTOMY FOR EPIDURAL ABSCESS N/A 08/30/2023   Procedure: LUMBAR LAMINECTOMY LUMBAR THREE-LUMBAR FOUR, LUMBAR FOUR-LUMBAR FIVE WITH EVACUATION OF EPIDURAL ABSCESS;  Surgeon: Cheryle Debby LABOR, MD;  Location: [facility] OR;  Service: Neurosurgery;  Laterality: N/A;   SHOULDER ARTHROSCOPY WITH ROTATOR CUFF REPAIR Right 10/26/2017   Procedure: RIGHT SHOULDER ARTHROSCOPY WITH ROTATOR CUFF REPAIR AND DEBRIDEMENT;  Surgeon: Gerome Charleston, MD;  Location: [facility];  Service: Orthopedics;  Laterality: Right;   SHOULDER SURGERY     Right shoulder    TEE WITHOUT CARDIOVERSION N/A 09/04/2023   Procedure: TRANSESOPHAGEAL ECHOCARDIOGRAM;  Surgeon: Okey Vina GAILS, MD;  Location: [facility] INVASIVE CV LAB;  Service: Cardiovascular;  Laterality: N/A;   Patient Active Problem List   Diagnosis Date Noted   Epidural abscess  11/14/2023   MSSA bacteremia 08/31/2023   Septic arthritis of lumbar spine (HCC) 08/29/2023   Normocytic anemia 08/29/2023   Elevated LFTs 08/29/2023   Osteoarthritis of knee 03/01/2023   Impingement syndrome of right shoulder region 02/27/2023   Coronary artery disease involving native coronary artery of native heart without angina pectoris 12/15/2022   Spinal stenosis of lumbar region with neurogenic claudication 03/23/2021   Lumbar radiculopathy 02/04/2021   HTN (hypertension) 03/29/2019   Overweight (BMI 25.0-29.9) 03/29/2019   Nasal septal deviation 02/01/2019   Nasal turbinate hypertrophy 02/01/2019   S/P right rotator cuff repair 10/26/2017   Contact dermatitis 08/27/2015   HSV-1 (herpes simplex virus 1) infection 08/27/2015   Bee sting allergy 09/10/2014   Chest pain 08/11/2014   Allergic rhinitis 08/10/2014   Allergic conjunctivitis 02/20/2014   Insomnia 02/03/2014   GERD (gastroesophageal reflux disease) 02/03/2014   General medical examination 03/16/2012   Hyperlipidemia 12/24/2007   ERECTILE DYSFUNCTION 12/24/2007    PCP: Mahlon, MD  REFERRING PROVIDER: Burnetta, MD  REFERRING DIAG: lumbar radiculopathy  Rationale for Evaluation and Treatment: Rehabilitation  THERAPY DIAG:  Radiculopathy, lumbar region  Muscle weakness (generalized)  Other low back pain  ONSET DATE: 08/30/23  SUBJECTIVE:  SUBJECTIVE STATEMENT: Not doing too bad, just feeling tight PERTINENT HISTORY:  Lumbar laminectomy L3-5 08/30/23  PAIN:  Are you having pain? Yes: NPRS scale: 0/10 Pain location: just feel really stiff and a little sore Pain description: stiff and sore Aggravating factors: just get sore with activity Relieving factors: stretching,  PRECAUTIONS: None  RED FLAGS: None   WEIGHT  BEARING RESTRICTIONS: No  FALLS:  Has patient fallen in last 6 months? Yes, when legs were giving out  LIVING ENVIRONMENT: Lives with: lives with their family and lives with their spouse Lives in: House/apartment Stairs: Yes: Internal: 12 steps; bilateral but cannot reach both Has following equipment at home: None  OCCUPATION: sitting in sales, some driving  PLOF: Independent and yardwork, some pickleball 1 every month, golf every week  PATIENT GOALS: have less pain, move better, get my stamina and strength back  NEXT MD VISIT: none scheduled  OBJECTIVE:   DIAGNOSTIC FINDINGS:  Older X-ray with significant stenosis  COGNITION: Overall cognitive status: Within functional limits for tasks assessed     SENSATION: WFL  MUSCLE LENGTH: Very tight HS 55 degree SLR, right leg feels more than left Very tight ITB and piriformis mms + Thomas test bilateral, very tight quads  POSTURE: rounded shoulders, forward head, and decreased lumbar lordosis, slouched sitting posture  PALPATION: Tight with mild tenderness , scar is well healed  LUMBAR ROM:   AROM eval  Flexion Decreased 75%   Extension Decreased 75%   Right lateral flexion Decreased 75%  Left lateral flexion Decreased 75%  Right rotation   Left rotation    (Blank rows = not tested)  LOWER EXTREMITY ROM:     Stiff hips and HS  LOWER EXTREMITY MMT:   Right hip 4-/5, left hip 4+, knees 5-, ankles 4+  LUMBAR SPECIAL TESTS:  Straight leg raise test: Positive and Slump test: Negative    FUNCTIONAL TESTS:  Timed up and go (TUG): 10 seconds Patient does have right shoulder pain, I did a quick assessment of this, he has very weak RC bilaterally, ER was 3+/5.  He has right shoulder ROM that is limited with ER, flexion and abduction, negative impingement test, + empty can and + Yergason's test.  Again very poor posture may influence the strain on the right biceps GAIT: Distance walked: 100 feet Assistive device  utilized: None Level of assistance: Complete Independence Comments: forward flexed posture  TODAY'S TREATMENT:                                                                                                                              DATE:   12/01/23 Nustep level 6 x 5 minutes Tmill pushes 10# straight arm pulls 20# AR press Green tband horizontal abduction Yellow tband ER Lats 45# 3x10 Leg press 60# 3x10 W backs 10# bird dog rows On upside down bosu ball toss Passive stretch LE's Isometric abs  11/27/23 Nustep level 5 x 6 minutes HS  curls 45 # 2x10 Leg extension 10# 10# straight arm pulls on airex On airex 5# chest press Back to wall W backs 10# sit to stand over head press 20# AR press 5# hip extension 5# hip abduction Isometric abs Passive LE stretches Calf stretch Yellow tband ER Red tband horizontal abduction  11/20/23 NuStep L 5 x 6 min HS curls 35lb 2x10 Leg Ext 10lb 2x10  Shoulder ER yellow 2x10 Horiz abd green 2x10  Shoulder Ext 10lb 2x10  Supine bridges  Bridges w/ March  Passive LE stretch to HS, Piriformis, single K2S, Double K2C, lower trunk rotations  11/16/23 NuStep L5 x 6 min Rows & Lats 35lb 2x10  Shoulder Ext 10lb 2x10 W Backs 2x10 S2S OHP yellow ball 2x10 Bridges 2x10 LE on Pball bridges, oblq, K2C  Passive LE stretch to HS, Piriformis, single K2S, Double K2C, lower trunk rotations  PATIENT EDUCATION:  Education details: HEP/POC Person educated: Patient Education method: Programmer, Multimedia, Demonstration, Actor cues, and Verbal cues Education comprehension: verbalized understanding  HOME EXERCISE PROGRAM: Access Code: [email or web address] URL: [email or web address] Date: 11/16/2023 Prepared by: Tanda Sorrow  Exercises - Supine Hamstring Stretch with Strap  - 1 x daily - 7 x weekly - 3 sets - 10 reps - Supine Bridge  - 1 x daily - 7 x weekly - 3 sets - 10 reps - Supine Figure 4 Piriformis Stretch  - 1 x daily - 7 x weekly -  3 sets - 10 reps  ASSESSMENT:  CLINICAL IMPRESSION: Patient is a 68  y.o. male who was seen today for physical therapy evaluation and treatment for s/p lumbar laminectomy with septic arthritis on 08/30/23 past MRI's show significant stenosis.  He has been on antibiotics and overall just reports feeling stiff and weakness and unable to do what he was doing prior, like golf, pickle ball, and yard work  We continue to add to the overall strength and function with increased core and extremity exercises, I added bird dog rows and on bosu ball toss, needed assist at first but then was able to do on own with cues.  Does need cues for posture and core activation  OBJECTIVE IMPAIRMENTS: Abnormal gait, cardiopulmonary status limiting activity, decreased activity tolerance, decreased balance, decreased endurance, decreased mobility, difficulty walking, decreased ROM, decreased strength, increased muscle spasms, impaired flexibility, improper body mechanics, postural dysfunction, and pain.   REHAB POTENTIAL: Good  CLINICAL DECISION MAKING: Stable/uncomplicated  EVALUATION COMPLEXITY: Low   GOALS: Goals reviewed with patient? Yes  SHORT TERM GOALS: Target date: 11/28/23  Independent with initial HEP Goal status: Met 11/20/23  LONG TERM GOALS: Target date: 01/11/24  Independent with advanced HEP Goal status: INITIAL  2.  Decrease pain 50% Goal status: progressing 11/27/23  3.  Increase lumbar ROM 25% Goal status: INITIAL  4.  Increase SLR to 70 degrees Goal status:progressing 11/27/23  5.  Be able to play a round of golf without difficulty Goal status: progressing 11/27/23  PLAN:  PT FREQUENCY: 2x/week  PT DURATION: 12 weeks  PLANNED INTERVENTIONS: Therapeutic exercises, Therapeutic activity, Neuromuscular re-education, Balance training, Gait training, Patient/Family education, Self Care, Joint mobilization, Joint manipulation, Dry Needling, Electrical stimulation, Spinal manipulation,  Spinal mobilization, Cryotherapy, Moist heat, Traction, Ultrasound, and Manual therapy.  PLAN FOR NEXT SESSION: Continue to progress as tolerated  Alinah Sheard W, PT 12/01/2023, 8:40 AM

## 2023-12-04 ENCOUNTER — Encounter: Payer: Self-pay | Admitting: Physical Therapy

## 2023-12-04 ENCOUNTER — Ambulatory Visit: Payer: Medicare Other | Admitting: Physical Therapy

## 2023-12-04 DIAGNOSIS — M5416 Radiculopathy, lumbar region: Secondary | ICD-10-CM | POA: Diagnosis not present

## 2023-12-04 DIAGNOSIS — M5459 Other low back pain: Secondary | ICD-10-CM

## 2023-12-04 DIAGNOSIS — M6281 Muscle weakness (generalized): Secondary | ICD-10-CM

## 2023-12-04 NOTE — Therapy (Signed)
 OUTPATIENT PHYSICAL THERAPY THORACOLUMBAR TREATMENT   Patient Name: Dan Sanchez MRN: [id number] DOB:09/15/1956, 68 y.o., male Today's Date: 12/04/2023  END OF SESSION:  PT End of Session - 12/04/23 1546     Visit Number 6    Date for PT Re-Evaluation 01/11/24    PT Start Time 1545    PT Stop Time 1630    PT Time Calculation (min) 45 min    Activity Tolerance Patient tolerated treatment well    Behavior During Therapy [facility] for tasks assessed/performed              Past Medical History:  Diagnosis Date   Allergy    CAD (coronary artery disease)    mild non-obstructive   GERD (gastroesophageal reflux disease)    Hyperlipidemia    S/P right rotator cuff repair    Sinusitis, bacterial    Urinary incontinence    Past Surgical History:  Procedure Laterality Date   COLONOSCOPY  04/08/2009   LEFT HEART CATHETERIZATION WITH CORONARY ANGIOGRAM N/A 08/13/2014   Procedure: LEFT HEART CATHETERIZATION WITH CORONARY ANGIOGRAM;  Surgeon: Lonni JONETTA Cash, MD;  Location: [facility] CATH LAB;  Service: Cardiovascular;  Laterality: N/A;   LUMBAR LAMINECTOMY FOR EPIDURAL ABSCESS N/A 08/30/2023   Procedure: LUMBAR LAMINECTOMY LUMBAR THREE-LUMBAR FOUR, LUMBAR FOUR-LUMBAR FIVE WITH EVACUATION OF EPIDURAL ABSCESS;  Surgeon: Cheryle Debby LABOR, MD;  Location: [facility] OR;  Service: Neurosurgery;  Laterality: N/A;   SHOULDER ARTHROSCOPY WITH ROTATOR CUFF REPAIR Right 10/26/2017   Procedure: RIGHT SHOULDER ARTHROSCOPY WITH ROTATOR CUFF REPAIR AND DEBRIDEMENT;  Surgeon: Gerome Charleston, MD;  Location: [facility];  Service: Orthopedics;  Laterality: Right;   SHOULDER SURGERY     Right shoulder    TEE WITHOUT CARDIOVERSION N/A 09/04/2023   Procedure: TRANSESOPHAGEAL ECHOCARDIOGRAM;  Surgeon: Okey Vina GAILS, MD;  Location: [facility] INVASIVE CV LAB;  Service: Cardiovascular;  Laterality: N/A;   Patient Active Problem List   Diagnosis Date Noted   Epidural abscess 11/14/2023   MSSA bacteremia  08/31/2023   Septic arthritis of lumbar spine (HCC) 08/29/2023   Normocytic anemia 08/29/2023   Elevated LFTs 08/29/2023   Osteoarthritis of knee 03/01/2023   Impingement syndrome of right shoulder region 02/27/2023   Coronary artery disease involving native coronary artery of native heart without angina pectoris 12/15/2022   Spinal stenosis of lumbar region with neurogenic claudication 03/23/2021   Lumbar radiculopathy 02/04/2021   HTN (hypertension) 03/29/2019   Overweight (BMI 25.0-29.9) 03/29/2019   Nasal septal deviation 02/01/2019   Nasal turbinate hypertrophy 02/01/2019   S/P right rotator cuff repair 10/26/2017   Contact dermatitis 08/27/2015   HSV-1 (herpes simplex virus 1) infection 08/27/2015   Bee sting allergy 09/10/2014   Chest pain 08/11/2014   Allergic rhinitis 08/10/2014   Allergic conjunctivitis 02/20/2014   Insomnia 02/03/2014   GERD (gastroesophageal reflux disease) 02/03/2014   General medical examination 03/16/2012   Hyperlipidemia 12/24/2007   ERECTILE DYSFUNCTION 12/24/2007    PCP: Mahlon, MD  REFERRING PROVIDER: Burnetta, MD  REFERRING DIAG: lumbar radiculopathy  Rationale for Evaluation and Treatment: Rehabilitation  THERAPY DIAG:  Radiculopathy, lumbar region  Muscle weakness (generalized)  Other low back pain  ONSET DATE: 08/30/23  SUBJECTIVE:  SUBJECTIVE STATEMENT: Good PERTINENT HISTORY:  Lumbar laminectomy L3-5 08/30/23  PAIN:  Are you having pain? Yes: NPRS scale: 0/10 Pain location: just feel really stiff and a little sore Pain description: stiff and sore Aggravating factors: just get sore with activity Relieving factors: stretching,  PRECAUTIONS: None  RED FLAGS: None   WEIGHT BEARING RESTRICTIONS: No  FALLS:  Has patient fallen in last 6  months? Yes, when legs were giving out  LIVING ENVIRONMENT: Lives with: lives with their family and lives with their spouse Lives in: House/apartment Stairs: Yes: Internal: 12 steps; bilateral but cannot reach both Has following equipment at home: None  OCCUPATION: sitting in sales, some driving  PLOF: Independent and yardwork, some pickleball 1 every month, golf every week  PATIENT GOALS: have less pain, move better, get my stamina and strength back  NEXT MD VISIT: none scheduled  OBJECTIVE:   DIAGNOSTIC FINDINGS:  Older X-ray with significant stenosis  COGNITION: Overall cognitive status: Within functional limits for tasks assessed     SENSATION: WFL  MUSCLE LENGTH: Very tight HS 55 degree SLR, right leg feels more than left Very tight ITB and piriformis mms + Thomas test bilateral, very tight quads  POSTURE: rounded shoulders, forward head, and decreased lumbar lordosis, slouched sitting posture  PALPATION: Tight with mild tenderness , scar is well healed  LUMBAR ROM:   AROM eval  Flexion Decreased 75%   Extension Decreased 75%   Right lateral flexion Decreased 75%  Left lateral flexion Decreased 75%  Right rotation   Left rotation    (Blank rows = not tested)  LOWER EXTREMITY ROM:     Stiff hips and HS  LOWER EXTREMITY MMT:   Right hip 4-/5, left hip 4+, knees 5-, ankles 4+  LUMBAR SPECIAL TESTS:  Straight leg raise test: Positive and Slump test: Negative    FUNCTIONAL TESTS:  Timed up and go (TUG): 10 seconds Patient does have right shoulder pain, I did a quick assessment of this, he has very weak RC bilaterally, ER was 3+/5.  He has right shoulder ROM that is limited with ER, flexion and abduction, negative impingement test, + empty can and + Yergason's test.  Again very poor posture may influence the strain on the right biceps GAIT: Distance walked: 100 feet Assistive device utilized: None Level of assistance: Complete Independence Comments:  forward flexed posture  TODAY'S TREATMENT:                                                                                                                              DATE:   12/04/23 Nustep level 6 x 5 minutes S2S on airex w/ yellow ball chest press 2x10  Horix abd green 2x12  W backs  Leg press 60# 2x15 Shoulder Ext 15lb 3x10  Standing Rows 20lb 3x10 HS curls 45lb 2x15 Leg Ext 15lb 2x15  Bilat SLR 2x10 LE on Pball bridges Passive stretch LE's  12/01/23 Nustep level 6  x 5 minutes Tmill pushes 10# straight arm pulls 20# AR press Green tband horizontal abduction Yellow tband ER Lats 45# 3x10 Leg press 60# 3x10 W backs 10# bird dog rows On upside down bosu ball toss Passive stretch LE's Isometric abs  11/27/23 Nustep level 5 x 6 minutes HS curls 45 # 2x10 Leg extension 10# 10# straight arm pulls on airex On airex 5# chest press Back to wall W backs 10# sit to stand over head press 20# AR press 5# hip extension 5# hip abduction Isometric abs Passive LE stretches Calf stretch Yellow tband ER Red tband horizontal abduction  11/20/23 NuStep L 5 x 6 min HS curls 35lb 2x10 Leg Ext 10lb 2x10  Shoulder ER yellow 2x10 Horiz abd green 2x10  Shoulder Ext 10lb 2x10  Supine bridges  Bridges w/ March  Passive LE stretch to HS, Piriformis, single K2S, Double K2C, lower trunk rotations  11/16/23 NuStep L5 x 6 min Rows & Lats 35lb 2x10  Shoulder Ext 10lb 2x10 W Backs 2x10 S2S OHP yellow ball 2x10 Bridges 2x10 LE on Pball bridges, oblq, K2C  Passive LE stretch to HS, Piriformis, single K2S, Double K2C, lower trunk rotations  PATIENT EDUCATION:  Education details: HEP/POC Person educated: Patient Education method: Programmer, Multimedia, Demonstration, Actor cues, and Verbal cues Education comprehension: verbalized understanding  HOME EXERCISE PROGRAM: Access Code: [email or web address] URL: [email or web address] Date: 11/16/2023 Prepared by: Tanda Sorrow  Exercises - Supine Hamstring Stretch with Strap  - 1 x daily - 7 x weekly - 3 sets - 10 reps - Supine Bridge  - 1 x daily - 7 x weekly - 3 sets - 10 reps - Supine Figure 4 Piriformis Stretch  - 1 x daily - 7 x weekly - 3 sets - 10 reps  ASSESSMENT:  CLINICAL IMPRESSION: Patient is a 68  y.o. male who was seen today for physical therapy evaluation and treatment for s/p lumbar laminectomy with septic arthritis on 08/30/23 past MRI's show significant stenosis.  He has been on antibiotics and overall just reports feeling stiff and weakness and unable to do what he was doing prior, like golf, pickle ball, and yard work  We continue to add to the overall strength and function with increased core and extremity exercises. Pt does require cues for posture and core activation during session. Increase reps tolerated on all machine level interventions. Overpressure provided with W backs.  OBJECTIVE IMPAIRMENTS: Abnormal gait, cardiopulmonary status limiting activity, decreased activity tolerance, decreased balance, decreased endurance, decreased mobility, difficulty walking, decreased ROM, decreased strength, increased muscle spasms, impaired flexibility, improper body mechanics, postural dysfunction, and pain.   REHAB POTENTIAL: Good  CLINICAL DECISION MAKING: Stable/uncomplicated  EVALUATION COMPLEXITY: Low   GOALS: Goals reviewed with patient? Yes  SHORT TERM GOALS: Target date: 11/28/23  Independent with initial HEP Goal status: Met 11/20/23  LONG TERM GOALS: Target date: 01/11/24  Independent with advanced HEP Goal status: INITIAL  2.  Decrease pain 50% Goal status: progressing 11/27/23  3.  Increase lumbar ROM 25% Goal status: INITIAL  4.  Increase SLR to 70 degrees Goal status:progressing 11/27/23  5.  Be able to play a round of golf without difficulty Goal status: progressing 11/27/23  PLAN:  PT FREQUENCY: 2x/week  PT DURATION: 12 weeks  PLANNED INTERVENTIONS:  Therapeutic exercises, Therapeutic activity, Neuromuscular re-education, Balance training, Gait training, Patient/Family education, Self Care, Joint mobilization, Joint manipulation, Dry Needling, Electrical stimulation, Spinal manipulation, Spinal mobilization, Cryotherapy, Moist heat, Traction, Ultrasound, and Manual therapy.  PLAN FOR  NEXT SESSION: Continue to progress as tolerated  Tanda KANDICE Sorrow, PTA 12/04/2023, 3:46 PM

## 2023-12-08 ENCOUNTER — Ambulatory Visit: Payer: Medicare Other | Admitting: Physical Therapy

## 2023-12-08 ENCOUNTER — Encounter: Payer: Self-pay | Admitting: Physical Therapy

## 2023-12-08 DIAGNOSIS — M5416 Radiculopathy, lumbar region: Secondary | ICD-10-CM

## 2023-12-08 DIAGNOSIS — M6281 Muscle weakness (generalized): Secondary | ICD-10-CM | POA: Diagnosis not present

## 2023-12-08 DIAGNOSIS — M5459 Other low back pain: Secondary | ICD-10-CM | POA: Diagnosis not present

## 2023-12-08 NOTE — Therapy (Signed)
 OUTPATIENT PHYSICAL THERAPY THORACOLUMBAR TREATMENT   Patient Name: Dan Sanchez MRN: [id number] DOB:1956/05/08, 68 y.o., male Today's Date: 12/08/2023  END OF SESSION:  PT End of Session - 12/08/23 0756     Visit Number 7    Date for PT Re-Evaluation 01/11/24    PT Start Time 0757    PT Stop Time 0842    PT Time Calculation (min) 45 min    Activity Tolerance Patient tolerated treatment well    Behavior During Therapy [facility] for tasks assessed/performed              Past Medical History:  Diagnosis Date   Allergy    CAD (coronary artery disease)    mild non-obstructive   GERD (gastroesophageal reflux disease)    Hyperlipidemia    S/P right rotator cuff repair    Sinusitis, bacterial    Urinary incontinence    Past Surgical History:  Procedure Laterality Date   COLONOSCOPY  04/08/2009   LEFT HEART CATHETERIZATION WITH CORONARY ANGIOGRAM N/A 08/13/2014   Procedure: LEFT HEART CATHETERIZATION WITH CORONARY ANGIOGRAM;  Surgeon: Lonni JONETTA Cash, MD;  Location: [facility] CATH LAB;  Service: Cardiovascular;  Laterality: N/A;   LUMBAR LAMINECTOMY FOR EPIDURAL ABSCESS N/A 08/30/2023   Procedure: LUMBAR LAMINECTOMY LUMBAR THREE-LUMBAR FOUR, LUMBAR FOUR-LUMBAR FIVE WITH EVACUATION OF EPIDURAL ABSCESS;  Surgeon: Cheryle Debby LABOR, MD;  Location: [facility] OR;  Service: Neurosurgery;  Laterality: N/A;   SHOULDER ARTHROSCOPY WITH ROTATOR CUFF REPAIR Right 10/26/2017   Procedure: RIGHT SHOULDER ARTHROSCOPY WITH ROTATOR CUFF REPAIR AND DEBRIDEMENT;  Surgeon: Gerome Charleston, MD;  Location: [facility];  Service: Orthopedics;  Laterality: Right;   SHOULDER SURGERY     Right shoulder    TEE WITHOUT CARDIOVERSION N/A 09/04/2023   Procedure: TRANSESOPHAGEAL ECHOCARDIOGRAM;  Surgeon: Okey Vina GAILS, MD;  Location: [facility] INVASIVE CV LAB;  Service: Cardiovascular;  Laterality: N/A;   Patient Active Problem List   Diagnosis Date Noted   Epidural abscess 11/14/2023   MSSA bacteremia  08/31/2023   Septic arthritis of lumbar spine (HCC) 08/29/2023   Normocytic anemia 08/29/2023   Elevated LFTs 08/29/2023   Osteoarthritis of knee 03/01/2023   Impingement syndrome of right shoulder region 02/27/2023   Coronary artery disease involving native coronary artery of native heart without angina pectoris 12/15/2022   Spinal stenosis of lumbar region with neurogenic claudication 03/23/2021   Lumbar radiculopathy 02/04/2021   HTN (hypertension) 03/29/2019   Overweight (BMI 25.0-29.9) 03/29/2019   Nasal septal deviation 02/01/2019   Nasal turbinate hypertrophy 02/01/2019   S/P right rotator cuff repair 10/26/2017   Contact dermatitis 08/27/2015   HSV-1 (herpes simplex virus 1) infection 08/27/2015   Bee sting allergy 09/10/2014   Chest pain 08/11/2014   Allergic rhinitis 08/10/2014   Allergic conjunctivitis 02/20/2014   Insomnia 02/03/2014   GERD (gastroesophageal reflux disease) 02/03/2014   General medical examination 03/16/2012   Hyperlipidemia 12/24/2007   ERECTILE DYSFUNCTION 12/24/2007    PCP: Mahlon, MD  REFERRING PROVIDER: Burnetta, MD  REFERRING DIAG: lumbar radiculopathy  Rationale for Evaluation and Treatment: Rehabilitation  THERAPY DIAG:  Radiculopathy, lumbar region  Muscle weakness (generalized)  Other low back pain  ONSET DATE: 08/30/23  SUBJECTIVE:  SUBJECTIVE STATEMENT: Good PERTINENT HISTORY:  Lumbar laminectomy L3-5 08/30/23  PAIN:  Are you having pain? Yes: NPRS scale: 0/10 Pain location: just feel really stiff and a little sore Pain description: stiff and sore Aggravating factors: just get sore with activity Relieving factors: stretching,  PRECAUTIONS: None  RED FLAGS: None   WEIGHT BEARING RESTRICTIONS: No  FALLS:  Has patient fallen in last 6  months? Yes, when legs were giving out  LIVING ENVIRONMENT: Lives with: lives with their family and lives with their spouse Lives in: House/apartment Stairs: Yes: Internal: 12 steps; bilateral but cannot reach both Has following equipment at home: None  OCCUPATION: sitting in sales, some driving  PLOF: Independent and yardwork, some pickleball 1 every month, golf every week  PATIENT GOALS: have less pain, move better, get my stamina and strength back  NEXT MD VISIT: none scheduled  OBJECTIVE:   DIAGNOSTIC FINDINGS:  Older X-ray with significant stenosis  COGNITION: Overall cognitive status: Within functional limits for tasks assessed     SENSATION: WFL  MUSCLE LENGTH: Very tight HS 55 degree SLR, right leg feels more than left Very tight ITB and piriformis mms + Thomas test bilateral, very tight quads  POSTURE: rounded shoulders, forward head, and decreased lumbar lordosis, slouched sitting posture  PALPATION: Tight with mild tenderness , scar is well healed  LUMBAR ROM:   AROM eval 12/08/23  Flexion Decreased 75%  Limited  25&  Extension Decreased 75%  Limited 50%  Right lateral flexion Decreased 75% Limited 25%  Left lateral flexion Decreased 75% Limited 25%  Right rotation  WFL  Left rotation  WFL   (Blank rows = not tested)  LOWER EXTREMITY ROM:     Stiff hips and HS  LOWER EXTREMITY MMT:   Right hip 4-/5, left hip 4+, knees 5-, ankles 4+  LUMBAR SPECIAL TESTS:  Straight leg raise test: Positive and Slump test: Negative    FUNCTIONAL TESTS:  Timed up and go (TUG): 10 seconds Patient does have right shoulder pain, I did a quick assessment of this, he has very weak RC bilaterally, ER was 3+/5.  He has right shoulder ROM that is limited with ER, flexion and abduction, negative impingement test, + empty can and + Yergason's test.  Again very poor posture may influence the strain on the right biceps GAIT: Distance walked: 100 feet Assistive device  utilized: None Level of assistance: Complete Independence Comments: forward flexed posture  TODAY'S TREATMENT:                                                                                                                              DATE:   12/08/23 Nustep level 5 x 6 minutes CHECKED GOALS Seated Rows & Lats 45lb 2x12 Leg press 60# 3x10 Slant board stretch 20# AR press S2S holding 20lb 2x10 Dead bug LE only 3x5 each  LE on Pball, bridges, Obg, K2C HS, piriformis, single K2x, Double K2C, glute stretch  12/04/23 Nustep level 6 x 5 minutes S2S on airex w/ yellow ball chest press 2x10  Horix abd green 2x12  W backs  Leg press 60# 2x15 Shoulder Ext 15lb 3x10  Standing Rows 20lb 3x10 HS curls 45lb 2x15 Leg Ext 15lb 2x15  Bilat SLR 2x10 LE on Pball bridges Passive stretch LE's  12/01/23 Nustep level 6 x 5 minutes Tmill pushes 10# straight arm pulls 20# AR press Green tband horizontal abduction Yellow tband ER Lats 45# 3x10 Leg press 60# 3x10 W backs 10# bird dog rows On upside down bosu ball toss Passive stretch LE's Isometric abs  11/27/23 Nustep level 5 x 6 minutes HS curls 45 # 2x10 Leg extension 10# 10# straight arm pulls on airex On airex 5# chest press Back to wall W backs 10# sit to stand over head press 20# AR press 5# hip extension 5# hip abduction Isometric abs Passive LE stretches Calf stretch Yellow tband ER Red tband horizontal abduction  PATIENT EDUCATION:  Education details: HEP/POC Person educated: Patient Education method: Programmer, Multimedia, Demonstration, Actor cues, and Verbal cues Education comprehension: verbalized understanding  HOME EXERCISE PROGRAM: Access Code: [email or web address] URL: [email or web address] Date: 11/16/2023 Prepared by: Tanda Sorrow  Exercises - Supine Hamstring Stretch with Strap  - 1 x daily - 7 x weekly - 3 sets - 10 reps - Supine Bridge  - 1 x daily - 7 x weekly - 3 sets - 10 reps - Supine Figure  4 Piriformis Stretch  - 1 x daily - 7 x weekly - 3 sets - 10 reps  ASSESSMENT:  CLINICAL IMPRESSION: Patient is a 68  y.o. male who was seen today for physical therapy evaluation and treatment for s/p lumbar laminectomy with septic arthritis on 08/30/23 past MRI's show significant stenosis.  He has been on antibiotics and overall just reports feeling stiff and weakness and unable to do what he was doing prior, like golf, pickle ball, and yard work. He has progressed increasing his lumbar AROM meeting his goal. Pt also reprots decrease pain overall.  We continue to add to the overall strength and function with increased core and extremity exercises. Pt does require cues for posture and core activation during session. Increase resistance tolerated on leg press. Core weakness noted with modified dead bugs, pt visibly shaking.   OBJECTIVE IMPAIRMENTS: Abnormal gait, cardiopulmonary status limiting activity, decreased activity tolerance, decreased balance, decreased endurance, decreased mobility, difficulty walking, decreased ROM, decreased strength, increased muscle spasms, impaired flexibility, improper body mechanics, postural dysfunction, and pain.   REHAB POTENTIAL: Good  CLINICAL DECISION MAKING: Stable/uncomplicated  EVALUATION COMPLEXITY: Low   GOALS: Goals reviewed with patient? Yes  SHORT TERM GOALS: Target date: 11/28/23  Independent with initial HEP Goal status: Met 11/20/23  LONG TERM GOALS: Target date: 01/11/24  Independent with advanced HEP Goal status: INITIAL  2.  Decrease pain 50% Goal status: Met 12/08/23  3.  Increase lumbar ROM 25% Goal status: Met 12/07/22  4.  Increase SLR to 70 degrees Goal status:progressing 11/27/23  5.  Be able to play a round of golf without difficulty Goal status: progressing 11/27/23  PLAN:  PT FREQUENCY: 2x/week  PT DURATION: 12 weeks  PLANNED INTERVENTIONS: Therapeutic exercises, Therapeutic activity, Neuromuscular re-education,  Balance training, Gait training, Patient/Family education, Self Care, Joint mobilization, Joint manipulation, Dry Needling, Electrical stimulation, Spinal manipulation, Spinal mobilization, Cryotherapy, Moist heat, Traction, Ultrasound, and Manual therapy.  PLAN FOR NEXT SESSION: Continue to progress as tolerated  Tanda KANDICE Sorrow, PTA 12/08/2023, 7:57 AM

## 2023-12-11 ENCOUNTER — Ambulatory Visit: Payer: Medicare Other | Admitting: Physical Therapy

## 2023-12-11 ENCOUNTER — Other Ambulatory Visit: Payer: Self-pay

## 2023-12-11 ENCOUNTER — Encounter: Payer: Self-pay | Admitting: Internal Medicine

## 2023-12-11 ENCOUNTER — Encounter: Payer: Self-pay | Admitting: Physical Therapy

## 2023-12-11 ENCOUNTER — Ambulatory Visit: Payer: Medicare Other | Admitting: Internal Medicine

## 2023-12-11 VITALS — BP 149/79 | HR 79 | Resp 16 | Ht 70.0 in | Wt 196.0 lb

## 2023-12-11 DIAGNOSIS — Z96619 Presence of unspecified artificial shoulder joint: Secondary | ICD-10-CM | POA: Diagnosis not present

## 2023-12-11 DIAGNOSIS — G061 Intraspinal abscess and granuloma: Secondary | ICD-10-CM

## 2023-12-11 DIAGNOSIS — M6281 Muscle weakness (generalized): Secondary | ICD-10-CM | POA: Diagnosis not present

## 2023-12-11 DIAGNOSIS — M5459 Other low back pain: Secondary | ICD-10-CM

## 2023-12-11 DIAGNOSIS — M5416 Radiculopathy, lumbar region: Secondary | ICD-10-CM | POA: Diagnosis not present

## 2023-12-11 NOTE — Progress Notes (Signed)
 Patient ID: Dan Sanchez, male   DOB: 04-Jan-1956, 68 y.o.   MRN: [id number]  HPI Dan Sanchez is a 69yo M who completed abtx for prosthetic joint infection of shoulder. He reports doing well. Recently did work out -- Not too painful/no soreness from shoveling snow PT 2 times per week  2 wk since stopping abtx. No diarrhea  Outpatient Encounter Medications as of 12/11/2023  Medication Sig   acetaminophen  (TYLENOL ) 325 MG tablet Take 2 tablets (650 mg total) by mouth every 8 (eight) hours as needed for mild pain.   amLODipine  (NORVASC ) 10 MG tablet Take 1 tablet (10 mg total) by mouth daily.   ascorbic acid  (VITAMIN C ) 1000 MG tablet Take by mouth.   aspirin  EC 81 MG tablet Take 1 tablet (81 mg total) by mouth daily. Swallow whole.   ceFAZolin  (ANCEF ) 10 g injection    celecoxib  (CELEBREX ) 200 MG capsule Take 1 capsule (200 mg total) by mouth 2 (two) times daily.   fexofenadine (ALLEGRA) 180 MG tablet Take 180 mg by mouth daily.   fluticasone  (FLONASE ) 50 MCG/ACT nasal spray Place 2 sprays into both nostrils daily.   Omega-3 Fatty Acids (FISH OIL) 1200 MG CAPS Take 2,400 mg by mouth 2 (two) times daily.   omeprazole  (PRILOSEC) 20 MG capsule Take 1 capsule (20 mg total) by mouth daily.   rosuvastatin  (CRESTOR ) 40 MG tablet Take 1 tablet (40 mg total) by mouth daily.   rosuvastatin  (CRESTOR ) 40 MG tablet Take 1 tablet (40 mg total) by mouth daily.   doxycycline  (VIBRA -TABS) 100 MG tablet Take 1 tablet (100 mg total) by mouth 2 (two) times daily. Take on full stomach. Start on 11/14 (Patient not taking: Reported on 12/11/2023)   No facility-administered encounter medications on file as of 12/11/2023.     Patient Active Problem List   Diagnosis Date Noted   Epidural abscess 11/14/2023   MSSA bacteremia 08/31/2023   Septic arthritis of lumbar spine (HCC) 08/29/2023   Normocytic anemia 08/29/2023   Elevated LFTs 08/29/2023   Osteoarthritis of knee 03/01/2023   Impingement syndrome of  right shoulder region 02/27/2023   Coronary artery disease involving native coronary artery of native heart without angina pectoris 12/15/2022   Spinal stenosis of lumbar region with neurogenic claudication 03/23/2021   Lumbar radiculopathy 02/04/2021   HTN (hypertension) 03/29/2019   Overweight (BMI 25.0-29.9) 03/29/2019   Nasal septal deviation 02/01/2019   Nasal turbinate hypertrophy 02/01/2019   S/P right rotator cuff repair 10/26/2017   Contact dermatitis 08/27/2015   HSV-1 (herpes simplex virus 1) infection 08/27/2015   Bee sting allergy 09/10/2014   Chest pain 08/11/2014   Allergic rhinitis 08/10/2014   Allergic conjunctivitis 02/20/2014   Insomnia 02/03/2014   GERD (gastroesophageal reflux disease) 02/03/2014   General medical examination 03/16/2012   Hyperlipidemia 12/24/2007   ERECTILE DYSFUNCTION 12/24/2007     Health Maintenance Due  Topic Date Due   Zoster Vaccines- Shingrix (1 of 2) Never done     Review of Systems12 point ros is otherwise negative Physical Exam   BP (!) 149/79   Pulse 79   Resp 16   Ht 5' 10 (1.778 m)   Wt 196 lb (88.9 kg)   BMI 28.12 kg/m   Physical Exam  Constitutional: He is oriented to person, place, and time. He appears well-developed and well-nourished. No distress.  HENT:  Mouth/Throat: Oropharynx is clear and moist. No oropharyngeal exudate.  Cardiovascular: Normal rate, regular rhythm and normal heart  sounds. Exam reveals no gallop and no friction rub.  No murmur heard.  Pulmonary/Chest: Effort normal and breath sounds normal. No respiratory distress. He has no wheezes.  Neurological: He is alert and oriented to person, place, and time.  Skin: Skin is warm and dry. No rash noted. No erythema.  Psychiatric: He has a normal mood and affect. His behavior is normal.    CBC Lab Results  Component Value Date   WBC 10.5 09/04/2023   RBC 3.43 (L) 09/04/2023   HGB 9.8 (L) 09/04/2023   HCT 30.2 (L) 09/04/2023   PLT 407 (H)  09/04/2023   MCV 88.0 09/04/2023   MCH 28.6 09/04/2023   MCHC 32.5 09/04/2023   RDW 12.6 09/04/2023   LYMPHSABS 1.0 08/29/2023   MONOABS 1.0 08/29/2023   EOSABS 0.0 08/29/2023    BMET Lab Results  Component Value Date   NA 136 09/04/2023   K 3.6 09/04/2023   CL 101 09/04/2023   CO2 26 09/04/2023   GLUCOSE 127 (H) 09/04/2023   BUN 9 09/04/2023   CREATININE 0.77 09/04/2023   CALCIUM  8.2 (L) 09/04/2023   GFRNONAA >60 09/04/2023   Lab Results  Component Value Date   ESRSEDRATE 2 12/11/2023   Lab Results  Component Value Date   CRP <3.0 12/11/2023      Assessment and Plan  Hx of shoulder pji, completed treatment course - off of abtx for the past 2 weeks. Recommend to  -check sed rate and crp to see that they are still normalized  Will see back in 2 months to see that he is still doing well.

## 2023-12-11 NOTE — Therapy (Signed)
 OUTPATIENT PHYSICAL THERAPY THORACOLUMBAR TREATMENT   Patient Name: Dan Sanchez MRN: [id number] DOB:1956-09-26, 68 y.o., male Today's Date: 12/11/2023  END OF SESSION:  PT End of Session - 12/11/23 1345     Visit Number 8    Date for PT Re-Evaluation 01/11/24    PT Start Time 1345    PT Stop Time 1430    PT Time Calculation (min) 45 min    Activity Tolerance Patient tolerated treatment well    Behavior During Therapy [facility] for tasks assessed/performed              Past Medical History:  Diagnosis Date   Allergy    CAD (coronary artery disease)    mild non-obstructive   GERD (gastroesophageal reflux disease)    Hyperlipidemia    S/P right rotator cuff repair    Sinusitis, bacterial    Urinary incontinence    Past Surgical History:  Procedure Laterality Date   COLONOSCOPY  04/08/2009   LEFT HEART CATHETERIZATION WITH CORONARY ANGIOGRAM N/A 08/13/2014   Procedure: LEFT HEART CATHETERIZATION WITH CORONARY ANGIOGRAM;  Surgeon: Lonni JONETTA Cash, MD;  Location: [facility] CATH LAB;  Service: Cardiovascular;  Laterality: N/A;   LUMBAR LAMINECTOMY FOR EPIDURAL ABSCESS N/A 08/30/2023   Procedure: LUMBAR LAMINECTOMY LUMBAR THREE-LUMBAR FOUR, LUMBAR FOUR-LUMBAR FIVE WITH EVACUATION OF EPIDURAL ABSCESS;  Surgeon: Cheryle Debby LABOR, MD;  Location: [facility] OR;  Service: Neurosurgery;  Laterality: N/A;   SHOULDER ARTHROSCOPY WITH ROTATOR CUFF REPAIR Right 10/26/2017   Procedure: RIGHT SHOULDER ARTHROSCOPY WITH ROTATOR CUFF REPAIR AND DEBRIDEMENT;  Surgeon: Gerome Charleston, MD;  Location: [facility];  Service: Orthopedics;  Laterality: Right;   SHOULDER SURGERY     Right shoulder    TEE WITHOUT CARDIOVERSION N/A 09/04/2023   Procedure: TRANSESOPHAGEAL ECHOCARDIOGRAM;  Surgeon: Okey Vina GAILS, MD;  Location: [facility] INVASIVE CV LAB;  Service: Cardiovascular;  Laterality: N/A;   Patient Active Problem List   Diagnosis Date Noted   Epidural abscess 11/14/2023   MSSA bacteremia  08/31/2023   Septic arthritis of lumbar spine (HCC) 08/29/2023   Normocytic anemia 08/29/2023   Elevated LFTs 08/29/2023   Osteoarthritis of knee 03/01/2023   Impingement syndrome of right shoulder region 02/27/2023   Coronary artery disease involving native coronary artery of native heart without angina pectoris 12/15/2022   Spinal stenosis of lumbar region with neurogenic claudication 03/23/2021   Lumbar radiculopathy 02/04/2021   HTN (hypertension) 03/29/2019   Overweight (BMI 25.0-29.9) 03/29/2019   Nasal septal deviation 02/01/2019   Nasal turbinate hypertrophy 02/01/2019   S/P right rotator cuff repair 10/26/2017   Contact dermatitis 08/27/2015   HSV-1 (herpes simplex virus 1) infection 08/27/2015   Bee sting allergy 09/10/2014   Chest pain 08/11/2014   Allergic rhinitis 08/10/2014   Allergic conjunctivitis 02/20/2014   Insomnia 02/03/2014   GERD (gastroesophageal reflux disease) 02/03/2014   General medical examination 03/16/2012   Hyperlipidemia 12/24/2007   ERECTILE DYSFUNCTION 12/24/2007    PCP: Mahlon, MD  REFERRING PROVIDER: Burnetta, MD  REFERRING DIAG: lumbar radiculopathy  Rationale for Evaluation and Treatment: Rehabilitation  THERAPY DIAG:  Radiculopathy, lumbar region  Muscle weakness (generalized)  Other low back pain  ONSET DATE: 08/30/23  SUBJECTIVE:  SUBJECTIVE STATEMENT: Doing all right PERTINENT HISTORY:  Lumbar laminectomy L3-5 08/30/23  PAIN:  Are you having pain? Yes: NPRS scale: 0/10 Pain location: just feel really stiff and a little sore Pain description: stiff and sore Aggravating factors: just get sore with activity Relieving factors: stretching,  PRECAUTIONS: None  RED FLAGS: None   WEIGHT BEARING RESTRICTIONS: No  FALLS:  Has patient fallen  in last 6 months? Yes, when legs were giving out  LIVING ENVIRONMENT: Lives with: lives with their family and lives with their spouse Lives in: House/apartment Stairs: Yes: Internal: 12 steps; bilateral but cannot reach both Has following equipment at home: None  OCCUPATION: sitting in sales, some driving  PLOF: Independent and yardwork, some pickleball 1 every month, golf every week  PATIENT GOALS: have less pain, move better, get my stamina and strength back  NEXT MD VISIT: none scheduled  OBJECTIVE:   DIAGNOSTIC FINDINGS:  Older X-ray with significant stenosis  COGNITION: Overall cognitive status: Within functional limits for tasks assessed     SENSATION: WFL  MUSCLE LENGTH: Very tight HS 55 degree SLR, right leg feels more than left Very tight ITB and piriformis mms + Thomas test bilateral, very tight quads  POSTURE: rounded shoulders, forward head, and decreased lumbar lordosis, slouched sitting posture  PALPATION: Tight with mild tenderness , scar is well healed  LUMBAR ROM:   AROM eval 12/08/23  Flexion Decreased 75%  Limited  25&  Extension Decreased 75%  Limited 50%  Right lateral flexion Decreased 75% Limited 25%  Left lateral flexion Decreased 75% Limited 25%  Right rotation  WFL  Left rotation  WFL   (Blank rows = not tested)  LOWER EXTREMITY ROM:     Stiff hips and HS  LOWER EXTREMITY MMT:   Right hip 4-/5, left hip 4+, knees 5-, ankles 4+  LUMBAR SPECIAL TESTS:  Straight leg raise test: Positive and Slump test: Negative    FUNCTIONAL TESTS:  Timed up and go (TUG): 10 seconds Patient does have right shoulder pain, I did a quick assessment of this, he has very weak RC bilaterally, ER was 3+/5.  He has right shoulder ROM that is limited with ER, flexion and abduction, negative impingement test, + empty can and + Yergason's test.  Again very poor posture may influence the strain on the right biceps GAIT: Distance walked: 100 feet Assistive  device utilized: None Level of assistance: Complete Independence Comments: forward flexed posture  TODAY'S TREATMENT:                                                                                                                              DATE:   12/11/23 Nustep level 6 x 6 minutes Rows & Lats 55lb 2x10  HS curls 45lb 2x15 Leg Ext 20lb 2x15  Bird dogs 2x5 each  S2S OHP 10lb Dumbbell 2x10 SL bridges 2x10  Dead bug LE only 3x5 each  HS, piriformis, single K2x, Double  K2C, glute stretch, ITB  12/08/23 Nustep level 5 x 6 minutes CHECKED GOALS Seated Rows & Lats 45lb 2x12 Leg press 60# 3x10 Slant board stretch 20# AR press S2S holding 20lb 2x10 Dead bug LE only 3x5 each  LE on Pball, bridges, Obg, K2C HS, piriformis, single K2x, Double K2C, glute stretch  12/04/23 Nustep level 6 x 5 minutes S2S on airex w/ yellow ball chest press 2x10  Horix abd green 2x12  W backs  Leg press 60# 2x15 Shoulder Ext 15lb 3x10  Standing Rows 20lb 3x10 Nustep level 5 x 6 minutesHS curls 45lb 2x15 Leg Ext 15lb 2x15  Bilat SLR 2x10 LE on Pball bridges Passive stretch LE's  12/01/23 Nustep level 6 x 5 minutes Tmill pushes 10# straight arm pulls 20# AR press Green tband horizontal abduction Yellow tband ER Lats 45# 3x10 Leg press 60# 3x10 W backs 10# bird dog rows On upside down bosu ball toss Passive stretch LE's Isometric abs  PATIENT EDUCATION:  Education details: HEP/POC Person educated: Patient Education method: Programmer, Multimedia, Facilities Manager, Actor cues, and Verbal cues Education comprehension: verbalized understanding  HOME EXERCISE PROGRAM: Access Code: [email or web address] URL: [email or web address] Date: 11/16/2023 Prepared by: Tanda Sorrow  Exercises - Supine Hamstring Stretch with Strap  - 1 x daily - 7 x weekly - 3 sets - 10 reps - Supine Bridge  - 1 x daily - 7 x weekly - 3 sets - 10 reps - Supine Figure 4 Piriformis Stretch  - 1 x daily - 7 x weekly - 3  sets - 10 reps  ASSESSMENT:  CLINICAL IMPRESSION: Patient is a 68  y.o. male who was seen today for physical therapy treatment for s/p lumbar laminectomy with septic arthritis on 08/30/23 past MRI's show significant stenosis.  He has been on antibiotics and overall just reports feeling stiff and weakness and unable to do what he was doing prior, like golf, pickle ball, and yard work. We continue to add to the overall strength and function with increased core and extremity exercises. Pt does require cues for posture and core activation during session. Increase resistance tolerated with leg extentions. Core weakness noted with modified dead bugs and birddogs, pt visibly shaking with both interventions. Tightness remains in both LE's   OBJECTIVE IMPAIRMENTS: Abnormal gait, cardiopulmonary status limiting activity, decreased activity tolerance, decreased balance, decreased endurance, decreased mobility, difficulty walking, decreased ROM, decreased strength, increased muscle spasms, impaired flexibility, improper body mechanics, postural dysfunction, and pain.   REHAB POTENTIAL: Good  CLINICAL DECISION MAKING: Stable/uncomplicated  EVALUATION COMPLEXITY: Low   GOALS: Goals reviewed with patient? Yes  SHORT TERM GOALS: Target date: 11/28/23  Independent with initial HEP Goal status: Met 11/20/23  LONG TERM GOALS: Target date: 01/11/24  Independent with advanced HEP Goal status: INITIAL  2.  Decrease pain 50% Goal status: Met 12/08/23  3.  Increase lumbar ROM 25% Goal status: Met 12/07/22  4.  Increase SLR to 70 degrees Goal status:progressing 11/27/23  5.  Be able to play a round of golf without difficulty Goal status: progressing 11/27/23  PLAN:  PT FREQUENCY: 2x/week  PT DURATION: 12 weeks  PLANNED INTERVENTIONS: Therapeutic exercises, Therapeutic activity, Neuromuscular re-education, Balance training, Gait training, Patient/Family education, Self Care, Joint mobilization,  Joint manipulation, Dry Needling, Electrical stimulation, Spinal manipulation, Spinal mobilization, Cryotherapy, Moist heat, Traction, Ultrasound, and Manual therapy.  PLAN FOR NEXT SESSION: Continue to progress as tolerated  Tanda KANDICE Sorrow, PTA 12/11/2023, 1:46 PM

## 2023-12-12 LAB — C-REACTIVE PROTEIN: CRP: <3 mg/L (ref <8.0)

## 2023-12-12 LAB — SEDIMENTATION RATE: Sed Rate: 2 mm/h (ref 0–20)

## 2023-12-15 ENCOUNTER — Encounter: Payer: Self-pay | Admitting: Physical Therapy

## 2023-12-15 ENCOUNTER — Other Ambulatory Visit: Payer: Self-pay | Admitting: Cardiovascular Disease

## 2023-12-15 ENCOUNTER — Ambulatory Visit: Payer: Medicare Other | Admitting: Physical Therapy

## 2023-12-15 ENCOUNTER — Other Ambulatory Visit (HOSPITAL_BASED_OUTPATIENT_CLINIC_OR_DEPARTMENT_OTHER): Payer: Self-pay

## 2023-12-15 DIAGNOSIS — M6281 Muscle weakness (generalized): Secondary | ICD-10-CM | POA: Diagnosis not present

## 2023-12-15 DIAGNOSIS — M5459 Other low back pain: Secondary | ICD-10-CM | POA: Diagnosis not present

## 2023-12-15 DIAGNOSIS — M5416 Radiculopathy, lumbar region: Secondary | ICD-10-CM | POA: Diagnosis not present

## 2023-12-15 MED ORDER — AMLODIPINE BESYLATE 10 MG PO TABS
10.0000 mg | ORAL_TABLET | Freq: Every day | ORAL | 0 refills | Status: DC
  Filled 2023-12-15: qty 30, 30d supply, fill #0

## 2023-12-15 MED ORDER — ROSUVASTATIN CALCIUM 40 MG PO TABS
40.0000 mg | ORAL_TABLET | Freq: Every day | ORAL | 0 refills | Status: DC
  Filled 2023-12-15: qty 30, 30d supply, fill #0

## 2023-12-15 NOTE — Therapy (Signed)
OUTPATIENT PHYSICAL THERAPY THORACOLUMBAR TREATMENT   Patient Name: Dan Sanchez MRN: [id number] DOB:November 22, 1956, 68 y.o., male Today's Date: 12/15/2023  END OF SESSION:  PT End of Session - 12/15/23 1052     Visit Number 9    Date for PT Re-Evaluation 01/11/24    PT Start Time 1052    PT Stop Time 1137    PT Time Calculation (min) 45 min              Past Medical History:  Diagnosis Date   Allergy    CAD (coronary artery disease)    mild non-obstructive   GERD (gastroesophageal reflux disease)    Hyperlipidemia    S/P right rotator cuff repair    Sinusitis, bacterial    Urinary incontinence    Past Surgical History:  Procedure Laterality Date   COLONOSCOPY  04/08/2009   LEFT HEART CATHETERIZATION WITH CORONARY ANGIOGRAM N/A 08/13/2014   Procedure: LEFT HEART CATHETERIZATION WITH CORONARY ANGIOGRAM;  Surgeon: Kathleene Hazel, MD;  Location: [facility] CATH LAB;  Service: Cardiovascular;  Laterality: N/A;   LUMBAR LAMINECTOMY FOR EPIDURAL ABSCESS N/A 08/30/2023   Procedure: LUMBAR LAMINECTOMY LUMBAR THREE-LUMBAR FOUR, LUMBAR FOUR-LUMBAR FIVE WITH EVACUATION OF EPIDURAL ABSCESS;  Surgeon: Jadene Pierini, MD;  Location: [facility] OR;  Service: Neurosurgery;  Laterality: N/A;   SHOULDER ARTHROSCOPY WITH ROTATOR CUFF REPAIR Right 10/26/2017   Procedure: RIGHT SHOULDER ARTHROSCOPY WITH ROTATOR CUFF REPAIR AND DEBRIDEMENT;  Surgeon: Eugenia Mcalpine, MD;  Location: [facility];  Service: Orthopedics;  Laterality: Right;   SHOULDER SURGERY     Right shoulder    TEE WITHOUT CARDIOVERSION N/A 09/04/2023   Procedure: TRANSESOPHAGEAL ECHOCARDIOGRAM;  Surgeon: Pricilla Riffle, MD;  Location: [facility] INVASIVE CV LAB;  Service: Cardiovascular;  Laterality: N/A;   Patient Active Problem List   Diagnosis Date Noted   Epidural abscess 11/14/2023   MSSA bacteremia 08/31/2023   Septic arthritis of lumbar spine (HCC) 08/29/2023   Normocytic anemia 08/29/2023   Elevated LFTs  08/29/2023   Osteoarthritis of knee 03/01/2023   Impingement syndrome of right shoulder region 02/27/2023   Coronary artery disease involving native coronary artery of native heart without angina pectoris 12/15/2022   Spinal stenosis of lumbar region with neurogenic claudication 03/23/2021   Lumbar radiculopathy 02/04/2021   HTN (hypertension) 03/29/2019   Overweight (BMI 25.0-29.9) 03/29/2019   Nasal septal deviation 02/01/2019   Nasal turbinate hypertrophy 02/01/2019   S/P right rotator cuff repair 10/26/2017   Contact dermatitis 08/27/2015   HSV-1 (herpes simplex virus 1) infection 08/27/2015   Bee sting allergy 09/10/2014   Chest pain 08/11/2014   Allergic rhinitis 08/10/2014   Allergic conjunctivitis 02/20/2014   Insomnia 02/03/2014   GERD (gastroesophageal reflux disease) 02/03/2014   General medical examination 03/16/2012   Hyperlipidemia 12/24/2007   ERECTILE DYSFUNCTION 12/24/2007    PCP: Beverely Low, MD  REFERRING PROVIDER: Shon Baton, MD  REFERRING DIAG: lumbar radiculopathy  Rationale for Evaluation and Treatment: Rehabilitation  THERAPY DIAG:  Radiculopathy, lumbar region  Muscle weakness (generalized)  Other low back pain  ONSET DATE: 08/30/23  SUBJECTIVE:  SUBJECTIVE STATEMENT: "Good back is a little stiff" Ridding a lot this week PERTINENT HISTORY:  Lumbar laminectomy L3-5 08/30/23  PAIN:  Are you having pain? Yes: NPRS scale: 0/10 Pain location: just feel really stiff and a little sore Pain description: stiff and sore Aggravating factors: just get sore with activity Relieving factors: stretching,  PRECAUTIONS: None  RED FLAGS: None   WEIGHT BEARING RESTRICTIONS: No  FALLS:  Has patient fallen in last 6 months? Yes, when legs were giving out  LIVING  ENVIRONMENT: Lives with: lives with their family and lives with their spouse Lives in: House/apartment Stairs: Yes: Internal: 12 steps; bilateral but cannot reach both Has following equipment at home: None  OCCUPATION: sitting in sales, some driving  PLOF: Independent and yardwork, some pickleball 1 every month, golf every week  PATIENT GOALS: have less pain, move better, get my stamina and strength back  NEXT MD VISIT: none scheduled  OBJECTIVE:   DIAGNOSTIC FINDINGS:  Older X-ray with significant stenosis  COGNITION: Overall cognitive status: Within functional limits for tasks assessed     SENSATION: WFL  MUSCLE LENGTH: Very tight HS 55 degree SLR, right leg feels "more than left" Very tight ITB and piriformis mms + Thomas test bilateral, very tight quads  POSTURE: rounded shoulders, forward head, and decreased lumbar lordosis, slouched sitting posture  PALPATION: Tight with mild tenderness , scar is well healed  LUMBAR ROM:   AROM eval 12/08/23 12/15/23  Flexion Decreased 75%  Limited  25& Limited 25%  Extension Decreased 75%  Limited 50% Limited 50%  Right lateral flexion Decreased 75% Limited 25% WFL  Left lateral flexion Decreased 75% Limited 25% WFL  Right rotation  WFL   Left rotation  WFL    (Blank rows = not tested)  LOWER EXTREMITY ROM:     Stiff hips and HS  LOWER EXTREMITY MMT:   Right hip 4-/5, left hip 4+, knees 5-, ankles 4+  LUMBAR SPECIAL TESTS:  Straight leg raise test: Positive and Slump test: Negative    FUNCTIONAL TESTS:  Timed up and go (TUG): 10 seconds Patient does have right shoulder pain, I did a quick assessment of this, he has very weak RC bilaterally, ER was 3+/5.  He has right shoulder ROM that is limited with ER, flexion and abduction, negative impingement test, + empty can and + Yergason's test.  Again very poor posture may influence the strain on the right biceps GAIT: Distance walked: 100 feet Assistive device utilized:  None Level of assistance: Complete Independence Comments: forward flexed posture  TODAY'S TREATMENT:                                                                                                                              DATE:   12/14/22 Bike L3.5 x6 min Shoulder ER red 2x10 Horiz Abd blue 2x10 Oblqs 15lb x10 each Shoulder Ext 15lb 3x10   S2S holding 20lb KB 2x12  HS, piriformis,  single K2x, Double K2C, glute stretch, ITB Ionto patch 1mL dex R biceps tendon  12/11/23 Nustep level 6 x 6 minutes Rows & Lats 55lb 2x10  HS curls 45lb 2x15 Leg Ext 20lb 2x15  Bird dogs 2x5 each  S2S OHP 10lb Dumbbell 2x10 SL bridges 2x10  Dead bug LE only 3x5 each  HS, piriformis, single K2x, Double K2C, glute stretch, ITB  12/08/23 Nustep level 5 x 6 minutes CHECKED GOALS Seated Rows & Lats 45lb 2x12 Leg press 60# 3x10 Slant board stretch 20# AR press S2S holding 20lb 2x10 Dead bug LE only 3x5 each  LE on Pball, bridges, Obg, K2C HS, piriformis, single K2x, Double K2C, glute stretch  12/04/23 Nustep level 6 x 5 minutes S2S on airex w/ yellow ball chest press 2x10  Horix abd green 2x12  W backs  Leg press 60# 2x15 Shoulder Ext 15lb 3x10  Standing Rows 20lb 3x10 Nustep level 5 x 6 minutesHS curls 45lb 2x15 Leg Ext 15lb 2x15  Bilat SLR 2x10 LE on Pball bridges Passive stretch LE's   PATIENT EDUCATION:  Education details: HEP/POC Person educated: Patient Education method: Programmer, multimedia, Demonstration, Actor cues, and Verbal cues Education comprehension: verbalized understanding  HOME EXERCISE PROGRAM: Access Code: [email or web address] URL: [email or web address] Date: 11/16/2023 Prepared by: Debroah Baller  Exercises - Supine Hamstring Stretch with Strap  - 1 x daily - 7 x weekly - 3 sets - 10 reps - Supine Bridge  - 1 x daily - 7 x weekly - 3 sets - 10 reps - Supine Figure 4 Piriformis Stretch  - 1 x daily - 7 x weekly - 3 sets - 10 reps  ASSESSMENT:  CLINICAL  IMPRESSION: Patient is a 68  y.o. male who was seen today for physical therapy treatment for s/p lumbar laminectomy with septic arthritis on 08/30/23 past MRI's show significant stenosis.  He has been on antibiotics and overall just reports feeling stiff and weakness and unable to do what he was doing prior, like golf, pickle ball, and yard work. We continue to add to his overall strength and function with increased core and extremity exercises. Pt does require cues for posture and core activation during session. Some core weakness present with small rotations, cues needed to turn shoulder an not arms. Increase resistance tolerated on leg press, but cues needed for eccentric control.. . Tightness remains in both LE's but improving   OBJECTIVE IMPAIRMENTS: Abnormal gait, cardiopulmonary status limiting activity, decreased activity tolerance, decreased balance, decreased endurance, decreased mobility, difficulty walking, decreased ROM, decreased strength, increased muscle spasms, impaired flexibility, improper body mechanics, postural dysfunction, and pain.   REHAB POTENTIAL: Good  CLINICAL DECISION MAKING: Stable/uncomplicated  EVALUATION COMPLEXITY: Low   GOALS: Goals reviewed with patient? Yes  SHORT TERM GOALS: Target date: 11/28/23  Independent with initial HEP Goal status: Met 11/20/23  LONG TERM GOALS: Target date: 01/11/24  Independent with advanced HEP Goal status: INITIAL  2.  Decrease pain 50% Goal status: Met 12/08/23  3.  Increase lumbar ROM 25% Goal status: Met 12/07/22  4.  Increase SLR to 70 degrees Goal status:progressing 11/27/23  5.  Be able to play a round of golf without difficulty Goal status: progressing 11/27/23  PLAN:  PT FREQUENCY: 2x/week  PT DURATION: 12 weeks  PLANNED INTERVENTIONS: Therapeutic exercises, Therapeutic activity, Neuromuscular re-education, Balance training, Gait training, Patient/Family education, Self Care, Joint mobilization, Joint  manipulation, Dry Needling, Electrical stimulation, Spinal manipulation, Spinal mobilization, Cryotherapy, Moist heat, Traction, Ultrasound, and Manual therapy.  PLAN FOR  NEXT SESSION: Continue to progress as tolerated  Grayce Sessions, PTA 12/15/2023, 10:53 AM

## 2023-12-18 ENCOUNTER — Ambulatory Visit: Payer: Medicare Other | Admitting: Physical Therapy

## 2023-12-18 ENCOUNTER — Encounter: Payer: Self-pay | Admitting: Physical Therapy

## 2023-12-18 DIAGNOSIS — M5459 Other low back pain: Secondary | ICD-10-CM

## 2023-12-18 DIAGNOSIS — M5416 Radiculopathy, lumbar region: Secondary | ICD-10-CM | POA: Diagnosis not present

## 2023-12-18 DIAGNOSIS — M6281 Muscle weakness (generalized): Secondary | ICD-10-CM | POA: Diagnosis not present

## 2023-12-18 NOTE — Therapy (Signed)
OUTPATIENT PHYSICAL THERAPY THORACOLUMBAR TREATMENT Progress Note Reporting Period 11/09/24 to 12/18/23  See note below for Objective Data and Assessment of Progress/Goals.      Patient Name: Dan Sanchez MRN: [id number] DOB:[birth year], 68 y.o., male Today's Date: 12/18/2023  END OF SESSION:  PT End of Session - 12/18/23 1054     Visit Number 10    Date for PT Re-Evaluation 01/11/24    PT Start Time 1055    PT Stop Time 1140    PT Time Calculation (min) 45 min    Activity Tolerance Patient tolerated treatment well    Behavior During Therapy [facility] for tasks assessed/performed              Past Medical History:  Diagnosis Date   Allergy    CAD (coronary artery disease)    mild non-obstructive   GERD (gastroesophageal reflux disease)    Hyperlipidemia    S/P right rotator cuff repair    Sinusitis, bacterial    Urinary incontinence    Past Surgical History:  Procedure Laterality Date   COLONOSCOPY  04/08/2009   LEFT HEART CATHETERIZATION WITH CORONARY ANGIOGRAM N/A 08/13/2014   Procedure: LEFT HEART CATHETERIZATION WITH CORONARY ANGIOGRAM;  Surgeon: Kathleene Hazel, MD;  Location: [facility] CATH LAB;  Service: Cardiovascular;  Laterality: N/A;   LUMBAR LAMINECTOMY FOR EPIDURAL ABSCESS N/A 08/30/2023   Procedure: LUMBAR LAMINECTOMY LUMBAR THREE-LUMBAR FOUR, LUMBAR FOUR-LUMBAR FIVE WITH EVACUATION OF EPIDURAL ABSCESS;  Surgeon: Jadene Pierini, MD;  Location: [facility] OR;  Service: Neurosurgery;  Laterality: N/A;   SHOULDER ARTHROSCOPY WITH ROTATOR CUFF REPAIR Right 10/26/2017   Procedure: RIGHT SHOULDER ARTHROSCOPY WITH ROTATOR CUFF REPAIR AND DEBRIDEMENT;  Surgeon: Eugenia Mcalpine, MD;  Location: [facility];  Service: Orthopedics;  Laterality: Right;   SHOULDER SURGERY     Right shoulder    TEE WITHOUT CARDIOVERSION N/A 09/04/2023   Procedure: TRANSESOPHAGEAL ECHOCARDIOGRAM;  Surgeon: Pricilla Riffle, MD;  Location: [facility] INVASIVE CV LAB;  Service:  Cardiovascular;  Laterality: N/A;   Patient Active Problem List   Diagnosis Date Noted   Epidural abscess 11/14/2023   MSSA bacteremia 08/31/2023   Septic arthritis of lumbar spine (HCC) 08/29/2023   Normocytic anemia 08/29/2023   Elevated LFTs 08/29/2023   Osteoarthritis of knee 03/01/2023   Impingement syndrome of right shoulder region 02/27/2023   Coronary artery disease involving native coronary artery of native heart without angina pectoris 12/15/2022   Spinal stenosis of lumbar region with neurogenic claudication 03/23/2021   Lumbar radiculopathy 02/04/2021   HTN (hypertension) 03/29/2019   Overweight (BMI 25.0-29.9) 03/29/2019   Nasal septal deviation 02/01/2019   Nasal turbinate hypertrophy 02/01/2019   S/P right rotator cuff repair 10/26/2017   Contact dermatitis 08/27/2015   HSV-1 (herpes simplex virus 1) infection 08/27/2015   Bee sting allergy 09/10/2014   Chest pain 08/11/2014   Allergic rhinitis 08/10/2014   Allergic conjunctivitis 02/20/2014   Insomnia 02/03/2014   GERD (gastroesophageal reflux disease) 02/03/2014   General medical examination 03/16/2012   Hyperlipidemia 12/24/2007   ERECTILE DYSFUNCTION 12/24/2007    PCP: Beverely Low, MD  REFERRING PROVIDER: Shon Baton, MD  REFERRING DIAG: lumbar radiculopathy  Rationale for Evaluation and Treatment: Rehabilitation  THERAPY DIAG:  Muscle weakness (generalized)  Other low back pain  Radiculopathy, lumbar region  ONSET DATE: 08/30/23  SUBJECTIVE:  SUBJECTIVE STATEMENT: "GOOD"  PERTINENT HISTORY:  Lumbar laminectomy L3-5 08/30/23  PAIN:  Are you having pain? Yes: NPRS scale: 0/10 Pain location: just feel really stiff and a little sore Pain description: stiff and sore Aggravating factors: just get sore with  activity Relieving factors: stretching,  PRECAUTIONS: None  RED FLAGS: None   WEIGHT BEARING RESTRICTIONS: No  FALLS:  Has patient fallen in last 6 months? Yes, when legs were giving out  LIVING ENVIRONMENT: Lives with: lives with their family and lives with their spouse Lives in: House/apartment Stairs: Yes: Internal: 12 steps; bilateral but cannot reach both Has following equipment at home: None  OCCUPATION: sitting in sales, some driving  PLOF: Independent and yardwork, some pickleball 1 every month, golf every week  PATIENT GOALS: have less pain, move better, get my stamina and strength back  NEXT MD VISIT: none scheduled  OBJECTIVE:   DIAGNOSTIC FINDINGS:  Older X-ray with significant stenosis  COGNITION: Overall cognitive status: Within functional limits for tasks assessed     SENSATION: WFL  MUSCLE LENGTH: Very tight HS 55 degree SLR, right leg feels "more than left" Very tight ITB and piriformis mms + Thomas test bilateral, very tight quads  POSTURE: rounded shoulders, forward head, and decreased lumbar lordosis, slouched sitting posture  PALPATION: Tight with mild tenderness , scar is well healed  LUMBAR ROM:   AROM eval 12/08/23 12/15/23  Flexion Decreased 75%  Limited  25& Limited 25%  Extension Decreased 75%  Limited 50% Limited 50%  Right lateral flexion Decreased 75% Limited 25% WFL  Left lateral flexion Decreased 75% Limited 25% WFL  Right rotation  WFL   Left rotation  WFL    (Blank rows = not tested)  LOWER EXTREMITY ROM:     Stiff hips and HS  LOWER EXTREMITY MMT:   Right hip 4-/5, left hip 4+, knees 5-, ankles 4+  LUMBAR SPECIAL TESTS:  Straight leg raise test: Positive and Slump test: Negative    FUNCTIONAL TESTS:  Timed up and go (TUG): 10 seconds Patient does have right shoulder pain, I did a quick assessment of this, he has very weak RC bilaterally, ER was 3+/5.  He has right shoulder ROM that is limited with ER, flexion and  abduction, negative impingement test, + empty can and + Yergason's test.  Again very poor posture may influence the strain on the right biceps GAIT: Distance walked: 100 feet Assistive device utilized: None Level of assistance: Complete Independence Comments: forward flexed posture  TODAY'S TREATMENT:                                                                                                                              DATE:   12/18/23 Bike L4 x6 min Seated Rows & Lats 45lb 2x15 Standing Rows 25lb 2x10  Leg press 80lb 2x10 Slant board calf stretch Horiz abd blue 2x10 20lb farmers carry  LE on Pball bridges, oblq, K2C HS, piriformis,  single K2x, Double K2C, glute stretch, ITB  12/14/22 Bike L3.5 x6 min Shoulder ER red 2x10 Horiz Abd blue 2x10 Oblqs 15lb x10 each Shoulder Ext 15lb 3x10   S2S holding 20lb KB 2x12  HS, piriformis, single K2x, Double K2C, glute stretch, ITB Ionto patch 1mL dex R biceps tendon  12/11/23 Nustep level 6 x 6 minutes Rows & Lats 55lb 2x10  HS curls 45lb 2x15 Leg Ext 20lb 2x15  Bird dogs 2x5 each  S2S OHP 10lb Dumbbell 2x10 SL bridges 2x10  Dead bug LE only 3x5 each  HS, piriformis, single K2x, Double K2C, glute stretch, ITB  12/08/23 Nustep level 5 x 6 minutes CHECKED GOALS Seated Rows & Lats 45lb 2x12 Leg press 60# 3x10 Slant board stretch 20# AR press S2S holding 20lb 2x10 Dead bug LE only 3x5 each  LE on Pball, bridges, Obg, K2C HS, piriformis, single K2x, Double K2C, glute stretch     PATIENT EDUCATION:  Education details: HEP/POC Person educated: Patient Education method: Programmer, multimedia, Demonstration, Actor cues, and Verbal cues Education comprehension: verbalized understanding  HOME EXERCISE PROGRAM: Access Code: [email or web address] URL: [email or web address] Date: 11/16/2023 Prepared by: Debroah Baller  Exercises - Supine Hamstring Stretch with Strap  - 1 x daily - 7 x weekly - 3 sets - 10 reps - Supine Bridge   - 1 x daily - 7 x weekly - 3 sets - 10 reps - Supine Figure 4 Piriformis Stretch  - 1 x daily - 7 x weekly - 3 sets - 10 reps  ASSESSMENT:  CLINICAL IMPRESSION: Patient is a 68  y.o. male who was seen today for physical therapy treatment for s/p lumbar laminectomy with septic arthritis on 08/30/23 past MRI's show significant stenosis.  He has been on antibiotics and overall just reports feeling stiff and weakness and unable to do what he was doing prior, like golf, pickle ball, and yard work. ROM checked last session. We continue to add to his overall strength and function with increased core and extremity exercises.  He has improved increasing his lumbar flexibility and over all posture. Pt does require cues for posture and core activation with standing rows.  Again increase resistance tolerated on leg press, but cues needed for eccentric control. Tightness remains in both LE's but improving   OBJECTIVE IMPAIRMENTS: Abnormal gait, cardiopulmonary status limiting activity, decreased activity tolerance, decreased balance, decreased endurance, decreased mobility, difficulty walking, decreased ROM, decreased strength, increased muscle spasms, impaired flexibility, improper body mechanics, postural dysfunction, and pain.   REHAB POTENTIAL: Good  CLINICAL DECISION MAKING: Stable/uncomplicated  EVALUATION COMPLEXITY: Low   GOALS: Goals reviewed with patient? Yes  SHORT TERM GOALS: Target date: 11/28/23  Independent with initial HEP Goal status: Met 11/20/23  LONG TERM GOALS: Target date: 01/11/24  Independent with advanced HEP Goal status: INITIAL  2.  Decrease pain 50% Goal status: Met 12/08/23  3.  Increase lumbar ROM 25% Goal status: Met 12/07/22  4.  Increase SLR to 70 degrees Goal status:progressing 11/27/23, RLE 60 deg, LLE 55 deg 12/18/23  5.  Be able to play a round of golf without difficulty Goal status: progressing 12/18/23, not played due to weather  PLAN:  PT FREQUENCY:  2x/week  PT DURATION: 12 weeks  PLANNED INTERVENTIONS: Therapeutic exercises, Therapeutic activity, Neuromuscular re-education, Balance training, Gait training, Patient/Family education, Self Care, Joint mobilization, Joint manipulation, Dry Needling, Electrical stimulation, Spinal manipulation, Spinal mobilization, Cryotherapy, Moist heat, Traction, Ultrasound, and Manual therapy.  PLAN FOR NEXT SESSION: Continue to progress as tolerated  Grayce Sessions, PTA 12/18/2023, 10:55 AM

## 2023-12-19 ENCOUNTER — Other Ambulatory Visit (HOSPITAL_BASED_OUTPATIENT_CLINIC_OR_DEPARTMENT_OTHER): Payer: Self-pay

## 2023-12-19 ENCOUNTER — Other Ambulatory Visit: Payer: Self-pay

## 2023-12-19 DIAGNOSIS — R972 Elevated prostate specific antigen [PSA]: Secondary | ICD-10-CM

## 2023-12-21 ENCOUNTER — Telehealth: Payer: Self-pay | Admitting: Cardiovascular Disease

## 2023-12-21 NOTE — Telephone Encounter (Signed)
PT came in this morning asking about his meds would like for you to give him a call he will be out of meds soon and needs to know if they can be called in or dose he need to be seen.

## 2023-12-22 ENCOUNTER — Ambulatory Visit: Payer: Medicare Other | Admitting: Podiatry

## 2023-12-22 ENCOUNTER — Ambulatory Visit: Payer: Medicare Other | Admitting: Physical Therapy

## 2023-12-22 ENCOUNTER — Encounter: Payer: Self-pay | Admitting: Physical Therapy

## 2023-12-22 ENCOUNTER — Encounter: Payer: Self-pay | Admitting: Podiatry

## 2023-12-22 ENCOUNTER — Other Ambulatory Visit (HOSPITAL_BASED_OUTPATIENT_CLINIC_OR_DEPARTMENT_OTHER): Payer: Self-pay

## 2023-12-22 DIAGNOSIS — G629 Polyneuropathy, unspecified: Secondary | ICD-10-CM | POA: Diagnosis not present

## 2023-12-22 DIAGNOSIS — M5459 Other low back pain: Secondary | ICD-10-CM | POA: Diagnosis not present

## 2023-12-22 DIAGNOSIS — M5416 Radiculopathy, lumbar region: Secondary | ICD-10-CM | POA: Diagnosis not present

## 2023-12-22 DIAGNOSIS — R600 Localized edema: Secondary | ICD-10-CM | POA: Diagnosis not present

## 2023-12-22 DIAGNOSIS — D3610 Benign neoplasm of peripheral nerves and autonomic nervous system, unspecified: Secondary | ICD-10-CM | POA: Diagnosis not present

## 2023-12-22 DIAGNOSIS — M6281 Muscle weakness (generalized): Secondary | ICD-10-CM | POA: Diagnosis not present

## 2023-12-22 MED ORDER — ROSUVASTATIN CALCIUM 40 MG PO TABS
40.0000 mg | ORAL_TABLET | Freq: Every day | ORAL | 0 refills | Status: DC
  Filled 2023-12-22: qty 90, 90d supply, fill #0

## 2023-12-22 MED ORDER — AMLODIPINE BESYLATE 10 MG PO TABS
10.0000 mg | ORAL_TABLET | Freq: Every day | ORAL | 0 refills | Status: DC
  Filled 2023-12-22: qty 90, 90d supply, fill #0

## 2023-12-22 NOTE — Therapy (Signed)
OUTPATIENT PHYSICAL THERAPY THORACOLUMBAR TREATMENT  Patient Name: Dan Sanchez MRN: [id number] DOB:Dec 03, 1955, 68 y.o., male Today's Date: 12/22/2023  END OF SESSION:  PT End of Session - 12/22/23 0803     Visit Number 11    Date for PT Re-Evaluation 01/11/24    PT Start Time 0800    PT Stop Time 0845    PT Time Calculation (min) 45 min    Activity Tolerance Patient tolerated treatment well    Behavior During Therapy [facility] for tasks assessed/performed              Past Medical History:  Diagnosis Date   Allergy    CAD (coronary artery disease)    mild non-obstructive   GERD (gastroesophageal reflux disease)    Hyperlipidemia    S/P right rotator cuff repair    Sinusitis, bacterial    Urinary incontinence    Past Surgical History:  Procedure Laterality Date   COLONOSCOPY  04/08/2009   LEFT HEART CATHETERIZATION WITH CORONARY ANGIOGRAM N/A 08/13/2014   Procedure: LEFT HEART CATHETERIZATION WITH CORONARY ANGIOGRAM;  Surgeon: Kathleene Hazel, MD;  Location: [facility] CATH LAB;  Service: Cardiovascular;  Laterality: N/A;   LUMBAR LAMINECTOMY FOR EPIDURAL ABSCESS N/A 08/30/2023   Procedure: LUMBAR LAMINECTOMY LUMBAR THREE-LUMBAR FOUR, LUMBAR FOUR-LUMBAR FIVE WITH EVACUATION OF EPIDURAL ABSCESS;  Surgeon: Jadene Pierini, MD;  Location: [facility] OR;  Service: Neurosurgery;  Laterality: N/A;   SHOULDER ARTHROSCOPY WITH ROTATOR CUFF REPAIR Right 10/26/2017   Procedure: RIGHT SHOULDER ARTHROSCOPY WITH ROTATOR CUFF REPAIR AND DEBRIDEMENT;  Surgeon: Eugenia Mcalpine, MD;  Location: [facility];  Service: Orthopedics;  Laterality: Right;   SHOULDER SURGERY     Right shoulder    TEE WITHOUT CARDIOVERSION N/A 09/04/2023   Procedure: TRANSESOPHAGEAL ECHOCARDIOGRAM;  Surgeon: Pricilla Riffle, MD;  Location: [facility] INVASIVE CV LAB;  Service: Cardiovascular;  Laterality: N/A;   Patient Active Problem List   Diagnosis Date Noted   Epidural abscess 11/14/2023   MSSA bacteremia  08/31/2023   Septic arthritis of lumbar spine (HCC) 08/29/2023   Normocytic anemia 08/29/2023   Elevated LFTs 08/29/2023   Osteoarthritis of knee 03/01/2023   Impingement syndrome of right shoulder region 02/27/2023   Coronary artery disease involving native coronary artery of native heart without angina pectoris 12/15/2022   Spinal stenosis of lumbar region with neurogenic claudication 03/23/2021   Lumbar radiculopathy 02/04/2021   HTN (hypertension) 03/29/2019   Overweight (BMI 25.0-29.9) 03/29/2019   Nasal septal deviation 02/01/2019   Nasal turbinate hypertrophy 02/01/2019   S/P right rotator cuff repair 10/26/2017   Contact dermatitis 08/27/2015   HSV-1 (herpes simplex virus 1) infection 08/27/2015   Bee sting allergy 09/10/2014   Chest pain 08/11/2014   Allergic rhinitis 08/10/2014   Allergic conjunctivitis 02/20/2014   Insomnia 02/03/2014   GERD (gastroesophageal reflux disease) 02/03/2014   General medical examination 03/16/2012   Hyperlipidemia 12/24/2007   ERECTILE DYSFUNCTION 12/24/2007    PCP: Beverely Low, MD  REFERRING PROVIDER: Shon Baton, MD  REFERRING DIAG: lumbar radiculopathy  Rationale for Evaluation and Treatment: Rehabilitation  THERAPY DIAG:  Muscle weakness (generalized)  Other low back pain  Radiculopathy, lumbar region  ONSET DATE: 08/30/23  SUBJECTIVE:  SUBJECTIVE STATEMENT: "We good"  PERTINENT HISTORY:  Lumbar laminectomy L3-5 08/30/23  PAIN:  Are you having pain? Yes: NPRS scale: 0/10 Pain location: just feel really stiff and a little sore Pain description: stiff and sore Aggravating factors: just get sore with activity Relieving factors: stretching,  PRECAUTIONS: None  RED FLAGS: None   WEIGHT BEARING RESTRICTIONS: No  FALLS:  Has patient fallen in  last 6 months? Yes, when legs were giving out  LIVING ENVIRONMENT: Lives with: lives with their family and lives with their spouse Lives in: House/apartment Stairs: Yes: Internal: 12 steps; bilateral but cannot reach both Has following equipment at home: None  OCCUPATION: sitting in sales, some driving  PLOF: Independent and yardwork, some pickleball 1 every month, golf every week  PATIENT GOALS: have less pain, move better, get my stamina and strength back  NEXT MD VISIT: none scheduled  OBJECTIVE:   DIAGNOSTIC FINDINGS:  Older X-ray with significant stenosis  COGNITION: Overall cognitive status: Within functional limits for tasks assessed     SENSATION: WFL  MUSCLE LENGTH: Very tight HS 55 degree SLR, right leg feels "more than left" Very tight ITB and piriformis mms + Thomas test bilateral, very tight quads  POSTURE: rounded shoulders, forward head, and decreased lumbar lordosis, slouched sitting posture  PALPATION: Tight with mild tenderness , scar is well healed  LUMBAR ROM:   AROM eval 12/08/23 12/15/23  Flexion Decreased 75%  Limited  25& Limited 25%  Extension Decreased 75%  Limited 50% Limited 50%  Right lateral flexion Decreased 75% Limited 25% WFL  Left lateral flexion Decreased 75% Limited 25% WFL  Right rotation  WFL   Left rotation  WFL    (Blank rows = not tested)  LOWER EXTREMITY ROM:     Stiff hips and HS  LOWER EXTREMITY MMT:   Right hip 4-/5, left hip 4+, knees 5-, ankles 4+  LUMBAR SPECIAL TESTS:  Straight leg raise test: Positive and Slump test: Negative    FUNCTIONAL TESTS:  Timed up and go (TUG): 10 seconds Patient does have right shoulder pain, I did a quick assessment of this, he has very weak RC bilaterally, ER was 3+/5.  He has right shoulder ROM that is limited with ER, flexion and abduction, negative impingement test, + empty can and + Yergason's test.  Again very poor posture may influence the strain on the right  biceps GAIT: Distance walked: 100 feet Assistive device utilized: None Level of assistance: Complete Independence Comments: forward flexed posture  TODAY'S TREATMENT:                                                                                                                              DATE:   12/22/23 NuStep L 5 x 6 min S2S OHP 10lb 2x12 Standing Rows 20lb 2x15 Leg press 80lb 2x12 Horiz shoulder abd blue 2x12 Cross body rotation w/ yellow wball 2x10 Seated Rows 55lb 3x10 Lat pulls 55lb 2x10  LE on Pball bridges, oblq, K2C HS, piriformis, single K2x, Double K2C, glute stretch, ITB  12/18/23 Bike L4 x6 min Seated Rows & Lats 45lb 2x15 Standing Rows 25lb 2x10  Leg press 80lb 2x10 Slant board calf stretch Horiz abd blue 2x10 20lb farmers carry  LE on Pball bridges, oblq, K2C HS, piriformis, single K2x, Double K2C, glute stretch, ITB  12/14/22 Bike L3.5 x6 min Shoulder ER red 2x10 Horiz Abd blue 2x10 Oblqs 15lb x10 each Shoulder Ext 15lb 3x10   S2S holding 20lb KB 2x12  HS, piriformis, single K2x, Double K2C, glute stretch, ITB Ionto patch 1mL dex R biceps tendon  12/11/23 Nustep level 6 x 6 minutes Rows & Lats 55lb 2x10  HS curls 45lb 2x15 Leg Ext 20lb 2x15  Bird dogs 2x5 each  S2S OHP 10lb Dumbbell 2x10 SL bridges 2x10  Dead bug LE only 3x5 each  HS, piriformis, single K2x, Double K2C, glute stretch, ITB  12/08/23 Nustep level 5 x 6 minutes CHECKED GOALS Seated Rows & Lats 45lb 2x12 Leg press 60# 3x10 Slant board stretch 20# AR press S2S holding 20lb 2x10 Dead bug LE only 3x5 each  LE on Pball, bridges, Obg, K2C HS, piriformis, single K2x, Double K2C, glute stretch     PATIENT EDUCATION:  Education details: HEP/POC Person educated: Patient Education method: Programmer, multimedia, Demonstration, Actor cues, and Verbal cues Education comprehension: verbalized understanding  HOME EXERCISE PROGRAM: Access Code: [email or web address] URL:  [email or web address] Date: 11/16/2023 Prepared by: Debroah Baller  Exercises - Supine Hamstring Stretch with Strap  - 1 x daily - 7 x weekly - 3 sets - 10 reps - Supine Bridge  - 1 x daily - 7 x weekly - 3 sets - 10 reps - Supine Figure 4 Piriformis Stretch  - 1 x daily - 7 x weekly - 3 sets - 10 reps  ASSESSMENT:  CLINICAL IMPRESSION: Patient is a 68  y.o. male who was seen today for physical therapy treatment for s/p lumbar laminectomy with septic arthritis on 08/30/23 past MRI's show significant stenosis.  He has been on antibiotics and overall just reports feeling stiff and weakness and unable to do what he was doing prior, like golf, pickle ball, and yard work. ROM checked last session. We continue to add to his overall strength and function with increased core and extremity exercises. Increase resistance tolerated with seated rows and lats. Cues needed ti hole contraction with AR press. Cues to prevent posterior leaning and for core activation with standing rows.   Tightness remains in both LE's but improving   OBJECTIVE IMPAIRMENTS: Abnormal gait, cardiopulmonary status limiting activity, decreased activity tolerance, decreased balance, decreased endurance, decreased mobility, difficulty walking, decreased ROM, decreased strength, increased muscle spasms, impaired flexibility, improper body mechanics, postural dysfunction, and pain.   REHAB POTENTIAL: Good  CLINICAL DECISION MAKING: Stable/uncomplicated  EVALUATION COMPLEXITY: Low   GOALS: Goals reviewed with patient? Yes  SHORT TERM GOALS: Target date: 11/28/23  Independent with initial HEP Goal status: Met 11/20/23  LONG TERM GOALS: Target date: 01/11/24  Independent with advanced HEP Goal status: INITIAL  2.  Decrease pain 50% Goal status: Met 12/08/23  3.  Increase lumbar ROM 25% Goal status: Met 12/07/22  4.  Increase SLR to 70 degrees Goal status:progressing 11/27/23, RLE 60 deg, LLE 55 deg  12/18/23  5.  Be able to play a round of golf without difficulty Goal status: progressing 12/18/23, not played due to weather  PLAN:  PT FREQUENCY: 2x/week  PT DURATION: 12  weeks  PLANNED INTERVENTIONS: Therapeutic exercises, Therapeutic activity, Neuromuscular re-education, Balance training, Gait training, Patient/Family education, Self Care, Joint mobilization, Joint manipulation, Dry Needling, Electrical stimulation, Spinal manipulation, Spinal mobilization, Cryotherapy, Moist heat, Traction, Ultrasound, and Manual therapy.  PLAN FOR NEXT SESSION: Continue to progress as tolerated  Grayce Sessions, PTA 12/22/2023, 8:03 AM

## 2023-12-22 NOTE — Telephone Encounter (Signed)
Returned call to patient.  He voices frustration that he came to see [facility] last year and the only thing they did was an EKG.  His PCP said "Dan Sanchez I can do that".  Wanted to know if he needs to continue to be seen in cardiology.  He reports he is feeling well and not having any concerns.  Wishes he could see Dr. [name] if he has to continue to come to cardiology.  In addition, our office sent him a 30 day supply of medicine and adv to call for an appointment and no availablity with his provider.    I refilled the medications for 90 days and scheduled him with Dr. [name] on 2/24.  He will discuss this further at that time.

## 2023-12-25 NOTE — Progress Notes (Signed)
Subjective:   Patient ID: Dan Sanchez, male   DOB: 68 y.o.   MRN: [id number]   HPI Patient presents stating he feels like he is getting more numbness in his left foot and also feels some swelling around his ankle and foot.  Does not remember specific injury states it is bothersome with certain activities and shoe gear but more just the feeling and it is more towards his midfoot and ankle he was concerned about that.  Patient does not smoke likes to be active   ROS      Objective:  Physical Exam  Neurovascular status intact muscle strength adequate mild reduction of sharp dull vibratory left with mild edema +1 pitting into the midfoot and ankle with negative Denna Haggard' sign noted.  Patient is found to have good digital perfusion well-oriented x 3     Assessment:  Probability that a lot of this is related to swelling with the possibility of low-grade neuropathy and also neuroma forefoot left     Plan:  H&P discussed all conditions did dispense ankle compression stocking discussed injection treatment will get a hold off on this and hopefully this is just a neuropraxia within the midfoot and will get better gradually  X-rays were negative for signs of any kind of bony issue or stress fracture or arthritis

## 2024-01-01 ENCOUNTER — Other Ambulatory Visit: Payer: Medicare Other

## 2024-01-01 ENCOUNTER — Ambulatory Visit: Payer: Medicare Other | Attending: Orthopedic Surgery | Admitting: Physical Therapy

## 2024-01-01 ENCOUNTER — Encounter: Payer: Self-pay | Admitting: Physical Therapy

## 2024-01-01 DIAGNOSIS — M6281 Muscle weakness (generalized): Secondary | ICD-10-CM | POA: Diagnosis not present

## 2024-01-01 DIAGNOSIS — R972 Elevated prostate specific antigen [PSA]: Secondary | ICD-10-CM

## 2024-01-01 DIAGNOSIS — M5459 Other low back pain: Secondary | ICD-10-CM

## 2024-01-01 DIAGNOSIS — M5416 Radiculopathy, lumbar region: Secondary | ICD-10-CM | POA: Diagnosis not present

## 2024-01-01 NOTE — Therapy (Signed)
OUTPATIENT PHYSICAL THERAPY THORACOLUMBAR TREATMENT  Patient Name: Dan Sanchez MRN: [id number] DOB:1956/01/04, 68 y.o., male Today's Date: 01/01/2024  END OF SESSION:  PT End of Session - 01/01/24 1134     Visit Number 12    Date for PT Re-Evaluation 01/11/24    PT Start Time 1135    PT Stop Time 1215    PT Time Calculation (min) 40 min    Activity Tolerance Patient tolerated treatment well    Behavior During Therapy [facility] for tasks assessed/performed              Past Medical History:  Diagnosis Date   Allergy    CAD (coronary artery disease)    mild non-obstructive   GERD (gastroesophageal reflux disease)    Hyperlipidemia    S/P right rotator cuff repair    Sinusitis, bacterial    Urinary incontinence    Past Surgical History:  Procedure Laterality Date   COLONOSCOPY  04/08/2009   LEFT HEART CATHETERIZATION WITH CORONARY ANGIOGRAM N/A 08/13/2014   Procedure: LEFT HEART CATHETERIZATION WITH CORONARY ANGIOGRAM;  Surgeon: Kathleene Hazel, MD;  Location: [facility] CATH LAB;  Service: Cardiovascular;  Laterality: N/A;   LUMBAR LAMINECTOMY FOR EPIDURAL ABSCESS N/A 08/30/2023   Procedure: LUMBAR LAMINECTOMY LUMBAR THREE-LUMBAR FOUR, LUMBAR FOUR-LUMBAR FIVE WITH EVACUATION OF EPIDURAL ABSCESS;  Surgeon: Jadene Pierini, MD;  Location: [facility] OR;  Service: Neurosurgery;  Laterality: N/A;   SHOULDER ARTHROSCOPY WITH ROTATOR CUFF REPAIR Right 10/26/2017   Procedure: RIGHT SHOULDER ARTHROSCOPY WITH ROTATOR CUFF REPAIR AND DEBRIDEMENT;  Surgeon: Eugenia Mcalpine, MD;  Location: [facility];  Service: Orthopedics;  Laterality: Right;   SHOULDER SURGERY     Right shoulder    TEE WITHOUT CARDIOVERSION N/A 09/04/2023   Procedure: TRANSESOPHAGEAL ECHOCARDIOGRAM;  Surgeon: Pricilla Riffle, MD;  Location: [facility] INVASIVE CV LAB;  Service: Cardiovascular;  Laterality: N/A;   Patient Active Problem List   Diagnosis Date Noted   Epidural abscess 11/14/2023   MSSA bacteremia  08/31/2023   Septic arthritis of lumbar spine (HCC) 08/29/2023   Normocytic anemia 08/29/2023   Elevated LFTs 08/29/2023   Osteoarthritis of knee 03/01/2023   Impingement syndrome of right shoulder region 02/27/2023   Coronary artery disease involving native coronary artery of native heart without angina pectoris 12/15/2022   Spinal stenosis of lumbar region with neurogenic claudication 03/23/2021   Lumbar radiculopathy 02/04/2021   HTN (hypertension) 03/29/2019   Overweight (BMI 25.0-29.9) 03/29/2019   Nasal septal deviation 02/01/2019   Nasal turbinate hypertrophy 02/01/2019   S/P right rotator cuff repair 10/26/2017   Contact dermatitis 08/27/2015   HSV-1 (herpes simplex virus 1) infection 08/27/2015   Bee sting allergy 09/10/2014   Chest pain 08/11/2014   Allergic rhinitis 08/10/2014   Allergic conjunctivitis 02/20/2014   Insomnia 02/03/2014   GERD (gastroesophageal reflux disease) 02/03/2014   General medical examination 03/16/2012   Hyperlipidemia 12/24/2007   ERECTILE DYSFUNCTION 12/24/2007    PCP: Beverely Low, MD  REFERRING PROVIDER: Shon Baton, MD  REFERRING DIAG: lumbar radiculopathy  Rationale for Evaluation and Treatment: Rehabilitation  THERAPY DIAG:  Muscle weakness (generalized)  Other low back pain  Radiculopathy, lumbar region  ONSET DATE: 08/30/23  SUBJECTIVE:  SUBJECTIVE STATEMENT: "Good" Worked in the yard, raked leaves  PERTINENT HISTORY:  Lumbar laminectomy L3-5 08/30/23  PAIN:  Are you having pain? Yes: NPRS scale: 0/10 Pain location: just feel really stiff and a little sore Pain description: stiff and sore Aggravating factors: just get sore with activity Relieving factors: stretching,  PRECAUTIONS: None  RED FLAGS: None   WEIGHT BEARING RESTRICTIONS:  No  FALLS:  Has patient fallen in last 6 months? Yes, when legs were giving out  LIVING ENVIRONMENT: Lives with: lives with their family and lives with their spouse Lives in: House/apartment Stairs: Yes: Internal: 12 steps; bilateral but cannot reach both Has following equipment at home: None  OCCUPATION: sitting in sales, some driving  PLOF: Independent and yardwork, some pickleball 1 every month, golf every week  PATIENT GOALS: have less pain, move better, get my stamina and strength back  NEXT MD VISIT: none scheduled  OBJECTIVE:   DIAGNOSTIC FINDINGS:  Older X-ray with significant stenosis  COGNITION: Overall cognitive status: Within functional limits for tasks assessed     SENSATION: WFL  MUSCLE LENGTH: Very tight HS 55 degree SLR, right leg feels "more than left" Very tight ITB and piriformis mms + Thomas test bilateral, very tight quads  POSTURE: rounded shoulders, forward head, and decreased lumbar lordosis, slouched sitting posture  PALPATION: Tight with mild tenderness , scar is well healed  LUMBAR ROM:   AROM eval 12/08/23 12/15/23  Flexion Decreased 75%  Limited  25& Limited 25%  Extension Decreased 75%  Limited 50% Limited 50%  Right lateral flexion Decreased 75% Limited 25% WFL  Left lateral flexion Decreased 75% Limited 25% WFL  Right rotation  WFL   Left rotation  WFL    (Blank rows = not tested)  LOWER EXTREMITY ROM:     Stiff hips and HS  LOWER EXTREMITY MMT:   Right hip 4-/5, left hip 4+, knees 5-, ankles 4+  LUMBAR SPECIAL TESTS:  Straight leg raise test: Positive and Slump test: Negative    FUNCTIONAL TESTS:  Timed up and go (TUG): 10 seconds Patient does have right shoulder pain, I did a quick assessment of this, he has very weak RC bilaterally, ER was 3+/5.  He has right shoulder ROM that is limited with ER, flexion and abduction, negative impingement test, + empty can and + Yergason's test.  Again very poor posture may influence the  strain on the right biceps GAIT: Distance walked: 100 feet Assistive device utilized: None Level of assistance: Complete Independence Comments: forward flexed posture  TODAY'S TREATMENT:                                                                                                                              DATE:   01/01/24 NuStep L 5 x 6 min Shoulder Ext 10lb 2x10 Slant board Calf stretch 5x10'' Seated Rows 55lb 3x10 Lats 55lb 3x10 HS curls 45lb 2x10 Leg Ext 20lb 2x15  Bridge /w  March 2x10 AR pre 20lb standing on airex HS, piriformis, single K2x, Double K2C, glute stretch, ITB  To improve tissue elasticity   12/22/23 NuStep L 5 x 6 min S2S OHP 10lb 2x12 Standing Rows 20lb 2x15 Leg press 80lb 2x12 Horiz shoulder abd blue 2x12 Cross body rotation w/ yellow wball 2x10 Seated Rows 55lb 3x10 Lat pulls 55lb 2x10 LE on Pball bridges, oblq, K2C HS, piriformis, single K2x, Double K2C, glute stretch, ITB  12/18/23 Bike L4 x6 min Seated Rows & Lats 45lb 2x15 Standing Rows 25lb 2x10  Leg press 80lb 2x10 Slant board calf stretch Horiz abd blue 2x10 20lb farmers carry  LE on Pball bridges, oblq, K2C HS, piriformis, single K2x, Double K2C, glute stretch, ITB  12/14/22 Bike L3.5 x6 min Shoulder ER red 2x10 Horiz Abd blue 2x10 Oblqs 15lb x10 each Shoulder Ext 15lb 3x10   S2S holding 20lb KB 2x12  HS, piriformis, single K2x, Double K2C, glute stretch, ITB Ionto patch 1mL dex R biceps tendon  12/11/23 Nustep level 6 x 6 minutes Rows & Lats 55lb 2x10  HS curls 45lb 2x15 Leg Ext 20lb 2x15  Bird dogs 2x5 each  S2S OHP 10lb Dumbbell 2x10 SL bridges 2x10  Dead bug LE only 3x5 each  HS, piriformis, single K2x, Double K2C, glute stretch, ITB  12/08/23 Nustep level 5 x 6 minutes CHECKED GOALS Seated Rows & Lats 45lb 2x12 Leg press 60# 3x10 Slant board stretch 20# AR press S2S holding 20lb 2x10 Dead bug LE only 3x5 each  LE on Pball, bridges, Obg, K2C HS, piriformis,  single K2x, Double K2C, glute stretch     PATIENT EDUCATION:  Education details: HEP/POC Person educated: Patient Education method: Programmer, multimedia, Demonstration, Actor cues, and Verbal cues Education comprehension: verbalized understanding  HOME EXERCISE PROGRAM: Access Code: [email or web address] URL: [email or web address] Date: 11/16/2023 Prepared by: Debroah Baller  Exercises - Supine Hamstring Stretch with Strap  - 1 x daily - 7 x weekly - 3 sets - 10 reps - Supine Bridge  - 1 x daily - 7 x weekly - 3 sets - 10 reps - Supine Figure 4 Piriformis Stretch  - 1 x daily - 7 x weekly - 3 sets - 10 reps  ASSESSMENT:  CLINICAL IMPRESSION: Patient is a 68  y.o. male who was seen today for physical therapy treatment for s/p lumbar laminectomy with septic arthritis on 08/30/23 past MRI's show significant stenosis.  He has been on antibiotics and overall just reports feeling stiff and weakness and unable to do what he was doing prior, like golf, pickle ball, and yard work.  We continue to add to his overall strength and function with increased core and extremity exercises. Cue needed for core activatio with sated rows and bridges. Cues needed to hold contraction with AR press.  Tightness remains in both LE's but improving   OBJECTIVE IMPAIRMENTS: Abnormal gait, cardiopulmonary status limiting activity, decreased activity tolerance, decreased balance, decreased endurance, decreased mobility, difficulty walking, decreased ROM, decreased strength, increased muscle spasms, impaired flexibility, improper body mechanics, postural dysfunction, and pain.   REHAB POTENTIAL: Good  CLINICAL DECISION MAKING: Stable/uncomplicated   EVALUATION COMPLEXITY: Low   GOALS: Goals reviewed with patient? Yes  SHORT TERM GOALS: Target date: 11/28/23  Independent with initial HEP Goal status: Met 11/20/23  LONG TERM GOALS: Target date: 01/11/24  Independent with advanced HEP Goal status:  INITIAL  2.  Decrease pain 50% Goal status: Met 12/08/23  3.  Increase lumbar ROM 25% Goal status: Met 12/07/22  4.  Increase SLR to 70 degrees Goal status:progressing 11/27/23, RLE 60 deg, LLE 55 deg 12/18/23  5.  Be able to play a round of golf without difficulty Goal status: progressing 12/18/23, not played due to weather  PLAN:  PT FREQUENCY: 2x/week  PT DURATION: 12 weeks  PLANNED INTERVENTIONS: Therapeutic exercises, Therapeutic activity, Neuromuscular re-education, Balance training, Gait training, Patient/Family education, Self Care, Joint mobilization, Joint manipulation, Dry Needling, Electrical stimulation, Spinal manipulation, Spinal mobilization, Cryotherapy, Moist heat, Traction, Ultrasound, and Manual therapy.  PLAN FOR NEXT SESSION: Continue to progress as tolerated  Grayce Sessions, PTA 01/01/2024, 11:35 AM

## 2024-01-02 ENCOUNTER — Telehealth: Payer: Self-pay | Admitting: Urology

## 2024-01-02 ENCOUNTER — Encounter: Payer: Self-pay | Admitting: Urology

## 2024-01-02 LAB — PSA: Prostate Specific Ag, Serum: 2.2 ng/mL (ref 0.0–4.0)

## 2024-01-02 NOTE — Telephone Encounter (Signed)
Spoke to pt about r/s his appointment for 01/10/23 with Dr Margo Aye he stated the would call back.

## 2024-01-08 ENCOUNTER — Ambulatory Visit: Payer: Medicare Other | Admitting: Physical Therapy

## 2024-01-08 ENCOUNTER — Encounter: Payer: Self-pay | Admitting: Physical Therapy

## 2024-01-08 DIAGNOSIS — M5459 Other low back pain: Secondary | ICD-10-CM

## 2024-01-08 DIAGNOSIS — M6281 Muscle weakness (generalized): Secondary | ICD-10-CM | POA: Diagnosis not present

## 2024-01-08 DIAGNOSIS — M5416 Radiculopathy, lumbar region: Secondary | ICD-10-CM

## 2024-01-08 NOTE — Therapy (Signed)
 OUTPATIENT PHYSICAL THERAPY THORACOLUMBAR TREATMENT  Patient Name: Dan Sanchez MRN: [id number] DOB:1956/08/17, 68 y.o., male Today's Date: 01/08/2024  END OF SESSION:  PT End of Session - 01/08/24 1053     Visit Number 13    Date for PT Re-Evaluation 01/11/24    Authorization Type [employer]    PT Start Time 1053    PT Stop Time 1143    PT Time Calculation (min) 50 min    Activity Tolerance Patient tolerated treatment well    Behavior During Therapy WFL for tasks assessed/performed              Past Medical History:  Diagnosis Date   Allergy    CAD (coronary artery disease)    mild non-obstructive   GERD (gastroesophageal reflux disease)    Hyperlipidemia    S/P right rotator cuff repair    Sinusitis, bacterial    Urinary incontinence    Past Surgical History:  Procedure Laterality Date   COLONOSCOPY  04/08/2009   LEFT HEART CATHETERIZATION WITH CORONARY ANGIOGRAM N/A 08/13/2014   Procedure: LEFT HEART CATHETERIZATION WITH CORONARY ANGIOGRAM;  Surgeon: Odie Benne, MD;  Location: [facility] CATH LAB;  Service: Cardiovascular;  Laterality: N/A;   LUMBAR LAMINECTOMY FOR EPIDURAL ABSCESS N/A 08/30/2023   Procedure: LUMBAR LAMINECTOMY LUMBAR THREE-LUMBAR FOUR, LUMBAR FOUR-LUMBAR FIVE WITH EVACUATION OF EPIDURAL ABSCESS;  Surgeon: Cannon Champion, MD;  Location: [facility] OR;  Service: Neurosurgery;  Laterality: N/A;   SHOULDER ARTHROSCOPY WITH ROTATOR CUFF REPAIR Right 10/26/2017   Procedure: RIGHT SHOULDER ARTHROSCOPY WITH ROTATOR CUFF REPAIR AND DEBRIDEMENT;  Surgeon: Genevie Kerns, MD;  Location: [facility];  Service: Orthopedics;  Laterality: Right;   SHOULDER SURGERY     Right shoulder    TEE WITHOUT CARDIOVERSION N/A 09/04/2023   Procedure: TRANSESOPHAGEAL ECHOCARDIOGRAM;  Surgeon: Elmyra Haggard, MD;  Location: [facility] INVASIVE CV LAB;  Service: Cardiovascular;  Laterality: N/A;   Patient Active Problem List   Diagnosis Date Noted   Epidural abscess  11/14/2023   MSSA bacteremia 08/31/2023   Septic arthritis of lumbar spine (HCC) 08/29/2023   Normocytic anemia 08/29/2023   Elevated LFTs 08/29/2023   Osteoarthritis of knee 03/01/2023   Impingement syndrome of right shoulder region 02/27/2023   Coronary artery disease involving native coronary artery of native heart without angina pectoris 12/15/2022   Spinal stenosis of lumbar region with neurogenic claudication 03/23/2021   Lumbar radiculopathy 02/04/2021   HTN (hypertension) 03/29/2019   Overweight (BMI 25.0-29.9) 03/29/2019   Nasal septal deviation 02/01/2019   Nasal turbinate hypertrophy 02/01/2019   S/P right rotator cuff repair 10/26/2017   Contact dermatitis 08/27/2015   HSV-1 (herpes simplex virus 1) infection 08/27/2015   Bee sting allergy 09/10/2014   Chest pain 08/11/2014   Allergic rhinitis 08/10/2014   Allergic conjunctivitis 02/20/2014   Insomnia 02/03/2014   GERD (gastroesophageal reflux disease) 02/03/2014   General medical examination 03/16/2012   Hyperlipidemia 12/24/2007   ERECTILE DYSFUNCTION 12/24/2007    PCP: Paulla Bossier, MD  REFERRING PROVIDER: Vaughn Georges, MD  REFERRING DIAG: lumbar radiculopathy  Rationale for Evaluation and Treatment: Rehabilitation  THERAPY DIAG:  Muscle weakness (generalized)  Other low back pain  Radiculopathy, lumbar region  ONSET DATE: 08/30/23  SUBJECTIVE:  SUBJECTIVE STATEMENT: I am a little stiff and sore, did standing and walking at a boat show over the weekend  PERTINENT HISTORY:  Lumbar laminectomy L3-5 08/30/23  PAIN:  Are you having pain? Yes: NPRS scale: 0/10 Pain location: just feel really stiff and a little sore Pain description: stiff and sore Aggravating factors: just get sore with activity Relieving factors:  stretching,  PRECAUTIONS: None  RED FLAGS: None   WEIGHT BEARING RESTRICTIONS: No  FALLS:  Has patient fallen in last 6 months? Yes, when legs were giving out  LIVING ENVIRONMENT: Lives with: lives with their family and lives with their spouse Lives in: House/apartment Stairs: Yes: Internal: 12 steps; bilateral but cannot reach both Has following equipment at home: None  OCCUPATION: sitting in sales, some driving  PLOF: Independent and yardwork, some pickleball 1 every month, golf every week  PATIENT GOALS: have less pain, move better, get my stamina and strength back  NEXT MD VISIT: none scheduled  OBJECTIVE:   DIAGNOSTIC FINDINGS:  Older X-ray with significant stenosis  COGNITION: Overall cognitive status: Within functional limits for tasks assessed     SENSATION: WFL  MUSCLE LENGTH: Very tight HS 55 degree SLR, right leg feels "more than left" Very tight ITB and piriformis mms + Thomas test bilateral, very tight quads  POSTURE: rounded shoulders, forward head, and decreased lumbar lordosis, slouched sitting posture  PALPATION: Tight with mild tenderness , scar is well healed  LUMBAR ROM:   AROM eval 12/08/23 12/15/23  Flexion Decreased 75%  Limited  25& Limited 25%  Extension Decreased 75%  Limited 50% Limited 50%  Right lateral flexion Decreased 75% Limited 25% WFL  Left lateral flexion Decreased 75% Limited 25% WFL  Right rotation  WFL   Left rotation  WFL    (Blank rows = not tested)  LOWER EXTREMITY ROM:     Stiff hips and HS  LOWER EXTREMITY MMT:   Right hip 4-/5, left hip 4+, knees 5-, ankles 4+  LUMBAR SPECIAL TESTS:  Straight leg raise test: Positive and Slump test: Negative    FUNCTIONAL TESTS:  Timed up and go (TUG): 10 seconds Patient does have right shoulder pain, I did a quick assessment of this, he has very weak RC bilaterally, ER was 3+/5.  He has right shoulder ROM that is limited with ER, flexion and abduction, negative  impingement test, + empty can and + Yergason's test.  Again very poor posture may influence the strain on the right biceps GAIT: Distance walked: 100 feet Assistive device utilized: None Level of assistance: Complete Independence Comments: forward flexed posture  TODAY'S TREATMENT:                                                                                                                              DATE:   01/08/24 Bike level 5 x 5 minutes Tmill push 20 s x 3 Slant board stretch Seated row 55# 3x10 Lats 55# 3x10  AR press 20# 45# triceps cues for form and core Leg press 80# 3x10 Hip extension 20# 2x10 Hip abduction 15# 20# sit to stand 2 x10 Passive stretch HS, piriformis, Hip flexor and quads  01/01/24 NuStep L 5 x 6 min Shoulder Ext 10lb 2x10 Slant board Calf stretch 5x10'' Seated Rows 55lb 3x10 Lats 55lb 3x10 HS curls 45lb 2x10 Leg Ext 20lb 2x15  Bridge /w March 2x10 AR pre 20lb standing on airex HS, piriformis, single K2x, Double K2C, glute stretch, ITB  To improve tissue elasticity   12/22/23 NuStep L 5 x 6 min S2S OHP 10lb 2x12 Standing Rows 20lb 2x15 Leg press 80lb 2x12 Horiz shoulder abd blue 2x12 Cross body rotation w/ yellow wball 2x10 Seated Rows 55lb 3x10 Lat pulls 55lb 2x10 LE on Pball bridges, oblq, K2C HS, piriformis, single K2x, Double K2C, glute stretch, ITB  12/18/23 Bike L4 x6 min Seated Rows & Lats 45lb 2x15 Standing Rows 25lb 2x10  Leg press 80lb 2x10 Slant board calf stretch Horiz abd blue 2x10 20lb farmers carry  LE on Pball bridges, oblq, K2C HS, piriformis, single K2x, Double K2C, glute stretch, ITB  12/14/22 Bike L3.5 x6 min Shoulder ER red 2x10 Horiz Abd blue 2x10 Oblqs 15lb x10 each Shoulder Ext 15lb 3x10   S2S holding 20lb KB 2x12  HS, piriformis, single K2x, Double K2C, glute stretch, ITB Ionto patch 1mL dex R biceps tendon  12/11/23 Nustep level 6 x 6 minutes Rows & Lats 55lb 2x10  HS curls 45lb 2x15 Leg Ext 20lb  2x15  Bird dogs 2x5 each  S2S OHP 10lb Dumbbell 2x10 SL bridges 2x10  Dead bug LE only 3x5 each  HS, piriformis, single K2x, Double K2C, glute stretch, ITB  PATIENT EDUCATION:  Education details: HEP/POC Person educated: Patient Education method: Programmer, multimedia, Demonstration, Tactile cues, and Verbal cues Education comprehension: verbalized understanding  HOME EXERCISE PROGRAM: Access Code: [email or web address] URL: [email or web address] Date: 11/16/2023 Prepared by: Towanda Fret  Exercises - Supine Hamstring Stretch with Strap  - 1 x daily - 7 x weekly - 3 sets - 10 reps - Supine Bridge  - 1 x daily - 7 x weekly - 3 sets - 10 reps - Supine Figure 4 Piriformis Stretch  - 1 x daily - 7 x weekly - 3 sets - 10 reps  ASSESSMENT:  CLINICAL IMPRESSION: Patient is a 68  y.o. male who was seen today for physical therapy treatment for s/p lumbar laminectomy with septic arthritis on 08/30/23 past MRI's show significant stenosis.  He has been on antibiotics and overall just reports feeling stiff and weakness and unable to do what he was doing prior, like golf, pickle ball, and yard work.  I continue to add overall strength and conditioning and as we do this talk about him doing on his own in the future, with strength, core, posture and flexibility, we discussed the biggest issue is him doing flexibility at home, he really seems to benefit from the passive stretching that we do here.  I do feel that his posture is improving  OBJECTIVE IMPAIRMENTS: Abnormal gait, cardiopulmonary status limiting activity, decreased activity tolerance, decreased balance, decreased endurance, decreased mobility, difficulty walking, decreased ROM, decreased strength, increased muscle spasms, impaired flexibility, improper body mechanics, postural dysfunction, and pain.   REHAB POTENTIAL: Good  CLINICAL DECISION MAKING: Stable/uncomplicated   EVALUATION COMPLEXITY: Low   GOALS: Goals reviewed with patient?  Yes  SHORT TERM GOALS: Target date: 11/28/23  Independent with initial HEP Goal status: Met 11/20/23  LONG TERM GOALS: Target date: 01/11/24  Independent with advanced HEP Goal status: progressing 01/07/25  2.  Decrease pain 50% Goal status: Met 12/08/23  3.  Increase lumbar ROM 25% Goal status: Met 12/07/22  4.  Increase SLR to 70 degrees Goal status:progressing 11/27/23, RLE 60 deg, LLE 55 deg 12/18/23  5.  Be able to play a round of golf without difficulty Goal status: progressing 12/18/23, not played due to weather  PLAN:  PT FREQUENCY: 2x/week  PT DURATION: 12 weeks  PLANNED INTERVENTIONS: Therapeutic exercises, Therapeutic activity, Neuromuscular re-education, Balance training, Gait training, Patient/Family education, Self Care, Joint mobilization, Joint manipulation, Dry Needling, Electrical stimulation, Spinal manipulation, Spinal mobilization, Cryotherapy, Moist heat, Traction, Ultrasound, and Manual therapy.  PLAN FOR NEXT SESSION: Work on core and flexibility, may need to speak aobut posture and body mechanics for ADL's and lifting, his shoulder does limit some exercises  Render Marley W, PT 01/08/2024, 10:53 AM

## 2024-01-11 ENCOUNTER — Ambulatory Visit: Payer: Medicare Other | Admitting: Urology

## 2024-01-15 ENCOUNTER — Ambulatory Visit: Payer: Medicare Other | Admitting: Physical Therapy

## 2024-01-18 DIAGNOSIS — K080 Exfoliation of teeth due to systemic causes: Secondary | ICD-10-CM | POA: Diagnosis not present

## 2024-01-22 ENCOUNTER — Encounter: Payer: Self-pay | Admitting: Physical Therapy

## 2024-01-22 ENCOUNTER — Ambulatory Visit: Payer: Medicare Other | Attending: Cardiovascular Disease | Admitting: Cardiovascular Disease

## 2024-01-22 ENCOUNTER — Other Ambulatory Visit: Payer: Self-pay

## 2024-01-22 ENCOUNTER — Ambulatory Visit: Payer: Medicare Other | Admitting: Physical Therapy

## 2024-01-22 ENCOUNTER — Other Ambulatory Visit (HOSPITAL_BASED_OUTPATIENT_CLINIC_OR_DEPARTMENT_OTHER): Payer: Self-pay

## 2024-01-22 ENCOUNTER — Encounter: Payer: Self-pay | Admitting: Cardiovascular Disease

## 2024-01-22 VITALS — BP 138/82 | HR 77 | Ht 70.0 in | Wt 194.8 lb

## 2024-01-22 DIAGNOSIS — M5459 Other low back pain: Secondary | ICD-10-CM

## 2024-01-22 DIAGNOSIS — I2510 Atherosclerotic heart disease of native coronary artery without angina pectoris: Secondary | ICD-10-CM

## 2024-01-22 DIAGNOSIS — E7800 Pure hypercholesterolemia, unspecified: Secondary | ICD-10-CM

## 2024-01-22 DIAGNOSIS — M6281 Muscle weakness (generalized): Secondary | ICD-10-CM | POA: Diagnosis not present

## 2024-01-22 DIAGNOSIS — I10 Essential (primary) hypertension: Secondary | ICD-10-CM

## 2024-01-22 DIAGNOSIS — M5416 Radiculopathy, lumbar region: Secondary | ICD-10-CM

## 2024-01-22 MED ORDER — AMLODIPINE BESYLATE 10 MG PO TABS
10.0000 mg | ORAL_TABLET | Freq: Every day | ORAL | 3 refills | Status: AC
  Filled 2024-01-22: qty 90, 90d supply, fill #0
  Filled 2024-04-16: qty 90, 90d supply, fill #1
  Filled 2024-07-15: qty 90, 90d supply, fill #2
  Filled 2024-10-31: qty 90, 90d supply, fill #3

## 2024-01-22 MED ORDER — ROSUVASTATIN CALCIUM 40 MG PO TABS
40.0000 mg | ORAL_TABLET | Freq: Every day | ORAL | 3 refills | Status: AC
  Filled 2024-01-22: qty 90, 90d supply, fill #0
  Filled 2024-04-18: qty 90, 90d supply, fill #1
  Filled 2024-07-15: qty 90, 90d supply, fill #2
  Filled 2024-10-15: qty 90, 90d supply, fill #3

## 2024-01-22 NOTE — Therapy (Signed)
 OUTPATIENT PHYSICAL THERAPY THORACOLUMBAR TREATMENT  Patient Name: Dan Sanchez MRN: [id number] DOB:05/17/1956, 68 y.o., male Today's Date: 01/22/2024  END OF SESSION:  PT End of Session - 01/22/24 1051     Visit Number 14    Date for PT Re-Evaluation 02/19/24    Authorization Type BCBS    PT Start Time 1052    PT Stop Time 1142    PT Time Calculation (min) 50 min    Activity Tolerance Patient tolerated treatment well    Behavior During Therapy WFL for tasks assessed/performed              Past Medical History:  Diagnosis Date   Allergy    CAD (coronary artery disease)    mild non-obstructive   GERD (gastroesophageal reflux disease)    Hyperlipidemia    S/P right rotator cuff repair    Sinusitis, bacterial    Urinary incontinence    Past Surgical History:  Procedure Laterality Date   COLONOSCOPY  04/08/2009   LEFT HEART CATHETERIZATION WITH CORONARY ANGIOGRAM N/A 08/13/2014   Procedure: LEFT HEART CATHETERIZATION WITH CORONARY ANGIOGRAM;  Surgeon: Kathleene Hazel, MD;  Location: [facility] CATH LAB;  Service: Cardiovascular;  Laterality: N/A;   LUMBAR LAMINECTOMY FOR EPIDURAL ABSCESS N/A 08/30/2023   Procedure: LUMBAR LAMINECTOMY LUMBAR THREE-LUMBAR FOUR, LUMBAR FOUR-LUMBAR FIVE WITH EVACUATION OF EPIDURAL ABSCESS;  Surgeon: Jadene Pierini, MD;  Location: [facility] OR;  Service: Neurosurgery;  Laterality: N/A;   SHOULDER ARTHROSCOPY WITH ROTATOR CUFF REPAIR Right 10/26/2017   Procedure: RIGHT SHOULDER ARTHROSCOPY WITH ROTATOR CUFF REPAIR AND DEBRIDEMENT;  Surgeon: Eugenia Mcalpine, MD;  Location: [facility];  Service: Orthopedics;  Laterality: Right;   SHOULDER SURGERY     Right shoulder    TEE WITHOUT CARDIOVERSION N/A 09/04/2023   Procedure: TRANSESOPHAGEAL ECHOCARDIOGRAM;  Surgeon: Pricilla Riffle, MD;  Location: [facility] INVASIVE CV LAB;  Service: Cardiovascular;  Laterality: N/A;   Patient Active Problem List   Diagnosis Date Noted   Epidural abscess  11/14/2023   MSSA bacteremia 08/31/2023   Septic arthritis of lumbar spine (HCC) 08/29/2023   Normocytic anemia 08/29/2023   Elevated LFTs 08/29/2023   Osteoarthritis of knee 03/01/2023   Impingement syndrome of right shoulder region 02/27/2023   Coronary artery disease involving native coronary artery of native heart without angina pectoris 12/15/2022   Spinal stenosis of lumbar region with neurogenic claudication 03/23/2021   Lumbar radiculopathy 02/04/2021   HTN (hypertension) 03/29/2019   Overweight (BMI 25.0-29.9) 03/29/2019   Nasal septal deviation 02/01/2019   Nasal turbinate hypertrophy 02/01/2019   S/P right rotator cuff repair 10/26/2017   Contact dermatitis 08/27/2015   HSV-1 (herpes simplex virus 1) infection 08/27/2015   Bee sting allergy 09/10/2014   Chest pain 08/11/2014   Allergic rhinitis 08/10/2014   Allergic conjunctivitis 02/20/2014   Insomnia 02/03/2014   GERD (gastroesophageal reflux disease) 02/03/2014   General medical examination 03/16/2012   Hyperlipidemia 12/24/2007   ERECTILE DYSFUNCTION 12/24/2007    PCP: Beverely Low, MD  REFERRING PROVIDER: Shon Baton, MD  REFERRING DIAG: lumbar radiculopathy  Rationale for Evaluation and Treatment: Rehabilitation  THERAPY DIAG:  Muscle weakness (generalized)  Other low back pain  Radiculopathy, lumbar region  ONSET DATE: 08/30/23  SUBJECTIVE:  SUBJECTIVE STATEMENT: Doing okay, I cleaned the garage, back gets really sore.  PERTINENT HISTORY:  Lumbar laminectomy L3-5 08/30/23  PAIN:  Are you having pain? Yes: NPRS scale: 0/10 Pain location: just feel really stiff and a little sore Pain description: stiff and sore Aggravating factors: just get sore with activity Relieving factors: stretching,  PRECAUTIONS: None  RED  FLAGS: None   WEIGHT BEARING RESTRICTIONS: No  FALLS:  Has patient fallen in last 6 months? Yes, when legs were giving out  LIVING ENVIRONMENT: Lives with: lives with their family and lives with their spouse Lives in: House/apartment Stairs: Yes: Internal: 12 steps; bilateral but cannot reach both Has following equipment at home: None  OCCUPATION: sitting in sales, some driving  PLOF: Independent and yardwork, some pickleball 1 every month, golf every week  PATIENT GOALS: have less pain, move better, get my stamina and strength back  NEXT MD VISIT: none scheduled  OBJECTIVE:   DIAGNOSTIC FINDINGS:  Older X-ray with significant stenosis  COGNITION: Overall cognitive status: Within functional limits for tasks assessed     SENSATION: WFL  MUSCLE LENGTH: Very tight HS 55 degree SLR, right leg feels "more than left" Very tight ITB and piriformis mms + Thomas test bilateral, very tight quads  POSTURE: rounded shoulders, forward head, and decreased lumbar lordosis, slouched sitting posture  PALPATION: Tight with mild tenderness , scar is well healed  LUMBAR ROM:   AROM eval 12/08/23 12/15/23  Flexion Decreased 75%  Limited  25& Limited 25%  Extension Decreased 75%  Limited 50% Limited 50%  Right lateral flexion Decreased 75% Limited 25% WFL  Left lateral flexion Decreased 75% Limited 25% WFL  Right rotation  WFL   Left rotation  WFL    (Blank rows = not tested)  LOWER EXTREMITY ROM:     Stiff hips and HS  LOWER EXTREMITY MMT:   Right hip 4-/5, left hip 4+, knees 5-, ankles 4+  LUMBAR SPECIAL TESTS:  Straight leg raise test: Positive and Slump test: Negative    FUNCTIONAL TESTS:  Timed up and go (TUG): 10 seconds Patient does have right shoulder pain, I did a quick assessment of this, he has very weak RC bilaterally, ER was 3+/5.  He has right shoulder ROM that is limited with ER, flexion and abduction, negative impingement test, + empty can and + Yergason's  test.  Again very poor posture may influence the strain on the right biceps GAIT: Distance walked: 100 feet Assistive device utilized: None Level of assistance: Complete Independence Comments: forward flexed posture  TODAY'S TREATMENT:                                                                                                                              DATE:   Nustep level 6 x 4 minutes Bike level 5 x 5 minutes 3 power bursts Tmill push 20s x 3 Slant board calf stretch 15# straight arm pulls 3x10 25# AR  press 3x10 15# hip extension 2x10 10# hip abduction 2x10 Leg press 90# 3x10 Lats 55# 2x10 Feet on ball bridge, isometric abs Passive stretch LE's  01/08/24 Bike level 5 x 5 minutes Tmill push 20 s x 3 Slant board stretch Seated row 55# 3x10 Lats 55# 3x10 AR press 20# 45# triceps cues for form and core Leg press 80# 3x10 Hip extension 20# 2x10 Hip abduction 15# 20# sit to stand 2 x10 Passive stretch HS, piriformis, Hip flexor and quads  01/01/24 NuStep L 5 x 6 min Shoulder Ext 10lb 2x10 Slant board Calf stretch 5x10'' Seated Rows 55lb 3x10 Lats 55lb 3x10 HS curls 45lb 2x10 Leg Ext 20lb 2x15  Bridge /w March 2x10 AR pre 20lb standing on airex HS, piriformis, single K2x, Double K2C, glute stretch, ITB  To improve tissue elasticity   12/22/23 NuStep L 5 x 6 min S2S OHP 10lb 2x12 Standing Rows 20lb 2x15 Leg press 80lb 2x12 Horiz shoulder abd blue 2x12 Cross body rotation w/ yellow wball 2x10 Seated Rows 55lb 3x10 Lat pulls 55lb 2x10 LE on Pball bridges, oblq, K2C HS, piriformis, single K2x, Double K2C, glute stretch, ITB  12/18/23 Bike L4 x6 min Seated Rows & Lats 45lb 2x15 Standing Rows 25lb 2x10  Leg press 80lb 2x10 Slant board calf stretch Horiz abd blue 2x10 20lb farmers carry  LE on Pball bridges, oblq, K2C HS, piriformis, single K2x, Double K2C, glute stretch, ITB  12/14/22 Bike L3.5 x6 min Shoulder ER red 2x10 Horiz Abd blue 2x10 Oblqs 15lb  x10 each Shoulder Ext 15lb 3x10   S2S holding 20lb KB 2x12  HS, piriformis, single K2x, Double K2C, glute stretch, ITB Ionto patch 1mL dex R biceps tendon  12/11/23 Nustep level 6 x 6 minutes Rows & Lats 55lb 2x10  HS curls 45lb 2x15 Leg Ext 20lb 2x15  Bird dogs 2x5 each  S2S OHP 10lb Dumbbell 2x10 SL bridges 2x10  Dead bug LE only 3x5 each  HS, piriformis, single K2x, Double K2C, glute stretch, ITB  PATIENT EDUCATION:  Education details: HEP/POC Person educated: Patient Education method: Programmer, multimedia, Demonstration, Tactile cues, and Verbal cues Education comprehension: verbalized understanding  HOME EXERCISE PROGRAM: Access Code: [email or web address] URL: [email or web address] Date: 11/16/2023 Prepared by: Debroah Baller  Exercises - Supine Hamstring Stretch with Strap  - 1 x daily - 7 x weekly - 3 sets - 10 reps - Supine Bridge  - 1 x daily - 7 x weekly - 3 sets - 10 reps - Supine Figure 4 Piriformis Stretch  - 1 x daily - 7 x weekly - 3 sets - 10 reps  ASSESSMENT:  CLINICAL IMPRESSION: Patient is a 68  y.o. male who was seen today for physical therapy treatment for s/p lumbar laminectomy with septic arthritis on 08/30/23 past MRI's show significant stenosis. We have continued to work on overall fitness, core strength, posture and body mechanics awareness and flexibility.  He is improving in his flexibility and his strength  OBJECTIVE IMPAIRMENTS: Abnormal gait, cardiopulmonary status limiting activity, decreased activity tolerance, decreased balance, decreased endurance, decreased mobility, difficulty walking, decreased ROM, decreased strength, increased muscle spasms, impaired flexibility, improper body mechanics, postural dysfunction, and pain.   REHAB POTENTIAL: Good  CLINICAL DECISION MAKING: Stable/uncomplicated   EVALUATION COMPLEXITY: Low   GOALS: Goals reviewed with patient? Yes  SHORT TERM GOALS: Target date: 11/28/23  Independent with initial  HEP Goal status: Met 11/20/23  LONG TERM GOALS: Target date: 01/11/24  Independent with advanced HEP Goal status: progressing 01/22/24  2.  Decrease pain 50% Goal status: Met 12/08/23  3.  Increase lumbar ROM 25% Goal status: Met 12/07/22  4.  Increase SLR to 70 degrees Goal status:progressing 11/27/23, RLE 60 deg, LLE 55 deg 12/18/23  5.  Be able to play a round of golf without difficulty Goal status: progressing 01/22/24  PLAN:  PT FREQUENCY: 2x/week  PT DURATION: 12 weeks  PLANNED INTERVENTIONS: Therapeutic exercises, Therapeutic activity, Neuromuscular re-education, Balance training, Gait training, Patient/Family education, Self Care, Joint mobilization, Joint manipulation, Dry Needling, Electrical stimulation, Spinal manipulation, Spinal mobilization, Cryotherapy, Moist heat, Traction, Ultrasound, and Manual therapy.  PLAN FOR NEXT SESSION: will continue with current plan over the next period and will advance his gym program and will look to d/c   Jearld Lesch, PT 01/22/2024, 10:52 AM

## 2024-01-22 NOTE — Patient Instructions (Signed)
 Medication Instructions:  Crestor and amlodipine refilled today  *If you need a refill on your cardiac medications before your next appointment, please call your pharmacy*   Lab Work: none If you have labs (blood work) drawn today and your tests are completely normal, you will receive your results only by: MyChart Message (if you have MyChart) OR A paper copy in the mail If you have any lab test that is abnormal or we need to change your treatment, we will call you to review the results.   Testing/Procedures: none   Follow-Up: At [facility], you and your health needs are our priority.  As part of our continuing mission to provide you with exceptional heart care, we have created designated Provider Care Teams.  These Care Teams include your primary Cardiologist (physician) and Advanced Practice Providers (APPs -  Physician Assistants and Nurse Practitioners) who all work together to provide you with the care you need, when you need it.   Your next appointment:   12 month(s)  Provider:   Verne Carrow, MD

## 2024-01-22 NOTE — Progress Notes (Signed)
 Chief Complaint  Patient presents with   Follow-up    CAD   History of Present Illness: 68 yo male with history of CAD and hyperlipidemia here today for cardiac follow up. He had an abnormal stress test in 2015 with ST elevation in AVR, AVL but no chest pain with exercise. Cardiac cath 08/13/14 with mild disease in the LAD (20%) and RCA (20%). LV systolic function was normal. He was admitted in October 2024 with bacteremia and found to have lumbar septic arthritis and epidural abscesses requiring open laminectomy and epidural abscess. TEE October 2024 with LVEF=55-60%. No valve disease. No atrial shunting.   He is here today for follow up. The patient denies any chest pain, dyspnea, palpitations, lower extremity edema, orthopnea, PND, dizziness, near syncope or syncope.   Primary Care Physician: Sheliah Hatch, MD  Past Medical History:  Diagnosis Date   Allergy    CAD (coronary artery disease)    mild non-obstructive   GERD (gastroesophageal reflux disease)    Hyperlipidemia    S/P right rotator cuff repair    Sinusitis, bacterial    Urinary incontinence     Past Surgical History:  Procedure Laterality Date   COLONOSCOPY  04/08/2009   LEFT HEART CATHETERIZATION WITH CORONARY ANGIOGRAM N/A 08/13/2014   Procedure: LEFT HEART CATHETERIZATION WITH CORONARY ANGIOGRAM;  Surgeon: Kathleene Hazel, MD;  Location[facility] CATH LAB;  Service: Cardiovascular;  Laterality: N/A;   LUMBAR LAMINECTOMY FOR EPIDURAL ABSCESS N/A 08/30/2023   Procedure: LUMBAR LAMINECTOMY LUMBAR THREE-LUMBAR FOUR, LUMBAR FOUR-LUMBAR FIVE WITH EVACUATION OF EPIDURAL ABSCESS;  Surgeon: Jadene Pierini, MD;  Location: [facility] OR;  Service: Neurosurgery;  Laterality: N/A;   SHOULDER ARTHROSCOPY WITH ROTATOR CUFF REPAIR Right 10/26/2017   Procedure: RIGHT SHOULDER ARTHROSCOPY WITH ROTATOR CUFF REPAIR AND DEBRIDEMENT;  Surgeon: Eugenia Mcalpine, MD;  Location: [facility];  Service: Orthopedics;   Laterality: Right;   SHOULDER SURGERY     Right shoulder    TEE WITHOUT CARDIOVERSION N/A 09/04/2023   Procedure: TRANSESOPHAGEAL ECHOCARDIOGRAM;  Surgeon: Pricilla Riffle, MD;  Location: [facility] INVASIVE CV LAB;  Service: Cardiovascular;  Laterality: N/A;    Current Outpatient Medications  Medication Sig Dispense Refill   acetaminophen (TYLENOL) 325 MG tablet Take 2 tablets (650 mg total) by mouth every 8 (eight) hours as needed for mild pain.     ascorbic acid (VITAMIN C) 1000 MG tablet Take by mouth.     aspirin EC 81 MG tablet Take 1 tablet (81 mg total) by mouth daily. Swallow whole. 90 tablet 3   ceFAZolin (ANCEF) 10 g injection      celecoxib (CELEBREX) 200 MG capsule Take 1 capsule (200 mg total) by mouth 2 (two) times daily. 60 capsule 0   doxycycline (VIBRA-TABS) 100 MG tablet Take 1 tablet (100 mg total) by mouth 2 (two) times daily. Take on full stomach. Start on 11/14 60 tablet 1   fexofenadine (ALLEGRA) 180 MG tablet Take 180 mg by mouth daily.     fluticasone (FLONASE) 50 MCG/ACT nasal spray Place 2 sprays into both nostrils daily. 16 g 6   Omega-3 Fatty Acids (FISH OIL) 1200 MG CAPS Take 2,400 mg by mouth 2 (two) times daily.     omeprazole (PRILOSEC) 20 MG capsule Take 1 capsule (20 mg total) by mouth daily. 90 capsule 1   rosuvastatin (CRESTOR) 40 MG tablet Take 1 tablet (40 mg total) by mouth daily.     amLODipine (NORVASC) 10 MG tablet  Take 1 tablet (10 mg total) by mouth daily. 90 tablet 3   rosuvastatin (CRESTOR) 40 MG tablet Take 1 tablet (40 mg total) by mouth daily. 90 tablet 3   No current facility-administered medications for this visit.    Allergies  Allergen Reactions   Bee Venom Swelling    Social History   Socioeconomic History   Marital status: Married    Spouse name: Not on file   Number of children: Not on file   Years of education: Not on file   Highest education level: Not on file  Occupational History   Occupation: Curator: [name]  Tobacco Use   Smoking status: Never   Smokeless tobacco: Never  Vaping Use   Vaping status: Never Used  Substance and Sexual Activity   Alcohol use: Yes    Alcohol/week: 4.0 standard drinks of alcohol    Types: 4 Standard drinks or equivalent per week    Comment: occ   Drug use: No   Sexual activity: Not on file  Other Topics Concern   Not on file  Social History Narrative   Not on file   Social Drivers of Health   Financial Resource Strain: Not on file  Food Insecurity: No Food Insecurity (09/08/2023)   Hunger Vital Sign    Worried About Running Out of Food in the Last Year: Never true    Ran Out of Food in the Last Year: Never true  Transportation Needs: No Transportation Needs (09/11/2023)   PRAPARE - Administrator, Civil Service (Medical): No    Lack of Transportation (Non-Medical): No  Physical Activity: Not on file  Stress: Not on file  Social Connections: Not on file  Intimate Partner Violence: Not At Risk (10/17/2023)   Humiliation, Afraid, Rape, and Kick questionnaire    Fear of Current or Ex-Partner: No    Emotionally Abused: No    Physically Abused: No    Sexually Abused: No    Family History  Problem Relation Age of Onset   Heart disease Father        MI age 82   Colon cancer Neg Hx    Esophageal cancer Neg Hx    Rectal cancer Neg Hx    Stomach cancer Neg Hx    Colon polyps Neg Hx    Sleep apnea Neg Hx     Review of Systems:  As stated in the HPI and otherwise negative.   BP 138/82   Pulse 77   Ht 5\' 10"  (1.778 m)   Wt 88.4 kg   SpO2 98%   BMI 27.95 kg/m   Physical Examination:  General: Well developed, well nourished, NAD  HEENT: OP clear, mucus membranes moist  SKIN: warm, dry. No rashes. Neuro: No focal deficits  Musculoskeletal: Muscle strength 5/5 all ext  Psychiatric: Mood and affect normal  Neck: No JVD, no carotid bruits, no thyromegaly, no lymphadenopathy.  Lungs:Clear bilaterally, no  wheezes, rhonci, crackles Cardiovascular: Regular rate and rhythm. No murmurs, gallops or rubs. Abdomen:Soft. Bowel sounds present. Non-tender.  Extremities: No lower extremity edema. Pulses are 2 + in the bilateral DP/PT.  EKG:  EKG is ordered today. The ekg ordered today demonstrates  EKG Interpretation Date/Time:  Monday January 22 2024 08:13:26 EST Ventricular Rate:  74 PR Interval:  154 QRS Duration:  86 QT Interval:  398 QTC Calculation: 441 R Axis:   103  Text Interpretation: Normal sinus rhythm Confirmed by [name],  Cristal Deer [phone]) on 01/22/2024 8:26:41 AM    Recent Labs: 08/29/2023: TSH 1.131 09/04/2023: ALT 81; BUN 9; Creatinine, Ser 0.77; Hemoglobin 9.8; Magnesium 2.3; Platelets 407; Potassium 3.6; Sodium 136   Lipid Panel    Component Value Date/Time   CHOL 158 05/22/2023 1054   TRIG 128.0 05/22/2023 1054   TRIG 81 10/04/2006 1227   HDL 65.00 05/22/2023 1054   CHOLHDL 2 05/22/2023 1054   VLDL 25.6 05/22/2023 1054   LDLCALC 67 05/22/2023 1054   LDLDIRECT 120.9 03/16/2012 1054     Wt Readings from Last 3 Encounters:  01/22/24 88.4 kg  12/11/23 88.9 kg  11/15/23 85.7 kg    Assessment and Plan:   1. CAD without angina: He is known to have mild CAD by cath in September 2015. He does not have chest pain. Continue ASA and Crestor.   2. HLD: LDL at goal in June 2024. Followed in primary care. Labs pending this summer. Continue Crestor   3. HTN: BP is well controlled. Continue Norvasc. Refill today  Labs/ tests ordered today include:   Orders Placed This Encounter  Procedures   EKG 12-Lead   Disposition:   F/U with me in 12  months  Signed, Verne Carrow, MD 01/22/2024 9:06 AM    [facility] [address] [place], [place], [place]  [address] Phone: [phone]; Fax: [phone]

## 2024-01-29 ENCOUNTER — Ambulatory Visit: Payer: Medicare Other | Attending: Orthopedic Surgery | Admitting: Physical Therapy

## 2024-01-29 ENCOUNTER — Encounter: Payer: Self-pay | Admitting: Physical Therapy

## 2024-01-29 DIAGNOSIS — M5459 Other low back pain: Secondary | ICD-10-CM | POA: Diagnosis not present

## 2024-01-29 DIAGNOSIS — M5416 Radiculopathy, lumbar region: Secondary | ICD-10-CM | POA: Diagnosis not present

## 2024-01-29 DIAGNOSIS — M6281 Muscle weakness (generalized): Secondary | ICD-10-CM | POA: Insufficient documentation

## 2024-01-29 NOTE — Therapy (Signed)
 OUTPATIENT PHYSICAL THERAPY THORACOLUMBAR TREATMENT  Patient Name: Dan Sanchez MRN: [id number] DOB:September 28, 1956, 68 y.o., male Today's Date: 01/29/2024  END OF SESSION:  PT End of Session - 01/29/24 1145     Visit Number 15    Date for PT Re-Evaluation 02/19/24    PT Start Time 1145    PT Stop Time 1230    PT Time Calculation (min) 45 min    Activity Tolerance Patient tolerated treatment well    Behavior During Therapy [facility] for tasks assessed/performed              Past Medical History:  Diagnosis Date   Allergy    CAD (coronary artery disease)    mild non-obstructive   GERD (gastroesophageal reflux disease)    Hyperlipidemia    S/P right rotator cuff repair    Sinusitis, bacterial    Urinary incontinence    Past Surgical History:  Procedure Laterality Date   COLONOSCOPY  04/08/2009   LEFT HEART CATHETERIZATION WITH CORONARY ANGIOGRAM N/A 08/13/2014   Procedure: LEFT HEART CATHETERIZATION WITH CORONARY ANGIOGRAM;  Surgeon: Kathleene Hazel, MD;  Location: [facility] CATH LAB;  Service: Cardiovascular;  Laterality: N/A;   LUMBAR LAMINECTOMY FOR EPIDURAL ABSCESS N/A 08/30/2023   Procedure: LUMBAR LAMINECTOMY LUMBAR THREE-LUMBAR FOUR, LUMBAR FOUR-LUMBAR FIVE WITH EVACUATION OF EPIDURAL ABSCESS;  Surgeon: Jadene Pierini, MD;  Location: [facility] OR;  Service: Neurosurgery;  Laterality: N/A;   SHOULDER ARTHROSCOPY WITH ROTATOR CUFF REPAIR Right 10/26/2017   Procedure: RIGHT SHOULDER ARTHROSCOPY WITH ROTATOR CUFF REPAIR AND DEBRIDEMENT;  Surgeon: Eugenia Mcalpine, MD;  Location: [facility];  Service: Orthopedics;  Laterality: Right;   SHOULDER SURGERY     Right shoulder    TEE WITHOUT CARDIOVERSION N/A 09/04/2023   Procedure: TRANSESOPHAGEAL ECHOCARDIOGRAM;  Surgeon: Pricilla Riffle, MD;  Location: [facility] INVASIVE CV LAB;  Service: Cardiovascular;  Laterality: N/A;   Patient Active Problem List   Diagnosis Date Noted   Epidural abscess 11/14/2023   MSSA bacteremia  08/31/2023   Septic arthritis of lumbar spine (HCC) 08/29/2023   Normocytic anemia 08/29/2023   Elevated LFTs 08/29/2023   Osteoarthritis of knee 03/01/2023   Impingement syndrome of right shoulder region 02/27/2023   Coronary artery disease involving native coronary artery of native heart without angina pectoris 12/15/2022   Spinal stenosis of lumbar region with neurogenic claudication 03/23/2021   Lumbar radiculopathy 02/04/2021   HTN (hypertension) 03/29/2019   Overweight (BMI 25.0-29.9) 03/29/2019   Nasal septal deviation 02/01/2019   Nasal turbinate hypertrophy 02/01/2019   S/P right rotator cuff repair 10/26/2017   Contact dermatitis 08/27/2015   HSV-1 (herpes simplex virus 1) infection 08/27/2015   Bee sting allergy 09/10/2014   Chest pain 08/11/2014   Allergic rhinitis 08/10/2014   Allergic conjunctivitis 02/20/2014   Insomnia 02/03/2014   GERD (gastroesophageal reflux disease) 02/03/2014   General medical examination 03/16/2012   Hyperlipidemia 12/24/2007   ERECTILE DYSFUNCTION 12/24/2007    PCP: Beverely Low, MD  REFERRING PROVIDER: Shon Baton, MD  REFERRING DIAG: lumbar radiculopathy  Rationale for Evaluation and Treatment: Rehabilitation  THERAPY DIAG:  Muscle weakness (generalized)  Radiculopathy, lumbar region  Other low back pain  ONSET DATE: 08/30/23  SUBJECTIVE:  SUBJECTIVE STATEMENT: Doing okay, played pickle ball over the weekend, some soreness in his lower back  PERTINENT HISTORY:  Lumbar laminectomy L3-5 08/30/23  PAIN:  Are you having pain? Yes: NPRS scale: 0/10 Pain location: just feel really stiff and a little sore Pain description: stiff and sore Aggravating factors: just get sore with activity Relieving factors: stretching,  PRECAUTIONS: None  RED  FLAGS: None   WEIGHT BEARING RESTRICTIONS: No  FALLS:  Has patient fallen in last 6 months? Yes, when legs were giving out  LIVING ENVIRONMENT: Lives with: lives with their family and lives with their spouse Lives in: House/apartment Stairs: Yes: Internal: 12 steps; bilateral but cannot reach both Has following equipment at home: None  OCCUPATION: sitting in sales, some driving  PLOF: Independent and yardwork, some pickleball 1 every month, golf every week  PATIENT GOALS: have less pain, move better, get my stamina and strength back  NEXT MD VISIT: none scheduled  OBJECTIVE:   DIAGNOSTIC FINDINGS:  Older X-ray with significant stenosis  COGNITION: Overall cognitive status: Within functional limits for tasks assessed     SENSATION: WFL  MUSCLE LENGTH: Very tight HS 55 degree SLR, right leg feels "more than left" Very tight ITB and piriformis mms + Thomas test bilateral, very tight quads  POSTURE: rounded shoulders, forward head, and decreased lumbar lordosis, slouched sitting posture  PALPATION: Tight with mild tenderness , scar is well healed  LUMBAR ROM:   AROM eval 12/08/23 12/15/23  Flexion Decreased 75%  Limited  25& Limited 25%  Extension Decreased 75%  Limited 50% Limited 50%  Right lateral flexion Decreased 75% Limited 25% WFL  Left lateral flexion Decreased 75% Limited 25% WFL  Right rotation  WFL   Left rotation  WFL    (Blank rows = not tested)  LOWER EXTREMITY ROM:     Stiff hips and HS  LOWER EXTREMITY MMT:   Right hip 4-/5, left hip 4+, knees 5-, ankles 4+  LUMBAR SPECIAL TESTS:  Straight leg raise test: Positive and Slump test: Negative    FUNCTIONAL TESTS:  Timed up and go (TUG): 10 seconds Patient does have right shoulder pain, I did a quick assessment of this, he has very weak RC bilaterally, ER was 3+/5.  He has right shoulder ROM that is limited with ER, flexion and abduction, negative impingement test, + empty can and + Yergason's  test.  Again very poor posture may influence the strain on the right biceps GAIT: Distance walked: 100 feet Assistive device utilized: None Level of assistance: Complete Independence Comments: forward flexed posture  TODAY'S TREATMENT:                                                                                                                              DATE:  01/29/24 NuStep L5 x 6 min Tmill push 20s x 3 S2S OHP 10lb 2x12 Leg press 90# 3x10 Rows & Lats 55lb 2x12 Resisted sides steps &  backwards walking x5 each 50lb  AR Press 20lb 5 seconds hold x5   Passive stretch LE's, Piriformis, HS, K2C lower trunk rotations   Nustep level 6 x 4 minutes Bike level 5 x 5 minutes 3 power bursts Tmill push 20s x 3 Slant board calf stretch 15# straight arm pulls 3x10 25# AR press 3x10 15# hip extension 2x10 10# hip abduction 2x10 Leg press 90# 3x10 Lats 55# 2x10 Feet on ball bridge, isometric abs Passive stretch LE's  01/08/24 Bike level 5 x 5 minutes Tmill push 20 s x 3 Slant board stretch Seated row 55# 3x10 Lats 55# 3x10 AR press 20# 45# triceps cues for form and core Leg press 80# 3x10 Hip extension 20# 2x10 Hip abduction 15# 20# sit to stand 2 x10 Passive stretch HS, piriformis, Hip flexor and quads  01/01/24 NuStep L 5 x 6 min Shoulder Ext 10lb 2x10 Slant board Calf stretch 5x10'' Seated Rows 55lb 3x10 Lats 55lb 3x10 HS curls 45lb 2x10 Leg Ext 20lb 2x15  Bridge /w March 2x10 AR pre 20lb standing on airex HS, piriformis, single K2x, Double K2C, glute stretch, ITB  To improve tissue elasticity   12/22/23 NuStep L 5 x 6 min S2S OHP 10lb 2x12 Standing Rows 20lb 2x15 Leg press 80lb 2x12 Horiz shoulder abd blue 2x12 Cross body rotation w/ yellow wball 2x10 Seated Rows 55lb 3x10 Lat pulls 55lb 2x10 LE on Pball bridges, oblq, K2C HS, piriformis, single K2x, Double K2C, glute stretch, ITB  12/18/23 Bike L4 x6 min Seated Rows & Lats 45lb 2x15 Standing Rows 25lb  2x10  Leg press 80lb 2x10 Slant board calf stretch Horiz abd blue 2x10 20lb farmers carry  LE on Pball bridges, oblq, K2C HS, piriformis, single K2x, Double K2C, glute stretch, ITB  12/14/22 Bike L3.5 x6 min Shoulder ER red 2x10 Horiz Abd blue 2x10 Oblqs 15lb x10 each Shoulder Ext 15lb 3x10   S2S holding 20lb KB 2x12  HS, piriformis, single K2x, Double K2C, glute stretch, ITB Ionto patch 1mL dex R biceps tendon  12/11/23 Nustep level 6 x 6 minutes Rows & Lats 55lb 2x10  HS curls 45lb 2x15 Leg Ext 20lb 2x15  Bird dogs 2x5 each  S2S OHP 10lb Dumbbell 2x10 SL bridges 2x10  Dead bug LE only 3x5 each  HS, piriformis, single K2x, Double K2C, glute stretch, ITB  PATIENT EDUCATION:  Education details: HEP/POC Person educated: Patient Education method: Programmer, multimedia, Demonstration, Tactile cues, and Verbal cues Education comprehension: verbalized understanding  HOME EXERCISE PROGRAM: Access Code: [email or web address] URL: [email or web address] Date: 11/16/2023 Prepared by: Debroah Baller  Exercises - Supine Hamstring Stretch with Strap  - 1 x daily - 7 x weekly - 3 sets - 10 reps - Supine Bridge  - 1 x daily - 7 x weekly - 3 sets - 10 reps - Supine Figure 4 Piriformis Stretch  - 1 x daily - 7 x weekly - 3 sets - 10 reps  ASSESSMENT:  CLINICAL IMPRESSION: Patient is a 68  y.o. male who was seen today for physical therapy treatment for s/p lumbar laminectomy with septic arthritis on 08/30/23 past MRI's show significant stenosis. We have continued to work on overall fitness, core strength, posture and body mechanics awareness and flexibility.  He is improving in his flexibility and his strength. Increase resistance tolerated on leg press and with S2S OHP, No reports of pain during session.  OBJECTIVE IMPAIRMENTS: Abnormal gait, cardiopulmonary status limiting activity, decreased activity tolerance, decreased balance, decreased endurance, decreased mobility, difficulty walking,  decreased ROM, decreased strength, increased muscle spasms, impaired flexibility, improper body mechanics, postural dysfunction, and pain.   REHAB POTENTIAL: Good  CLINICAL DECISION MAKING: Stable/uncomplicated   EVALUATION COMPLEXITY: Low   GOALS: Goals reviewed with patient? Yes  SHORT TERM GOALS: Target date: 11/28/23  Independent with initial HEP Goal status: Met 11/20/23  LONG TERM GOALS: Target date: 01/11/24  Independent with advanced HEP Goal status: progressing 01/22/24  2.  Decrease pain 50% Goal status: Met 12/08/23  3.  Increase lumbar ROM 25% Goal status: Met 12/07/22  4.  Increase SLR to 70 degrees Goal status:progressing 11/27/23, RLE 60 deg, LLE 55 deg 12/18/23  5.  Be able to play a round of golf without difficulty Goal status: progressing 01/22/24  PLAN:  PT FREQUENCY: 2x/week  PT DURATION: 12 weeks  PLANNED INTERVENTIONS: Therapeutic exercises, Therapeutic activity, Neuromuscular re-education, Balance training, Gait training, Patient/Family education, Self Care, Joint mobilization, Joint manipulation, Dry Needling, Electrical stimulation, Spinal manipulation, Spinal mobilization, Cryotherapy, Moist heat, Traction, Ultrasound, and Manual therapy.  PLAN FOR NEXT SESSION: will continue with current plan over the next period and will advance his gym program and will look to d/c   Grayce Sessions, PTA 01/29/2024, 11:46 AM

## 2024-02-05 ENCOUNTER — Ambulatory Visit: Payer: Medicare Other | Admitting: Internal Medicine

## 2024-02-05 ENCOUNTER — Ambulatory Visit: Payer: Medicare Other | Admitting: Physical Therapy

## 2024-02-05 ENCOUNTER — Encounter: Payer: Self-pay | Admitting: Internal Medicine

## 2024-02-05 ENCOUNTER — Other Ambulatory Visit: Payer: Self-pay

## 2024-02-05 ENCOUNTER — Encounter: Payer: Self-pay | Admitting: Physical Therapy

## 2024-02-05 VITALS — BP 136/80 | HR 76 | Temp 98.0°F | Ht 70.0 in | Wt 197.0 lb

## 2024-02-05 DIAGNOSIS — M6281 Muscle weakness (generalized): Secondary | ICD-10-CM | POA: Diagnosis not present

## 2024-02-05 DIAGNOSIS — M5459 Other low back pain: Secondary | ICD-10-CM | POA: Diagnosis not present

## 2024-02-05 DIAGNOSIS — M5416 Radiculopathy, lumbar region: Secondary | ICD-10-CM

## 2024-02-05 DIAGNOSIS — G061 Intraspinal abscess and granuloma: Secondary | ICD-10-CM

## 2024-02-05 NOTE — Progress Notes (Signed)
 Patient ID: Dan Sanchez, male   DOB: Jan 06, 1956, 68 y.o.   MRN: [id number]  HPI 68yo M with hx of  MSSA epidural abscess s/p L3-L5 open laminectomy and evacuation of epidural abscess on 08/30/23 and secondary bacteremia . Has been on abtx through 11/18  and transitioned to oral doxycycline. He completed treatment through beginning of January. Now being monitored off of abtx  Played golf did well Pt only once a week Has dental appt to evaluate if tooth extraction needed for failed root canal  Lab Results  Component Value Date   ESRSEDRATE 2 12/11/2023   Lab Results  Component Value Date   CRP <3.0 12/11/2023     Outpatient Encounter Medications as of 02/05/2024  Medication Sig   acetaminophen (TYLENOL) 325 MG tablet Take 2 tablets (650 mg total) by mouth every 8 (eight) hours as needed for mild pain.   amLODipine (NORVASC) 10 MG tablet Take 1 tablet (10 mg total) by mouth daily.   ascorbic acid (VITAMIN C) 1000 MG tablet Take by mouth.   aspirin EC 81 MG tablet Take 1 tablet (81 mg total) by mouth daily. Swallow whole.   ceFAZolin (ANCEF) 10 g injection    celecoxib (CELEBREX) 200 MG capsule Take 1 capsule (200 mg total) by mouth 2 (two) times daily.   doxycycline (VIBRA-TABS) 100 MG tablet Take 1 tablet (100 mg total) by mouth 2 (two) times daily. Take on full stomach. Start on 11/14   fexofenadine (ALLEGRA) 180 MG tablet Take 180 mg by mouth daily.   fluticasone (FLONASE) 50 MCG/ACT nasal spray Place 2 sprays into both nostrils daily.   Omega-3 Fatty Acids (FISH OIL) 1200 MG CAPS Take 2,400 mg by mouth 2 (two) times daily.   omeprazole (PRILOSEC) 20 MG capsule Take 1 capsule (20 mg total) by mouth daily.   rosuvastatin (CRESTOR) 40 MG tablet Take 1 tablet (40 mg total) by mouth daily.   rosuvastatin (CRESTOR) 40 MG tablet Take 1 tablet (40 mg total) by mouth daily.   No facility-administered encounter medications on file as of 02/05/2024.     Patient Active Problem List    Diagnosis Date Noted   Epidural abscess 11/14/2023   MSSA bacteremia 08/31/2023   Septic arthritis of lumbar spine (HCC) 08/29/2023   Normocytic anemia 08/29/2023   Elevated LFTs 08/29/2023   Osteoarthritis of knee 03/01/2023   Impingement syndrome of right shoulder region 02/27/2023   Coronary artery disease involving native coronary artery of native heart without angina pectoris 12/15/2022   Spinal stenosis of lumbar region with neurogenic claudication 03/23/2021   Lumbar radiculopathy 02/04/2021   HTN (hypertension) 03/29/2019   Overweight (BMI 25.0-29.9) 03/29/2019   Nasal septal deviation 02/01/2019   Nasal turbinate hypertrophy 02/01/2019   S/P right rotator cuff repair 10/26/2017   Contact dermatitis 08/27/2015   HSV-1 (herpes simplex virus 1) infection 08/27/2015   Bee sting allergy 09/10/2014   Chest pain 08/11/2014   Allergic rhinitis 08/10/2014   Allergic conjunctivitis 02/20/2014   Insomnia 02/03/2014   GERD (gastroesophageal reflux disease) 02/03/2014   General medical examination 03/16/2012   Hyperlipidemia 12/24/2007   ERECTILE DYSFUNCTION 12/24/2007     Health Maintenance Due  Topic Date Due   Zoster Vaccines- Shingrix (1 of 2) Never done   COVID-19 Vaccine (4 - 2024-25 season) 07/30/2023     Review of Systems  Physical Exam   BP 136/80   Pulse 76   Temp 98 F (36.7 C) (Oral)   Ht  5\' 10"  (1.778 m)   Wt 197 lb (89.4 kg)   SpO2 98%   BMI 28.27 kg/m  Physical Exam  Constitutional: He is oriented to person, place, and time. He appears well-developed and well-nourished. No distress.  HENT:  Mouth/Throat: Oropharynx is clear and moist. No oropharyngeal exudate.  Neurological: He is alert and oriented to person, place, and time.  Skin: Skin is warm and dry. No rash noted. No erythema.  Psychiatric: He has a normal mood and affect. His behavior is normal.   CBC Lab Results  Component Value Date   WBC 10.5 09/04/2023   RBC 3.43 (L) 09/04/2023    HGB 9.8 (L) 09/04/2023   HCT 30.2 (L) 09/04/2023   PLT 407 (H) 09/04/2023   MCV 88.0 09/04/2023   MCH 28.6 09/04/2023   MCHC 32.5 09/04/2023   RDW 12.6 09/04/2023   LYMPHSABS 1.0 08/29/2023   MONOABS 1.0 08/29/2023   EOSABS 0.0 08/29/2023    BMET Lab Results  Component Value Date   NA 136 09/04/2023   K 3.6 09/04/2023   CL 101 09/04/2023   CO2 26 09/04/2023   GLUCOSE 127 (H) 09/04/2023   BUN 9 09/04/2023   CREATININE 0.77 09/04/2023   CALCIUM 8.2 (L) 09/04/2023   GFRNONAA >60 09/04/2023     Lab Results  Component Value Date   ESRSEDRATE 6 02/05/2024   Lab Results  Component Value Date   CRP <3.0 02/05/2024    Assessment and Plan  Epidural abscess/ discitis/om = monitor off of abtx. Will check sed rate and crp to ensure no recent flares  Addendum = inflammatory markers are WNL.

## 2024-02-05 NOTE — Therapy (Signed)
 OUTPATIENT PHYSICAL THERAPY THORACOLUMBAR TREATMENT  Patient Name: Dan Sanchez MRN: [id number] DOB:[birth year], 68 y.o., male Today's Date: 02/05/2024  END OF SESSION:  PT End of Session - 02/05/24 1425     Visit Number 16    Date for PT Re-Evaluation 02/19/24    PT Start Time 1425    PT Stop Time 1510    PT Time Calculation (min) 45 min    Activity Tolerance Patient tolerated treatment well    Behavior During Therapy [facility] for tasks assessed/performed              Past Medical History:  Diagnosis Date   Allergy    CAD (coronary artery disease)    mild non-obstructive   GERD (gastroesophageal reflux disease)    Hyperlipidemia    S/P right rotator cuff repair    Sinusitis, bacterial    Urinary incontinence    Past Surgical History:  Procedure Laterality Date   COLONOSCOPY  04/08/2009   LEFT HEART CATHETERIZATION WITH CORONARY ANGIOGRAM N/A 08/13/2014   Procedure: LEFT HEART CATHETERIZATION WITH CORONARY ANGIOGRAM;  Surgeon: Kathleene Hazel, MD;  Location: [facility] CATH LAB;  Service: Cardiovascular;  Laterality: N/A;   LUMBAR LAMINECTOMY FOR EPIDURAL ABSCESS N/A 08/30/2023   Procedure: LUMBAR LAMINECTOMY LUMBAR THREE-LUMBAR FOUR, LUMBAR FOUR-LUMBAR FIVE WITH EVACUATION OF EPIDURAL ABSCESS;  Surgeon: Jadene Pierini, MD;  Location: [facility] OR;  Service: Neurosurgery;  Laterality: N/A;   SHOULDER ARTHROSCOPY WITH ROTATOR CUFF REPAIR Right 10/26/2017   Procedure: RIGHT SHOULDER ARTHROSCOPY WITH ROTATOR CUFF REPAIR AND DEBRIDEMENT;  Surgeon: Eugenia Mcalpine, MD;  Location: [facility];  Service: Orthopedics;  Laterality: Right;   SHOULDER SURGERY     Right shoulder    TEE WITHOUT CARDIOVERSION N/A 09/04/2023   Procedure: TRANSESOPHAGEAL ECHOCARDIOGRAM;  Surgeon: Pricilla Riffle, MD;  Location: [facility] INVASIVE CV LAB;  Service: Cardiovascular;  Laterality: N/A;   Patient Active Problem List   Diagnosis Date Noted   Epidural abscess 11/14/2023   MSSA bacteremia  08/31/2023   Septic arthritis of lumbar spine (HCC) 08/29/2023   Normocytic anemia 08/29/2023   Elevated LFTs 08/29/2023   Osteoarthritis of knee 03/01/2023   Impingement syndrome of right shoulder region 02/27/2023   Coronary artery disease involving native coronary artery of native heart without angina pectoris 12/15/2022   Spinal stenosis of lumbar region with neurogenic claudication 03/23/2021   Lumbar radiculopathy 02/04/2021   HTN (hypertension) 03/29/2019   Overweight (BMI 25.0-29.9) 03/29/2019   Nasal septal deviation 02/01/2019   Nasal turbinate hypertrophy 02/01/2019   S/P right rotator cuff repair 10/26/2017   Contact dermatitis 08/27/2015   HSV-1 (herpes simplex virus 1) infection 08/27/2015   Bee sting allergy 09/10/2014   Chest pain 08/11/2014   Allergic rhinitis 08/10/2014   Allergic conjunctivitis 02/20/2014   Insomnia 02/03/2014   GERD (gastroesophageal reflux disease) 02/03/2014   General medical examination 03/16/2012   Hyperlipidemia 12/24/2007   ERECTILE DYSFUNCTION 12/24/2007    PCP: Beverely Low, MD  REFERRING PROVIDER: Shon Baton, MD  REFERRING DIAG: lumbar radiculopathy  Rationale for Evaluation and Treatment: Rehabilitation  THERAPY DIAG:  Muscle weakness (generalized)  Radiculopathy, lumbar region  ONSET DATE: 08/30/23  SUBJECTIVE:  SUBJECTIVE STATEMENT: "Good" [place]  PERTINENT HISTORY:  Lumbar laminectomy L3-5 08/30/23  PAIN:  Are you having pain? Yes: NPRS scale: 0/10 Pain location: just feel really stiff and a little sore Pain description: stiff and sore Aggravating factors: just get sore with activity Relieving factors: stretching,  PRECAUTIONS: None  RED FLAGS: None   WEIGHT BEARING RESTRICTIONS: No  FALLS:  Has  patient fallen in last 6 months? Yes, when legs were giving out  LIVING ENVIRONMENT: Lives with: lives with their family and lives with their spouse Lives in: House/apartment Stairs: Yes: Internal: 12 steps; bilateral but cannot reach both Has following equipment at home: None  OCCUPATION: sitting in sales, some driving  PLOF: Independent and yardwork, some pickleball 1 every month, golf every week  PATIENT GOALS: have less pain, move better, get my stamina and strength back  NEXT MD VISIT: none scheduled  OBJECTIVE:   DIAGNOSTIC FINDINGS:  Older X-ray with significant stenosis  COGNITION: Overall cognitive status: Within functional limits for tasks assessed     SENSATION: WFL  MUSCLE LENGTH: Very tight HS 55 degree SLR, right leg feels "more than left" Very tight ITB and piriformis mms + Thomas test bilateral, very tight quads  POSTURE: rounded shoulders, forward head, and decreased lumbar lordosis, slouched sitting posture  PALPATION: Tight with mild tenderness , scar is well healed  LUMBAR ROM:   AROM eval 12/08/23 12/15/23  Flexion Decreased 75%  Limited  25& Limited 25%  Extension Decreased 75%  Limited 50% Limited 50%  Right lateral flexion Decreased 75% Limited 25% WFL  Left lateral flexion Decreased 75% Limited 25% WFL  Right rotation  WFL   Left rotation  WFL    (Blank rows = not tested)  LOWER EXTREMITY ROM:     Stiff hips and HS  LOWER EXTREMITY MMT:   Right hip 4-/5, left hip 4+, knees 5-, ankles 4+  LUMBAR SPECIAL TESTS:  Straight leg raise test: Positive and Slump test: Negative    FUNCTIONAL TESTS:  Timed up and go (TUG): 10 seconds Patient does have right shoulder pain, I did a quick assessment of this, he has very weak RC bilaterally, ER was 3+/5.  He has right shoulder ROM that is limited with ER, flexion and abduction, negative impingement test, + empty can and + Yergason's test.  Again very poor posture may influence the strain on the  right biceps GAIT: Distance walked: 100 feet Assistive device utilized: None Level of assistance: Complete Independence Comments: forward flexed posture  TODAY'S TREATMENT:                                                                                                                              DATE:  02/05/24 NuStep L5 x6 Elliptical L3 x 2 min HS curls 55lb 2x12 Leg Ext 15lb 2x12 Sit to stand 2x12 holding 20lb ball  50lb resisted side steps holding 20lb ball  Rows & Lats 55lb 2x12 AR press 20lb on airex x10 each  Slant board calf stretch   Passive stretch LE's, Piriformis, HS, K2C lower trunk rotations   01/29/24 NuStep L5 x 6 min Tmill push 20s x 3 S2S OHP 10lb 2x12 Leg press 90# 3x10 Rows & Lats 55lb 2x12 Resisted sides steps & backwards walking x5 each 50lb  AR Press 20lb 5 seconds hold x5   Passive stretch LE's, Piriformis, HS, K2C lower trunk rotations   Nustep level 6 x 4 minutes Bike level 5 x 5 minutes 3 power bursts Tmill push 20s x 3 Slant board calf stretch 15# straight arm pulls 3x10 25# AR press 3x10 15# hip extension 2x10 10# hip abduction 2x10 Leg press 90# 3x10 Lats 55# 2x10 Feet on ball bridge, isometric abs Passive stretch LE's  01/08/24 Bike level 5 x 5 minutes Tmill push 20 s x 3 Slant board stretch Seated row 55# 3x10 Lats 55# 3x10 AR press 20# 45# triceps cues for form and core Leg press 80# 3x10 Hip extension 20# 2x10 Hip abduction 15# 20# sit to stand 2 x10 Passive stretch HS, piriformis, Hip flexor and quads  01/01/24 NuStep L 5 x 6 min Shoulder Ext 10lb 2x10 Slant board Calf stretch 5x10'' Seated Rows 55lb 3x10 Lats 55lb 3x10 HS curls 45lb 2x10 Leg Ext 20lb 2x15  Bridge /w March 2x10 AR pre 20lb standing on airex HS, piriformis, single K2x, Double K2C, glute stretch, ITB  To improve tissue elasticity   12/22/23 NuStep L 5 x 6 min S2S OHP 10lb 2x12 Standing Rows 20lb 2x15 Leg press 80lb 2x12 Horiz shoulder abd blue  2x12 Cross body rotation w/ yellow wball 2x10 Seated Rows 55lb 3x10 Lat pulls 55lb 2x10 LE on Pball bridges, oblq, K2C HS, piriformis, single K2x, Double K2C, glute stretch, ITB  12/18/23 Bike L4 x6 min Seated Rows & Lats 45lb 2x15 Standing Rows 25lb 2x10  Leg press 80lb 2x10 Slant board calf stretch Horiz abd blue 2x10 20lb farmers carry  LE on Pball bridges, oblq, K2C HS, piriformis, single K2x, Double K2C, glute stretch, ITB  12/14/22 Bike L3.5 x6 min Shoulder ER red 2x10 Horiz Abd blue 2x10 Oblqs 15lb x10 each Shoulder Ext 15lb 3x10   S2S holding 20lb KB 2x12  HS, piriformis, single K2x, Double K2C, glute stretch, ITB Ionto patch 1mL dex R biceps tendon  12/11/23 Nustep level 6 x 6 minutes Rows & Lats 55lb 2x10  HS curls 45lb 2x15 Leg Ext 20lb 2x15  Bird dogs 2x5 each  S2S OHP 10lb Dumbbell 2x10 SL bridges 2x10  Dead bug LE only 3x5 each  HS, piriformis, single K2x, Double K2C, glute stretch, ITB  PATIENT EDUCATION:  Education details: HEP/POC Person educated: Patient Education method: Programmer, multimedia, Demonstration, Tactile cues, and Verbal cues Education comprehension: verbalized understanding  HOME EXERCISE PROGRAM: Access Code: [email or web address] URL: [email or web address] Date: 11/16/2023 Prepared by: Debroah Baller  Exercises - Supine Hamstring Stretch with Strap  - 1 x daily - 7 x weekly - 3 sets - 10 reps - Supine Bridge  - 1 x daily - 7 x weekly - 3 sets - 10 reps - Supine Figure 4 Piriformis Stretch  - 1 x daily - 7 x weekly - 3 sets - 10 reps  ASSESSMENT:  CLINICAL IMPRESSION: Patient is a 68  y.o. male who was seen today for physical therapy treatment for s/p lumbar laminectomy with septic arthritis on 08/30/23 past MRI's show significant stenosis. We have continued to work  on overall fitness, core strength, posture and body mechanics awareness and flexibility. He is improving in his flexibility and his strength. Increase fatigue present with  sit to stands and side steps holding 20lb  ball, No reports of pain during session.  OBJECTIVE IMPAIRMENTS: Abnormal gait, cardiopulmonary status limiting activity, decreased activity tolerance, decreased balance, decreased endurance, decreased mobility, difficulty walking, decreased ROM, decreased strength, increased muscle spasms, impaired flexibility, improper body mechanics, postural dysfunction, and pain.   REHAB POTENTIAL: Good  CLINICAL DECISION MAKING: Stable/uncomplicated   EVALUATION COMPLEXITY: Low   GOALS: Goals reviewed with patient? Yes  SHORT TERM GOALS: Target date: 11/28/23  Independent with initial HEP Goal status: Met 11/20/23  LONG TERM GOALS: Target date: 01/11/24  Independent with advanced HEP Goal status: progressing 01/22/24  2.  Decrease pain 50% Goal status: Met 12/08/23  3.  Increase lumbar ROM 25% Goal status: Met 12/07/22  4.  Increase SLR to 70 degrees Goal status:progressing 11/27/23, RLE 60 deg, LLE 55 deg 12/18/23  5.  Be able to play a round of golf without difficulty Goal status: progressing 01/22/24  PLAN:  PT FREQUENCY: 2x/week  PT DURATION: 12 weeks  PLANNED INTERVENTIONS: Therapeutic exercises, Therapeutic activity, Neuromuscular re-education, Balance training, Gait training, Patient/Family education, Self Care, Joint mobilization, Joint manipulation, Dry Needling, Electrical stimulation, Spinal manipulation, Spinal mobilization, Cryotherapy, Moist heat, Traction, Ultrasound, and Manual therapy.  PLAN FOR NEXT SESSION: will continue with current plan over the next period and will advance his gym program and will look to d/c   Grayce Sessions, PTA 02/05/2024, 2:26 PM

## 2024-02-06 LAB — C-REACTIVE PROTEIN: CRP: <3 mg/L (ref <8.0)

## 2024-02-06 LAB — SEDIMENTATION RATE: Sed Rate: 6 mm/h (ref 0–20)

## 2024-02-12 DIAGNOSIS — C44329 Squamous cell carcinoma of skin of other parts of face: Secondary | ICD-10-CM | POA: Diagnosis not present

## 2024-02-12 DIAGNOSIS — L570 Actinic keratosis: Secondary | ICD-10-CM | POA: Diagnosis not present

## 2024-02-12 DIAGNOSIS — D692 Other nonthrombocytopenic purpura: Secondary | ICD-10-CM | POA: Diagnosis not present

## 2024-02-12 DIAGNOSIS — L820 Inflamed seborrheic keratosis: Secondary | ICD-10-CM | POA: Diagnosis not present

## 2024-02-12 DIAGNOSIS — L821 Other seborrheic keratosis: Secondary | ICD-10-CM | POA: Diagnosis not present

## 2024-02-16 ENCOUNTER — Ambulatory Visit: Payer: Medicare Other | Admitting: Physical Therapy

## 2024-02-16 ENCOUNTER — Encounter: Payer: Self-pay | Admitting: Physical Therapy

## 2024-02-16 DIAGNOSIS — M5459 Other low back pain: Secondary | ICD-10-CM

## 2024-02-16 DIAGNOSIS — M6281 Muscle weakness (generalized): Secondary | ICD-10-CM

## 2024-02-16 DIAGNOSIS — M5416 Radiculopathy, lumbar region: Secondary | ICD-10-CM | POA: Diagnosis not present

## 2024-02-16 NOTE — Therapy (Signed)
 OUTPATIENT PHYSICAL THERAPY THORACOLUMBAR TREATMENT  Patient Name: Dan Sanchez MRN: [id number] DOB:September 30, 1956, 68 y.o., male Today's Date: 02/16/2024  END OF SESSION:  PT End of Session - 02/16/24 0841     Visit Number 17    Date for PT Re-Evaluation 02/19/24    Authorization Type BC[employer]    PT Start Time [phone]    PT Stop Time 0928    PT Time Calculation (min) 47 min    Activity Tolerance Patient tolerated treatment well    Behavior During Therapy [facility] for tasks assessed/performed              Past Medical History:  Diagnosis Date   Allergy    CAD (coronary artery disease)    mild non-obstructive   GERD (gastroesophageal reflux disease)    Hyperlipidemia    S/P right rotator cuff repair    Sinusitis, bacterial    Urinary incontinence    Past Surgical History:  Procedure Laterality Date   COLONOSCOPY  04/08/2009   LEFT HEART CATHETERIZATION WITH CORONARY ANGIOGRAM N/A 08/13/2014   Procedure: LEFT HEART CATHETERIZATION WITH CORONARY ANGIOGRAM;  Surgeon: Kathleene Hazel, MD;  Location: [facility] CATH LAB;  Service: Cardiovascular;  Laterality: N/A;   LUMBAR LAMINECTOMY FOR EPIDURAL ABSCESS N/A 08/30/2023   Procedure: LUMBAR LAMINECTOMY LUMBAR THREE-LUMBAR FOUR, LUMBAR FOUR-LUMBAR FIVE WITH EVACUATION OF EPIDURAL ABSCESS;  Surgeon: Jadene Pierini, MD;  Location: [facility] OR;  Service: Neurosurgery;  Laterality: N/A;   SHOULDER ARTHROSCOPY WITH ROTATOR CUFF REPAIR Right 10/26/2017   Procedure: RIGHT SHOULDER ARTHROSCOPY WITH ROTATOR CUFF REPAIR AND DEBRIDEMENT;  Surgeon: Eugenia Mcalpine, MD;  Location: [facility];  Service: Orthopedics;  Laterality: Right;   SHOULDER SURGERY     Right shoulder    TEE WITHOUT CARDIOVERSION N/A 09/04/2023   Procedure: TRANSESOPHAGEAL ECHOCARDIOGRAM;  Surgeon: Pricilla Riffle, MD;  Location: [facility] INVASIVE CV LAB;  Service: Cardiovascular;  Laterality: N/A;   Patient Active Problem List   Diagnosis Date Noted   Epidural abscess  11/14/2023   MSSA bacteremia 08/31/2023   Septic arthritis of lumbar spine (HCC) 08/29/2023   Normocytic anemia 08/29/2023   Elevated LFTs 08/29/2023   Osteoarthritis of knee 03/01/2023   Impingement syndrome of right shoulder region 02/27/2023   Coronary artery disease involving native coronary artery of native heart without angina pectoris 12/15/2022   Spinal stenosis of lumbar region with neurogenic claudication 03/23/2021   Lumbar radiculopathy 02/04/2021   HTN (hypertension) 03/29/2019   Overweight (BMI 25.0-29.9) 03/29/2019   Nasal septal deviation 02/01/2019   Nasal turbinate hypertrophy 02/01/2019   S/P right rotator cuff repair 10/26/2017   Contact dermatitis 08/27/2015   HSV-1 (herpes simplex virus 1) infection 08/27/2015   Bee sting allergy 09/10/2014   Chest pain 08/11/2014   Allergic rhinitis 08/10/2014   Allergic conjunctivitis 02/20/2014   Insomnia 02/03/2014   GERD (gastroesophageal reflux disease) 02/03/2014   General medical examination 03/16/2012   Hyperlipidemia 12/24/2007   ERECTILE DYSFUNCTION 12/24/2007    PCP: Beverely Low, MD  REFERRING PROVIDER: Shon Baton, MD  REFERRING DIAG: lumbar radiculopathy  Rationale for Evaluation and Treatment: Rehabilitation  THERAPY DIAG:  Muscle weakness (generalized)  Radiculopathy, lumbar region  Other low back pain  ONSET DATE: 08/30/23  SUBJECTIVE:  SUBJECTIVE STATEMENT: Reports no pain just sore and stiff, has played golf, has played pickleball  PERTINENT HISTORY:  Lumbar laminectomy L3-5 08/30/23  PAIN:  Are you having pain? Yes: NPRS scale: 0/10 Pain location: just feel really stiff and a little sore Pain description: stiff and sore Aggravating factors: just get sore with activity Relieving factors: stretching,  PRECAUTIONS:  None  RED FLAGS: None   WEIGHT BEARING RESTRICTIONS: No  FALLS:  Has patient fallen in last 6 months? Yes, when legs were giving out  LIVING ENVIRONMENT: Lives with: lives with their family and lives with their spouse Lives in: House/apartment Stairs: Yes: Internal: 12 steps; bilateral but cannot reach both Has following equipment at home: None  OCCUPATION: sitting in sales, some driving  PLOF: Independent and yardwork, some pickleball 1 every month, golf every week  PATIENT GOALS: have less pain, move better, get my stamina and strength back  NEXT MD VISIT: none scheduled  OBJECTIVE:   DIAGNOSTIC FINDINGS:  Older X-ray with significant stenosis  COGNITION: Overall cognitive status: Within functional limits for tasks assessed     SENSATION: WFL  MUSCLE LENGTH: Very tight HS 55 degree SLR, right leg feels "more than left" Very tight ITB and piriformis mms + Thomas test bilateral, very tight quads  POSTURE: rounded shoulders, forward head, and decreased lumbar lordosis, slouched sitting posture  PALPATION: Tight with mild tenderness , scar is well healed  LUMBAR ROM:   AROM eval 12/08/23 12/15/23  Flexion Decreased 75%  Limited  25& Limited 25%  Extension Decreased 75%  Limited 50% Limited 50%  Right lateral flexion Decreased 75% Limited 25% WFL  Left lateral flexion Decreased 75% Limited 25% WFL  Right rotation  WFL   Left rotation  WFL    (Blank rows = not tested)  LOWER EXTREMITY ROM:     Stiff hips and HS  LOWER EXTREMITY MMT:   Right hip 4-/5, left hip 4+, knees 5-, ankles 4+  LUMBAR SPECIAL TESTS:  Straight leg raise test: Positive and Slump test: Negative    FUNCTIONAL TESTS:  Timed up and go (TUG): 10 seconds Patient does have right shoulder pain, I did a quick assessment of this, he has very weak RC bilaterally, ER was 3+/5.  He has right shoulder ROM that is limited with ER, flexion and abduction, negative impingement test, + empty can and +  Yergason's test.  Again very poor posture may influence the strain on the right biceps GAIT: Distance walked: 100 feet Assistive device utilized: None Level of assistance: Complete Independence Comments: forward flexed posture  TODAY'S TREATMENT:                                                                                                                              DATE:  02/16/24 Nustep level 5 x 6 minutes Calf stretch slant board Elliptical Level 5 x 3 minutes 15# straight arm pulls 25# AR press 20# and 42.5# farmer carry  single arm 1 lap each [employer] On upside down ball toss with weighted ball Leg press 60# x 10, 80# x 10, 100# x 10 Isometric abs 2x10 LE stretches passive  02/05/24 NuStep L5 x6 Elliptical L3 x 2 min HS curls 55lb 2x12 Leg Ext 15lb 2x12 Sit to stand 2x12 holding 20lb ball  50lb resisted side steps holding 20lb ball  Rows & Lats 55lb 2x12 AR press 20lb on airex x10 each  Slant board calf stretch   Passive stretch LE's, Piriformis, HS, K2C lower trunk rotations   01/29/24 NuStep L5 x 6 min Tmill push 20s x 3 S2S OHP 10lb 2x12 Leg press 90# 3x10 Rows & Lats 55lb 2x12 Resisted sides steps & backwards walking x5 each 50lb  AR Press 20lb 5 seconds hold x5   Passive stretch LE's, Piriformis, HS, K2C lower trunk rotations   Nustep level 6 x 4 minutes Bike level 5 x 5 minutes 3 power bursts Tmill push 20s x 3 Slant board calf stretch 15# straight arm pulls 3x10 25# AR press 3x10 15# hip extension 2x10 10# hip abduction 2x10 Leg press 90# 3x10 Lats 55# 2x10 Feet on ball bridge, isometric abs Passive stretch LE's  01/08/24 Bike level 5 x 5 minutes Tmill push 20 s x 3 Slant board stretch Seated row 55# 3x10 Lats 55# 3x10 AR press 20# 45# triceps cues for form and core Leg press 80# 3x10 Hip extension 20# 2x10 Hip abduction 15# 20# sit to stand 2 x10 Passive stretch HS, piriformis, Hip flexor and quads  01/01/24 NuStep L 5 x 6  min Shoulder Ext 10lb 2x10 Slant board Calf stretch 5x10'' Seated Rows 55lb 3x10 Lats 55lb 3x10 HS curls 45lb 2x10 Leg Ext 20lb 2x15  Bridge /w March 2x10 AR pre 20lb standing on airex HS, piriformis, single K2x, Double K2C, glute stretch, ITB  To improve tissue elasticity   12/22/23 NuStep L 5 x 6 min S2S OHP 10lb 2x12 Standing Rows 20lb 2x15 Leg press 80lb 2x12 Horiz shoulder abd blue 2x12 Cross body rotation w/ yellow wball 2x10 Seated Rows 55lb 3x10 Lat pulls 55lb 2x10 LE on Pball bridges, oblq, K2C HS, piriformis, single K2x, Double K2C, glute stretch, ITB  12/18/23 Bike L4 x6 min Seated Rows & Lats 45lb 2x15 Standing Rows 25lb 2x10  Leg press 80lb 2x10 Slant board calf stretch Horiz abd blue 2x10 20lb farmers carry  LE on Pball bridges, oblq, K2C HS, piriformis, single K2x, Double K2C, glute stretch, ITB  12/14/22 Bike L3.5 x6 min Shoulder ER red 2x10 Horiz Abd blue 2x10 Oblqs 15lb x10 each Shoulder Ext 15lb 3x10   S2S holding 20lb KB 2x12  HS, piriformis, single K2x, Double K2C, glute stretch, ITB Ionto patch 1mL dex R biceps tendon  12/11/23 Nustep level 6 x 6 minutes Rows & Lats 55lb 2x10  HS curls 45lb 2x15 Leg Ext 20lb 2x15  Bird dogs 2x5 each  S2S OHP 10lb Dumbbell 2x10 SL bridges 2x10  Dead bug LE only 3x5 each  HS, piriformis, single K2x, Double K2C, glute stretch, ITB  PATIENT EDUCATION:  Education details: HEP/POC Person educated: Patient Education method: Programmer, multimedia, Demonstration, Tactile cues, and Verbal cues Education comprehension: verbalized understanding  HOME EXERCISE PROGRAM: Access Code: [email or web address] URL: [email or web address] Date: 11/16/2023 Prepared by: Debroah Baller  Exercises - Supine Hamstring Stretch with Strap  - 1 x daily - 7 x weekly - 3 sets - 10 reps - Supine Bridge  - 1 x daily - 7 x weekly -  3 sets - 10 reps - Supine Figure 4 Piriformis Stretch  - 1 x daily - 7 x weekly - 3 sets - 10  reps  ASSESSMENT:  CLINICAL IMPRESSION: Patient is a 68  y.o. male who was seen today for physical therapy treatment for s/p lumbar laminectomy with septic arthritis on 08/30/23 past MRI's show significant stenosis. Pushing his core strength and stability with proprioceptive exercises, he tolerates well without any increase of pain his flexibility is much improved, has issues with right shoulder overhead activity, did add some ball slams and farmer carry today  OBJECTIVE IMPAIRMENTS: Abnormal gait, cardiopulmonary status limiting activity, decreased activity tolerance, decreased balance, decreased endurance, decreased mobility, difficulty walking, decreased ROM, decreased strength, increased muscle spasms, impaired flexibility, improper body mechanics, postural dysfunction, and pain.   REHAB POTENTIAL: Good  CLINICAL DECISION MAKING: Stable/uncomplicated   EVALUATION COMPLEXITY: Low   GOALS: Goals reviewed with patient? Yes  SHORT TERM GOALS: Target date: 11/28/23  Independent with initial HEP Goal status: Met 11/20/23  LONG TERM GOALS: Target date: 01/11/24  Independent with advanced HEP Goal status: progressing 01/22/24  2.  Decrease pain 50% Goal status: Met 12/08/23  3.  Increase lumbar ROM 25% Goal status: Met 12/07/22  4.  Increase SLR to 70 degrees Goal status: met 02/16/24 5.  Be able to play a round of golf without difficulty Goal status: progressing 01/22/24  PLAN:  PT FREQUENCY: 2x/week  PT DURATION: 12 weeks  PLANNED INTERVENTIONS: Therapeutic exercises, Therapeutic activity, Neuromuscular re-education, Balance training, Gait training, Patient/Family education, Self Care, Joint mobilization, Joint manipulation, Dry Needling, Electrical stimulation, Spinal manipulation, Spinal mobilization, Cryotherapy, Moist heat, Traction, Ultrasound, and Manual therapy.  PLAN FOR NEXT SESSION: wlook at his overall program and see if D/C is appropriate  Jearld Lesch,  PT 02/16/2024, 8:43 AM

## 2024-02-23 ENCOUNTER — Ambulatory Visit: Payer: Medicare Other | Admitting: Physical Therapy

## 2024-02-23 ENCOUNTER — Encounter: Payer: Self-pay | Admitting: Physical Therapy

## 2024-02-23 DIAGNOSIS — M5416 Radiculopathy, lumbar region: Secondary | ICD-10-CM

## 2024-02-23 DIAGNOSIS — M5459 Other low back pain: Secondary | ICD-10-CM

## 2024-02-23 DIAGNOSIS — M6281 Muscle weakness (generalized): Secondary | ICD-10-CM | POA: Diagnosis not present

## 2024-02-23 NOTE — Therapy (Signed)
 OUTPATIENT PHYSICAL THERAPY THORACOLUMBAR TREATMENT  Patient Name: Dan Sanchez MRN: [id number] DOB:[birth year], 68 y.o., male Today's Date: 02/23/2024  END OF SESSION:  PT End of Session - 02/23/24 0844     Visit Number 18    Authorization Type BCBS    PT Start Time 0840    PT Stop Time 0927    PT Time Calculation (min) 47 min    Activity Tolerance Patient tolerated treatment well    Behavior During Therapy [facility] for tasks assessed/performed              Past Medical History:  Diagnosis Date   Allergy    CAD (coronary artery disease)    mild non-obstructive   GERD (gastroesophageal reflux disease)    Hyperlipidemia    S/P right rotator cuff repair    Sinusitis, bacterial    Urinary incontinence    Past Surgical History:  Procedure Laterality Date   COLONOSCOPY  04/08/2009   LEFT HEART CATHETERIZATION WITH CORONARY ANGIOGRAM N/A 08/13/2014   Procedure: LEFT HEART CATHETERIZATION WITH CORONARY ANGIOGRAM;  Surgeon: Kathleene Hazel, MD;  Location: [facility] CATH LAB;  Service: Cardiovascular;  Laterality: N/A;   LUMBAR LAMINECTOMY FOR EPIDURAL ABSCESS N/A 08/30/2023   Procedure: LUMBAR LAMINECTOMY LUMBAR THREE-LUMBAR FOUR, LUMBAR FOUR-LUMBAR FIVE WITH EVACUATION OF EPIDURAL ABSCESS;  Surgeon: Jadene Pierini, MD;  Location: [facility] OR;  Service: Neurosurgery;  Laterality: N/A;   SHOULDER ARTHROSCOPY WITH ROTATOR CUFF REPAIR Right 10/26/2017   Procedure: RIGHT SHOULDER ARTHROSCOPY WITH ROTATOR CUFF REPAIR AND DEBRIDEMENT;  Surgeon: Eugenia Mcalpine, MD;  Location: [facility];  Service: Orthopedics;  Laterality: Right;   SHOULDER SURGERY     Right shoulder    TEE WITHOUT CARDIOVERSION N/A 09/04/2023   Procedure: TRANSESOPHAGEAL ECHOCARDIOGRAM;  Surgeon: Pricilla Riffle, MD;  Location: [facility] INVASIVE CV LAB;  Service: Cardiovascular;  Laterality: N/A;   Patient Active Problem List   Diagnosis Date Noted   Epidural abscess 11/14/2023   MSSA bacteremia 08/31/2023    Septic arthritis of lumbar spine (HCC) 08/29/2023   Normocytic anemia 08/29/2023   Elevated LFTs 08/29/2023   Osteoarthritis of knee 03/01/2023   Impingement syndrome of right shoulder region 02/27/2023   Coronary artery disease involving native coronary artery of native heart without angina pectoris 12/15/2022   Spinal stenosis of lumbar region with neurogenic claudication 03/23/2021   Lumbar radiculopathy 02/04/2021   HTN (hypertension) 03/29/2019   Overweight (BMI 25.0-29.9) 03/29/2019   Nasal septal deviation 02/01/2019   Nasal turbinate hypertrophy 02/01/2019   S/P right rotator cuff repair 10/26/2017   Contact dermatitis 08/27/2015   HSV-1 (herpes simplex virus 1) infection 08/27/2015   Bee sting allergy 09/10/2014   Chest pain 08/11/2014   Allergic rhinitis 08/10/2014   Allergic conjunctivitis 02/20/2014   Insomnia 02/03/2014   GERD (gastroesophageal reflux disease) 02/03/2014   General medical examination 03/16/2012   Hyperlipidemia 12/24/2007   ERECTILE DYSFUNCTION 12/24/2007    PCP: Beverely Low, MD  REFERRING PROVIDER: Shon Baton, MD  REFERRING DIAG: lumbar radiculopathy  Rationale for Evaluation and Treatment: Rehabilitation  THERAPY DIAG:  Muscle weakness (generalized)  Radiculopathy, lumbar region  Other low back pain  ONSET DATE: 08/30/23  SUBJECTIVE:  SUBJECTIVE STATEMENT: Reports that his low back gets fatigued and sore with activity, he sits down and it will go away PERTINENT HISTORY:  Lumbar laminectomy L3-5 08/30/23  PAIN:  Are you having pain? Yes: NPRS scale: 0/10 Pain location: just feel really stiff and a little sore Pain description: stiff and sore Aggravating factors: just get sore with activity Relieving factors: stretching,  PRECAUTIONS: None  RED  FLAGS: None   WEIGHT BEARING RESTRICTIONS: No  FALLS:  Has patient fallen in last 6 months? Yes, when legs were giving out  LIVING ENVIRONMENT: Lives with: lives with their family and lives with their spouse Lives in: House/apartment Stairs: Yes: Internal: 12 steps; bilateral but cannot reach both Has following equipment at home: None  OCCUPATION: sitting in sales, some driving  PLOF: Independent and yardwork, some pickleball 1 every month, golf every week  PATIENT GOALS: have less pain, move better, get my stamina and strength back  NEXT MD VISIT: none scheduled  OBJECTIVE:   DIAGNOSTIC FINDINGS:  Older X-ray with significant stenosis  COGNITION: Overall cognitive status: Within functional limits for tasks assessed     SENSATION: WFL  MUSCLE LENGTH: Very tight HS 55 degree SLR, right leg feels "more than left" Very tight ITB and piriformis mms + Thomas test bilateral, very tight quads  POSTURE: rounded shoulders, forward head, and decreased lumbar lordosis, slouched sitting posture  PALPATION: Tight with mild tenderness , scar is well healed  LUMBAR ROM:   AROM eval 12/08/23 12/15/23  Flexion Decreased 75%  Limited  25& Limited 25%  Extension Decreased 75%  Limited 50% Limited 50%  Right lateral flexion Decreased 75% Limited 25% WFL  Left lateral flexion Decreased 75% Limited 25% WFL  Right rotation  WFL   Left rotation  WFL    (Blank rows = not tested)  LOWER EXTREMITY ROM:     Stiff hips and HS  LOWER EXTREMITY MMT:   Right hip 4-/5, left hip 4+, knees 5-, ankles 4+  LUMBAR SPECIAL TESTS:  Straight leg raise test: Positive and Slump test: Negative    FUNCTIONAL TESTS:  Timed up and go (TUG): 10 seconds Patient does have right shoulder pain, I did a quick assessment of this, he has very weak RC bilaterally, ER was 3+/5.  He has right shoulder ROM that is limited with ER, flexion and abduction, negative impingement test, + empty can and + Yergason's  test.  Again very poor posture may influence the strain on the right biceps GAIT: Distance walked: 100 feet Assistive device utilized: None Level of assistance: Complete Independence Comments: forward flexed posture  TODAY'S TREATMENT:                                                                                                                              DATE:  02/23/24 Nustep level 5 x 5 minutes Elliptical level 4 x 3 minutes Slant board stretch 15# straight arm pulls 15# chest press on  airex 10# bird dog rows 80# leg press 25# AR press 15# hip extension and abduction LE stretches On bosu weighted ball toss  02/16/24 Nustep level 5 x 6 minutes Calf stretch slant board Elliptical Level 5 x 3 minutes 15# straight arm pulls 25# AR press 20# and 42.5# farmer carry single arm 1 lap each [employer] On upside down ball toss with weighted ball Leg press 60# x 10, 80# x 10, 100# x 10 Isometric abs 2x10 LE stretches passive  02/05/24 NuStep L5 x6 Elliptical L3 x 2 min HS curls 55lb 2x12 Leg Ext 15lb 2x12 Sit to stand 2x12 holding 20lb ball  50lb resisted side steps holding 20lb ball  Rows & Lats 55lb 2x12 AR press 20lb on airex x10 each  Slant board calf stretch   Passive stretch LE's, Piriformis, HS, K2C lower trunk rotations   01/29/24 NuStep L5 x 6 min Tmill push 20s x 3 S2S OHP 10lb 2x12 Leg press 90# 3x10 Rows & Lats 55lb 2x12 Resisted sides steps & backwards walking x5 each 50lb  AR Press 20lb 5 seconds hold x5   Passive stretch LE's, Piriformis, HS, K2C lower trunk rotations   Nustep level 6 x 4 minutes Bike level 5 x 5 minutes 3 power bursts Tmill push 20s x 3 Slant board calf stretch 15# straight arm pulls 3x10 25# AR press 3x10 15# hip extension 2x10 10# hip abduction 2x10 Leg press 90# 3x10 Lats 55# 2x10 Feet on ball bridge, isometric abs Passive stretch LE's  01/08/24 Bike level 5 x 5 minutes Tmill push 20 s x 3 Slant board stretch Seated  row 55# 3x10 Lats 55# 3x10 AR press 20# 45# triceps cues for form and core Leg press 80# 3x10 Hip extension 20# 2x10 Hip abduction 15# 20# sit to stand 2 x10 Passive stretch HS, piriformis, Hip flexor and quads  01/01/24 NuStep L 5 x 6 min Shoulder Ext 10lb 2x10 Slant board Calf stretch 5x10'' Seated Rows 55lb 3x10 Lats 55lb 3x10 HS curls 45lb 2x10 Leg Ext 20lb 2x15  Bridge /w March 2x10 AR pre 20lb standing on airex HS, piriformis, single K2x, Double K2C, glute stretch, ITB  To improve tissue elasticity   12/22/23 NuStep L 5 x 6 min S2S OHP 10lb 2x12 Standing Rows 20lb 2x15 Leg press 80lb 2x12 Horiz shoulder abd blue 2x12 Cross body rotation w/ yellow wball 2x10 Seated Rows 55lb 3x10 Lat pulls 55lb 2x10 LE on Pball bridges, oblq, K2C HS, piriformis, single K2x, Double K2C, glute stretch, ITB  12/18/23 Bike L4 x6 min Seated Rows & Lats 45lb 2x15 Standing Rows 25lb 2x10  Leg press 80lb 2x10 Slant board calf stretch Horiz abd blue 2x10 20lb farmers carry  LE on Pball bridges, oblq, K2C HS, piriformis, single K2x, Double K2C, glute stretch, ITB  12/14/22 Bike L3.5 x6 min Shoulder ER red 2x10 Horiz Abd blue 2x10 Oblqs 15lb x10 each Shoulder Ext 15lb 3x10   S2S holding 20lb KB 2x12  HS, piriformis, single K2x, Double K2C, glute stretch, ITB Ionto patch 1mL dex R biceps tendon  12/11/23 Nustep level 6 x 6 minutes Rows & Lats 55lb 2x10  HS curls 45lb 2x15 Leg Ext 20lb 2x15  Bird dogs 2x5 each  S2S OHP 10lb Dumbbell 2x10 SL bridges 2x10  Dead bug LE only 3x5 each  HS, piriformis, single K2x, Double K2C, glute stretch, ITB  PATIENT EDUCATION:  Education details: HEP/POC Person educated: Patient Education method: Programmer, multimedia, Demonstration, Actor cues, and Verbal cues Education  comprehension: verbalized understanding  HOME EXERCISE PROGRAM: Access Code: [email or web address] URL: [email or web address] Date: 11/16/2023 Prepared by: Debroah Baller  Exercises - Supine Hamstring Stretch with Strap  - 1 x daily - 7 x weekly - 3 sets - 10 reps - Supine Bridge  - 1 x daily - 7 x weekly - 3 sets - 10 reps - Supine Figure 4 Piriformis Stretch  - 1 x daily - 7 x weekly - 3 sets - 10 reps  ASSESSMENT:  CLINICAL IMPRESSION: Patient is a 68  y.o. male who was seen today for physical therapy treatment for s/p lumbar laminectomy with septic arthritis on 08/30/23 past MRI's show significant stenosis. Reviewed HEP, stretching and flexibility, posture and body mechanics  OBJECTIVE IMPAIRMENTS: Abnormal gait, cardiopulmonary status limiting activity, decreased activity tolerance, decreased balance, decreased endurance, decreased mobility, difficulty walking, decreased ROM, decreased strength, increased muscle spasms, impaired flexibility, improper body mechanics, postural dysfunction, and pain.   REHAB POTENTIAL: Good  CLINICAL DECISION MAKING: Stable/uncomplicated   EVALUATION COMPLEXITY: Low   GOALS: Goals reviewed with patient? Yes  SHORT TERM GOALS: Target date: 11/28/23  Independent with initial HEP Goal status: Met 11/20/23  LONG TERM GOALS: Target date: 01/11/24  Independent with advanced HEP Goal status: met 02/23/24  2.  Decrease pain 50% Goal status: Met 12/08/23  3.  Increase lumbar ROM 25% Goal status: Met 12/07/22  4.  Increase SLR to 70 degrees Goal status: met 02/16/24 5.  Be able to play a round of golf without difficulty Goal status: met 02/23/24  PLAN:  PT FREQUENCY: 2x/week  PT DURATION: 12 weeks  PLANNED INTERVENTIONS: Therapeutic exercises, Therapeutic activity, Neuromuscular re-education, Balance training, Gait training, Patient/Family education, Self Care, Joint mobilization, Joint manipulation, Dry Needling, Electrical stimulation, Spinal manipulation, Spinal mobilization, Cryotherapy, Moist heat, Traction, Ultrasound, and Manual therapy.  PLAN FOR NEXT SESSION: Goals met D/C  Jearld Lesch, PT 02/23/2024, 8:44 AM

## 2024-03-04 DIAGNOSIS — Z6827 Body mass index (BMI) 27.0-27.9, adult: Secondary | ICD-10-CM | POA: Diagnosis not present

## 2024-03-04 DIAGNOSIS — G062 Extradural and subdural abscess, unspecified: Secondary | ICD-10-CM | POA: Diagnosis not present

## 2024-03-06 ENCOUNTER — Other Ambulatory Visit (HOSPITAL_BASED_OUTPATIENT_CLINIC_OR_DEPARTMENT_OTHER): Payer: Self-pay

## 2024-03-06 MED ORDER — CELECOXIB 200 MG PO CAPS
200.0000 mg | ORAL_CAPSULE | Freq: Every day | ORAL | 2 refills | Status: DC
  Filled 2024-03-06: qty 30, 30d supply, fill #0
  Filled 2024-04-04 (×2): qty 30, 30d supply, fill #1
  Filled 2024-04-29: qty 30, 30d supply, fill #2

## 2024-04-04 ENCOUNTER — Other Ambulatory Visit: Payer: Self-pay

## 2024-04-04 ENCOUNTER — Other Ambulatory Visit (HOSPITAL_BASED_OUTPATIENT_CLINIC_OR_DEPARTMENT_OTHER): Payer: Self-pay

## 2024-04-04 ENCOUNTER — Ambulatory Visit (INDEPENDENT_AMBULATORY_CARE_PROVIDER_SITE_OTHER): Admitting: Family Medicine

## 2024-04-04 ENCOUNTER — Encounter: Payer: Self-pay | Admitting: Family Medicine

## 2024-04-04 VITALS — BP 155/70 | HR 70 | Temp 98.7°F | Ht 70.0 in | Wt 200.4 lb

## 2024-04-04 DIAGNOSIS — H66001 Acute suppurative otitis media without spontaneous rupture of ear drum, right ear: Secondary | ICD-10-CM | POA: Diagnosis not present

## 2024-04-04 MED ORDER — AMOXICILLIN 875 MG PO TABS
875.0000 mg | ORAL_TABLET | Freq: Two times a day (BID) | ORAL | 0 refills | Status: AC
  Filled 2024-04-04: qty 20, 10d supply, fill #0

## 2024-04-04 NOTE — Progress Notes (Signed)
   Subjective:    Patient ID: Dan Sanchez, male    DOB: [birth year], 68 y.o.   MRN: [id number]  HPI URI- sxs started last week while in [place].  Started w/ cough.  This has moved into his chest.  Now having congestion of R ear and sinuses.  No fever.  Denies headache, just 'clogged'.  No tooth pain.  No drainage from ears.  Cough is productive of green sputum.   Review of Systems For ROS see HPI     Objective:   Physical Exam Vitals reviewed.  Constitutional:      General: He is not in acute distress.    Appearance: Normal appearance. He is not ill-appearing.  HENT:     Head: Normocephalic and atraumatic.     Right Ear: A middle ear effusion is present. Tympanic membrane is erythematous.     Left Ear: Tympanic membrane and ear canal normal.     Nose: Congestion present. No rhinorrhea.     Comments: No TTP over frontal or maxillary sinuses    Mouth/Throat:     Mouth: Mucous membranes are moist.     Pharynx: No oropharyngeal exudate or posterior oropharyngeal erythema.  Cardiovascular:     Rate and Rhythm: Normal rate and regular rhythm.  Pulmonary:     Effort: Pulmonary effort is normal. No respiratory distress.     Breath sounds: No wheezing or rhonchi.  Musculoskeletal:     Cervical back: Neck supple.  Lymphadenopathy:     Cervical: No cervical adenopathy.  Neurological:     General: No focal deficit present.     Mental Status: He is alert and oriented to person, place, and time.  Psychiatric:        Mood and Affect: Mood normal.        Behavior: Behavior normal.        Thought Content: Thought content normal.           Assessment & Plan:  R OM- new.  [name] sxs and PE consistent w/ infxn.  Start Amoxicillin .  Reviewed supportive care and red flags that should prompt return.  Pt expressed understanding and is in agreement w/ plan.

## 2024-04-04 NOTE — Patient Instructions (Signed)
 Follow up as needed or as scheduled START the Amoxicillin  twice daily- take w/ food Drink LOTS of fluids Tylenol  or ibuprofen for pain Call with any questions or concerns Hang in there!!

## 2024-04-08 ENCOUNTER — Other Ambulatory Visit (HOSPITAL_BASED_OUTPATIENT_CLINIC_OR_DEPARTMENT_OTHER): Payer: Self-pay

## 2024-04-08 DIAGNOSIS — C44329 Squamous cell carcinoma of skin of other parts of face: Secondary | ICD-10-CM | POA: Diagnosis not present

## 2024-04-08 MED ORDER — DOXYCYCLINE HYCLATE 100 MG PO CAPS
100.0000 mg | ORAL_CAPSULE | Freq: Two times a day (BID) | ORAL | 0 refills | Status: DC
  Filled 2024-04-08: qty 10, 5d supply, fill #0

## 2024-04-09 ENCOUNTER — Ambulatory Visit (INDEPENDENT_AMBULATORY_CARE_PROVIDER_SITE_OTHER): Admitting: Family Medicine

## 2024-04-09 ENCOUNTER — Ambulatory Visit: Payer: Self-pay

## 2024-04-09 ENCOUNTER — Encounter: Payer: Self-pay | Admitting: Family Medicine

## 2024-04-09 ENCOUNTER — Other Ambulatory Visit (HOSPITAL_BASED_OUTPATIENT_CLINIC_OR_DEPARTMENT_OTHER): Payer: Self-pay

## 2024-04-09 VITALS — BP 132/64 | HR 68 | Temp 98.0°F | Ht 70.0 in | Wt 199.1 lb

## 2024-04-09 DIAGNOSIS — H60501 Unspecified acute noninfective otitis externa, right ear: Secondary | ICD-10-CM | POA: Diagnosis not present

## 2024-04-09 MED ORDER — NEOMYCIN-POLYMYXIN-HC 3.5-10000-1 OT SUSP
3.0000 [drp] | Freq: Three times a day (TID) | OTIC | 0 refills | Status: DC
  Filled 2024-04-09: qty 10, 10d supply, fill #0

## 2024-04-09 NOTE — Patient Instructions (Signed)
 Follow up as needed or as scheduled FINISH the Amoxicillin  twice daily for the middle ear infection (traditional ear infection, likely worse from flying) ADD the ear drops to treat the outer ear infection (swimmer's ear- likely from vacation) Try and avoid picking or scratching at your ear Call with any questions or concerns Hang in there!!!

## 2024-04-09 NOTE — Progress Notes (Signed)
   Subjective:    Patient ID: Dan Sanchez, male    DOB: 05-22-1956, 68 y.o.   MRN: [id number]  HPI R ear pain- pt was seen on 5/8 and dx'd w/ OM and started on Amoxicillin .  Pt has had bloody drainage from R ear the last 2 mornings.  Has had swelling and ear feels clogged.  No fevers.   Review of Systems For ROS see HPI     Objective:   Physical Exam Vitals reviewed.  Constitutional:      General: He is not in acute distress.    Appearance: Normal appearance. He is not ill-appearing.  HENT:     Head: Normocephalic and atraumatic.     Right Ear: Drainage (purulent drainage in canal) and swelling (of EAC) present. A middle ear effusion (mild- improved since last week) is present. Tympanic membrane is not erythematous or bulging.     Left Ear: Tympanic membrane, ear canal and external ear normal.  Musculoskeletal:     Cervical back: Neck supple.  Lymphadenopathy:     Cervical: Cervical adenopathy present.  Skin:    General: Skin is warm and dry.  Neurological:     General: No focal deficit present.     Mental Status: He is alert and oriented to person, place, and time.           Assessment & Plan:  Otitis externa- new.  Purulent drainage in canal.  Likely from swimming in the [place].  Add Cortisporin Otic to current Amoxicillin  regimen (TM and middle ear effusion both improved).  Pt expressed understanding and is in agreement w/ plan.

## 2024-04-09 NOTE — Telephone Encounter (Signed)
 Copied from CRM [id number]. Topic: Clinical - Red Word Triage >> Apr 09, 2024  8:05 AM Kita Perish H wrote: [facility] that prompted transfer to Nurse Triage: Right side of face swollen under ear at night blood coming from somewhere in ear.   Chief Complaint: Facial swelling with ear pain Symptoms: swelling around ear Frequency: constant Pertinent Negatives: Patient denies pain Disposition: [] ED /[] Urgent Care (no appt availability in office) / [x] Appointment(In office/virtual)/ []  [facility]/ [] Home Care/ [] Refused Recommended Disposition /[] [facility]/ []  Follow-up with PCP Additional Notes: Patient seen on 5/8 diagnosed with ear infection and started on antibiotics. Pt reports waking up this morning with swelling now around the ear and possible blood discharging from ear. Follow-up appt scheduled with PCP this morning at 11am  Reason for Disposition  Face swelling is painful to touch  Answer Assessment - Initial Assessment Questions 1. ONSET: "When did the swelling start?" (e.g., minutes, hours, days)     Started this morning  2. LOCATION: "What part of the face is swollen?"     Right side, underneath the ear  3. SEVERITY: "How swollen is it?"     .  4. ITCHING: "Is there any itching?" If Yes, ask: "How much?"   (Scale 1-10; mild, moderate or severe)     No ithcing  5. PAIN: "Is the swelling painful to touch?" If Yes, ask: "How painful is it?"   (Scale 1-10; mild, moderate or severe)   - NONE (0): no pain   - MILD (1-3): doesn't interfere with normal activities    - MODERATE (4-7): interferes with normal activities or awakens from sleep    - SEVERE (8-10): excruciating pain, unable to do any normal activities      No pain   6. FEVER: "Do you have a fever?" If Yes, ask: "What is it, how was it measured, and when did it start?"      No fever  7. CAUSE: "What do you think is causing the face swelling?"     Possible sinus infection  8. RECURRENT SYMPTOM:  "Have you had face swelling before?" If Yes, ask: "When was the last time?" "What happened that time?"     Yes, it was sinus infection  9. OTHER SYMPTOMS: "Do you have any other symptoms?" (e.g., toothache, leg swelling)     Blood coming from ear at night, productive cough  10. PREGNANCY: "Is there any chance you are pregnant?" "When was your last menstrual period?"       N/a  Protocols used: Face Swelling-A-AH

## 2024-04-15 ENCOUNTER — Encounter: Payer: Self-pay | Admitting: Family Medicine

## 2024-04-16 ENCOUNTER — Other Ambulatory Visit: Payer: Self-pay | Admitting: Family Medicine

## 2024-04-16 ENCOUNTER — Other Ambulatory Visit (HOSPITAL_BASED_OUTPATIENT_CLINIC_OR_DEPARTMENT_OTHER): Payer: Self-pay

## 2024-04-16 MED ORDER — OMEPRAZOLE 20 MG PO CPDR
20.0000 mg | DELAYED_RELEASE_CAPSULE | Freq: Every day | ORAL | 1 refills | Status: DC
  Filled 2024-04-16: qty 90, 90d supply, fill #0
  Filled 2024-07-15: qty 90, 90d supply, fill #1

## 2024-04-17 ENCOUNTER — Other Ambulatory Visit (HOSPITAL_BASED_OUTPATIENT_CLINIC_OR_DEPARTMENT_OTHER): Payer: Self-pay

## 2024-04-17 ENCOUNTER — Encounter: Payer: Self-pay | Admitting: Family Medicine

## 2024-04-17 ENCOUNTER — Ambulatory Visit (INDEPENDENT_AMBULATORY_CARE_PROVIDER_SITE_OTHER): Admitting: Family Medicine

## 2024-04-17 VITALS — BP 120/60 | HR 79 | Temp 97.9°F | Ht 70.0 in | Wt 200.0 lb

## 2024-04-17 DIAGNOSIS — R0981 Nasal congestion: Secondary | ICD-10-CM

## 2024-04-17 MED ORDER — PREDNISONE 20 MG PO TABS
40.0000 mg | ORAL_TABLET | Freq: Every day | ORAL | 0 refills | Status: AC
Start: 2024-04-17 — End: 2024-04-22
  Filled 2024-04-17: qty 10, 5d supply, fill #0

## 2024-04-17 NOTE — Progress Notes (Signed)
   Subjective:    Patient ID: Dan Sanchez, male    DOB: 05-07-1956, 68 y.o.   MRN: [id number]  HPI Congestion- pt was seen 1 week ago and had R OE after being dx'd the week before w/ OM.  Pt reports feeling better but still has 'goop' coming out of R ear.  Continues to have nasal congestion, cough.   Review of Systems For ROS see HPI     Objective:   Physical Exam Vitals reviewed.  Constitutional:      General: He is not in acute distress.    Appearance: Normal appearance. He is not ill-appearing.  HENT:     Head: Normocephalic and atraumatic.     Right Ear: Tympanic membrane and ear canal normal.     Left Ear: Tympanic membrane and ear canal normal.     Nose: Congestion present. No rhinorrhea.     Mouth/Throat:     Mouth: Mucous membranes are moist.     Pharynx: No oropharyngeal exudate or posterior oropharyngeal erythema.  Eyes:     Extraocular Movements: Extraocular movements intact.     Conjunctiva/sclera: Conjunctivae normal.  Cardiovascular:     Rate and Rhythm: Normal rate and regular rhythm.     Pulses: Normal pulses.  Pulmonary:     Effort: Pulmonary effort is normal. No respiratory distress.     Breath sounds: No wheezing or rhonchi.  Musculoskeletal:     Cervical back: Neck supple.  Lymphadenopathy:     Cervical: No cervical adenopathy.  Skin:    General: Skin is warm and dry.  Neurological:     General: No focal deficit present.     Mental Status: He is alert and oriented to person, place, and time.  Psychiatric:        Mood and Affect: Mood normal.        Behavior: Behavior normal.           Assessment & Plan:  Congestion- new.  Reassured pt that both ears look good- OM has resolved, OE much improved.  Suspect this is mostly allergy related- he takes Allegra daily.  Encouraged him to switch to Zyrtec temporarily to improve effectiveness.  Will start Prednisone  to improve congestion and inflammation.  Pt expressed understanding and is in agreement  w/ plan.

## 2024-04-17 NOTE — Patient Instructions (Addendum)
 Follow up as needed or as scheduled START the Prednisone - 2 tabs at the same time x5 days to improve inflammation and congestion Consider switching to Zyrtec (Cetirizine) for a month or so to boost the effectiveness of your allergy medication Your ears both look great!  So things are definitely improving Call with any questions or concerns Stay Safe!  Stay Healthy! Happy Memorial Day!!

## 2024-04-18 ENCOUNTER — Other Ambulatory Visit (HOSPITAL_BASED_OUTPATIENT_CLINIC_OR_DEPARTMENT_OTHER): Payer: Self-pay

## 2024-05-13 ENCOUNTER — Ambulatory Visit: Admitting: Internal Medicine

## 2024-05-13 ENCOUNTER — Encounter: Payer: Self-pay | Admitting: Internal Medicine

## 2024-05-13 ENCOUNTER — Other Ambulatory Visit: Payer: Self-pay

## 2024-05-13 VITALS — BP 133/72 | HR 74 | Resp 16 | Ht 70.0 in | Wt 201.2 lb

## 2024-05-13 DIAGNOSIS — G061 Intraspinal abscess and granuloma: Secondary | ICD-10-CM | POA: Diagnosis not present

## 2024-05-13 DIAGNOSIS — B9561 Methicillin susceptible Staphylococcus aureus infection as the cause of diseases classified elsewhere: Secondary | ICD-10-CM | POA: Diagnosis not present

## 2024-05-13 NOTE — Progress Notes (Signed)
 RFV: follow up hx of MSSA bacteremia, epidural abscess  Patient ID: Dan Sanchez, male   DOB: 1956/04/12, 68 y.o.   MRN: [id number]  HPI Dan Sanchez is a 68yo M with hx of MSSA epidural abscess, who has completed abtx in January 2025. He denies any back pain overall doing well. He was on vacation recently and even  Did a cliff jump in [place]. And recently was treated for sinus-ear infection now improved  Outpatient Encounter Medications as of 05/13/2024  Medication Sig   acetaminophen  (TYLENOL ) 325 MG tablet Take 2 tablets (650 mg total) by mouth every 8 (eight) hours as needed for mild pain.   amLODipine  (NORVASC ) 10 MG tablet Take 1 tablet (10 mg total) by mouth daily.   ascorbic acid  (VITAMIN C ) 1000 MG tablet Take by mouth.   aspirin  EC 81 MG tablet Take 1 tablet (81 mg total) by mouth daily. Swallow whole.   celecoxib  (CELEBREX ) 200 MG capsule Take 1 capsule (200 mg total) by mouth 2 (two) times daily.   celecoxib  (CELEBREX ) 200 MG capsule Take 1 capsule (200 mg total) by mouth daily with food.   fexofenadine (ALLEGRA) 180 MG tablet Take 180 mg by mouth daily.   fluticasone  (FLONASE ) 50 MCG/ACT nasal spray Place 2 sprays into both nostrils daily.   neomycin -polymyxin-hydrocortisone (CORTISPORIN ) 3.5-10000-1 OTIC suspension Place 3 drops into the right ear 3 (three) times daily. Use up to 10 days   Omega-3 Fatty Acids (FISH OIL) 1200 MG CAPS Take 2,400 mg by mouth 2 (two) times daily.   omeprazole  (PRILOSEC) 20 MG capsule Take 1 capsule (20 mg total) by mouth daily.   rosuvastatin  (CRESTOR ) 40 MG tablet Take 1 tablet (40 mg total) by mouth daily.   rosuvastatin  (CRESTOR ) 40 MG tablet Take 1 tablet (40 mg total) by mouth daily.   No facility-administered encounter medications on file as of 05/13/2024.     Patient Active Problem List   Diagnosis Date Noted   Epidural abscess 11/14/2023   MSSA bacteremia 08/31/2023   Septic arthritis of lumbar spine (HCC) 08/29/2023   Normocytic anemia  08/29/2023   Elevated LFTs 08/29/2023   Osteoarthritis of knee 03/01/2023   Impingement syndrome of right shoulder region 02/27/2023   Coronary artery disease involving native coronary artery of native heart without angina pectoris 12/15/2022   Spinal stenosis of lumbar region with neurogenic claudication 03/23/2021   Lumbar radiculopathy 02/04/2021   HTN (hypertension) 03/29/2019   Overweight (BMI 25.0-29.9) 03/29/2019   Nasal septal deviation 02/01/2019   Nasal turbinate hypertrophy 02/01/2019   S/P right rotator cuff repair 10/26/2017   Contact dermatitis 08/27/2015   HSV-1 (herpes simplex virus 1) infection 08/27/2015   Bee sting allergy 09/10/2014   Chest pain 08/11/2014   Allergic rhinitis 08/10/2014   Allergic conjunctivitis 02/20/2014   Insomnia 02/03/2014   GERD (gastroesophageal reflux disease) 02/03/2014   General medical examination 03/16/2012   Hyperlipidemia 12/24/2007   ERECTILE DYSFUNCTION 12/24/2007     Health Maintenance Due  Topic Date Due   Pneumococcal Vaccine: 50+ Years (1 of 2 - PCV) Never done   Zoster Vaccines- Shingrix (1 of 2) Never done   COVID-19 Vaccine (4 - 2024-25 season) 07/30/2023     Review of Systems 12 point ros is otherwise negative Physical Exam   BP 133/72   Pulse 74   Resp 16   Ht 5' 10 (1.778 m)   Wt 201 lb 3.2 oz (91.3 kg)   SpO2 97%   BMI 28.87 kg/m  Physical Exam  Constitutional: He is oriented to person, place, and time. He appears well-developed and well-nourished. No distress.  HENT:  Mouth/Throat: Oropharynx is clear and moist. No oropharyngeal exudate.  Cardiovascular: Normal rate, regular rhythm and normal heart sounds. Exam reveals no gallop and no friction rub.  No murmur heard.  Pulmonary/Chest: Effort normal and breath sounds normal. No respiratory distress. He has no wheezes.  Abdominal: Soft. Bowel sounds are normal. He exhibits no distension. There is no tenderness.  Lymphadenopathy:  He has no cervical  adenopathy.  Neurological: He is alert and oriented to person, place, and time.  Skin: Skin is warm and dry. No rash noted. No erythema.  Psychiatric: He has a normal mood and affect. His behavior is normal.    CBC Lab Results  Component Value Date   WBC 10.5 09/04/2023   RBC 3.43 (L) 09/04/2023   HGB 9.8 (L) 09/04/2023   HCT 30.2 (L) 09/04/2023   PLT 407 (H) 09/04/2023   MCV 88.0 09/04/2023   MCH 28.6 09/04/2023   MCHC 32.5 09/04/2023   RDW 12.6 09/04/2023   LYMPHSABS 1.0 08/29/2023   MONOABS 1.0 08/29/2023   EOSABS 0.0 08/29/2023    BMET Lab Results  Component Value Date   NA 136 09/04/2023   K 3.6 09/04/2023   CL 101 09/04/2023   CO2 26 09/04/2023   GLUCOSE 127 (H) 09/04/2023   BUN 9 09/04/2023   CREATININE 0.77 09/04/2023   CALCIUM  8.2 (L) 09/04/2023   GFRNONAA >60 09/04/2023   Lab Results  Component Value Date   CRP <3.0 05/13/2024   Lab Results  Component Value Date   ESRSEDRATE 2 05/13/2024      Assessment and Plan Hx of MSSA epidural abscess= we will Check inflammatory markers, and then have PCP do Q 6 wk  Rtc prn  CC; tabori

## 2024-05-14 LAB — C-REACTIVE PROTEIN: CRP: <3 mg/L (ref <8.0)

## 2024-05-14 LAB — SEDIMENTATION RATE: Sed Rate: 2 mm/h (ref 0–20)

## 2024-05-20 ENCOUNTER — Other Ambulatory Visit (HOSPITAL_BASED_OUTPATIENT_CLINIC_OR_DEPARTMENT_OTHER): Payer: Self-pay

## 2024-05-20 ENCOUNTER — Other Ambulatory Visit: Payer: Self-pay | Admitting: Family Medicine

## 2024-05-21 DIAGNOSIS — M1711 Unilateral primary osteoarthritis, right knee: Secondary | ICD-10-CM | POA: Diagnosis not present

## 2024-05-22 ENCOUNTER — Other Ambulatory Visit (HOSPITAL_BASED_OUTPATIENT_CLINIC_OR_DEPARTMENT_OTHER): Payer: Self-pay

## 2024-05-28 ENCOUNTER — Other Ambulatory Visit (HOSPITAL_BASED_OUTPATIENT_CLINIC_OR_DEPARTMENT_OTHER): Payer: Self-pay

## 2024-05-28 ENCOUNTER — Other Ambulatory Visit (HOSPITAL_COMMUNITY): Payer: Self-pay

## 2024-05-28 MED ORDER — CELECOXIB 200 MG PO CAPS
200.0000 mg | ORAL_CAPSULE | Freq: Every day | ORAL | 5 refills | Status: DC
  Filled 2024-05-28: qty 30, 30d supply, fill #0
  Filled 2024-07-01: qty 30, 30d supply, fill #1
  Filled 2024-07-30: qty 30, 30d supply, fill #2
  Filled 2024-09-02: qty 30, 30d supply, fill #3
  Filled 2024-09-30: qty 30, 30d supply, fill #4
  Filled 2024-10-31: qty 30, 30d supply, fill #5

## 2024-06-01 ENCOUNTER — Encounter: Payer: Self-pay | Admitting: Family Medicine

## 2024-06-03 ENCOUNTER — Other Ambulatory Visit (HOSPITAL_BASED_OUTPATIENT_CLINIC_OR_DEPARTMENT_OTHER): Payer: Self-pay

## 2024-06-03 MED ORDER — VALACYCLOVIR HCL 1 G PO TABS
1000.0000 mg | ORAL_TABLET | Freq: Two times a day (BID) | ORAL | 0 refills | Status: AC
  Filled 2024-06-03: qty 60, 30d supply, fill #0

## 2024-06-03 NOTE — Telephone Encounter (Signed)
 Requested Prescriptions   Pending Prescriptions Disp Refills   valACYclovir  (VALTREX ) 1000 MG tablet 60 tablet 0    Sig: Take 1 tablet (1,000 mg total) by mouth 2 (two) times daily. At the sight of a cold sore     Date of patient request: 06/03/2024 Last office visit: 04/17/2024 Upcoming visit: Visit date not found Date of last refill: 10/04/2019 Last refill amount: 60

## 2024-06-11 DIAGNOSIS — Z85828 Personal history of other malignant neoplasm of skin: Secondary | ICD-10-CM | POA: Diagnosis not present

## 2024-06-11 DIAGNOSIS — L570 Actinic keratosis: Secondary | ICD-10-CM | POA: Diagnosis not present

## 2024-06-11 DIAGNOSIS — L820 Inflamed seborrheic keratosis: Secondary | ICD-10-CM | POA: Diagnosis not present

## 2024-06-19 ENCOUNTER — Telehealth: Payer: Self-pay

## 2024-06-19 ENCOUNTER — Encounter: Payer: Self-pay | Admitting: Family Medicine

## 2024-06-19 ENCOUNTER — Other Ambulatory Visit (HOSPITAL_BASED_OUTPATIENT_CLINIC_OR_DEPARTMENT_OTHER): Payer: Self-pay

## 2024-06-19 ENCOUNTER — Encounter: Payer: Self-pay | Admitting: Emergency Medicine

## 2024-06-19 ENCOUNTER — Ambulatory Visit
Admission: EM | Admit: 2024-06-19 | Discharge: 2024-06-19 | Disposition: A | Discharge status: Home / Self Care | Attending: Emergency Medicine | Admitting: Emergency Medicine

## 2024-06-19 ENCOUNTER — Other Ambulatory Visit: Payer: Self-pay

## 2024-06-19 ENCOUNTER — Other Ambulatory Visit: Payer: Self-pay | Admitting: Family Medicine

## 2024-06-19 DIAGNOSIS — T7840XA Allergy, unspecified, initial encounter: Secondary | ICD-10-CM

## 2024-06-19 DIAGNOSIS — T63441A Toxic effect of venom of bees, accidental (unintentional), initial encounter: Secondary | ICD-10-CM | POA: Diagnosis not present

## 2024-06-19 MED ORDER — PREDNISONE 10 MG (21) PO TBPK: ORAL_TABLET | Freq: Every day | ORAL | 0 refills | Status: DC

## 2024-06-19 MED ORDER — DEXAMETHASONE SODIUM PHOSPHATE 10 MG/ML IJ SOLN
10.0000 mg | Freq: Once | INTRAMUSCULAR | Status: AC
  Administered 2024-06-19: 10 mg via INTRAMUSCULAR

## 2024-06-19 MED ORDER — EPINEPHRINE 0.3 MG/0.3ML IJ SOAJ: 0.3000 mg | INTRAMUSCULAR | 0 refills | Status: AC | PRN

## 2024-06-19 NOTE — ED Triage Notes (Addendum)
 Pt presents after being stung by multiple yellow jackets. He used and old epi pen [phone]) and does not have any more epi pens.  Denies sob or chest tightness.

## 2024-06-19 NOTE — ED Provider Notes (Signed)
 [facility]    CSN: [id number] Arrival date & time: 06/19/24  1836      History   Chief Complaint Chief Complaint  Patient presents with   Insect Bite    HPI Dan Sanchez is a 68 y.o. male.   Patient presents to clinic after being stung 10 to 12 bees.  He administered his EpiPen  in his left thigh which expired in 2015.  He administered the EpiPen  due to the amount of bites.  Previously in 2015 he had an allergic reaction where he developed shortness of breath and reaction to bee stings, which is why he had the EpiPen 's.  Put calamine lotion on the bite sites.  Ankles have begun to swell and the bites are quite itchy.  He did take 50 mg of Benadryl prior to arrival.  Denies any oral swelling, trouble swallowing, trouble breathing, wheezing or shortness of breath.  The history is provided by the patient and medical records.    Past Medical History:  Diagnosis Date   Allergy    CAD (coronary artery disease)    mild non-obstructive   GERD (gastroesophageal reflux disease)    Hyperlipidemia    S/P right rotator cuff repair    Sinusitis, bacterial    Urinary incontinence     Patient Active Problem List   Diagnosis Date Noted   Epidural abscess 11/14/2023   MSSA bacteremia 08/31/2023   Septic arthritis of lumbar spine (HCC) 08/29/2023   Normocytic anemia 08/29/2023   Elevated LFTs 08/29/2023   Osteoarthritis of knee 03/01/2023   Impingement syndrome of right shoulder region 02/27/2023   Coronary artery disease involving native coronary artery of native heart without angina pectoris 12/15/2022   Spinal stenosis of lumbar region with neurogenic claudication 03/23/2021   Lumbar radiculopathy 02/04/2021   HTN (hypertension) 03/29/2019   Overweight (BMI 25.0-29.9) 03/29/2019   Nasal septal deviation 02/01/2019   Nasal turbinate hypertrophy 02/01/2019   S/P right rotator cuff repair 10/26/2017   Contact dermatitis 08/27/2015   HSV-1 (herpes simplex virus 1)  infection 08/27/2015   Bee sting allergy 09/10/2014   Chest pain 08/11/2014   Allergic rhinitis 08/10/2014   Allergic conjunctivitis 02/20/2014   Insomnia 02/03/2014   GERD (gastroesophageal reflux disease) 02/03/2014   General medical examination 03/16/2012   Hyperlipidemia 12/24/2007   ERECTILE DYSFUNCTION 12/24/2007    Past Surgical History:  Procedure Laterality Date   COLONOSCOPY  04/08/2009   LEFT HEART CATHETERIZATION WITH CORONARY ANGIOGRAM N/A 08/13/2014   Procedure: LEFT HEART CATHETERIZATION WITH CORONARY ANGIOGRAM;  Surgeon: Lonni JONETTA Cash, MD;  Location: [facility] CATH LAB;  Service: Cardiovascular;  Laterality: N/A;   LUMBAR LAMINECTOMY FOR EPIDURAL ABSCESS N/A 08/30/2023   Procedure: LUMBAR LAMINECTOMY LUMBAR THREE-LUMBAR FOUR, LUMBAR FOUR-LUMBAR FIVE WITH EVACUATION OF EPIDURAL ABSCESS;  Surgeon: Cheryle Debby LABOR, MD;  Location: [facility] OR;  Service: Neurosurgery;  Laterality: N/A;   SHOULDER ARTHROSCOPY WITH ROTATOR CUFF REPAIR Right 10/26/2017   Procedure: RIGHT SHOULDER ARTHROSCOPY WITH ROTATOR CUFF REPAIR AND DEBRIDEMENT;  Surgeon: Gerome Charleston, MD;  Location: [facility];  Service: Orthopedics;  Laterality: Right;   SHOULDER SURGERY     Right shoulder    TEE WITHOUT CARDIOVERSION N/A 09/04/2023   Procedure: TRANSESOPHAGEAL ECHOCARDIOGRAM;  Surgeon: Okey Vina GAILS, MD;  Location: [facility] INVASIVE CV LAB;  Service: Cardiovascular;  Laterality: N/A;       Home Medications    Prior to Admission medications   Medication Sig Start Date End Date Taking? Authorizing Provider  EPINEPHrine  0.3 mg/0.3 mL IJ SOAJ injection Inject 0.3 mg into the muscle as needed for anaphylaxis. 06/19/24  Yes Rickie Gutierres  N, FNP  predniSONE  (STERAPRED UNI-PAK 21 TAB) 10 MG (21) TBPK tablet Take by mouth daily. Take as prescribed 06/19/24  Yes Candido Flott  N, FNP  acetaminophen  (TYLENOL ) 325 MG tablet Take 2 tablets (650 mg total) by mouth every 8 (eight) hours as needed  for mild pain. 09/04/23   Gonfa, Taye T, MD  amLODipine  (NORVASC ) 10 MG tablet Take 1 tablet (10 mg total) by mouth daily. 01/22/24   Verlin Lonni BIRCH, MD  ascorbic acid  (VITAMIN C ) 1000 MG tablet Take by mouth. 02/01/19   [provider]  aspirin  EC 81 MG tablet Take 1 tablet (81 mg total) by mouth daily. Swallow whole. 10/26/20   Verlin Lonni BIRCH, MD  celecoxib  (CELEBREX ) 200 MG capsule Take 1 capsule (200 mg total) by mouth 2 (two) times daily. 08/16/23     celecoxib  (CELEBREX ) 200 MG capsule Take 1 capsule (200 mg total) by mouth daily with food. 05/28/24     fexofenadine (ALLEGRA) 180 MG tablet Take 180 mg by mouth daily.    [provider]  fluticasone  (FLONASE ) 50 MCG/ACT nasal spray Place 2 sprays into both nostrils daily. 06/28/18   Gladis Elsie BROCKS, PA-C  neomycin -polymyxin-hydrocortisone (CORTISPORIN ) 3.5-10000-1 OTIC suspension Place 3 drops into the right ear 3 (three) times daily. Use up to 10 days 04/09/24   Tabori, Katherine E, MD  Omega-3 Fatty Acids (FISH OIL) 1200 MG CAPS Take 2,400 mg by mouth 2 (two) times daily.    [provider]  omeprazole  (PRILOSEC) 20 MG capsule Take 1 capsule (20 mg total) by mouth daily. 04/16/24 04/16/25  Tabori, Katherine E, MD  rosuvastatin  (CRESTOR ) 40 MG tablet Take 1 tablet (40 mg total) by mouth daily. 09/18/23   Gonfa, Taye T, MD  rosuvastatin  (CRESTOR ) 40 MG tablet Take 1 tablet (40 mg total) by mouth daily. 01/22/24   Verlin Lonni BIRCH, MD  valACYclovir  (VALTREX ) 1000 MG tablet Take 1 tablet (1,000 mg total) by mouth 2 (two) times daily. At the sight of a cold sore 06/03/24   Mahlon Comer BRAVO, MD    Family History Family History  Problem Relation Age of Onset   Heart disease Father        MI age 67   Colon cancer Neg Hx    Esophageal cancer Neg Hx    Rectal cancer Neg Hx    Stomach cancer Neg Hx    Colon polyps Neg Hx    Sleep apnea Neg Hx     Social History Social History   Tobacco Use    Smoking status: Never   Smokeless tobacco: Never  Vaping Use   Vaping status: Never Used  Substance Use Topics   Alcohol use: Yes    Alcohol/week: 4.0 standard drinks of alcohol    Types: 4 Standard drinks or equivalent per week    Comment: occ   Drug use: No     Allergies   Bee venom   Review of Systems Review of Systems  Per HPI  Physical Exam Triage Vital Signs ED Triage Vitals  Encounter Vitals Group     BP 06/19/24 1846 (!) 159/81     Girls Systolic BP Percentile --      Girls Diastolic BP Percentile --      Boys Systolic BP Percentile --      Boys Diastolic BP Percentile --  Pulse Rate 06/19/24 1846 72     Resp 06/19/24 1846 19     Temp 06/19/24 1846 98 F (36.7 C)     Temp src --      SpO2 06/19/24 1846 97 %     Weight --      Height --      Head Circumference --      Peak Flow --      Pain Score 06/19/24 1845 4     Pain Loc --      Pain Education --      Exclude from Growth Chart --    No data found.  Updated Vital Signs BP (!) 159/81 (BP Location: Right Arm)   Pulse 72   Temp 98 F (36.7 C)   Resp 19   SpO2 97%   Visual Acuity Right Eye Distance:   Left Eye Distance:   Bilateral Distance:    Right Eye Near:   Left Eye Near:    Bilateral Near:     Physical Exam Vitals and nursing note reviewed.  Constitutional:      Appearance: Normal appearance.  HENT:     Head: Normocephalic and atraumatic.     Right Ear: External ear normal.     Left Ear: External ear normal.     Nose: Nose normal.     Mouth/Throat:     Mouth: Mucous membranes are moist.  Cardiovascular:     Rate and Rhythm: Normal rate.  Pulmonary:     Effort: Pulmonary effort is normal. No respiratory distress.  Skin:    Findings: Rash present.     Comments: Erythematous rash with swelling on the hands, forearms and lower extremities from bug bites/stings.  Neurological:     General: No focal deficit present.     Mental Status: He is alert and oriented to person,  place, and time.  Psychiatric:        Mood and Affect: Mood normal.        Behavior: Behavior normal. Behavior is cooperative.      UC Treatments / Results  Labs (all labs ordered are listed, but only abnormal results are displayed) Labs Reviewed - No data to display  EKG   Radiology No results found.  Procedures Procedures (including critical care time)  Medications Ordered in UC Medications  dexamethasone  (DECADRON ) injection 10 mg (10 mg Intramuscular Given 06/19/24 1902)    Initial Impression / Assessment and Plan / UC Course  I have reviewed the triage vital signs and the nursing notes.  Pertinent labs & imaging results that were available during my care of the patient were reviewed by me and considered in my medical decision making (see chart for details).  Vitals and triage reviewed, patient is hemodynamically stable.  Able to speak freely, no acute respiratory distress, oxygenation 97% on room air.  Patient did administer his EpiPen  around 3 hours prior.  Low concern for anaphylaxis at this time.  Will treat for allergic reaction with IM steroid and oral steroid taper.  Refilled EpiPen 's.  Plan of care, follow-up care return precautions given, no questions at this time.     Final Clinical Impressions(s) / UC Diagnoses   Final diagnoses:  Allergic reaction, initial encounter  Bee sting, accidental or unintentional, initial encounter     Discharge Instructions      The steroid shot will help with the itching and inflammation.  Tomorrow with breakfast start the steroid taper.  You can use the EpiPen  as  needed for any wheezing or shortness of breath, please call 911 if you develop any trouble swallowing, trouble breathing, wheezing or shortness of breath.  Benadryl can be taken every 6 hours, 25 to 50 mg.  You can continue to use the calamine lotion.  Symptoms should improve with the steroids, if no improvement or any changes return to clinic or follow-up with  your primary care provider for reevaluation.     ED Prescriptions     Medication Sig Dispense Auth. Provider   predniSONE  (STERAPRED UNI-PAK 21 TAB) 10 MG (21) TBPK tablet Take by mouth daily. Take as prescribed 21 tablet Dreama, Damareon Lanni  N, FNP   EPINEPHrine  0.3 mg/0.3 mL IJ SOAJ injection Inject 0.3 mg into the muscle as needed for anaphylaxis. 2 each Dreama Lorena SAILOR, FNP      PDMP not reviewed this encounter.   Dreama Cherryl SAILOR, FNP 06/19/24 1904

## 2024-06-19 NOTE — Discharge Instructions (Addendum)
 The steroid shot will help with the itching and inflammation.  Tomorrow with breakfast start the steroid taper.  You can use the EpiPen  as needed for any wheezing or shortness of breath, please call 911 if you develop any trouble swallowing, trouble breathing, wheezing or shortness of breath.  Benadryl can be taken every 6 hours, 25 to 50 mg.  You can continue to use the calamine lotion.  Symptoms should improve with the steroids, if no improvement or any changes return to clinic or follow-up with your primary care provider for reevaluation.

## 2024-06-19 NOTE — Telephone Encounter (Signed)
 Copied from CRM #[id number]. Topic: Clinical - Medication Question >> Jun 19, 2024  4:33 PM Sophia H wrote: Reason for CRM: Patient states he is needing a refill sent to pharmacy for an epi pen, do not see one listed in his chart. States he got stung by a yellow jacket and had to use his last pen, needing a new one asap. The pens he had were prescribed back on 09/10/2014 by Dr. Mahlon per patient... Please advise. Decl NT, states had another call coming in    [facility] - [facility]

## 2024-06-20 ENCOUNTER — Other Ambulatory Visit (HOSPITAL_BASED_OUTPATIENT_CLINIC_OR_DEPARTMENT_OTHER): Payer: Self-pay

## 2024-06-20 NOTE — Telephone Encounter (Signed)
 Please advise. Thank you

## 2024-06-20 NOTE — Telephone Encounter (Addendum)
 Went to urgent care yesterday and prescribe updated epic prescription np [facility] sent prescription to [facility] should have gone to [facility] . How do we get this resent to the correct pharmacy as pt leaves to go out of town tomorrow  please call pt to confirm receipt from pharmacy

## 2024-06-24 ENCOUNTER — Other Ambulatory Visit (HOSPITAL_BASED_OUTPATIENT_CLINIC_OR_DEPARTMENT_OTHER): Payer: Self-pay

## 2024-07-12 ENCOUNTER — Other Ambulatory Visit (HOSPITAL_BASED_OUTPATIENT_CLINIC_OR_DEPARTMENT_OTHER): Payer: Self-pay

## 2024-07-12 ENCOUNTER — Encounter: Payer: Self-pay | Admitting: Family Medicine

## 2024-07-12 ENCOUNTER — Ambulatory Visit (INDEPENDENT_AMBULATORY_CARE_PROVIDER_SITE_OTHER): Admitting: Family Medicine

## 2024-07-12 VITALS — BP 124/60 | HR 73 | Temp 98.2°F | Ht 70.0 in | Wt 196.6 lb

## 2024-07-12 DIAGNOSIS — E7800 Pure hypercholesterolemia, unspecified: Secondary | ICD-10-CM | POA: Diagnosis not present

## 2024-07-12 DIAGNOSIS — Z91030 Bee allergy status: Secondary | ICD-10-CM | POA: Diagnosis not present

## 2024-07-12 DIAGNOSIS — I10 Essential (primary) hypertension: Secondary | ICD-10-CM

## 2024-07-12 LAB — LIPID PANEL
Cholesterol: 155 mg/dL (ref 0–200)
HDL: 69 mg/dL (ref >39.00)
LDL Cholesterol: 52 mg/dL (ref 0–99)
NonHDL: 86.24
Total CHOL/HDL Ratio: 2
Triglycerides: 172 mg/dL — ABNORMAL HIGH (ref 0.0–149.0)
VLDL: 34.4 mg/dL (ref 0.0–40.0)

## 2024-07-12 LAB — HEPATIC FUNCTION PANEL
ALT: 36 U/L (ref 0–53)
AST: 24 U/L (ref 0–37)
Albumin: 4.5 g/dL (ref 3.5–5.2)
Alkaline Phosphatase: 58 U/L (ref 39–117)
Bilirubin, Direct: 0.1 mg/dL (ref 0.0–0.3)
Total Bilirubin: 0.5 mg/dL (ref 0.2–1.2)
Total Protein: 7.1 g/dL (ref 6.0–8.3)

## 2024-07-12 LAB — CBC WITH DIFFERENTIAL/PLATELET
Basophils Absolute: 0 K/uL (ref 0.0–0.1)
Basophils Relative: 1 % (ref 0.0–3.0)
Eosinophils Absolute: 0.2 K/uL (ref 0.0–0.7)
Eosinophils Relative: 3.7 % (ref 0.0–5.0)
HCT: 40.7 % (ref 39.0–52.0)
Hemoglobin: 13.8 g/dL (ref 13.0–17.0)
Lymphocytes Relative: 24.7 % (ref 12.0–46.0)
Lymphs Abs: 1 K/uL (ref 0.7–4.0)
MCHC: 34 g/dL (ref 30.0–36.0)
MCV: 85.2 fl (ref 78.0–100.0)
Monocytes Absolute: 0.4 K/uL (ref 0.1–1.0)
Monocytes Relative: 9.3 % (ref 3.0–12.0)
Neutro Abs: 2.6 K/uL (ref 1.4–7.7)
Neutrophils Relative %: 61.3 % (ref 43.0–77.0)
Platelets: 234 K/uL (ref 150.0–400.0)
RBC: 4.77 Mil/uL (ref 4.22–5.81)
RDW: 14.5 % (ref 11.5–15.5)
WBC: 4.2 K/uL (ref 4.0–10.5)

## 2024-07-12 LAB — BASIC METABOLIC PANEL WITH GFR
BUN: 18 mg/dL (ref 6–23)
CO2: 30 meq/L (ref 19–32)
Calcium: 9.8 mg/dL (ref 8.4–10.5)
Chloride: 101 meq/L (ref 96–112)
Creatinine, Ser: 0.96 mg/dL (ref 0.40–1.50)
GFR: 82.68 mL/min (ref >60.00)
Glucose, Bld: 88 mg/dL (ref 70–99)
Potassium: 4.1 meq/L (ref 3.5–5.1)
Sodium: 139 meq/L (ref 135–145)

## 2024-07-12 LAB — TSH: TSH: 1.44 u[IU]/mL (ref 0.35–5.50)

## 2024-07-12 MED ORDER — PREDNISONE 10 MG PO TABS
ORAL_TABLET | ORAL | 1 refills | Status: AC
  Filled 2024-07-12: qty 18, 9d supply, fill #0
  Filled 2024-08-06: qty 18, 9d supply, fill #1

## 2024-07-12 NOTE — Patient Instructions (Signed)
 Schedule your complete physical after 12/18 We'll notify you of your lab results and make any changes if needed Fill the Prednisone  and then the refill to have on hand Check w/ [facility] about the EpiPen  cost (or look on [email or web address]) Call with any questions or concerns Enjoy the rest of your summer!!

## 2024-07-12 NOTE — Progress Notes (Signed)
   Subjective:    Patient ID: Dan Sanchez, male    DOB: December 18, 1955, 68 y.o.   MRN: [id number]  HPI Bee Allergy- pt reports 2 nests in the ground.  Had ~25 stings last month and developed subsequent swelling.  [facility] told him that Medicare does not cover Epipen .  Would like to have prescription for Prednisone  on hand  Hyperlipidemia- chronic problem.  Pt is overdue for lipids.  Currently on Crestor .  Denies abd pain, N/V.  HTN- chronic problem.  On Amlodipine  10mg  daily w/ good control.  Denies CP, SOB, HA's, visual changes, edema.   Review of Systems For ROS see HPI     Objective:   Physical Exam Vitals reviewed.  Constitutional:      General: He is not in acute distress.    Appearance: Normal appearance. He is not ill-appearing.  HENT:     Head: Normocephalic and atraumatic.  Eyes:     Extraocular Movements: Extraocular movements intact.     Conjunctiva/sclera: Conjunctivae normal.  Cardiovascular:     Rate and Rhythm: Normal rate and regular rhythm.     Pulses: Normal pulses.  Pulmonary:     Effort: Pulmonary effort is normal. No respiratory distress.  Musculoskeletal:     Cervical back: Normal range of motion and neck supple.     Right lower leg: No edema.     Left lower leg: No edema.  Skin:    General: Skin is warm and dry.  Neurological:     General: No focal deficit present.     Mental Status: He is alert and oriented to person, place, and time.  Psychiatric:        Mood and Affect: Mood normal.        Behavior: Behavior normal.        Thought Content: Thought content normal.           Assessment & Plan:

## 2024-07-13 NOTE — Assessment & Plan Note (Signed)
Chronic problem.  On Crestor w/o difficulty.  Check labs.  Adjust meds prn  

## 2024-07-13 NOTE — Assessment & Plan Note (Signed)
 Chronic problem.  On Amlodipine  w/ good control.  Currently asymptomatic.

## 2024-07-13 NOTE — Assessment & Plan Note (Signed)
 Pt recently stung multiple times.  Needs new epi-pen prescription but was told this wasn't covered by Medicare.  Will provide paper script so he can shop it around.  Also provided refill on Prednisone  to have on hand in case of rxn.  Pt expressed understanding and is in agreement w/ plan.

## 2024-07-15 ENCOUNTER — Ambulatory Visit: Payer: Self-pay | Admitting: Family Medicine

## 2024-07-15 ENCOUNTER — Other Ambulatory Visit (HOSPITAL_BASED_OUTPATIENT_CLINIC_OR_DEPARTMENT_OTHER): Payer: Self-pay

## 2024-07-22 DIAGNOSIS — K080 Exfoliation of teeth due to systemic causes: Secondary | ICD-10-CM | POA: Diagnosis not present

## 2024-08-06 ENCOUNTER — Other Ambulatory Visit (HOSPITAL_BASED_OUTPATIENT_CLINIC_OR_DEPARTMENT_OTHER): Payer: Self-pay

## 2024-08-06 DIAGNOSIS — M1711 Unilateral primary osteoarthritis, right knee: Secondary | ICD-10-CM | POA: Diagnosis not present

## 2024-09-26 DIAGNOSIS — L820 Inflamed seborrheic keratosis: Secondary | ICD-10-CM | POA: Diagnosis not present

## 2024-09-26 DIAGNOSIS — L570 Actinic keratosis: Secondary | ICD-10-CM | POA: Diagnosis not present

## 2024-09-26 DIAGNOSIS — L821 Other seborrheic keratosis: Secondary | ICD-10-CM | POA: Diagnosis not present

## 2024-09-26 DIAGNOSIS — Z85828 Personal history of other malignant neoplasm of skin: Secondary | ICD-10-CM | POA: Diagnosis not present

## 2024-09-26 DIAGNOSIS — D0462 Carcinoma in situ of skin of left upper limb, including shoulder: Secondary | ICD-10-CM | POA: Diagnosis not present

## 2024-10-15 ENCOUNTER — Other Ambulatory Visit (HOSPITAL_BASED_OUTPATIENT_CLINIC_OR_DEPARTMENT_OTHER): Payer: Self-pay

## 2024-10-15 ENCOUNTER — Other Ambulatory Visit: Payer: Self-pay

## 2024-10-15 ENCOUNTER — Other Ambulatory Visit: Payer: Self-pay | Admitting: Family Medicine

## 2024-10-15 MED ORDER — OMEPRAZOLE 20 MG PO CPDR
20.0000 mg | DELAYED_RELEASE_CAPSULE | Freq: Every day | ORAL | 1 refills | Status: AC
  Filled 2024-10-15: qty 90, 90d supply, fill #0

## 2024-10-17 ENCOUNTER — Encounter: Payer: Self-pay | Admitting: Family Medicine

## 2024-10-17 ENCOUNTER — Ambulatory Visit (INDEPENDENT_AMBULATORY_CARE_PROVIDER_SITE_OTHER): Admitting: Family Medicine

## 2024-10-17 ENCOUNTER — Other Ambulatory Visit (HOSPITAL_BASED_OUTPATIENT_CLINIC_OR_DEPARTMENT_OTHER): Payer: Self-pay

## 2024-10-17 VITALS — BP 134/74 | HR 78 | Temp 98.0°F | Ht 70.0 in | Wt 201.2 lb

## 2024-10-17 DIAGNOSIS — J0100 Acute maxillary sinusitis, unspecified: Secondary | ICD-10-CM | POA: Diagnosis not present

## 2024-10-17 MED ORDER — PREDNISONE 10 MG PO TABS
ORAL_TABLET | ORAL | 0 refills | Status: DC
  Filled 2024-10-17: qty 18, 9d supply, fill #0

## 2024-10-17 NOTE — Patient Instructions (Signed)
 Follow up as needed or as scheduled START the Prednisone  as directed- 3 pills at the same time x3 days, then 2 pills at the same time x3 days, then 1 pill daily.  Take w/ food  Drink LOTS of fluids Call with any questions or concerns Stay Safe!  Stay Healthy! HAPPY HOLIDAYS!!!

## 2024-10-17 NOTE — Progress Notes (Signed)
   Subjective:    Patient ID: Dan Sanchez, male    DOB: August 22, 1956, 68 y.o.   MRN: [id number]  HPI Sinus pressure- 'i think I have a sinus infxn'.  Sxs started ~2 weeks ago.  No fever.  + HA, sinus pressure.  No tooth pain.  Bilateral ear fullness.  No cough.  No known sick contacts.  Pt has hx of previous sinus infxns.  Pt reports this feels similar to when he required Prednisone  to clear congestion and inflammation.  Doesn't feel sick, per se.  Was recently blowing leaves and doing a lot of yard work   Review of Systems For ROS see HPI     Objective:   Physical Exam Vitals reviewed.  Constitutional:      General: He is not in acute distress.    Appearance: Normal appearance. He is well-developed.  HENT:     Head: Normocephalic and atraumatic.     Right Ear: Tympanic membrane normal.     Left Ear: Tympanic membrane normal.     Nose: Mucosal edema and congestion present. No rhinorrhea.     Right Sinus: Maxillary sinus tenderness and frontal sinus tenderness present.     Left Sinus: Maxillary sinus tenderness and frontal sinus tenderness present.     Mouth/Throat:     Pharynx: No oropharyngeal exudate or posterior oropharyngeal erythema.  Eyes:     Conjunctiva/sclera: Conjunctivae normal.     Pupils: Pupils are equal, round, and reactive to light.  Cardiovascular:     Rate and Rhythm: Normal rate and regular rhythm.     Heart sounds: Normal heart sounds.  Pulmonary:     Effort: Pulmonary effort is normal. No respiratory distress.     Breath sounds: Normal breath sounds. No wheezing.  Musculoskeletal:     Cervical back: Normal range of motion and neck supple.  Lymphadenopathy:     Cervical: No cervical adenopathy.  Skin:    General: Skin is warm and dry.  Neurological:     General: No focal deficit present.     Mental Status: He is alert and oriented to person, place, and time.  Psychiatric:        Mood and Affect: Mood normal.        Behavior: Behavior normal.         Thought Content: Thought content normal.           Assessment & Plan:  Sinusitis- new.  No evidence of bacterial infxn as pt is well appearing.  Suspect inflammation rather than infxn.  Start prednisone  taper.  Reviewed supportive care and red flags that should prompt return.  Pt expressed understanding and is in agreement w/ plan.

## 2024-11-06 ENCOUNTER — Ambulatory Visit (INDEPENDENT_AMBULATORY_CARE_PROVIDER_SITE_OTHER)

## 2024-11-06 VITALS — Ht 70.0 in | Wt 201.0 lb

## 2024-11-06 DIAGNOSIS — Z0000 Encounter for general adult medical examination without abnormal findings: Secondary | ICD-10-CM

## 2024-11-06 NOTE — Progress Notes (Deleted)
 Chief Complaint  Patient presents with   Medicare Wellness     Subjective:   Dan Sanchez is a 68 y.o. male who presents for a Medicare Annual Wellness Visit.  Visit info / Clinical Intake: Medicare Wellness Visit Type:: Subsequent Annual Wellness Visit Persons participating in visit and providing information:: patient Medicare Wellness Visit Mode:: Telephone If telephone:: video declined Since this visit was completed virtually, some vitals may be partially provided or unavailable. Missing vitals are due to the limitations of the virtual format.: Unable to obtain vitals - no equipment If Telephone or Video please confirm:: I connected with patient using audio/video enable telemedicine. I verified patient identity with two identifiers, discussed telehealth limitations, and patient agreed to proceed. Patient Location:: Home Provider Location:: Home Interpreter Needed?: No Pre-visit prep was completed: yes AWV questionnaire completed by patient prior to visit?: no Living arrangements:: lives with spouse/significant other Patient's Overall Health Status Rating: very good Typical amount of pain: none Does pain affect daily life?: no Are you currently prescribed opioids?: no  Dietary Habits and Nutritional Risks How many meals a day?: 3 Eats fruit and vegetables daily?: yes Most meals are obtained by: preparing own meals In the last 2 weeks, have you had any of the following?: none Diabetic:: no  Functional Status Activities of Daily Living (to include ambulation/medication): Independent Ambulation: Independent Medication Administration: Independent Home Management (perform basic housework or laundry): Independent Manage your own finances?: yes Primary transportation is: driving Concerns about vision?: no *vision screening is required for WTM* Concerns about hearing?: no  Fall Screening Falls in the past year?: 0 Number of falls in past year: 0 Was there an injury  with Fall?: 0 Fall Risk Category Calculator: 0 Patient Fall Risk Level: Low Fall Risk  Fall Risk Patient at Risk for Falls Due to: No Fall Risks Fall risk Follow up: Falls evaluation completed; Falls prevention discussed  Home and Transportation Safety: All rugs have non-skid backing?: yes All stairs or steps have railings?: yes Grab bars in the bathtub or shower?: (!) no Have non-skid surface in bathtub or shower?: yes Good home lighting?: yes Regular seat belt use?: yes Hospital stays in the last year:: no  Cognitive Assessment Difficulty concentrating, remembering, or making decisions? : no Will 6CIT or Mini Cog be Completed: no 6CIT or Mini Cog Declined: patient alert, oriented, able to answer questions appropriately and recall recent events Was the patient able to repeat memory words in 3 tries?: yes Which version was used?: Version1 : banana, sunrise, chair Clock numbers correct?: yes Clock time correct (11:10)?: yes Normal clock drawing test?: 2 How many words correct?: 3 Which version was used?: Version 1: banana, sunrise, chair Mini-Cog Scoring: 5  Advance Directives (For Healthcare) Does Patient Have a Medical Advance Directive?: Yes Type of Advance Directive: Healthcare Power of [place]; Living will Copy of Healthcare Power of Attorney in Chart?: No - copy requested Copy of Living Will in Chart?: No - copy requested  Reviewed/Updated  Reviewed/Updated: Reviewed All (Medical, Surgical, Family, Medications, Allergies, Care Teams, Patient Goals)    Allergies (verified) Bee venom   Current Medications (verified) Outpatient Encounter Medications as of 11/06/2024  Medication Sig   acetaminophen  (TYLENOL ) 325 MG tablet Take 2 tablets (650 mg total) by mouth every 8 (eight) hours as needed for mild pain.   amLODipine  (NORVASC ) 10 MG tablet Take 1 tablet (10 mg total) by mouth daily.   ascorbic acid  (VITAMIN C ) 1000 MG tablet Take by mouth.  aspirin  EC 81 MG  tablet Take 1 tablet (81 mg total) by mouth daily. Swallow whole.   celecoxib  (CELEBREX ) 200 MG capsule Take 1 capsule (200 mg total) by mouth 2 (two) times daily.   celecoxib  (CELEBREX ) 200 MG capsule Take 1 capsule (200 mg total) by mouth daily with food.   EPINEPHrine  0.3 mg/0.3 mL IJ SOAJ injection Inject 0.3 mg into the muscle as needed for anaphylaxis.   fexofenadine (ALLEGRA) 180 MG tablet Take 180 mg by mouth daily.   fluticasone  (FLONASE ) 50 MCG/ACT nasal spray Place 2 sprays into both nostrils daily.   neomycin -polymyxin-hydrocortisone (CORTISPORIN ) 3.5-10000-1 OTIC suspension Place 3 drops into the right ear 3 (three) times daily. Use up to 10 days   Omega-3 Fatty Acids (FISH OIL) 1200 MG CAPS Take 2,400 mg by mouth 2 (two) times daily.   omeprazole  (PRILOSEC) 20 MG capsule Take 1 capsule (20 mg total) by mouth daily.   predniSONE  (DELTASONE ) 10 MG tablet Take 3 tablets by mouth daily x 3 days then 2 tablets by mouth x 3 days then 1 tablet x 3 days.  Take w/ food.   rosuvastatin  (CRESTOR ) 40 MG tablet Take 1 tablet (40 mg total) by mouth daily.   valACYclovir  (VALTREX ) 1000 MG tablet Take 1 tablet (1,000 mg total) by mouth 2 (two) times daily. At the sight of a cold sore   No facility-administered encounter medications on file as of 11/06/2024.    History: Past Medical History:  Diagnosis Date   Allergy    CAD (coronary artery disease)    mild non-obstructive   GERD (gastroesophageal reflux disease)    Hyperlipidemia    S/P right rotator cuff repair    Sinusitis, bacterial    Urinary incontinence    Past Surgical History:  Procedure Laterality Date   COLONOSCOPY  04/08/2009   LEFT HEART CATHETERIZATION WITH CORONARY ANGIOGRAM N/A 08/13/2014   Procedure: LEFT HEART CATHETERIZATION WITH CORONARY ANGIOGRAM;  Surgeon: Lonni JONETTA Cash, MD;  Location[facility] CATH LAB;  Service: Cardiovascular;  Laterality: N/A;   LUMBAR LAMINECTOMY FOR EPIDURAL ABSCESS N/A 08/30/2023    Procedure: LUMBAR LAMINECTOMY LUMBAR THREE-LUMBAR FOUR, LUMBAR FOUR-LUMBAR FIVE WITH EVACUATION OF EPIDURAL ABSCESS;  Surgeon: Cheryle Debby LABOR, MD;  Location: [facility] OR;  Service: Neurosurgery;  Laterality: N/A;   SHOULDER ARTHROSCOPY WITH ROTATOR CUFF REPAIR Right 10/26/2017   Procedure: RIGHT SHOULDER ARTHROSCOPY WITH ROTATOR CUFF REPAIR AND DEBRIDEMENT;  Surgeon: Gerome Charleston, MD;  Location: [facility];  Service: Orthopedics;  Laterality: Right;   SHOULDER SURGERY     Right shoulder    TEE WITHOUT CARDIOVERSION N/A 09/04/2023   Procedure: TRANSESOPHAGEAL ECHOCARDIOGRAM;  Surgeon: Okey Vina GAILS, MD;  Location: [facility] INVASIVE CV LAB;  Service: Cardiovascular;  Laterality: N/A;   Family History  Problem Relation Age of Onset   Heart disease Father        MI age 64   Colon cancer Neg Hx    Esophageal cancer Neg Hx    Rectal cancer Neg Hx    Stomach cancer Neg Hx    Colon polyps Neg Hx    Sleep apnea Neg Hx    Social History   Occupational History   Occupation: Museum/gallery Curator: [name]  Tobacco Use   Smoking status: Never   Smokeless tobacco: Never  Vaping Use   Vaping status: Never Used  Substance and Sexual Activity   Alcohol use: Yes    Alcohol/week: 4.0 standard drinks of alcohol  Types: 4 Standard drinks or equivalent per week    Comment: occ   Drug use: No   Sexual activity: Not on file   Tobacco Counseling Counseling given: Not Answered  SDOH Screenings   Food Insecurity: No Food Insecurity (11/06/2024)  Housing: Unknown (11/06/2024)  Transportation Needs: No Transportation Needs (11/06/2024)  Utilities: Not At Risk (11/06/2024)  Depression (PHQ2-9): Low Risk  (11/06/2024)  Physical Activity: Inactive (11/06/2024)  Social Connections: Moderately Isolated (11/06/2024)  Stress: No Stress Concern Present (11/06/2024)  Tobacco Use: Low Risk  (11/06/2024)  Health Literacy: Adequate Health Literacy (11/06/2024)   See  flowsheets for full screening details  Depression Screen PHQ 2 & 9 Depression Scale- Over the past 2 weeks, how often have you been bothered by any of the following problems? Little interest or pleasure in doing things: 0 Feeling down, depressed, or hopeless (PHQ Adolescent also includes...irritable): 0 PHQ-2 Total Score: 0 Trouble falling or staying asleep, or sleeping too much: 0 Feeling tired or having little energy: 0 Poor appetite or overeating (PHQ Adolescent also includes...weight loss): 0 Feeling bad about yourself - or that you are a failure or have let yourself or your family down: 0 Trouble concentrating on things, such as reading the newspaper or watching television (PHQ Adolescent also includes...like school work): 0 Moving or speaking so slowly that other people could have noticed. Or the opposite - being so fidgety or restless that you have been moving around a lot more than usual: 0 Thoughts that you would be better off dead, or of hurting yourself in some way: 0 PHQ-9 Total Score: 0 If you checked off any problems, how difficult have these problems made it for you to do your work, take care of things at home, or get along with other people?: Not difficult at all  Depression Treatment Depression Interventions/Treatment : EYV7-0 Score <4 Follow-up Not Indicated     Goals Addressed             This Visit's Progress    Patient Stated       To stay healthy/2025             Objective:    Today's Vitals   11/06/24 0852  Weight: 201 lb (91.2 kg)  Height: 5' 10 (1.778 m)   Body mass index is 28.84 kg/m.  Hearing/Vision screen Hearing Screening - Comments:: Denies hearing difficulties   Vision Screening - Comments:: Denies vision issues. /not UTD/ no provider Immunizations and Health Maintenance Health Maintenance  Topic Date Due   Zoster Vaccines- Shingrix (1 of 2) Never done   Influenza Vaccine  06/28/2024   COVID-19 Vaccine (4 - 2025-26 season)  07/29/2024   Pneumococcal Vaccine: 50+ Years (1 of 2 - PCV) 07/12/2025 (Originally 07/23/1975)   Medicare Annual Wellness (AWV)  11/06/2025   Colonoscopy  05/26/2029   DTaP/Tdap/Td (4 - Td or Tdap) 07/25/2032   Hepatitis C Screening  Completed   Meningococcal B Vaccine  Aged Out        Assessment/Plan:  This is a routine wellness examination for Bj's.  Patient Care Team: Mahlon Comer BRAVO, MD as PCP - General (Family Medicine) Verlin Lonni BIRCH, MD as PCP - Cardiology (Cardiology) Lelon Glendia ONEIDA DEVONNA as Physician Assistant (Physician Assistant) Verlin Lonni BIRCH, MD as Consulting Physician (Cardiology)  I have personally reviewed and noted the following in the patients chart:   Medical and social history Use of alcohol, tobacco or illicit drugs  Current medications and supplements including opioid prescriptions.  Functional ability and status Nutritional status Physical activity Advanced directives List of other physicians Hospitalizations, surgeries, and ER visits in previous 12 months Vitals Screenings to include cognitive, depression, and falls Referrals and appointments  No orders of the defined types were placed in this encounter.  In addition, I have reviewed and discussed with patient certain preventive protocols, quality metrics, and best practice recommendations. A written personalized care plan for preventive services as well as general preventive health recommendations were provided to patient.   Tanyla Stege L Aleea Hendry, CMA   11/06/2024   Return in 1 year (on 11/06/2025).  After Visit Summary: (MyChart) Due to this being a telephonic visit, the after visit summary with patients personalized plan was offered to patient via MyChart   Nurse Notes: Patient is due for a flu vaccine and would like to get it during his up coming visit.  He declines all other due vaccines.  Patient had no other concerns to address today.

## 2024-11-06 NOTE — Patient Instructions (Signed)
 Mr. Vest,  Thank you for taking the time for your Medicare Wellness Visit. I appreciate your continued commitment to your health goals. Please review the care plan we discussed, and feel free to reach out if I can assist you further.  Please note that Annual Wellness Visits do not include a physical exam. Some assessments may be limited, especially if the visit was conducted virtually. If needed, we may recommend an in-person follow-up with your provider.  Ongoing Care Seeing your primary care provider every 3 to 6 months helps us  monitor your health and provide consistent, personalized care. Your next office visit on 11/22/2024.  You are due for a flu vaccine and it can be given during your up coming office visit.  Each day, aim for 6 glasses of water, plenty of protein in your diet and try to get up and walk/ stretch every hour for 5-10 minutes at a time.  Merry Christmas.  Referrals If a referral was made during today's visit and you haven't received any updates within two weeks, please contact the referred provider directly to check on the status.  Recommended Screenings:  Health Maintenance  Topic Date Due   Zoster (Shingles) Vaccine (1 of 2) Never done   Flu Shot  06/28/2024   COVID-19 Vaccine (4 - 2025-26 season) 07/29/2024   Medicare Annual Wellness Visit  11/14/2024   Pneumococcal Vaccine for age over 61 (1 of 2 - PCV) 07/12/2025*   Colon Cancer Screening  05/26/2029   DTaP/Tdap/Td vaccine (4 - Td or Tdap) 07/25/2032   Hepatitis C Screening  Completed   Meningitis B Vaccine  Aged Out  *Topic was postponed. The date shown is not the original due date.       11/06/2024    8:55 AM  Advanced Directives  Does Patient Have a Medical Advance Directive? Yes  Type of Estate Agent of [place];Living will  Copy of Healthcare Power of Attorney in Chart? No - copy requested    Vision: Annual vision screenings are recommended for early detection of glaucoma,  cataracts, and diabetic retinopathy. These exams can also reveal signs of chronic conditions such as diabetes and high blood pressure.  Dental: Annual dental screenings help detect early signs of oral cancer, gum disease, and other conditions linked to overall health, including heart disease and diabetes.  Please see the attached documents for additional preventive care recommendations.

## 2024-11-07 NOTE — Progress Notes (Signed)
 Chief Complaint  Patient presents with   Medicare Wellness     Subjective:   Dan Sanchez is a 68 y.o. male who presents for a Medicare Annual Wellness Visit.  Visit info / Clinical Intake: Medicare Wellness Visit Type:: Initial Annual Wellness Visit Persons participating in visit and providing information:: patient Medicare Wellness Visit Mode:: Telephone If telephone:: video declined Since this visit was completed virtually, some vitals may be partially provided or unavailable. Missing vitals are due to the limitations of the virtual format.: Unable to obtain vitals - no equipment If Telephone or Video please confirm:: I connected with patient using audio/video enable telemedicine. I verified patient identity with two identifiers, discussed telehealth limitations, and patient agreed to proceed. Patient Location:: Home Provider Location:: Home Interpreter Needed?: No Pre-visit prep was completed: yes AWV questionnaire completed by patient prior to visit?: no Living arrangements:: lives with spouse/significant other Patient's Overall Health Status Rating: very good Typical amount of pain: none Does pain affect daily life?: no Are you currently prescribed opioids?: no  Dietary Habits and Nutritional Risks How many meals a day?: 3 Eats fruit and vegetables daily?: yes Most meals are obtained by: preparing own meals In the last 2 weeks, have you had any of the following?: none Diabetic:: no  Functional Status Activities of Daily Living (to include ambulation/medication): Independent Ambulation: Independent Medication Administration: Independent Home Management (perform basic housework or laundry): Independent Manage your own finances?: yes Primary transportation is: driving Concerns about vision?: no *vision screening is required for WTM* Concerns about hearing?: no  Fall Screening Falls in the past year?: 0 Number of falls in past year: 0 Was there an injury with  Fall?: 0 Fall Risk Category Calculator: 0 Patient Fall Risk Level: Low Fall Risk  Fall Risk Patient at Risk for Falls Due to: No Fall Risks Fall risk Follow up: Falls evaluation completed; Falls prevention discussed  Home and Transportation Safety: All rugs have non-skid backing?: yes All stairs or steps have railings?: yes Grab bars in the bathtub or shower?: (!) no Have non-skid surface in bathtub or shower?: yes Good home lighting?: yes Regular seat belt use?: yes Hospital stays in the last year:: no  Cognitive Assessment Difficulty concentrating, remembering, or making decisions? : no Will 6CIT or Mini Cog be Completed: no 6CIT or Mini Cog Declined: patient alert, oriented, able to answer questions appropriately and recall recent events Was the patient able to repeat memory words in 3 tries?: yes Which version was used?: Version1 : banana, sunrise, chair Clock numbers correct?: yes Clock time correct (11:10)?: yes Normal clock drawing test?: 2 How many words correct?: 3 Which version was used?: Version 1: banana, sunrise, chair Mini-Cog Scoring: 5  Advance Directives (For Healthcare) Does Patient Have a Medical Advance Directive?: Yes Type of Advance Directive: Healthcare Power of [place]; Living will Copy of Healthcare Power of Attorney in Chart?: No - copy requested Copy of Living Will in Chart?: No - copy requested  Reviewed/Updated  Reviewed/Updated: Reviewed All (Medical, Surgical, Family, Medications, Allergies, Care Teams, Patient Goals)    Allergies (verified) Bee venom   Current Medications (verified) Outpatient Encounter Medications as of 11/06/2024  Medication Sig   acetaminophen  (TYLENOL ) 325 MG tablet Take 2 tablets (650 mg total) by mouth every 8 (eight) hours as needed for mild pain.   amLODipine  (NORVASC ) 10 MG tablet Take 1 tablet (10 mg total) by mouth daily.   ascorbic acid  (VITAMIN C ) 1000 MG tablet Take by mouth.  aspirin  EC 81 MG tablet  Take 1 tablet (81 mg total) by mouth daily. Swallow whole.   celecoxib  (CELEBREX ) 200 MG capsule Take 1 capsule (200 mg total) by mouth 2 (two) times daily.   celecoxib  (CELEBREX ) 200 MG capsule Take 1 capsule (200 mg total) by mouth daily with food.   EPINEPHrine  0.3 mg/0.3 mL IJ SOAJ injection Inject 0.3 mg into the muscle as needed for anaphylaxis.   fexofenadine (ALLEGRA) 180 MG tablet Take 180 mg by mouth daily.   fluticasone  (FLONASE ) 50 MCG/ACT nasal spray Place 2 sprays into both nostrils daily.   neomycin -polymyxin-hydrocortisone (CORTISPORIN ) 3.5-10000-1 OTIC suspension Place 3 drops into the right ear 3 (three) times daily. Use up to 10 days   Omega-3 Fatty Acids (FISH OIL) 1200 MG CAPS Take 2,400 mg by mouth 2 (two) times daily.   omeprazole  (PRILOSEC) 20 MG capsule Take 1 capsule (20 mg total) by mouth daily.   predniSONE  (DELTASONE ) 10 MG tablet Take 3 tablets by mouth daily x 3 days then 2 tablets by mouth x 3 days then 1 tablet x 3 days.  Take w/ food.   rosuvastatin  (CRESTOR ) 40 MG tablet Take 1 tablet (40 mg total) by mouth daily.   valACYclovir  (VALTREX ) 1000 MG tablet Take 1 tablet (1,000 mg total) by mouth 2 (two) times daily. At the sight of a cold sore   No facility-administered encounter medications on file as of 11/06/2024.    History: Past Medical History:  Diagnosis Date   Allergy    CAD (coronary artery disease)    mild non-obstructive   GERD (gastroesophageal reflux disease)    Hyperlipidemia    S/P right rotator cuff repair    Sinusitis, bacterial    Urinary incontinence    Past Surgical History:  Procedure Laterality Date   COLONOSCOPY  04/08/2009   LEFT HEART CATHETERIZATION WITH CORONARY ANGIOGRAM N/A 08/13/2014   Procedure: LEFT HEART CATHETERIZATION WITH CORONARY ANGIOGRAM;  Surgeon: Lonni JONETTA Cash, MD;  Location: [facility] CATH LAB;  Service: Cardiovascular;  Laterality: N/A;   LUMBAR LAMINECTOMY FOR EPIDURAL ABSCESS N/A 08/30/2023   Procedure:  LUMBAR LAMINECTOMY LUMBAR THREE-LUMBAR FOUR, LUMBAR FOUR-LUMBAR FIVE WITH EVACUATION OF EPIDURAL ABSCESS;  Surgeon: Cheryle Debby LABOR, MD;  Location: [facility] OR;  Service: Neurosurgery;  Laterality: N/A;   SHOULDER ARTHROSCOPY WITH ROTATOR CUFF REPAIR Right 10/26/2017   Procedure: RIGHT SHOULDER ARTHROSCOPY WITH ROTATOR CUFF REPAIR AND DEBRIDEMENT;  Surgeon: Gerome Charleston, MD;  [facility];  Service: Orthopedics;  Laterality: Right;   SHOULDER SURGERY     Right shoulder    TEE WITHOUT CARDIOVERSION N/A 09/04/2023   Procedure: TRANSESOPHAGEAL ECHOCARDIOGRAM;  Surgeon: Okey Vina GAILS, MD;  Location: [facility] INVASIVE CV LAB;  Service: Cardiovascular;  Laterality: N/A;   Family History  Problem Relation Age of Onset   Heart disease Father        MI age 4   Colon cancer Neg Hx    Esophageal cancer Neg Hx    Rectal cancer Neg Hx    Stomach cancer Neg Hx    Colon polyps Neg Hx    Sleep apnea Neg Hx    Social History   Occupational History   Occupation: Museum/gallery Curator: [name]  Tobacco Use   Smoking status: Never   Smokeless tobacco: Never  Vaping Use   Vaping status: Never Used  Substance and Sexual Activity   Alcohol use: Yes    Alcohol/week: 4.0 standard drinks of alcohol  Types: 4 Standard drinks or equivalent per week    Comment: occ   Drug use: No   Sexual activity: Not on file   Tobacco Counseling Counseling given: Not Answered  SDOH Screenings   Food Insecurity: No Food Insecurity (11/06/2024)  Housing: Unknown (11/06/2024)  Transportation Needs: No Transportation Needs (11/06/2024)  Utilities: Not At Risk (11/06/2024)  Depression (PHQ2-9): Low Risk (11/06/2024)  Physical Activity: Inactive (11/06/2024)  Social Connections: Moderately Isolated (11/06/2024)  Stress: No Stress Concern Present (11/06/2024)  Tobacco Use: Low Risk (11/06/2024)  Health Literacy: Adequate Health Literacy (11/06/2024)   See flowsheets for full  screening details  Depression Screen PHQ 2 & 9 Depression Scale- Over the past 2 weeks, how often have you been bothered by any of the following problems? Little interest or pleasure in doing things: 0 Feeling down, depressed, or hopeless (PHQ Adolescent also includes...irritable): 0 PHQ-2 Total Score: 0 Trouble falling or staying asleep, or sleeping too much: 0 Feeling tired or having little energy: 0 Poor appetite or overeating (PHQ Adolescent also includes...weight loss): 0 Feeling bad about yourself - or that you are a failure or have let yourself or your family down: 0 Trouble concentrating on things, such as reading the newspaper or watching television (PHQ Adolescent also includes...like school work): 0 Moving or speaking so slowly that other people could have noticed. Or the opposite - being so fidgety or restless that you have been moving around a lot more than usual: 0 Thoughts that you would be better off dead, or of hurting yourself in some way: 0 PHQ-9 Total Score: 0 If you checked off any problems, how difficult have these problems made it for you to do your work, take care of things at home, or get along with other people?: Not difficult at all  Depression Treatment Depression Interventions/Treatment : EYV7-0 Score <4 Follow-up Not Indicated     Goals Addressed             This Visit's Progress    Patient Stated       To stay healthy/2025             Objective:    Today's Vitals   11/06/24 0852  Weight: 201 lb (91.2 kg)  Height: 5' 10 (1.778 m)   Body mass index is 28.84 kg/m.  Hearing/Vision screen Hearing Screening - Comments:: Denies hearing difficulties   Vision Screening - Comments:: Denies vision issues. /not UTD/ no provider Immunizations and Health Maintenance Health Maintenance  Topic Date Due   Zoster Vaccines- Shingrix (1 of 2) Never done   Influenza Vaccine  06/28/2024   COVID-19 Vaccine (4 - 2025-26 season) 07/29/2024    Pneumococcal Vaccine: 50+ Years (1 of 2 - PCV) 07/12/2025 (Originally 07/23/1977)   Medicare Annual Wellness (AWV)  11/06/2025   Colonoscopy  05/26/2029   DTaP/Tdap/Td (4 - Td or Tdap) 07/25/2032   Hepatitis C Screening  Completed   Meningococcal B Vaccine  Aged Out        Assessment/Plan:  This is a routine wellness examination for Bj's.  Patient Care Team: Mahlon Comer BRAVO, MD as PCP - General (Family Medicine) Verlin Lonni BIRCH, MD as PCP - Cardiology (Cardiology) Lelon Glendia ONEIDA DEVONNA as Physician Assistant (Physician Assistant) Verlin Lonni BIRCH, MD as Consulting Physician (Cardiology)  I have personally reviewed and noted the following in the patients chart:   Medical and social history Use of alcohol, tobacco or illicit drugs  Current medications and supplements including opioid prescriptions. Functional ability  and status Nutritional status Physical activity Advanced directives List of other physicians Hospitalizations, surgeries, and ER visits in previous 12 months Vitals Screenings to include cognitive, depression, and falls Referrals and appointments  No orders of the defined types were placed in this encounter.  In addition, I have reviewed and discussed with patient certain preventive protocols, quality metrics, and best practice recommendations. A written personalized care plan for preventive services as well as general preventive health recommendations were provided to patient.   Chanita Boden L Elyjah Hazan, CMA   11/06/2024  Return in 1 year (on 11/06/2025).  After Visit Summary: (MyChart) Due to this being a telephonic visit, the after visit summary with patients personalized plan was offered to patient via MyChart   Nurse Notes: Patient is due for a flu vaccine and would like to get it during his up coming visit.  He declines all other due vaccines.  Patient had no other concerns to address today.

## 2024-11-22 ENCOUNTER — Ambulatory Visit (INDEPENDENT_AMBULATORY_CARE_PROVIDER_SITE_OTHER): Admitting: Family Medicine

## 2024-11-22 ENCOUNTER — Encounter: Payer: Self-pay | Admitting: Family Medicine

## 2024-11-22 VITALS — BP 126/74 | HR 80 | Temp 98.3°F | Resp 16 | Ht 70.0 in | Wt 202.0 lb

## 2024-11-22 DIAGNOSIS — I10 Essential (primary) hypertension: Secondary | ICD-10-CM

## 2024-11-22 DIAGNOSIS — Z125 Encounter for screening for malignant neoplasm of prostate: Secondary | ICD-10-CM

## 2024-11-22 DIAGNOSIS — Z0000 Encounter for general adult medical examination without abnormal findings: Secondary | ICD-10-CM | POA: Diagnosis not present

## 2024-11-22 DIAGNOSIS — M4656 Other infective spondylopathies, lumbar region: Secondary | ICD-10-CM

## 2024-11-22 LAB — HEPATIC FUNCTION PANEL
ALT: 43 U/L (ref 3–53)
AST: 31 U/L (ref 5–37)
Albumin: 4.8 g/dL (ref 3.5–5.2)
Alkaline Phosphatase: 54 U/L (ref 39–117)
Bilirubin, Direct: 0.1 mg/dL (ref 0.1–0.3)
Total Bilirubin: 0.4 mg/dL (ref 0.2–1.2)
Total Protein: 7 g/dL (ref 6.0–8.3)

## 2024-11-22 LAB — BASIC METABOLIC PANEL WITH GFR
BUN: 16 mg/dL (ref 6–23)
CO2: 28 meq/L (ref 19–32)
Calcium: 9.3 mg/dL (ref 8.4–10.5)
Chloride: 103 meq/L (ref 96–112)
Creatinine, Ser: 1.03 mg/dL (ref 0.40–1.50)
GFR: 75.79 mL/min
Glucose, Bld: 98 mg/dL (ref 70–99)
Potassium: 4.2 meq/L (ref 3.5–5.1)
Sodium: 142 meq/L (ref 135–145)

## 2024-11-22 LAB — LIPID PANEL
Cholesterol: 142 mg/dL (ref 28–200)
HDL: 61.6 mg/dL
LDL Cholesterol: 46 mg/dL (ref 10–99)
NonHDL: 80.17
Total CHOL/HDL Ratio: 2
Triglycerides: 170 mg/dL — ABNORMAL HIGH (ref 10.0–149.0)
VLDL: 34 mg/dL (ref 0.0–40.0)

## 2024-11-22 LAB — CBC WITH DIFFERENTIAL/PLATELET
Basophils Absolute: 0.1 K/uL (ref 0.0–0.1)
Basophils Relative: 1.2 % (ref 0.0–3.0)
Eosinophils Absolute: 0.2 K/uL (ref 0.0–0.7)
Eosinophils Relative: 4 % (ref 0.0–5.0)
HCT: 42.3 % (ref 39.0–52.0)
Hemoglobin: 14.5 g/dL (ref 13.0–17.0)
Lymphocytes Relative: 35.1 % (ref 12.0–46.0)
Lymphs Abs: 1.6 K/uL (ref 0.7–4.0)
MCHC: 34.2 g/dL (ref 30.0–36.0)
MCV: 85.8 fl (ref 78.0–100.0)
Monocytes Absolute: 0.5 K/uL (ref 0.1–1.0)
Monocytes Relative: 12.1 % — ABNORMAL HIGH (ref 3.0–12.0)
Neutro Abs: 2.1 K/uL (ref 1.4–7.7)
Neutrophils Relative %: 47.6 % (ref 43.0–77.0)
Platelets: 255 K/uL (ref 150.0–400.0)
RBC: 4.93 Mil/uL (ref 4.22–5.81)
RDW: 13.4 % (ref 11.5–15.5)
WBC: 4.5 K/uL (ref 4.0–10.5)

## 2024-11-22 LAB — C-REACTIVE PROTEIN: CRP: <0.5 mg/dL — ABNORMAL LOW (ref 1.0–20.0)

## 2024-11-22 LAB — SEDIMENTATION RATE: Sed Rate: 1 mm/h (ref 0–20)

## 2024-11-22 LAB — TSH: TSH: 1.78 u[IU]/mL (ref 0.35–5.50)

## 2024-11-22 LAB — PSA, MEDICARE: PSA: 2.55 ng/mL (ref 0.10–4.00)

## 2024-11-22 NOTE — Progress Notes (Signed)
" ° °  Subjective:    Patient ID: Dan Sanchez, male    DOB: October 23, 1956, 68 y.o.   MRN: [id number]  HPI CPE- UTD on colonoscopy, Tdap.  Patient Care Team    Relationship Specialty Notifications Start End  Mahlon Comer BRAVO, MD PCP - General Family Medicine  09/18/23   Verlin Lonni BIRCH, MD PCP - Cardiology Cardiology Admissions 07/16/18   Lelon Glendia ONEIDA DEVONNA Physician Assistant Physician Assistant  04/06/12   Verlin Lonni BIRCH, MD Consulting Physician Cardiology  07/15/15     Health Maintenance  Topic Date Due   COVID-19 Vaccine (4 - 2025-26 season) 12/08/2024 (Originally 07/29/2024)   Zoster Vaccines- Shingrix (1 of 2) 02/20/2025 (Originally 07/23/2008)   Influenza Vaccine  02/25/2025 (Originally 06/28/2024)   Pneumococcal Vaccine: 50+ Years (1 of 2 - PCV) 07/12/2025 (Originally 07/23/1977)   Medicare Annual Wellness (AWV)  11/06/2025   Colonoscopy  05/26/2029   DTaP/Tdap/Td (4 - Td or Tdap) 07/25/2032   Hepatitis C Screening  Completed   Meningococcal B Vaccine  Aged Out      Review of Systems Patient reports no vision/hearing changes, anorexia, fever ,adenopathy, persistant/recurrent hoarseness, swallowing issues, chest pain, palpitations, edema, persistant/recurrent cough, hemoptysis, dyspnea (rest,exertional, paroxysmal nocturnal), gastrointestinal  bleeding (melena, rectal bleeding), abdominal pain, excessive heart burn, GU symptoms (dysuria, hematuria, voiding/incontinence issues) syncope, focal weakness, memory loss, numbness & tingling, skin/hair/nail changes, depression, anxiety, abnormal bruising/bleeding, musculoskeletal symptoms/signs.     Objective:   Physical Exam General Appearance:    Alert, cooperative, no distress, appears stated age  Head:    Normocephalic, without obvious abnormality, atraumatic  Eyes:    PERRL, conjunctiva/corneas clear, EOM's intact both eyes       Ears:    Normal TM's and external ear canals, both ears  Nose:   Nares normal, septum  midline, mucosa normal, no drainage   or sinus tenderness  Throat:   Lips, mucosa, and tongue normal; teeth and gums normal  Neck:   Supple, symmetrical, trachea midline, no adenopathy;       thyroid :  No enlargement/tenderness/nodules  Back:     Symmetric, no curvature, ROM normal, no CVA tenderness  Lungs:     Clear to auscultation bilaterally, respirations unlabored  Chest wall:    No tenderness or deformity  Heart:    Regular rate and rhythm, S1 and S2 normal, no murmur, rub   or gallop  Abdomen:     Soft, non-tender, bowel sounds active all four quadrants,    no masses, no organomegaly  Genitalia:    deferred  Rectal:    Extremities:   Extremities normal, atraumatic, no cyanosis or edema  Pulses:   2+ and symmetric all extremities  Skin:   Skin color, texture, turgor normal, no rashes or lesions  Lymph nodes:   Cervical, supraclavicular, and axillary nodes normal  Neurologic:   CNII-XII intact. Normal strength, sensation and reflexes      throughout          Assessment & Plan:    "

## 2024-11-22 NOTE — Patient Instructions (Signed)
 Follow up in 6 months to recheck blood pressure and cholesterol We'll notify you of your lab results and make any changes if needed Continue to work on healthy diet and regular exercise- you look great! Call with any questions or concerns Stay Safe!  Stay Healthy! Happy New Year!!!

## 2024-11-22 NOTE — Assessment & Plan Note (Signed)
 [name] PE WNL.  UTD on colonoscopy, Tdap.  Declines flu, PNA, shingles.  Check labs.  Anticipatory guidance provided.

## 2024-11-26 ENCOUNTER — Ambulatory Visit: Payer: Self-pay | Admitting: Family Medicine

## 2024-11-27 ENCOUNTER — Other Ambulatory Visit (HOSPITAL_BASED_OUTPATIENT_CLINIC_OR_DEPARTMENT_OTHER): Payer: Self-pay

## 2024-11-27 ENCOUNTER — Ambulatory Visit: Admitting: Family Medicine

## 2024-11-27 ENCOUNTER — Encounter: Payer: Self-pay | Admitting: Family Medicine

## 2024-11-27 VITALS — BP 126/72 | HR 76 | Temp 98.9°F | Ht 70.5 in | Wt 197.0 lb

## 2024-11-27 DIAGNOSIS — J101 Influenza due to other identified influenza virus with other respiratory manifestations: Secondary | ICD-10-CM

## 2024-11-27 DIAGNOSIS — R059 Cough, unspecified: Secondary | ICD-10-CM | POA: Diagnosis not present

## 2024-11-27 DIAGNOSIS — R519 Headache, unspecified: Secondary | ICD-10-CM | POA: Diagnosis not present

## 2024-11-27 LAB — POCT INFLUENZA A/B: Influenza A, POC: POSITIVE — AB

## 2024-11-27 MED ORDER — PROMETHAZINE-DM 6.25-15 MG/5ML PO SYRP
5.0000 mL | ORAL_SOLUTION | Freq: Four times a day (QID) | ORAL | 0 refills | Status: AC | PRN
  Filled 2024-11-27: qty 180, 9d supply, fill #0

## 2024-11-27 MED ORDER — OSELTAMIVIR PHOSPHATE 75 MG PO CAPS
75.0000 mg | ORAL_CAPSULE | Freq: Two times a day (BID) | ORAL | 0 refills | Status: AC
  Filled 2024-11-27: qty 10, 5d supply, fill #0

## 2024-11-27 NOTE — Progress Notes (Signed)
" ° °  Subjective:    Patient ID: Dan Sanchez, male    DOB: 06-26-1956, 68 y.o.   MRN: [id number]  HPI Fever- pt reports sxs started suddenly Monday w/ headache, body aches, fever.  Wet cough- causes chest to hurt.     Review of Systems For ROS see HPI     Objective:   Physical Exam Vitals reviewed.  Constitutional:      General: He is not in acute distress.    Appearance: Normal appearance. He is well-developed. He is not ill-appearing.  HENT:     Head: Normocephalic and atraumatic.     Right Ear: Tympanic membrane normal.     Left Ear: Tympanic membrane normal.     Nose:     Right Sinus: No maxillary sinus tenderness or frontal sinus tenderness.     Left Sinus: No maxillary sinus tenderness or frontal sinus tenderness.     Mouth/Throat:     Pharynx: No oropharyngeal exudate.  Cardiovascular:     Rate and Rhythm: Normal rate and regular rhythm.     Heart sounds: Normal heart sounds.  Pulmonary:     Effort: Pulmonary effort is normal. No respiratory distress.     Breath sounds: Normal breath sounds. No wheezing or rales.  Musculoskeletal:     Cervical back: Normal range of motion and neck supple.  Lymphadenopathy:     Cervical: No cervical adenopathy.  Skin:    General: Skin is warm.     Findings: No rash.  Neurological:     General: No focal deficit present.     Mental Status: He is alert and oriented to person, place, and time.  Psychiatric:        Mood and Affect: Mood normal.        Behavior: Behavior normal.        Thought Content: Thought content normal.           Assessment & Plan:  Influenza A- new.  [name] sxs are consistent w/ dx and rapid test confirms.  Start Tamiflu.  Cough meds prn.  Reviewed supportive care and red flags that should prompt return.  Pt expressed understanding and is in agreement w/ plan.   "

## 2024-11-27 NOTE — Patient Instructions (Signed)
 Follow up as needed or as scheduled START the Tamiflu twice daily Drink LOTS of fluids REST! Use the cough syrup as needed You can continue Dayquil/Nyquil as needed for symptoms Ibuprofen for body aches/headache or fever Call with any questions or concerns Hang in there! Happy New Year!

## 2024-12-01 ENCOUNTER — Encounter: Payer: Self-pay | Admitting: Family Medicine

## 2024-12-02 ENCOUNTER — Other Ambulatory Visit (HOSPITAL_BASED_OUTPATIENT_CLINIC_OR_DEPARTMENT_OTHER): Payer: Self-pay

## 2024-12-02 MED ORDER — CELECOXIB 200 MG PO CAPS
200.0000 mg | ORAL_CAPSULE | Freq: Every day | ORAL | 2 refills | Status: AC
  Filled 2024-12-02: qty 30, 30d supply, fill #0
  Filled 2024-12-31: qty 30, 30d supply, fill #1

## 2024-12-02 NOTE — Telephone Encounter (Signed)
 Patient was seen 11/27/24 for influenza A. Patient message in about night sweats.

## 2025-04-03 ENCOUNTER — Ambulatory Visit: Admitting: Cardiovascular Disease

## 2025-05-23 ENCOUNTER — Ambulatory Visit: Admitting: Family Medicine
